# Patient Record
Sex: Female | Born: 1955 | Race: Black or African American | Hispanic: No | Marital: Married | Smoking: Never smoker
Health system: Southern US, Community
[De-identification: ages and names within clinical notes are randomized; demographics above are authoritative.]

## PROBLEM LIST (undated history)

## (undated) ENCOUNTER — Emergency Department (HOSPITAL_COMMUNITY): Disposition: A | Discharge status: Home / Self Care | Payer: Medicare Other

## (undated) ENCOUNTER — Emergency Department (HOSPITAL_COMMUNITY): Admission: EM | Discharge status: Absent without leave | Payer: Medicare Other

## (undated) DIAGNOSIS — F1010 Alcohol abuse, uncomplicated: Secondary | ICD-10-CM

## (undated) DIAGNOSIS — E785 Hyperlipidemia, unspecified: Secondary | ICD-10-CM

## (undated) DIAGNOSIS — E039 Hypothyroidism, unspecified: Secondary | ICD-10-CM

## (undated) DIAGNOSIS — I10 Essential (primary) hypertension: Secondary | ICD-10-CM

## (undated) HISTORY — PX: ABDOMINAL HYSTERECTOMY: SHX81

---
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
## 2001-02-03 ENCOUNTER — Encounter: Payer: Self-pay | Admitting: *Deleted

## 2001-02-03 ENCOUNTER — Emergency Department (HOSPITAL_COMMUNITY): Admission: EM | Admit: 2001-02-03 | Discharge: 2001-02-03 | Payer: Self-pay | Admitting: *Deleted

## 2001-05-09 ENCOUNTER — Emergency Department (HOSPITAL_COMMUNITY): Admission: EM | Admit: 2001-05-09 | Discharge: 2001-05-09 | Payer: Self-pay | Admitting: Emergency Medicine

## 2001-06-07 ENCOUNTER — Encounter: Payer: Self-pay | Admitting: Emergency Medicine

## 2001-06-07 ENCOUNTER — Emergency Department (HOSPITAL_COMMUNITY): Admission: EM | Admit: 2001-06-07 | Discharge: 2001-06-07 | Payer: Self-pay | Admitting: Emergency Medicine

## 2003-10-15 ENCOUNTER — Emergency Department (HOSPITAL_COMMUNITY): Admission: EM | Admit: 2003-10-15 | Discharge: 2003-10-15 | Payer: Self-pay | Admitting: Emergency Medicine

## 2005-07-04 ENCOUNTER — Emergency Department (HOSPITAL_COMMUNITY): Admission: EM | Admit: 2005-07-04 | Discharge: 2005-07-05 | Payer: Self-pay | Admitting: Emergency Medicine

## 2006-05-10 ENCOUNTER — Emergency Department (HOSPITAL_COMMUNITY): Admission: EM | Admit: 2006-05-10 | Discharge: 2006-05-10 | Payer: Self-pay | Admitting: Emergency Medicine

## 2006-05-18 ENCOUNTER — Emergency Department (HOSPITAL_COMMUNITY): Admission: EM | Admit: 2006-05-18 | Discharge: 2006-05-18 | Payer: Self-pay | Admitting: Emergency Medicine

## 2006-09-24 ENCOUNTER — Emergency Department (HOSPITAL_COMMUNITY): Admission: EM | Admit: 2006-09-24 | Discharge: 2006-09-24 | Payer: Self-pay | Admitting: Emergency Medicine

## 2007-05-09 ENCOUNTER — Emergency Department (HOSPITAL_COMMUNITY): Admission: EM | Admit: 2007-05-09 | Discharge: 2007-05-09 | Payer: Self-pay | Admitting: Emergency Medicine

## 2007-12-25 ENCOUNTER — Emergency Department (HOSPITAL_COMMUNITY): Admission: EM | Admit: 2007-12-25 | Discharge: 2007-12-25 | Payer: Self-pay | Admitting: Emergency Medicine

## 2007-12-25 ENCOUNTER — Ambulatory Visit: Payer: Self-pay | Admitting: Gastroenterology

## 2010-12-12 ENCOUNTER — Emergency Department (HOSPITAL_COMMUNITY): Payer: Self-pay

## 2010-12-12 ENCOUNTER — Emergency Department (HOSPITAL_COMMUNITY)
Admission: EM | Admit: 2010-12-12 | Discharge: 2010-12-12 | Disposition: A | Discharge status: Home / Self Care | Payer: Self-pay | Attending: Emergency Medicine | Admitting: Emergency Medicine

## 2010-12-12 DIAGNOSIS — K2900 Acute gastritis without bleeding: Secondary | ICD-10-CM | POA: Insufficient documentation

## 2010-12-12 DIAGNOSIS — R109 Unspecified abdominal pain: Secondary | ICD-10-CM | POA: Insufficient documentation

## 2010-12-12 LAB — DIFFERENTIAL
Basophils Absolute: 0 10*3/uL (ref 0.0–0.1)
Eosinophils Absolute: 0.1 10*3/uL (ref 0.0–0.7)
Eosinophils Relative: 2 % (ref 0–5)
Monocytes Absolute: 0.3 10*3/uL (ref 0.1–1.0)

## 2010-12-12 LAB — COMPREHENSIVE METABOLIC PANEL
ALT: 14 U/L (ref 0–35)
CO2: 25 mEq/L (ref 19–32)
Calcium: 9.2 mg/dL (ref 8.4–10.5)
Creatinine, Ser: 0.73 mg/dL (ref 0.4–1.2)
GFR calc Af Amer: >60 mL/min (ref >60)
GFR calc non Af Amer: >60 mL/min (ref >60)
Glucose, Bld: 98 mg/dL (ref 70–99)
Sodium: 141 mEq/L (ref 135–145)
Total Protein: 7.7 g/dL (ref 6.0–8.3)

## 2010-12-12 LAB — CBC
MCHC: 32.6 g/dL (ref 30.0–36.0)
MCV: 91.4 fL (ref 78.0–100.0)
Platelets: 387 10*3/uL (ref 150–400)
RDW: 13.9 % (ref 11.5–15.5)
WBC: 4.9 10*3/uL (ref 4.0–10.5)

## 2010-12-12 LAB — LIPASE, BLOOD: Lipase: 18 U/L (ref 11–59)

## 2011-01-28 NOTE — Consult Note (Signed)
Judy Davis, Judy Davis             ACCOUNT NO.:  [id number]   MEDICAL RECORD NO.:  [id number]          PATIENT TYPE:  EMS   LOCATION:  ED                            FACILITY:  [facility]   PHYSICIAN:  Kassie Mends, M.D.      DATE OF BIRTH:  [birth year]   DATE OF CONSULTATION:  12/25/2007  DATE OF DISCHARGE:  12/25/2007                                 CONSULTATION   REFERRING PHYSICIAN:  Nicoletta Dress. Colon Branch, M.D.   REASON FOR CONSULTATION:  Blood coming out of the mouth.   HISTORY OF PRESENT ILLNESS:  Judy Davis is a 55 year old female who  has a significant past medical history of alcohol abuse.  She called EMS  because of blood was coming out of her nose and her mouth.  She told Dr.  Colon Branch that the bleeding began this afternoon.  She told me it began  this morning.  She told me that her husband called 9-1-1, but her  husband told me that he she called 9-1-1.  He said he left her round 2  p.m. and she was fine.  She states her last drink of alcohol was 2 days  ago and she only had 1 beer.  She denies any problems swallowing,  nausea, or vomiting.  She has had no abdominal pain.  She states she  does not use aspirin, BCs or Goody Powders.  She does not use ibuprofen,  Motrin or Aleve.  Her husband states she uses Alka-Seltzer two a day.  She has not had any black stools that looks tar or rectal bleeding or  diarrhea or constipation.   PAST MEDICAL HISTORY:  1. Hypertension.  2. Seizure disorder.   PAST SURGICAL HISTORY:  None.   ALLERGIES:  No known drug allergies.   MEDICATIONS:  None.   FAMILY HISTORY:  She denies any family history of colon cancer or colon  polyps.   SOCIAL HISTORY:  She is married to her husband for 5 years.  She  regularly consumes alcohol.   REVIEW OF SYSTEMS:  Per the HPI; otherwise all systems negative.   PHYSICAL EXAMINATION:  VITAL SIGNS:  Afebrile and stable.  GENERAL:  She is in no apparent distress, appears intoxicated.  HEENT:  She has had prior  trauma to her head.  She has blood in her nares and  around her mouth.  Her pupils are equal, react to light.  Her sclerae  are injected bilaterally.  Has no oral lesions.  She has poor dental  caries.  NECK:  Full range of motion.  No lymphadenopathy.  LUNGS:  Clear to auscultation bilaterally.  HEART:  Regular rhythm, no murmur,  normal S1-S2.  ABDOMEN:  Bowel sounds are present.  Soft, nontender,  nondistended.  No rebound or guarding.  EXTREMITIES:  Have no cyanosis  or edema.  NEUROLOGIC:  She has neurologic deficits.   LABORATORY DATA:  Hemoglobin 12.6, platelets 188, INR 1.2, BUN 5,  creatinine 0.54.  AST and ALT elevated.   ASSESSMENT:  Judy Davis is a 55 year old female who appears to have  epistaxis.  Her history is somewhat  unreliable because she appears  intoxicated.  Thank you for allowing me to see Ms. [facility]  consultation.  My recommendations follow.   RECOMMENDATIONS:  1. No acute need for upper endoscopy.  Her NG lavage showed no      evidence of bright red blood in her stomach.  2. Could consider an EGD next week just to clear her upper GI tract.  3. Her liver enzymes are likely secondary to alcohol use.      Kassie Mends, M.D.  Electronically Signed     SM/MEDQ  D:  12/25/2007  T:  12/26/2007  Job:  [id number]

## 2011-06-10 LAB — APTT: aPTT: 30

## 2011-06-10 LAB — CBC
Platelets: 188
RDW: 15.3
WBC: 5.6

## 2011-06-10 LAB — RAPID URINE DRUG SCREEN, HOSP PERFORMED
Barbiturates: NOT DETECTED
Benzodiazepines: NOT DETECTED
Opiates: NOT DETECTED

## 2011-06-10 LAB — DIFFERENTIAL
Basophils Absolute: 0
Eosinophils Relative: 2
Lymphocytes Relative: 58 — ABNORMAL HIGH
Lymphs Abs: 3.3
Neutro Abs: 1.9
Neutrophils Relative %: 34 — ABNORMAL LOW

## 2011-06-10 LAB — URINALYSIS, ROUTINE W REFLEX MICROSCOPIC
Bilirubin Urine: NEGATIVE
Glucose, UA: NEGATIVE
Hgb urine dipstick: NEGATIVE
Protein, ur: NEGATIVE
Specific Gravity, Urine: <1.005 — ABNORMAL LOW

## 2011-06-10 LAB — ETHANOL: Alcohol, Ethyl (B): 439

## 2011-06-10 LAB — BASIC METABOLIC PANEL
BUN: 5 — ABNORMAL LOW
Calcium: 9.5
Creatinine, Ser: 0.54
GFR calc non Af Amer: >60
Glucose, Bld: 105 — ABNORMAL HIGH
Potassium: 3.5

## 2011-06-10 LAB — HEPATIC FUNCTION PANEL
ALT: 134 — ABNORMAL HIGH
AST: 419 — ABNORMAL HIGH
Albumin: 3.9
Alkaline Phosphatase: 69
Indirect Bilirubin: 0.4
Total Protein: 7.4

## 2011-06-10 LAB — TYPE AND SCREEN: Antibody Screen: NEGATIVE

## 2011-06-17 ENCOUNTER — Other Ambulatory Visit (HOSPITAL_COMMUNITY): Payer: Self-pay | Admitting: Family Medicine

## 2011-06-17 DIAGNOSIS — Z139 Encounter for screening, unspecified: Secondary | ICD-10-CM

## 2011-06-24 ENCOUNTER — Ambulatory Visit (HOSPITAL_COMMUNITY)
Admission: RE | Admit: 2011-06-24 | Discharge: 2011-06-24 | Disposition: A | Discharge status: Home / Self Care | Payer: PRIVATE HEALTH INSURANCE | Source: Ambulatory Visit | Attending: Family Medicine | Admitting: Family Medicine

## 2011-06-24 DIAGNOSIS — Z1231 Encounter for screening mammogram for malignant neoplasm of breast: Secondary | ICD-10-CM | POA: Insufficient documentation

## 2011-06-24 DIAGNOSIS — Z139 Encounter for screening, unspecified: Secondary | ICD-10-CM

## 2011-06-27 LAB — DIFFERENTIAL
Basophils Absolute: 0
Eosinophils Relative: 0
Lymphocytes Relative: 12
Neutro Abs: 4.2
Neutrophils Relative %: 81 — ABNORMAL HIGH

## 2011-06-27 LAB — BASIC METABOLIC PANEL
BUN: 4 — ABNORMAL LOW
Calcium: 9.5
Creatinine, Ser: 0.8
GFR calc non Af Amer: >60
Glucose, Bld: 182 — ABNORMAL HIGH

## 2011-06-27 LAB — COMPREHENSIVE METABOLIC PANEL
Albumin: 4.3
Alkaline Phosphatase: 83
BUN: 4 — ABNORMAL LOW
Potassium: 3.3 — ABNORMAL LOW
Sodium: 128 — ABNORMAL LOW
Total Protein: 8.1

## 2011-06-27 LAB — URINE MICROSCOPIC-ADD ON

## 2011-06-27 LAB — URINALYSIS, ROUTINE W REFLEX MICROSCOPIC
Glucose, UA: NEGATIVE
Leukocytes, UA: NEGATIVE
pH: 6

## 2011-06-27 LAB — ETHANOL: Alcohol, Ethyl (B): <5

## 2011-06-27 LAB — CBC
Platelets: 293
RDW: 14.8 — ABNORMAL HIGH

## 2011-07-05 ENCOUNTER — Other Ambulatory Visit: Payer: Self-pay

## 2011-07-05 ENCOUNTER — Encounter: Payer: Self-pay | Admitting: *Deleted

## 2011-07-05 ENCOUNTER — Emergency Department (HOSPITAL_COMMUNITY): Payer: Self-pay

## 2011-07-05 ENCOUNTER — Inpatient Hospital Stay (HOSPITAL_COMMUNITY)
Admission: EM | Admit: 2011-07-05 | Discharge: 2011-07-08 | DRG: 392 | Disposition: A | Discharge status: Home / Self Care | Payer: Self-pay | Attending: Internal Medicine | Admitting: Internal Medicine

## 2011-07-05 DIAGNOSIS — K2900 Acute gastritis without bleeding: Secondary | ICD-10-CM | POA: Diagnosis present

## 2011-07-05 DIAGNOSIS — D649 Anemia, unspecified: Secondary | ICD-10-CM | POA: Diagnosis present

## 2011-07-05 DIAGNOSIS — R112 Nausea with vomiting, unspecified: Secondary | ICD-10-CM | POA: Diagnosis present

## 2011-07-05 DIAGNOSIS — E871 Hypo-osmolality and hyponatremia: Secondary | ICD-10-CM | POA: Diagnosis present

## 2011-07-05 DIAGNOSIS — J069 Acute upper respiratory infection, unspecified: Secondary | ICD-10-CM | POA: Diagnosis present

## 2011-07-05 DIAGNOSIS — E236 Other disorders of pituitary gland: Secondary | ICD-10-CM | POA: Diagnosis present

## 2011-07-05 DIAGNOSIS — A088 Other specified intestinal infections: Principal | ICD-10-CM | POA: Diagnosis present

## 2011-07-05 DIAGNOSIS — T373X5A Adverse effect of other antiprotozoal drugs, initial encounter: Secondary | ICD-10-CM | POA: Diagnosis present

## 2011-07-05 DIAGNOSIS — I10 Essential (primary) hypertension: Secondary | ICD-10-CM | POA: Diagnosis present

## 2011-07-05 DIAGNOSIS — N390 Urinary tract infection, site not specified: Secondary | ICD-10-CM | POA: Diagnosis present

## 2011-07-05 HISTORY — DX: Essential (primary) hypertension: I10

## 2011-07-05 LAB — COMPREHENSIVE METABOLIC PANEL
BUN: 11 mg/dL (ref 6–23)
CO2: 25 mEq/L (ref 19–32)
Calcium: 9.3 mg/dL (ref 8.4–10.5)
Creatinine, Ser: 0.81 mg/dL (ref 0.50–1.10)
GFR calc Af Amer: >90 mL/min (ref >90)
GFR calc non Af Amer: 80 mL/min — ABNORMAL LOW (ref >90)
Glucose, Bld: 115 mg/dL — ABNORMAL HIGH (ref 70–99)
Total Protein: 8 g/dL (ref 6.0–8.3)

## 2011-07-05 LAB — URINE MICROSCOPIC-ADD ON

## 2011-07-05 LAB — CBC
HCT: 35.1 % — ABNORMAL LOW (ref 36.0–46.0)
MCH: 30.7 pg (ref 26.0–34.0)
MCV: 92.1 fL (ref 78.0–100.0)
Platelets: 294 10*3/uL (ref 150–400)
RBC: 3.81 MIL/uL — ABNORMAL LOW (ref 3.87–5.11)
WBC: 5.6 10*3/uL (ref 4.0–10.5)

## 2011-07-05 LAB — URINALYSIS, ROUTINE W REFLEX MICROSCOPIC
Hgb urine dipstick: NEGATIVE
Leukocytes, UA: NEGATIVE
Nitrite: NEGATIVE
Protein, ur: 30 mg/dL — AB
Specific Gravity, Urine: >1.03 — ABNORMAL HIGH (ref 1.005–1.030)
Urobilinogen, UA: 0.2 mg/dL (ref 0.0–1.0)

## 2011-07-05 LAB — DIFFERENTIAL
Eosinophils Absolute: 0 10*3/uL (ref 0.0–0.7)
Eosinophils Relative: 0 % (ref 0–5)
Lymphocytes Relative: 24 % (ref 12–46)
Lymphs Abs: 1.3 10*3/uL (ref 0.7–4.0)
Monocytes Absolute: 0.8 10*3/uL (ref 0.1–1.0)

## 2011-07-05 LAB — LIPASE, BLOOD: Lipase: 24 U/L (ref 11–59)

## 2011-07-05 MED ORDER — ACETAMINOPHEN 650 MG RE SUPP: 650.0000 mg | Freq: Four times a day (QID) | RECTAL | Status: DC | PRN

## 2011-07-05 MED ORDER — PANTOPRAZOLE SODIUM 40 MG IV SOLR
40.0000 mg | INTRAVENOUS | Status: DC
  Administered 2011-07-06: 40 mg via INTRAVENOUS
  Filled 2011-07-05: qty 40

## 2011-07-05 MED ORDER — ENOXAPARIN SODIUM 40 MG/0.4ML ~~LOC~~ SOLN
40.0000 mg | SUBCUTANEOUS | Status: DC
  Administered 2011-07-06 – 2011-07-07 (×2): 40 mg via SUBCUTANEOUS
  Filled 2011-07-05 (×2): qty 0.4

## 2011-07-05 MED ORDER — KCL IN DEXTROSE-NACL 20-5-0.9 MEQ/L-%-% IV SOLN
INTRAVENOUS | Status: DC
  Administered 2011-07-05: 20:00:00 via INTRAVENOUS
  Administered 2011-07-06: 1000 mL via INTRAVENOUS
  Administered 2011-07-06: 12:00:00 via INTRAVENOUS
  Filled 2011-07-05 (×6): qty 1000

## 2011-07-05 MED ORDER — ONDANSETRON HCL 4 MG/2ML IJ SOLN
4.0000 mg | Freq: Once | INTRAMUSCULAR | Status: AC
  Administered 2011-07-05: 4 mg via INTRAVENOUS
  Filled 2011-07-05: qty 2

## 2011-07-05 MED ORDER — ALUM & MAG HYDROXIDE-SIMETH 200-200-20 MG/5ML PO SUSP: 30.0000 mL | Freq: Four times a day (QID) | ORAL | Status: DC | PRN

## 2011-07-05 MED ORDER — MORPHINE SULFATE 2 MG/ML IJ SOLN: 2.0000 mg | INTRAMUSCULAR | Status: DC | PRN

## 2011-07-05 MED ORDER — PROMETHAZINE HCL 25 MG/ML IJ SOLN: 12.5000 mg | Freq: Four times a day (QID) | INTRAMUSCULAR | Status: DC | PRN

## 2011-07-05 MED ORDER — ONDANSETRON HCL 4 MG PO TABS: 4.0000 mg | ORAL_TABLET | Freq: Four times a day (QID) | ORAL | Status: DC | PRN

## 2011-07-05 MED ORDER — GUAIFENESIN-DM 100-10 MG/5ML PO SYRP: 5.0000 mL | ORAL_SOLUTION | ORAL | Status: DC | PRN

## 2011-07-05 MED ORDER — POTASSIUM CHLORIDE 2 MEQ/ML IV SOLN: INTRAVENOUS | Status: DC

## 2011-07-05 MED ORDER — PROMETHAZINE HCL 12.5 MG PO TABS: 12.5000 mg | ORAL_TABLET | Freq: Four times a day (QID) | ORAL | Status: DC | PRN

## 2011-07-05 MED ORDER — SODIUM CHLORIDE 0.9 % IV BOLUS (SEPSIS)
500.0000 mL | Freq: Once | INTRAVENOUS | Status: AC
  Administered 2011-07-05: 1000 mL via INTRAVENOUS

## 2011-07-05 MED ORDER — ACETAMINOPHEN 325 MG PO TABS
650.0000 mg | ORAL_TABLET | Freq: Four times a day (QID) | ORAL | Status: DC | PRN
  Administered 2011-07-05 – 2011-07-06 (×2): 650 mg via ORAL
  Filled 2011-07-05 (×2): qty 2

## 2011-07-05 MED ORDER — ALBUTEROL SULFATE (5 MG/ML) 0.5% IN NEBU: 2.5000 mg | INHALATION_SOLUTION | RESPIRATORY_TRACT | Status: DC | PRN

## 2011-07-05 MED ORDER — ONDANSETRON HCL 4 MG/2ML IJ SOLN: 4.0000 mg | Freq: Four times a day (QID) | INTRAMUSCULAR | Status: DC | PRN

## 2011-07-05 MED ORDER — THIAMINE HCL 100 MG/ML IJ SOLN
100.0000 mg | Freq: Every day | INTRAMUSCULAR | Status: DC
  Administered 2011-07-06 – 2011-07-08 (×3): 100 mg via INTRAVENOUS
  Filled 2011-07-05 (×3): qty 2

## 2011-07-05 NOTE — ED Notes (Signed)
Dr. Sherrie Mustache here to evaluate pt for admission

## 2011-07-05 NOTE — ED Notes (Signed)
Pt c/o n/v and weakness x 3 days

## 2011-07-05 NOTE — ED Notes (Signed)
Floor unable to take report, will return call when able  

## 2011-07-05 NOTE — H&P (Signed)
Judy Davis MRN: [id number] DOB/AGE: November 23, 1955 55 y.o. Primary Care Physician:[facility]. Admit date: 07/05/2011 Chief Complaint: Nausea, vomiting, and productive cough. HPI: The patient is a 55 year old woman with a past medical history significant for hypertension, who presents to the emergency department with a chief complaint of nausea, vomiting, and a productive cough. Her symptoms started approximately 3-4 days ago. She began having nausea and vomiting. Over the past 24 hours, she estimates that she has vomited approximately 15 times. The nausea is sometimes preceded by a dry and intermittent productive cough with white sputum. She denies pleurisy and shortness of breath. She denies coffee grounds emesis. She denies seeing blood in her sputum. She had loose stools on 2 occasions, but no ongoing diarrhea. She denies black tarry stools and bright red blood per rectum. She did have mild left lower quadrant and pelvic  abdominal pain for which she was treated with metronidazole. Last week, she completed a course of metronidazole for what sounds to be a diagnosis of bacterial vaginosis.. She has had no further unusual vaginal discharge. She had a hysterectomy in the 1970s. She denies any vaginal bleeding. She denies pain with urination. She has had a headache globally and subjective fever and chills. She drinks beer 2-3 times a week, but none lately.  In the emergency department, she was initially febrile with a temperature of 100.5. Her temperature is now 98.6. She was mildly tachycardic, however, her heart rate has normalized. Her blood pressure is within normal limits. Her lab data are significant for a serum sodium of 124 and chloride of 90. Her white blood cell count is within normal limits at 5.6. Her hemoglobin is slightly low at 11.7. A urinalysis was not done. Her liver transaminases and lipase are within normal limits. Her chest x-ray reveals no acute cardiopulmonary  findings.  Past Medical History  Diagnosis Date  . HTN (hypertension)   . Bacterial vaginosis   . Alcohol use   . DUB (dysfunctional uterine bleeding) 1970's    S/p hysterectomy     Past Surgical History  Procedure Date  . Abdominal hysterectomy 1970's    Prior to Admission medications   Medication Sig Start Date End Date Taking? Authorizing Provider  ibuprofen (ADVIL,MOTRIN) 200 MG tablet Take 200-400 mg by mouth 2 (two) times daily as needed. For pain    Yes Historical Provider, MD  lisinopril-hydrochlorothiazide (PRINZIDE,ZESTORETIC) 20-25 MG per tablet Take 1 tablet by mouth daily.     Yes Historical Provider, MD    Allergies: No Known Allergies  Family history: Her mother was stabbed to death. Her father died of colon cancer.  Social History: She is married. She lives in [place]. She has 2 adult children. She does not work. She completed high school. She denies tobacco use. She denies illicit drug use. She drinks 40 ounces of beer 2-3 days weekly.   ROS: As above in the history of present illness, otherwise negative.  PHYSICAL EXAM: Blood pressure 132/92, pulse 81, temperature 100.5 F (38.1 C), temperature source Oral, resp. rate 18, height 5' 6.5" (1.689 m), weight 58.968 kg (130 lb), SpO2 98.00%. General: The patient is a 55 year old African American woman who is currently lying in bed in no acute distress. She does appear ill, however. HEENT: Head is normocephalic nontraumatic. Pupils are equal, round, and reactive to light. Extraocular movements are intact. Conjunctivae are mildly injected. Sclerae are muddy. Tympanic membranes not examined. Oropharynx reveals mildly dry mucous membranes. Multiple missing teeth. No posterior exudates  or erythema. Nasal mucosa is moist and she has active rhinorrhea, but no sinus tenderness. Neck: Supple, no adenopathy, no thyromegaly, no JVD. Lungs: Decreased breath sounds in the bases, otherwise clear. Breathing is  nonlabored. Heart: S1, S2, no murmurs rubs or gallops. Abdomen: Mildly obese, positive bowel sounds, very mild tenderness in the epigastrium, no rebound, no guarding, no hepatosplenomegaly. GU and rectal are deferred. Extremities: Pedal pulses palpable. No pedal edema. Neurologic/psychological: She is alert and oriented x3. She appears ill, but is cooperative. Her speech is clear. Cranial nerves II through XII are grossly intact. Strength is 5 over 5 globally. Strength is intact globally.  Basic Metabolic Panel:  Basename 07/05/11 1520  NA 124*  K 4.5  CL 90*  CO2 25  GLUCOSE 115*  BUN 11  CREATININE 0.81  CALCIUM 9.3  MG --  PHOS --   Liver Function Tests:  Basename 07/05/11 1520  AST 30  ALT 17  ALKPHOS 53  BILITOT 0.4  PROT 8.0  ALBUMIN 3.9    Basename 07/05/11 1520  LIPASE 24  AMYLASE --   No results found for this basename: AMMONIA:2 in the last 72 hours CBC:  Basename 07/05/11 1520  WBC 5.6  NEUTROABS 3.4  HGB 11.7*  HCT 35.1*  MCV 92.1  PLT 294   Cardiac Enzymes: No results found for this basename: CKTOTAL:3,CKMB:3,CKMBINDEX:3,TROPONINI:3 in the last 72 hours BNP: No results found for this basename: POCBNP:3 in the last 72 hours D-Dimer: No results found for this basename: DDIMER:2 in the last 72 hours CBG: No results found for this basename: GLUCAP:6 in the last 72 hours Hemoglobin A1C: No results found for this basename: HGBA1C in the last 72 hours Fasting Lipid Panel: No results found for this basename: CHOL,HDL,LDLCALC,TRIG,CHOLHDL,LDLDIRECT in the last 72 hours Thyroid Function Tests: No results found for this basename: TSH,T4TOTAL,FREET4,T3FREE,THYROIDAB in the last 72 hours Anemia Panel: No results found for this basename: VITAMINB12,FOLATE,FERRITIN,TIBC,IRON,RETICCTPCT in the last 72 hours Urine Drug Screen:  Alcohol Level: No results found for this basename: ETH:2 in the last 72 hours    EKG: Normal sinus rhythm with a heart rate  80 beats per minute.  No results found for this or any previous visit (from the past 240 hour(s)).   Results for orders placed during the hospital encounter of 07/05/11 (from the past 48 hour(s))  CBC     Status: Abnormal   Collection Time   07/05/11  3:20 PM      Component Value Range Comment   WBC 5.6  4.0 - 10.5 (K/uL)    RBC 3.81 (*) 3.87 - 5.11 (MIL/uL)    Hemoglobin 11.7 (*) 12.0 - 15.0 (g/dL)    HCT 21.3 (*) 08.6 - 46.0 (%)    MCV 92.1  78.0 - 100.0 (fL)    MCH 30.7  26.0 - 34.0 (pg)    MCHC 33.3  30.0 - 36.0 (g/dL)    RDW 57.8  46.9 - 62.9 (%)    Platelets 294  150 - 400 (K/uL)   DIFFERENTIAL     Status: Abnormal   Collection Time   07/05/11  3:20 PM      Component Value Range Comment   Neutrophils Relative 61  43 - 77 (%)    Neutro Abs 3.4  1.7 - 7.7 (K/uL)    Lymphocytes Relative 24  12 - 46 (%)    Lymphs Abs 1.3  0.7 - 4.0 (K/uL)    Monocytes Relative 15 (*) 3 - 12 (%)  Monocytes Absolute 0.8  0.1 - 1.0 (K/uL)    Eosinophils Relative 0  0 - 5 (%)    Eosinophils Absolute 0.0  0.0 - 0.7 (K/uL)    Basophils Relative 0  0 - 1 (%)    Basophils Absolute 0.0  0.0 - 0.1 (K/uL)   COMPREHENSIVE METABOLIC PANEL     Status: Abnormal   Collection Time   07/05/11  3:20 PM      Component Value Range Comment   Sodium 124 (*) 135 - 145 (mEq/L)    Potassium 4.5  3.5 - 5.1 (mEq/L)    Chloride 90 (*) 96 - 112 (mEq/L)    CO2 25  19 - 32 (mEq/L)    Glucose, Bld 115 (*) 70 - 99 (mg/dL)    BUN 11  6 - 23 (mg/dL)    Creatinine, Ser 1.61  0.50 - 1.10 (mg/dL)    Calcium 9.3  8.4 - 10.5 (mg/dL)    Total Protein 8.0  6.0 - 8.3 (g/dL)    Albumin 3.9  3.5 - 5.2 (g/dL)    AST 30  0 - 37 (U/L)    ALT 17  0 - 35 (U/L)    Alkaline Phosphatase 53  39 - 117 (U/L)    Total Bilirubin 0.4  0.3 - 1.2 (mg/dL)    GFR calc non Af Amer 80 (*) >90 (mL/min)    GFR calc Af Amer >90  >90 (mL/min)   LIPASE, BLOOD     Status: Normal   Collection Time   07/05/11  3:20 PM      Component Value Range  Comment   Lipase 24  11 - 59 (U/L)     Dg Chest 2 View  07/05/2011  *RADIOLOGY REPORT*  Clinical Data: Cough and congestion for 3 days.  CHEST - 2 VIEW  Comparison: None  Findings: The cardiac silhouette, mediastinal and hilar contours are within normal limits.  The lungs are clear.  No pleural effusion.  The bony thorax is intact.  Remote healed left-sided rib fractures are noted.  IMPRESSION: No acute cardiopulmonary findings.  Original Report Authenticated By: P. Loralie Champagne, M.D.    Impression:  Active Problems:  Nausea and vomiting  Hyponatremia  HTN (hypertension)  Anemia   1. Nausea and vomiting. Possibly secondary to an acute viral gastroenteritis. We'll also consider side effect from metronidazole. Also consider acute gastritis given that she was recently started on ibuprofen. Her lipase and liver transaminases are within normal limits. She has minimal epigastric discomfort on exam.  Hyponatremia. Likely secondary to hypovolemia in the setting of chronic hydrochlorothiazide/lisinopril therapy and vomiting.  Transient fever. This will be followed.  History of hypertension. Currently stable.  Anemia.  He said treatment of what sounds to be bacterial vaginosis.  Alcohol use. I do believe the patient and she says that she drinks only 2-3 days per week. Her sister, who was present, concurs.   Plan:  1. Will start IV fluid hydration with D5 normal saline. Will give thiamine empirically, IV, over the next couple of days. We'll start when necessary antiemetics and analgesics. We'll start IV Protonix. We'll discontinue lisinopril/HCTZ and ibuprofen.  Will treat the patient's cough with as needed Robitussin and albuterol nebs.  We'll keep the patient virtually n.p.o., put we'll allow occasional ice chips and sips of ginger ale.  We'll check a urinalysis and urine culture; TSH; free T4; Pro BNP; etc.          Camaria Gerald 07/05/2011, 6:27 PM

## 2011-07-05 NOTE — ED Notes (Signed)
Pt c/o nausea has returned, Dr. Rubin Payor notified, additional orders given,pt and family updated on plan of care

## 2011-07-05 NOTE — ED Provider Notes (Signed)
History     CSN: [id number] Arrival date & time: 07/05/2011  1:44 PM   First MD Initiated Contact with Patient 07/05/11 1347      Chief Complaint  Patient presents with  . Sore Throat  . Emesis    (Consider location/radiation/quality/duration/timing/severity/associated sxs/prior treatment) Patient is a 55 y.o. female presenting with vomiting. The history is provided by the patient.  Emesis  The current episode started more than 2 days ago. The problem has not changed since onset.The emesis has an appearance of stomach contents. There has been no fever. Associated symptoms include abdominal pain, cough, diarrhea, headaches and myalgias. Pertinent negatives include no fever.   patient has had nausea and vomiting for 3 days. She states that she hurts all over also. She's had chills. She states she's been coughing up sputum also. She's also had a sore throat. No dysuria. She had a small amount of diarrhea she states compared to the vomiting. Mild upper abdominal pain. No sick contacts.  Past Medical History  Diagnosis Date  . HTN (hypertension)   . Bacterial vaginosis   . Alcohol use   . DUB (dysfunctional uterine bleeding) 1970's    S/p hysterectomy     Past Surgical History  Procedure Date  . Abdominal hysterectomy 1970's    History reviewed. No pertinent family history.  History  Substance Use Topics  . Smoking status: Never Smoker   . Smokeless tobacco: Not on file  . Alcohol Use: No    OB History    Grav Para Term Preterm Abortions TAB SAB Ect Mult Living                  Review of Systems  Constitutional: Negative for fever.  HENT: Positive for sore throat. Negative for voice change.   Respiratory: Positive for cough. Negative for wheezing.   Gastrointestinal: Positive for nausea, vomiting, abdominal pain and diarrhea.  Genitourinary: Negative for dysuria.  Musculoskeletal: Positive for myalgias. Negative for back pain.  Skin: Negative for rash.    Neurological: Positive for headaches. Negative for seizures.  Psychiatric/Behavioral: Negative for confusion.    Allergies  Review of patient's allergies indicates no known allergies.  Home Medications   Current Outpatient Rx  Name Route Sig Dispense Refill  . IBUPROFEN 200 MG PO TABS Oral Take 200-400 mg by mouth 2 (two) times daily as needed. For pain     . LISINOPRIL-HYDROCHLOROTHIAZIDE 20-25 MG PO TABS Oral Take 1 tablet by mouth daily.        BP 132/92  Pulse 81  Temp(Src) 100.5 F (38.1 C) (Oral)  Resp 18  Ht 5' 6.5" (1.689 m)  Wt 130 lb (58.968 kg)  BMI 20.67 kg/m2  SpO2 98%  Physical Exam  Nursing note and vitals reviewed. Constitutional: She is oriented to person, place, and time. She appears well-developed and well-nourished.  HENT:  Head: Normocephalic and atraumatic.       Mild posterior pharyngeal erythema without exudate  Eyes: EOM are normal. Pupils are equal, round, and reactive to light.  Neck: Normal range of motion. Neck supple.  Cardiovascular: Normal rate, regular rhythm and normal heart sounds.   No murmur heard. Pulmonary/Chest: Effort normal and breath sounds normal. No respiratory distress. She has no wheezes. She has no rales.  Abdominal: Soft. Bowel sounds are normal. She exhibits no distension. There is tenderness. There is no rebound and no guarding.       Mild upper abdominal tenderness without rebound or guarding.  Musculoskeletal: Normal  range of motion.  Lymphadenopathy:    She has cervical adenopathy.  Neurological: She is alert and oriented to person, place, and time. No cranial nerve deficit.  Skin: Skin is warm and dry.  Psychiatric: She has a normal mood and affect. Her speech is normal.    ED Course  Procedures (including critical care time)  Labs Reviewed  CBC - Abnormal; Notable for the following:    RBC 3.81 (*)    Hemoglobin 11.7 (*)    HCT 35.1 (*)    All other components within normal limits  DIFFERENTIAL -  Abnormal; Notable for the following:    Monocytes Relative 15 (*)    All other components within normal limits  COMPREHENSIVE METABOLIC PANEL - Abnormal; Notable for the following:    Sodium 124 (*)    Chloride 90 (*)    Glucose, Bld 115 (*)    GFR calc non Af Amer 80 (*)    All other components within normal limits  LIPASE, BLOOD  URINALYSIS, ROUTINE W REFLEX MICROSCOPIC  URINE CULTURE  BASIC METABOLIC PANEL  CBC  FERRITIN  VITAMIN B12  IRON AND TIBC  MAGNESIUM  TSH  T4, FREE  PRO B NATRIURETIC PEPTIDE   Dg Chest 2 View  07/05/2011  *RADIOLOGY REPORT*  Clinical Data: Cough and congestion for 3 days.  CHEST - 2 VIEW  Comparison: None  Findings: The cardiac silhouette, mediastinal and hilar contours are within normal limits.  The lungs are clear.  No pleural effusion.  The bony thorax is intact.  Remote healed left-sided rib fractures are noted.  IMPRESSION: No acute cardiopulmonary findings.  Original Report Authenticated By: P. Loralie Champagne, M.D.     1. Hyponatremia      Date: 07/05/2011  Rate: 80  Rhythm: normal sinus rhythm  QRS Axis: normal  Intervals: normal  ST/T Wave abnormalities: normal  Conduction Disutrbances:none  Narrative Interpretation:   Old EKG Reviewed: none available    MDM  Patient has had nausea and vomiting for the last 3 days. Some abdominal pain. She's also had a cough that she states is productive of sputum. She was found to have a hyponatremia of 124. She's continued vomiting will be admitted. No EKG changes.        Juliet Rude. Rubin Payor, MD 07/05/11 1900

## 2011-07-05 NOTE — ED Notes (Signed)
Pt states that she has been sick for a few days, cough, white sputum production, pt states that she has been throwing up phlegm,.

## 2011-07-06 DIAGNOSIS — J069 Acute upper respiratory infection, unspecified: Secondary | ICD-10-CM | POA: Diagnosis present

## 2011-07-06 DIAGNOSIS — N390 Urinary tract infection, site not specified: Secondary | ICD-10-CM | POA: Diagnosis present

## 2011-07-06 LAB — BASIC METABOLIC PANEL
CO2: 25 mEq/L (ref 19–32)
Calcium: 9 mg/dL (ref 8.4–10.5)
GFR calc non Af Amer: >90 mL/min (ref >90)
Potassium: 3.6 mEq/L (ref 3.5–5.1)
Sodium: 127 mEq/L — ABNORMAL LOW (ref 135–145)

## 2011-07-06 LAB — CBC
Hemoglobin: 12.4 g/dL (ref 12.0–15.0)
Platelets: 284 10*3/uL (ref 150–400)
RBC: 4.09 MIL/uL (ref 3.87–5.11)
WBC: 5 10*3/uL (ref 4.0–10.5)

## 2011-07-06 LAB — IRON AND TIBC
Iron: 43 ug/dL (ref 42–135)
Saturation Ratios: 17 % — ABNORMAL LOW (ref 20–55)
TIBC: 251 ug/dL (ref 250–470)

## 2011-07-06 LAB — FERRITIN: Ferritin: 440 ng/mL — ABNORMAL HIGH (ref 10–291)

## 2011-07-06 LAB — URIC ACID: Uric Acid, Serum: 3 mg/dL (ref 2.4–7.0)

## 2011-07-06 MED ORDER — ALBUTEROL SULFATE HFA 108 (90 BASE) MCG/ACT IN AERS
2.0000 | INHALATION_SPRAY | Freq: Two times a day (BID) | RESPIRATORY_TRACT | Status: DC
  Administered 2011-07-06 – 2011-07-08 (×4): 2 via RESPIRATORY_TRACT
  Filled 2011-07-06: qty 6.7

## 2011-07-06 MED ORDER — SODIUM CHLORIDE 0.9 % IJ SOLN
INTRAMUSCULAR | Status: AC
  Administered 2011-07-06: 10 mL
  Filled 2011-07-06: qty 10

## 2011-07-06 MED ORDER — BENZONATATE 100 MG PO CAPS
100.0000 mg | ORAL_CAPSULE | Freq: Three times a day (TID) | ORAL | Status: DC
  Administered 2011-07-06 – 2011-07-08 (×7): 100 mg via ORAL
  Filled 2011-07-06 (×7): qty 1

## 2011-07-06 MED ORDER — OXYMETAZOLINE HCL 0.05 % NA SOLN
1.0000 | Freq: Two times a day (BID) | NASAL | Status: DC
  Administered 2011-07-06 – 2011-07-08 (×5): 1 via NASAL
  Filled 2011-07-06 (×2): qty 15

## 2011-07-06 MED ORDER — LORATADINE 10 MG PO TABS
10.0000 mg | ORAL_TABLET | Freq: Every day | ORAL | Status: DC
  Administered 2011-07-06 – 2011-07-08 (×3): 10 mg via ORAL
  Filled 2011-07-06 (×3): qty 1

## 2011-07-06 MED ORDER — DEXTROSE 5 % IV SOLN
1.0000 g | INTRAVENOUS | Status: DC
  Administered 2011-07-06 – 2011-07-08 (×3): 1 g via INTRAVENOUS
  Filled 2011-07-06 (×4): qty 1

## 2011-07-06 MED ORDER — POTASSIUM CHLORIDE IN NACL 20-0.9 MEQ/L-% IV SOLN
INTRAVENOUS | Status: DC
  Administered 2011-07-06: 15:00:00 via INTRAVENOUS
  Administered 2011-07-07: 20 mL via INTRAVENOUS
  Administered 2011-07-07 (×2): 1000 mL via INTRAVENOUS

## 2011-07-06 NOTE — Progress Notes (Signed)
Subjective: She feels a little bit better, but continues to have a productive cough and now complains of nasal congestion. Her nausea and vomiting have subsided.  Objective: Vital signs in last 24 hours: Filed Vitals:   07/05/11 1948 07/05/11 2052 07/06/11 0600 07/06/11 0945  BP:   149/85   Pulse:   84 68  Temp:   97.9 F (36.6 C)   TempSrc:   Oral   Resp:   20 16  Height: 5\' 6"  (1.676 m)     Weight: 59.603 kg (131 lb 6.4 oz)     SpO2: 98% 98% 98% 98%    Intake/Output Summary (Last 24 hours) at 07/06/11 1322 Last data filed at 07/06/11 0600  Gross per 24 hour  Intake   1381 ml  Output    850 ml  Net    531 ml    Weight change:   Exam: Face: Nasal rhinorrhea. Lungs: Rare wheeze, otherwise mostly clear. Heart: S1, S2, with a soft systolic murmur. Abdomen: Positive bowel sounds, nontender, non-distended. Extremities: No pedal edema.  Lab Results: Basic Metabolic Panel:  Basename 07/06/11 0735 07/05/11 1520  NA 127* 124*  K 3.6 4.5  CL 93* 90*  CO2 25 25  GLUCOSE 129* 115*  BUN 5* 11  CREATININE 0.62 0.81  CALCIUM 9.0 9.3  MG 1.9 --  PHOS -- --   Liver Function Tests:  Basename 07/05/11 1520  AST 30  ALT 17  ALKPHOS 53  BILITOT 0.4  PROT 8.0  ALBUMIN 3.9    Basename 07/05/11 1520  LIPASE 24  AMYLASE --   No results found for this basename: AMMONIA:2 in the last 72 hours CBC:  Basename 07/06/11 0735 07/05/11 1520  WBC 5.0 5.6  NEUTROABS -- 3.4  HGB 12.4 11.7*  HCT 38.4 35.1*  MCV 93.9 92.1  PLT 284 294   Cardiac Enzymes: No results found for this basename: CKTOTAL:3,CKMB:3,CKMBINDEX:3,TROPONINI:3 in the last 72 hours BNP:  Basename 07/05/11 1900  POCBNP 29.7   D-Dimer: No results found for this basename: DDIMER:2 in the last 72 hours CBG: No results found for this basename: GLUCAP:6 in the last 72 hours Hemoglobin A1C: No results found for this basename: HGBA1C in the last 72 hours Fasting Lipid Panel: No results found for this  basename: CHOL,HDL,LDLCALC,TRIG,CHOLHDL,LDLDIRECT in the last 72 hours Thyroid Function Tests: No results found for this basename: TSH,T4TOTAL,FREET4,T3FREE,THYROIDAB in the last 72 hours Anemia Panel: No results found for this basename: VITAMINB12,FOLATE,FERRITIN,TIBC,IRON,RETICCTPCT in the last 72 hours Urine Drug Screen:  Alcohol Level: No results found for this basename: ETH:2 in the last 72 hours Urinalysis: Urine appearance clear, urine specific gravity greater than 1.030, urine glucose negative, urine ketones negative, urine protein 30, nitrite negative, 7-10 WBCs, few squamous cells, few bacteria, and WBC casts.    Micro: No results found for this or any previous visit (from the past 240 hour(s)).  Studies/Results: Dg Chest 2 View  07/05/2011  *RADIOLOGY REPORT*  Clinical Data: Cough and congestion for 3 days.  CHEST - 2 VIEW  Comparison: None  Findings: The cardiac silhouette, mediastinal and hilar contours are within normal limits.  The lungs are clear.  No pleural effusion.  The bony thorax is intact.  Remote healed left-sided rib fractures are noted.  IMPRESSION: No acute cardiopulmonary findings.  Original Report Authenticated By: P. Loralie Champagne, M.D.    Medications: I have reviewed the patient's current medications.  Assessment: Active Problems:  Nausea and vomiting  Hyponatremia  HTN (hypertension)  Anemia  Upper respiratory infection  1. Nausea and vomiting, possibly secondary to an acute gastroenteritis versus side effect from metronidazole versus gastritis from ibuprofen versus urinary tract infection. Her nausea and vomiting have subsided on Protonix and supportive treatment.  Hyponatremia. Likely secondary to hypovolemia in the setting of chronic hydrochlorothiazide/lisinopril therapy and vomiting. Her urine specific gravity was noted to be elevated.  Urinary tract infection.  Upper respiratory infection.  Hypertension. We will hold off on starting  medication treatment for now.  Anemia. Studies ordered.      Plan:  1. Start Rocephin. Continue IV fluid hydration.  Start symptomatic treatment with twice a day albuterol inhaler, Tessalon Perles, Claritin, and Afrin nasal spray.  Advance diet to clear liquids.  Check anemia studies, TSH, and free T4 pending.    LOS: 1 day   Dallyn Bergland 07/06/2011, 1:22 PM

## 2011-07-06 NOTE — Progress Notes (Signed)
Patient on tele, Dr. Sherrie Mustache called and said she could be discontinued from tele.

## 2011-07-07 LAB — URINE CULTURE

## 2011-07-07 LAB — BASIC METABOLIC PANEL
BUN: 3 mg/dL — ABNORMAL LOW (ref 6–23)
CO2: 25 mEq/L (ref 19–32)
Chloride: 94 mEq/L — ABNORMAL LOW (ref 96–112)
Creatinine, Ser: 0.63 mg/dL (ref 0.50–1.10)
Potassium: 4.1 mEq/L (ref 3.5–5.1)

## 2011-07-07 MED ORDER — FUROSEMIDE 10 MG/ML IJ SOLN
10.0000 mg | Freq: Two times a day (BID) | INTRAMUSCULAR | Status: AC
  Administered 2011-07-07 – 2011-07-08 (×2): 10 mg via INTRAVENOUS
  Filled 2011-07-07 (×2): qty 2

## 2011-07-07 MED ORDER — PANTOPRAZOLE SODIUM 40 MG PO TBEC
40.0000 mg | DELAYED_RELEASE_TABLET | Freq: Every day | ORAL | Status: DC
  Administered 2011-07-07 – 2011-07-08 (×2): 40 mg via ORAL
  Filled 2011-07-07 (×2): qty 1

## 2011-07-07 NOTE — Progress Notes (Signed)
Subjective:  The patient's upper respiratory symptoms have subsided. She has no further nausea or vomiting. She wants to try to eat solid foods.  Objective: Vital signs in last 24 hours: Filed Vitals:   07/06/11 2011 07/06/11 2044 07/07/11 0600 07/07/11 0744  BP: 125/73  124/81   Pulse: 80  74   Temp: 98.9 F (37.2 C)  98.1 F (36.7 C)   TempSrc: Oral  Oral   Resp: 20  20   Height:      Weight:      SpO2: 98% 98% 99% 97%    Intake/Output Summary (Last 24 hours) at 07/07/11 1312 Last data filed at 07/07/11 0700  Gross per 24 hour  Intake      0 ml  Output   1500 ml  Net  -1500 ml    Weight change:   Exam: Face: Less rhinorrhea. Lungs: Clear to auscultation bilaterally. Heart: S1, S2, with a soft systolic murmur. Abdomen: Positive bowel sounds, nontender, non-distended. Extremities: No pedal edema.  Lab Results: Basic Metabolic Panel:  Basename 07/07/11 0607 07/06/11 0735  NA 127* 127*  K 4.1 3.6  CL 94* 93*  CO2 25 25  GLUCOSE 110* 129*  BUN 3* 5*  CREATININE 0.63 0.62  CALCIUM 9.1 9.0  MG -- 1.9  PHOS -- --   Liver Function Tests:  Basename 07/05/11 1520  AST 30  ALT 17  ALKPHOS 53  BILITOT 0.4  PROT 8.0  ALBUMIN 3.9    Basename 07/05/11 1520  LIPASE 24  AMYLASE --   No results found for this basename: AMMONIA:2 in the last 72 hours CBC:  Basename 07/06/11 0735 07/05/11 1520  WBC 5.0 5.6  NEUTROABS -- 3.4  HGB 12.4 11.7*  HCT 38.4 35.1*  MCV 93.9 92.1  PLT 284 294   Cardiac Enzymes: No results found for this basename: CKTOTAL:3,CKMB:3,CKMBINDEX:3,TROPONINI:3 in the last 72 hours BNP:  Basename 07/05/11 1900  POCBNP 29.7   D-Dimer: No results found for this basename: DDIMER:2 in the last 72 hours CBG: No results found for this basename: GLUCAP:6 in the last 72 hours Hemoglobin A1C: No results found for this basename: HGBA1C in the last 72 hours Fasting Lipid Panel: No results found for this basename:  CHOL,HDL,LDLCALC,TRIG,CHOLHDL,LDLDIRECT in the last 72 hours Thyroid Function Tests:  [facility] 07/06/11 0735  TSH 4.692*  T4TOTAL --  FREET4 1.15  T3FREE --  THYROIDAB --   Anemia Panel:  Basename 07/06/11 0735  VITAMINB12 1196*  FOLATE --  FERRITIN 440*  TIBC 251  IRON 43  RETICCTPCT --   Urine Drug Screen:  Alcohol Level: No results found for this basename: ETH:2 in the last 72 hours Urinalysis: Urine appearance clear, urine specific gravity greater than 1.030, urine glucose negative, urine ketones negative, urine protein 30, nitrite negative, 7-10 WBCs, few squamous cells, few bacteria, and WBC casts.    Micro: No results found for this or any previous visit (from the past 240 hour(s)).  Studies/Results: Dg Chest 2 View  07/05/2011  *RADIOLOGY REPORT*  Clinical Data: Cough and congestion for 3 days.  CHEST - 2 VIEW  Comparison: None  Findings: The cardiac silhouette, mediastinal and hilar contours are within normal limits.  The lungs are clear.  No pleural effusion.  The bony thorax is intact.  Remote healed left-sided rib fractures are noted.  IMPRESSION: No acute cardiopulmonary findings.  Original Report Authenticated By: P. Loralie Champagne, M.D.    Medications: I have reviewed the patient's current medications.  Assessment: Active Problems:  Nausea and vomiting  Hyponatremia  HTN (hypertension)  Anemia  Upper respiratory infection  UTI (lower urinary tract infection)  1. Nausea and vomiting, possibly secondary to an acute gastroenteritis versus side effect from metronidazole versus gastritis from ibuprofen versus urinary tract infection. Her nausea and vomiting have subsided on Protonix and supportive treatment.  Hyponatremia. Initially, it appeared that the patient had hyponatremia secondary to hypovolemia in the setting of chronic hydrochlorothiazide/lisinopril therapy and vomiting. After 48 hours of IV fluids with normal saline, her serum sodium has only  increased marginally. Her uric acid level is less than 4 and her random urine sodium is greater than 100. 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  Urinary tract infection. On Rocephin.  Upper respiratory infection. Symptoms much improved on supportive treatment as ordered yesterday.  Hypertension. Stable off of antihypertensives.  Anemia. Studies unremarkable. No obvious deficiencies. TSH is slightly elevated however her free T4 is within normal limits the      Plan:  Decrease IV fluids. Trial of gentle IV Lasix. We'll follow her serum sodium tomorrow. We'll advance her diet. The patient was advised to increase her activity. Possible discharge home tomorrow.    LOS: 2 days   Momina Hunton 07/07/2011, 1:12 PM

## 2011-07-08 LAB — BASIC METABOLIC PANEL
CO2: 27 mEq/L (ref 19–32)
Calcium: 10 mg/dL (ref 8.4–10.5)
Chloride: 89 mEq/L — ABNORMAL LOW (ref 96–112)
Glucose, Bld: 143 mg/dL — ABNORMAL HIGH (ref 70–99)
Sodium: 130 mEq/L — ABNORMAL LOW (ref 135–145)

## 2011-07-08 MED ORDER — BENZONATATE 100 MG PO CAPS: 100.0000 mg | ORAL_CAPSULE | Freq: Three times a day (TID) | ORAL | Status: AC | PRN

## 2011-07-08 MED ORDER — POTASSIUM CHLORIDE CRYS ER 20 MEQ PO TBCR
20.0000 meq | EXTENDED_RELEASE_TABLET | Freq: Every day | ORAL | Status: AC
  Administered 2011-07-08: 20 meq via ORAL
  Filled 2011-07-08: qty 1

## 2011-07-08 MED ORDER — OXYMETAZOLINE HCL 0.05 % NA SOLN: 1.0000 | Freq: Two times a day (BID) | NASAL | Status: AC

## 2011-07-08 MED ORDER — LORATADINE 10 MG PO TABS: 10.0000 mg | ORAL_TABLET | Freq: Every day | ORAL | Status: DC | PRN

## 2011-07-08 MED ORDER — CEFUROXIME AXETIL 500 MG PO TABS: 500.0000 mg | ORAL_TABLET | Freq: Two times a day (BID) | ORAL | Status: AC

## 2011-07-08 MED ORDER — LISINOPRIL 10 MG PO TABS: 10.0000 mg | ORAL_TABLET | Freq: Every day | ORAL | Status: DC

## 2011-07-08 MED ORDER — OMEPRAZOLE 20 MG PO CPDR: 20.0000 mg | DELAYED_RELEASE_CAPSULE | Freq: Every day | ORAL | Status: DC

## 2011-07-09 NOTE — Discharge Summary (Signed)
NAME:  Judy Davis, Judy Davis NO.:  [id number]  MEDICAL RECORD NO.:  [id number]  LOCATION:  A330                          FACILITY:  [facility]  PHYSICIAN:  Elliot Cousin, M.D.    DATE OF BIRTH:  22-Dec-1955  DATE OF ADMISSION:  07/05/2011 DATE OF DISCHARGE:  10/23/2012LH                         DISCHARGE SUMMARY-REFERRING   DISCHARGE DIAGNOSES: 1. Nausea and vomiting secondary to an acute viral gastroenteritis     with possible contribution from gastritis, urinary tract infection,     and side effects from metronidazole which was taken several days     prior to hospital admission. 2. Upper respiratory infection. 3. 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 4. Hypertension, stable off antihypertensive medications. 5. Normocytic anemia.  Her anemia panel was unremarkable with a total     iron of 43, TIBC of 251, ferritin of 440, and vitamin B12 of 1196. 6. Alcohol use.  DISCHARGE MEDICATIONS: 1. Tessalon Perles 100 mg 3 times daily as needed for cough. 2. Ceftin 500 mg b.i.d. until complete. 3. Lisinopril 10 mg daily. 4. Loratadine 10 mg daily as needed. 5. Omeprazole 20 mg daily. 6. Afrin nasal spray, 1 spray in each nostril twice daily as needed     for nasal congestion.  The patient was advised avoid using Afrin     nasal spray 5 days in a row to avoid rebound stiffness. 7. Stop ibuprofen. 8. Stop hydrochlorothiazide.  DISCHARGE DISPOSITION:  The patient was discharged home in improved and stable condition.  She will follow up with her Health Care provider at the [facility] on July 15, 2011.  The patient was instructed to ask her doctor to recheck her sodium level at her followup appointment.  She voiced understanding.  CONSULTATIONS:  None.  PROCEDURES PERFORMED:  None.  HISTORY OF PRESENTING ILLNESS:  The patient is a 55 year old woman with a past medical history significant for  hypertension, who presented to the emergency department on July 05, 2011, with a chief complaint of nausea, vomiting, and a productive cough.  In the emergency department, she was initially febrile with a temperature of 100.5, however, at the time of my assessment, she was afebrile with a temperature of 98.6.  She was mildly tachycardic.  Her blood pressure was within normal limits. Her lab data were significant for a serum sodium of 124 and a chloride of 90.  Her white blood cell count was within normal limits at 5.6.  Her hemoglobin was slightly low at 11.7.  Her liver transaminases and lipase were both within normal limits.  Her chest x-ray revealed no acute cardiopulmonary findings.  Urinalysis was not done.  She was admitted for further evaluation and management.  HOSPITAL COURSE:  The patient was started on IV fluids with normal saline given that her serum sodium was 124.  Because of her vomiting and chronic hydrochlorothiazide/lisinopril therapy for hypertension, it was presumed that her hyponatremia was secondary to hypovolemia with dehydration.  Supportive treatment was given with as-needed analgesics and as-needed antiemetics.  Ibuprofen was discontinued because acute gastritis was also considered as an etiology of her nausea and vomiting. She was started on IV  Protonix.  It was thought that she may have had an acute viral gastroenteritis given that her abdomen was virtually benign on exam and that her lipase and liver transaminases were within normal limits.  Also, she had recently completed treatment of what sounds to be bacterial vaginosis with metronidazole prescribed by her [facility] provider [facility].  Metronidazole can cause nausea. Nevertheless, a urinalysis was ordered to rule out infection.  The findings were consistent with infection.  She was started on intravenous Rocephin.  HCTZ and hydrochlorothiazide were discontinued during  the hospitalization.  Her blood pressure remained stable and relatively controlled.  During the first 24-36 hours of the hospitalization, she was made to n.p.o.  When her symptoms subsided, her diet was advanced to a clear liquid diet and then subsequently to a full liquid diet which she tolerated.  When her symptoms completely resolved, her diet was advanced to a regular diet which she tolerated with no ill effects.  After 2 1/2 days of IV fluids with normal saline, her sodium increased from 124 to only 127.  There was a suspicion of either polydipsia or SIADH.  When it did not appear that she had polydipsia, a number of studies were ordered.  Her uric acid level was low and a random urine sodium level was elevated.  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  Her TSH was slightly elevated at 4.6, however, her free T4 was within normal limits at 1.15.  Her pro-BNP was within normal limits at 30.  A trial of gentle IV Lasix was given for 2 doses.  Her serum sodium did improve to 130 following Lasix.  Her diet was therefore liberalized without sodium restriction.  Her serum potassium fell slightly because of Lasix.  She was given 30 mEq of potassium chloride prior to hospital discharge.  Lisinopril was restarted without hydrochlorothiazide.  Lisinopril will also help with the potassium deficit.  The patient developed upper respiratory infection symptoms several days prior to admission.  When her nausea and vomiting subsided, her symptoms became more evident.  She was started on symptomatic treatment with Afrin nasal spray, Tessalon Perles, loratadine, and an albuterol inhaler.  Following these measures, her upper respiratory symptoms subsided.  The patient was also noted to be anemic.  Her hemoglobin was 11.7 on admission.  Anemia studies were ordered.  The results are dictated above.  Surprisingly, her hemoglobin normalized to 12.4 prior to discharge.  Prior to discharge, she  was advised to stop taking hydrochlorothiazide. She was instructed to avoid alcohol use, particularly beer.  There appeared to be no evidence of alcohol addiction.  She was also instructed to be a little more liberal with her salt intake.  She was discharged home in improved and stable condition, afebrile, and hemodynamically stable.  PHYSICAL EXAMINATION:  VITAL SIGNS:  Temperature 98.2, pulse 91, blood pressure 116/81, respiratory rate 20, oxygen saturation 96% on room air. LUNGS:  Clear to auscultation bilaterally. HEART:  S1, S2.  No murmurs, rubs, or gallops. ABDOMEN:  Positive bowel sounds, soft, nondistended, nontender. EXTREMITIES:  No pedal edema.     Elliot Cousin, M.D.     DF/MEDQ  D:  07/09/2011  T:  07/09/2011  Job:  [id number]  cc:   [facility]

## 2012-01-10 ENCOUNTER — Encounter (HOSPITAL_COMMUNITY): Payer: Self-pay | Admitting: *Deleted

## 2012-01-10 ENCOUNTER — Emergency Department (HOSPITAL_COMMUNITY): Payer: Medicaid Other

## 2012-01-10 ENCOUNTER — Emergency Department (HOSPITAL_COMMUNITY)
Admission: EM | Admit: 2012-01-10 | Discharge: 2012-01-10 | Disposition: A | Discharge status: Home / Self Care | Payer: Medicaid Other | Attending: Emergency Medicine | Admitting: Emergency Medicine

## 2012-01-10 DIAGNOSIS — J3489 Other specified disorders of nose and nasal sinuses: Secondary | ICD-10-CM | POA: Insufficient documentation

## 2012-01-10 DIAGNOSIS — R05 Cough: Secondary | ICD-10-CM | POA: Insufficient documentation

## 2012-01-10 DIAGNOSIS — J329 Chronic sinusitis, unspecified: Secondary | ICD-10-CM

## 2012-01-10 DIAGNOSIS — I10 Essential (primary) hypertension: Secondary | ICD-10-CM | POA: Insufficient documentation

## 2012-01-10 DIAGNOSIS — R059 Cough, unspecified: Secondary | ICD-10-CM | POA: Insufficient documentation

## 2012-01-10 DIAGNOSIS — J069 Acute upper respiratory infection, unspecified: Secondary | ICD-10-CM | POA: Insufficient documentation

## 2012-01-10 DIAGNOSIS — R51 Headache: Secondary | ICD-10-CM | POA: Insufficient documentation

## 2012-01-10 DIAGNOSIS — R109 Unspecified abdominal pain: Secondary | ICD-10-CM | POA: Insufficient documentation

## 2012-01-10 DIAGNOSIS — IMO0001 Reserved for inherently not codable concepts without codable children: Secondary | ICD-10-CM | POA: Insufficient documentation

## 2012-01-10 MED ORDER — PREDNISONE 20 MG PO TABS
60.0000 mg | ORAL_TABLET | Freq: Once | ORAL | Status: AC
  Administered 2012-01-10: 60 mg via ORAL
  Filled 2012-01-10: qty 3

## 2012-01-10 MED ORDER — PROMETHAZINE-CODEINE 6.25-10 MG/5ML PO SYRP: 5.0000 mL | ORAL_SOLUTION | Freq: Four times a day (QID) | ORAL | Status: AC | PRN

## 2012-01-10 MED ORDER — PREDNISONE 10 MG PO TABS: ORAL_TABLET | ORAL | Status: DC

## 2012-01-10 MED ORDER — HYDROCOD POLST-CHLORPHEN POLST 10-8 MG/5ML PO LQCR
5.0000 mL | Freq: Once | ORAL | Status: AC
  Administered 2012-01-10: 5 mL via ORAL
  Filled 2012-01-10: qty 5

## 2012-01-10 NOTE — ED Provider Notes (Signed)
Medical screening examination/treatment/procedure(s) were performed by non-physician practitioner and as supervising physician I was immediately available for consultation/collaboration.  Marymargaret Kirker W. Mckenna Boruff, MD 01/10/12 1843 

## 2012-01-10 NOTE — Discharge Instructions (Signed)
Please stop the claritin. Use Claritin D every 12 hours for congestion. Use prednisone daily, take with food. Promethazine cough medication every 6 hours as needed for cough. This medication may cause drowsiness, use with caution. Your CHEST XRAY IS NEGATIVE FOR ACUTE PROBLEMS. Please increase fluids.Sinusitis Sinusitis an infection of the air pockets (sinuses) in your face. This can cause puffiness (swelling). It can also cause drainage from your sinuses.  HOME CARE   Only take medicine as told by your doctor.   Drink enough fluids to keep your pee (urine) clear or pale yellow.   Apply moist heat or ice packs for pain relief.   Use salt (saline) nose sprays. The spray will wet the thick fluid in the nose. This can help the sinuses drain.  GET HELP RIGHT AWAY IF:   You have a fever.   Your baby is older than 3 months with a rectal temperature of 102 F (38.9 C) or higher.   Your baby is 64 months old or younger with a rectal temperature of 100.4 F (38 C) or higher.   The pain gets worse.   You get a very bad headache.   You keep throwing up (vomiting).   Your face gets puffy.  MAKE SURE YOU:   Understand these instructions.   Will watch your condition.   Will get help right away if you are not doing well or get worse.  Document Released: 02/18/2008 Document Revised: 08/21/2011 Document Reviewed: 02/18/2008 [facility] Patient Information 2012 [place], [place].Upper Respiratory Infection, Adult An upper respiratory infection (URI) is also sometimes known as the common cold. The upper respiratory tract includes the nose, sinuses, throat, trachea, and bronchi. Bronchi are the airways leading to the lungs. Most people improve within 1 week, but symptoms can last up to 2 weeks. A residual cough may last even longer.  CAUSES Many different viruses can infect the tissues lining the upper respiratory tract. The tissues become irritated and inflamed and often become very moist. Mucus  production is also common. A cold is contagious. You can easily spread the virus to others by oral contact. This includes kissing, sharing a glass, coughing, or sneezing. Touching your mouth or nose and then touching a surface, which is then touched by another person, can also spread the virus. SYMPTOMS  Symptoms typically develop 1 to 3 days after you come in contact with a cold virus. Symptoms vary from person to person. They may include:  Runny nose.   Sneezing.   Nasal congestion.   Sinus irritation.   Sore throat.   Loss of voice (laryngitis).   Cough.   Fatigue.   Muscle aches.   Loss of appetite.   Headache.   Low-grade fever.  DIAGNOSIS  You might diagnose your own cold based on familiar symptoms, since most people get a cold 2 to 3 times a year. Your caregiver can confirm this based on your exam. Most importantly, your caregiver can check that your symptoms are not due to another disease such as strep throat, sinusitis, pneumonia, asthma, or epiglottitis. Blood tests, throat tests, and X-rays are not necessary to diagnose a common cold, but they may sometimes be helpful in excluding other more serious diseases. Your caregiver will decide if any further tests are required. RISKS AND COMPLICATIONS  You may be at risk for a more severe case of the common cold if you smoke cigarettes, have chronic heart disease (such as heart failure) or lung disease (such as asthma), or if you have  a weakened immune system. The very young and very old are also at risk for more serious infections. Bacterial sinusitis, middle ear infections, and bacterial pneumonia can complicate the common cold. The common cold can worsen asthma and chronic obstructive pulmonary disease (COPD). Sometimes, these complications can require emergency medical care and may be life-threatening. PREVENTION  The best way to protect against getting a cold is to practice good hygiene. Avoid oral or hand contact with people  with cold symptoms. Wash your hands often if contact occurs. There is no clear evidence that vitamin C, vitamin E, echinacea, or exercise reduces the chance of developing a cold. However, it is always recommended to get plenty of rest and practice good nutrition. TREATMENT  Treatment is directed at relieving symptoms. There is no cure. Antibiotics are not effective, because the infection is caused by a virus, not by bacteria. Treatment may include:  Increased fluid intake. Sports drinks offer valuable electrolytes, sugars, and fluids.   Breathing heated mist or steam (vaporizer or shower).   Eating chicken soup or other clear broths, and maintaining good nutrition.   Getting plenty of rest.   Using gargles or lozenges for comfort.   Controlling fevers with ibuprofen or acetaminophen as directed by your caregiver.   Increasing usage of your inhaler if you have asthma.  Zinc gel and zinc lozenges, taken in the first 24 hours of the common cold, can shorten the duration and lessen the severity of symptoms. Pain medicines may help with fever, muscle aches, and throat pain. A variety of non-prescription medicines are available to treat congestion and runny nose. Your caregiver can make recommendations and may suggest nasal or lung inhalers for other symptoms.  HOME CARE INSTRUCTIONS   Only take over-the-counter or prescription medicines for pain, discomfort, or fever as directed by your caregiver.   Use a warm mist humidifier or inhale steam from a shower to increase air moisture. This may keep secretions moist and make it easier to breathe.   Drink enough water and fluids to keep your urine clear or pale yellow.   Rest as needed.   Return to work when your temperature has returned to normal or as your caregiver advises. You may need to stay home longer to avoid infecting others. You can also use a face mask and careful hand washing to prevent spread of the virus.  SEEK MEDICAL CARE IF:    After the first few days, you feel you are getting worse rather than better.   You need your caregiver's advice about medicines to control symptoms.   You develop chills, worsening shortness of breath, or brown or red sputum. These may be signs of pneumonia.   You develop yellow or brown nasal discharge or pain in the face, especially when you bend forward. These may be signs of sinusitis.   You develop a fever, swollen neck glands, pain with swallowing, or white areas in the back of your throat. These may be signs of strep throat.  SEEK IMMEDIATE MEDICAL CARE IF:   You have a fever.   You develop severe or persistent headache, ear pain, sinus pain, or chest pain.   You develop wheezing, a prolonged cough, cough up blood, or have a change in your usual mucus (if you have chronic lung disease).   You develop sore muscles or a stiff neck.  Document Released: 02/25/2001 Document Revised: 08/21/2011 Document Reviewed: 01/03/2011 [facility] Patient Information 2012 [place], [place].

## 2012-01-10 NOTE — ED Provider Notes (Signed)
History     CSN: [id number]  Arrival date & time 01/10/12  0802   First MD Initiated Contact with Patient 01/10/12 [phone]      Chief Complaint  Patient presents with  . Cough  . Abdominal Pain    (Consider location/radiation/quality/duration/timing/severity/associated sxs/prior treatment) Patient is a 56 y.o. female presenting with cough and abdominal pain. The history is provided by the patient.  Cough This is a new problem. The current episode started more than 2 days ago. The problem occurs hourly. The problem has been gradually worsening. The cough is productive of sputum. There has been no fever. Associated symptoms include headaches, rhinorrhea and myalgias. Pertinent negatives include no chest pain, no ear pain, no sore throat, no shortness of breath and no wheezing. Treatments tried: theraflu and advil. The treatment provided no relief. She is not a smoker. Her past medical history is significant for asthma. Her past medical history does not include bronchitis or pneumonia.  Abdominal Pain The primary symptoms of the illness include abdominal pain. The primary symptoms of the illness do not include shortness of breath or dysuria.  Symptoms associated with the illness do not include hematuria, frequency or back pain.    Past Medical History  Diagnosis Date  . HTN (hypertension)   . Bacterial vaginosis   . Alcohol use   . DUB (dysfunctional uterine bleeding) 1970's    S/p hysterectomy     Past Surgical History  Procedure Date  . Abdominal hysterectomy 1970's    No family history on file.  History  Substance Use Topics  . Smoking status: Never Smoker   . Smokeless tobacco: Not on file  . Alcohol Use: Yes     sometimes    OB History    Grav Para Term Preterm Abortions TAB SAB Ect Mult Living                  Review of Systems  Constitutional: Negative for activity change.       All ROS Neg except as noted in HPI  HENT: Positive for rhinorrhea and sneezing.  Negative for ear pain, nosebleeds, sore throat and neck pain.   Eyes: Negative for photophobia and discharge.  Respiratory: Positive for cough. Negative for shortness of breath and wheezing.   Cardiovascular: Negative for chest pain and palpitations.  Gastrointestinal: Positive for abdominal pain. Negative for blood in stool.  Genitourinary: Negative for dysuria, frequency and hematuria.  Musculoskeletal: Positive for myalgias. Negative for back pain and arthralgias.  Skin: Negative.   Neurological: Positive for headaches. Negative for dizziness, seizures and speech difficulty.  Psychiatric/Behavioral: Negative for hallucinations and confusion.    Allergies  Review of patient's allergies indicates no known allergies.  Home Medications   Current Outpatient Rx  Name Route Sig Dispense Refill  . LISINOPRIL 10 MG PO TABS Oral Take 1 tablet (10 mg total) by mouth daily. FOR HIGH BLOOD PRESSURE. 30 tablet 1  . LORATADINE 10 MG PO TABS Oral Take 1 tablet (10 mg total) by mouth daily as needed for allergies.    . OMEPRAZOLE 20 MG PO CPDR Oral Take 1 capsule (20 mg total) by mouth daily. FOR ACID REFLUX AND GASTRITIS 30 capsule 1    BP 152/93  Pulse 93  Temp(Src) 98.3 F (36.8 C) (Oral)  Resp 20  Ht 5\' 7"  (1.702 m)  Wt 126 lb (57.153 kg)  BMI 19.73 kg/m2  SpO2 97%  Physical Exam  Nursing note and vitals reviewed. Constitutional: She is  oriented to person, place, and time. She appears well-developed and well-nourished.  Non-toxic appearance.  HENT:  Head: Normocephalic.  Right Ear: Tympanic membrane and external ear normal.  Left Ear: Tympanic membrane and external ear normal.       Nasal congestion  Eyes: EOM and lids are normal. Pupils are equal, round, and reactive to light.  Neck: Normal range of motion. Neck supple. Carotid bruit is not present.  Cardiovascular: Normal rate, regular rhythm, normal heart sounds, intact distal pulses and normal pulses.   Pulmonary/Chest: Breath  sounds normal. No respiratory distress. She exhibits tenderness.       Course breath sounds. No wheezing. No focal consolidation.  Abdominal: Soft. Bowel sounds are normal. There is no tenderness. There is no guarding.  Musculoskeletal: Normal range of motion.  Lymphadenopathy:       Head (right side): No submandibular adenopathy present.       Head (left side): No submandibular adenopathy present.    She has no cervical adenopathy.  Neurological: She is alert and oriented to person, place, and time. She has normal strength. No cranial nerve deficit or sensory deficit.  Skin: Skin is warm and dry.  Psychiatric: She has a normal mood and affect. Her speech is normal.    ED Course  Procedures (including critical care time)  Labs Reviewed - No data to display No results found.Pulse Ox 97% on RA. WNl by my interpretation.  No diagnosis found.    MDM  I have reviewed nursing notes, vital signs, and all appropriate lab and imaging results for this patient. The chest xray is negative for acute changes. Will as pt to change to claritin D, Rx for prednisone and promethazine cough med given.       Kathie Dike, [place] 01/10/12 [phone]

## 2012-01-10 NOTE — ED Notes (Signed)
Increased productive cough/congestion x 1 wk.  Reports mid abd pain with n/v x 2 days.  Denies fever/sob.

## 2012-01-10 NOTE — ED Notes (Signed)
Pt c/o productive cough with yellow sputum. Pt states that when she coughs hard it makes her throw up. Pt states that she has been taking Advil and Theraflu but it is not helping. Pt alert and oriented x 3. Skin warm and dry. Color pink. Breath sounds clear and equal bilaterally.

## 2012-05-19 ENCOUNTER — Other Ambulatory Visit (HOSPITAL_COMMUNITY): Payer: Self-pay | Admitting: Nurse Practitioner

## 2012-05-19 DIAGNOSIS — Z139 Encounter for screening, unspecified: Secondary | ICD-10-CM

## 2012-05-29 ENCOUNTER — Encounter (HOSPITAL_COMMUNITY): Payer: Self-pay | Admitting: *Deleted

## 2012-05-29 ENCOUNTER — Emergency Department (HOSPITAL_COMMUNITY)
Admission: EM | Admit: 2012-05-29 | Discharge: 2012-05-29 | Disposition: A | Discharge status: Home / Self Care | Payer: Medicaid Other | Attending: Emergency Medicine | Admitting: Emergency Medicine

## 2012-05-29 DIAGNOSIS — Z77098 Contact with and (suspected) exposure to other hazardous, chiefly nonmedicinal, chemicals: Secondary | ICD-10-CM

## 2012-05-29 DIAGNOSIS — I10 Essential (primary) hypertension: Secondary | ICD-10-CM | POA: Insufficient documentation

## 2012-05-29 MED ORDER — TETANUS-DIPHTH-ACELL PERTUSSIS 5-2.5-18.5 LF-MCG/0.5 IM SUSP
0.5000 mL | Freq: Once | INTRAMUSCULAR | Status: AC
  Administered 2012-05-29: 0.5 mL via INTRAMUSCULAR
  Filled 2012-05-29: qty 0.5

## 2012-05-29 NOTE — ED Notes (Signed)
Pt had used a cleaner called [place] this morning around 0730, immediately started burning to fingers of right hand, redness to finger tips
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
## 2012-05-29 NOTE — ED Notes (Signed)
Able to find MSDS on product that pt had used and given to EDP, no new orders given

## 2012-06-24 ENCOUNTER — Ambulatory Visit (HOSPITAL_COMMUNITY): Payer: PRIVATE HEALTH INSURANCE

## 2012-07-09 ENCOUNTER — Ambulatory Visit (HOSPITAL_COMMUNITY)
Admission: RE | Admit: 2012-07-09 | Discharge: 2012-07-09 | Disposition: A | Discharge status: Home / Self Care | Payer: Disability Insurance | Source: Ambulatory Visit | Attending: Family Medicine | Admitting: Family Medicine

## 2012-07-09 ENCOUNTER — Other Ambulatory Visit (HOSPITAL_COMMUNITY): Payer: Self-pay | Admitting: *Deleted

## 2012-07-09 DIAGNOSIS — M545 Low back pain, unspecified: Secondary | ICD-10-CM | POA: Insufficient documentation

## 2012-07-09 DIAGNOSIS — M5137 Other intervertebral disc degeneration, lumbosacral region: Secondary | ICD-10-CM | POA: Insufficient documentation

## 2012-07-09 DIAGNOSIS — M51379 Other intervertebral disc degeneration, lumbosacral region without mention of lumbar back pain or lower extremity pain: Secondary | ICD-10-CM | POA: Insufficient documentation

## 2013-06-02 ENCOUNTER — Encounter (HOSPITAL_COMMUNITY): Payer: Self-pay | Admitting: *Deleted

## 2013-06-02 ENCOUNTER — Emergency Department (HOSPITAL_COMMUNITY): Payer: MEDICAID

## 2013-06-02 ENCOUNTER — Emergency Department (HOSPITAL_COMMUNITY)
Admission: EM | Admit: 2013-06-02 | Discharge: 2013-06-02 | Disposition: A | Discharge status: Home / Self Care | Payer: MEDICAID | Attending: Emergency Medicine | Admitting: Emergency Medicine

## 2013-06-02 DIAGNOSIS — F10229 Alcohol dependence with intoxication, unspecified: Secondary | ICD-10-CM | POA: Insufficient documentation

## 2013-06-02 DIAGNOSIS — Z8742 Personal history of other diseases of the female genital tract: Secondary | ICD-10-CM | POA: Insufficient documentation

## 2013-06-02 DIAGNOSIS — I10 Essential (primary) hypertension: Secondary | ICD-10-CM | POA: Insufficient documentation

## 2013-06-02 DIAGNOSIS — F10929 Alcohol use, unspecified with intoxication, unspecified: Secondary | ICD-10-CM

## 2013-06-02 DIAGNOSIS — M545 Low back pain, unspecified: Secondary | ICD-10-CM | POA: Insufficient documentation

## 2013-06-02 DIAGNOSIS — R0789 Other chest pain: Secondary | ICD-10-CM | POA: Insufficient documentation

## 2013-06-02 LAB — CBC WITH DIFFERENTIAL/PLATELET
Eosinophils Absolute: 0.3 10*3/uL (ref 0.0–0.7)
Eosinophils Relative: 3 % (ref 0–5)
HCT: 33.8 % — ABNORMAL LOW (ref 36.0–46.0)
Lymphocytes Relative: 43 % (ref 12–46)
Lymphs Abs: 3.7 10*3/uL (ref 0.7–4.0)
MCH: 30.7 pg (ref 26.0–34.0)
MCV: 91.8 fL (ref 78.0–100.0)
Monocytes Absolute: 0.6 10*3/uL (ref 0.1–1.0)
Monocytes Relative: 7 % (ref 3–12)
Platelets: 275 10*3/uL (ref 150–400)
RBC: 3.68 MIL/uL — ABNORMAL LOW (ref 3.87–5.11)

## 2013-06-02 LAB — RAPID URINE DRUG SCREEN, HOSP PERFORMED
Barbiturates: NOT DETECTED
Opiates: NOT DETECTED
Tetrahydrocannabinol: NOT DETECTED

## 2013-06-02 LAB — URINALYSIS, ROUTINE W REFLEX MICROSCOPIC
Hgb urine dipstick: NEGATIVE
Nitrite: NEGATIVE
Protein, ur: NEGATIVE mg/dL
Specific Gravity, Urine: <1.005 — ABNORMAL LOW (ref 1.005–1.030)
Urobilinogen, UA: 0.2 mg/dL (ref 0.0–1.0)

## 2013-06-02 LAB — TROPONIN I: Troponin I: <0.3 ng/mL (ref <0.30)

## 2013-06-02 LAB — LIPASE, BLOOD: Lipase: 28 U/L (ref 11–59)

## 2013-06-02 LAB — COMPREHENSIVE METABOLIC PANEL
BUN: 9 mg/dL (ref 6–23)
CO2: 23 mEq/L (ref 19–32)
Calcium: 10.4 mg/dL (ref 8.4–10.5)
Creatinine, Ser: 0.65 mg/dL (ref 0.50–1.10)
GFR calc Af Amer: >90 mL/min (ref >90)
GFR calc non Af Amer: >90 mL/min (ref >90)
Glucose, Bld: 95 mg/dL (ref 70–99)
Total Protein: 8.6 g/dL — ABNORMAL HIGH (ref 6.0–8.3)

## 2013-06-02 LAB — LACTIC ACID, PLASMA: Lactic Acid, Venous: 2.4 mmol/L — ABNORMAL HIGH (ref 0.5–2.2)

## 2013-06-02 MED ORDER — ONDANSETRON HCL 4 MG/2ML IJ SOLN
4.0000 mg | Freq: Once | INTRAMUSCULAR | Status: AC
  Administered 2013-06-02: 4 mg via INTRAVENOUS
  Filled 2013-06-02: qty 2

## 2013-06-02 MED ORDER — SODIUM CHLORIDE 0.9 % IV BOLUS (SEPSIS)
1000.0000 mL | Freq: Once | INTRAVENOUS | Status: AC
  Administered 2013-06-02: 1000 mL via INTRAVENOUS

## 2013-06-02 MED ORDER — IOHEXOL 300 MG/ML  SOLN
100.0000 mL | Freq: Once | INTRAMUSCULAR | Status: AC | PRN
  Administered 2013-06-02: 100 mL via INTRAVENOUS

## 2013-06-02 MED ORDER — IOHEXOL 300 MG/ML  SOLN
50.0000 mL | Freq: Once | INTRAMUSCULAR | Status: AC | PRN
  Administered 2013-06-02: 50 mL via ORAL

## 2013-06-02 NOTE — ED Notes (Signed)
Given Sprite 8oz for po fluid challenge.  Tolerating well.

## 2013-06-02 NOTE — ED Provider Notes (Signed)
CSN: [id number]     Arrival date & time 06/02/13  1732 History  This chart was scribed for Glynn Octave, MD by Bennett Scrape, ED Scribe. This patient was seen in room APA02/APA02 and the patient's care was started at 5:43 PM.     Chief Complaint  Patient presents with  . Abdominal Pain    The history is provided by the patient. No language interpreter was used.    HPI Comments: Judy Davis is a 57 y.o. female brought in by ambulance, who presents to the Emergency Department complaining of generalized abdominal pain for the past 2 days. She states that the pain has been intermittent and she reports that she has been moving her bowels normally since the onset. She lists 2 associated episodes of yellow emesis. Pt denies having prior episodes of similar symptoms, denies any recent sick contacts with the same and denies taking OTC medications at home to improve symptoms. She denies diarrhea, hematochezia, urinary symptoms, SOB and hematemesis. She has a h/o total hysterectomy.   She also c/o of secondary complaint of substernal CP for the past 3 days. The CP is aggravated with deep breathing. She denies radiation from the abdomen and denies any recent traumas. She denies having a h/o cardiac disease or surgeries. She denies SOB and diaphoresis.   She also c/o a tertiary complaint of lower back pain for the past 3 weeks. She denies any prior episodes or h/o back pain. She denies any recent traumas or injuries. She also denies radiation into or from the abdomen. She denies numbness and extremity weakness as associated symptoms.  Pt states that she has not been evaluated by a PCP in 20 years.  Past Medical History  Diagnosis Date  . HTN (hypertension)   . Bacterial vaginosis   . Alcohol use   . DUB (dysfunctional uterine bleeding) 1970's    S/p hysterectomy    Past Surgical History  Procedure Laterality Date  . Abdominal hysterectomy  1970's   No family history on file. History   Substance Use Topics  . Smoking status: Never Smoker   . Smokeless tobacco: Not on file  . Alcohol Use: Yes     Comment: every day   No OB history provided.  Review of Systems  A complete 10 system review of systems was obtained and all systems are negative except as noted in the HPI and PMH.   Allergies  Review of patient's allergies indicates no known allergies.  Home Medications   No current outpatient prescriptions on file. Triage Vitals: BP 162/91  Pulse 71  Temp(Src) 98.1 F (36.7 C) (Oral)  Resp 18  SpO2 98%  Physical Exam  Nursing note and vitals reviewed. Constitutional: She is oriented to person, place, and time. She appears well-developed and well-nourished. No distress.  Smells of alcohol  HENT:  Head: Normocephalic and atraumatic.  Poor dentition   Eyes: Conjunctivae and EOM are normal.  Neck: Normal range of motion. Neck supple. No tracheal deviation present.  Cardiovascular: Normal rate, regular rhythm and normal heart sounds.   No murmur heard. Pulmonary/Chest: Effort normal and breath sounds normal. No respiratory distress. She has no wheezes. She has no rales. She exhibits tenderness.  TTP L side chest  Abdominal: Soft. Bowel sounds are normal. There is tenderness (diffuse). There is no rebound and no guarding.  No CVA tenderness  Musculoskeletal: Normal range of motion. She exhibits no edema.  Paraspinal lumbar tenderness, Intact peripheral pulses, no peripheral edema 5/5 strength  in bilateral lower extremities. Ankle plantar and dorsiflexion intact. Great toe extension intact bilaterally. +2 DP and PT pulses. +2 patellar reflexes bilaterally. Normal gait.   Neurological: She is alert and oriented to person, place, and time. No cranial nerve deficit.  Skin: Skin is warm and dry.  Psychiatric: She has a normal mood and affect. Her behavior is normal.    ED Course  Procedures (including critical care time)  Medications  sodium chloride 0.9 %  bolus 1,000 mL (1,000 mLs Intravenous New Bag/Given 06/02/13 2057)  sodium chloride 0.9 % bolus 1,000 mL (0 mLs Intravenous Stopped 06/02/13 2002)  ondansetron (ZOFRAN) injection 4 mg (4 mg Intravenous Given 06/02/13 1839)  iohexol (OMNIPAQUE) 300 MG/ML solution 50 mL (50 mLs Oral Contrast Given 06/02/13 1945)  iohexol (OMNIPAQUE) 300 MG/ML solution 100 mL (100 mLs Intravenous Contrast Given 06/02/13 1945)  sodium chloride 0.9 % bolus 1,000 mL (0 mLs Intravenous Stopped 06/02/13 2144)    DIAGNOSTIC STUDIES: Oxygen Saturation is 98% on room air, normal by my interpretation.    COORDINATION OF CARE: 5:47 PM-Discussed treatment plan which includes medications, CBC panel, CMP and UA with pt at bedside and pt agreed to plan.   10:29 PM-Pt rechecked and feels improved with medications listed above. Upon re-exam, the pt has no tenderness. She denies having abdominal pain, CP or SOB currently. Informed pt of alcohol level and she denies daily drinking. She reports that she met up with an old friend today and was drinking in celebration. She denies SI and HI. She states that she has a safe place to go upon discharge.  Labs Review Labs Reviewed  CBC WITH DIFFERENTIAL - Abnormal; Notable for the following:    RBC 3.68 (*)    Hemoglobin 11.3 (*)    HCT 33.8 (*)    All other components within normal limits  COMPREHENSIVE METABOLIC PANEL - Abnormal; Notable for the following:    Sodium 134 (*)    Potassium 3.4 (*)    Total Protein 8.6 (*)    All other components within normal limits  URINALYSIS, ROUTINE W REFLEX MICROSCOPIC - Abnormal; Notable for the following:    Specific Gravity, Urine <1.005 (*)    All other components within normal limits  ETHANOL - Abnormal; Notable for the following:    Alcohol, Ethyl (B) 333 (*)    All other components within normal limits  LACTIC ACID, PLASMA - Abnormal; Notable for the following:    Lactic Acid, Venous 2.6 (*)    All other components within normal limits   LACTIC ACID, PLASMA - Abnormal; Notable for the following:    Lactic Acid, Venous 2.4 (*)    All other components within normal limits  CG4 I-STAT (LACTIC ACID) - Abnormal; Notable for the following:    Lactic Acid, Venous 2.36 (*)    All other components within normal limits  LIPASE, BLOOD  TROPONIN I  URINE RAPID DRUG SCREEN (HOSP PERFORMED)   Imaging Review Ct Abdomen Pelvis W Contrast  06/02/2013   CLINICAL DATA:  Abdominal pain, vomiting, substernal chest pain, low back pain  EXAM: CT ABDOMEN AND PELVIS WITH CONTRAST  TECHNIQUE: Multidetector CT imaging of the abdomen and pelvis was performed using the standard protocol following bolus administration of intravenous contrast.  CONTRAST:  50mL OMNIPAQUE IOHEXOL 300 MG/ML SOLN, OMNIPAQUE IOHEXOL 300 MG/ML SOLN  COMPARISON:  None.  FINDINGS: Lung bases are clear.  Liver is notable for focal fat/altered perfusion along the falciform ligament (series 2/image 16).  12 mm cyst in the inferior right hepatic lobe (series 2/image 29).  Spleen and adrenal glands are within normal limits.  Pancreatic atrophy with parenchymal calcifications in the head/uncinate process, both collecting prior/chronic pancreatitis.  Gallbladder is underdistended. No intrahepatic or extrahepatic ductal dilatation.  Kidneys are within normal limits. Bilateral extrarenal pelvis.  No evidence of bowel obstruction.  No evidence of abdominal aortic aneurysm.  No abdominopelvic ascites.  No suspicious abdominopelvic lymphadenopathy.  Status post hysterectomy. Bilateral ovaries are within normal limits.  Bladder is within normal limits.  Small fat containing periumbilical hernia.  Degenerative changes of the visualized thoracolumbar spine.  IMPRESSION: No evidence of bowel obstruction.  Sequela of prior/chronic pancreatitis.  No CT findings to account for the patient's abdominal pain.   Electronically Signed   By: Charline Bills M.D.   On: 06/02/2013 20:12    MDM   1.  Alcohol intoxication    Patient complains of 3 days of diffuse abdominal pain with nausea and vomiting and lower back pain. She has some left-sided chest pain is reproducible.  EKG is normal sinus rhythm. Labs showed mild elevation of lactate. Normal CBC, LFTs and lipase. Patient is significantly intoxicated.  CT scan is not show any acute abnormality to explain patient's abdominal pain. After hydration, repeat lactate 2.4.  Patient ambulatory in the ED and tolerating by mouth. She states her abdominal pain has improved. She denies any chest pain or shortness of breath. She states she does not drink alcohol all the time but only binges when she is a special occasion. She has a ride home today. She is a safe place to go. We'll give referral to PCP. She denies any suicidal or homicidal thoughts.   Date: 06/02/2013  Rate: 70  Rhythm: normal sinus rhythm  QRS Axis: normal  Intervals: normal  ST/T Wave abnormalities: normal  Conduction Disutrbances:none  Narrative Interpretation:   Old EKG Reviewed: unchanged  BP 160/79  Pulse 79  Temp(Src) 97.9 F (36.6 C) (Oral)  Resp 20  SpO2 98%    I personally performed the services described in this documentation, which was scribed in my presence. The recorded information has been reviewed and is accurate.     Glynn Octave, MD 06/02/13 [phone]

## 2013-06-02 NOTE — ED Notes (Signed)
Left in c/o sister for transport home.  Instructions and f/u information given/reviewed - verbalizes understanding.

## 2013-06-02 NOTE — ED Notes (Signed)
Abdominal pain, nausea and vomiting, shortness of breath

## 2013-06-02 NOTE — ED Notes (Signed)
Ambulatory down hall to bathroom and return to room; gait steady.

## 2014-04-21 ENCOUNTER — Emergency Department (HOSPITAL_COMMUNITY)
Admission: EM | Admit: 2014-04-21 | Discharge: 2014-04-21 | Disposition: A | Discharge status: Home / Self Care | Payer: Medicaid Other | Attending: Emergency Medicine | Admitting: Emergency Medicine

## 2014-04-21 ENCOUNTER — Encounter (HOSPITAL_COMMUNITY): Payer: Self-pay | Admitting: Emergency Medicine

## 2014-04-21 DIAGNOSIS — Z8742 Personal history of other diseases of the female genital tract: Secondary | ICD-10-CM | POA: Insufficient documentation

## 2014-04-21 DIAGNOSIS — Z90710 Acquired absence of both cervix and uterus: Secondary | ICD-10-CM | POA: Insufficient documentation

## 2014-04-21 DIAGNOSIS — I10 Essential (primary) hypertension: Secondary | ICD-10-CM | POA: Diagnosis not present

## 2014-04-21 DIAGNOSIS — Z8619 Personal history of other infectious and parasitic diseases: Secondary | ICD-10-CM | POA: Insufficient documentation

## 2014-04-21 DIAGNOSIS — R110 Nausea: Secondary | ICD-10-CM | POA: Insufficient documentation

## 2014-04-21 DIAGNOSIS — R1013 Epigastric pain: Secondary | ICD-10-CM | POA: Insufficient documentation

## 2014-04-21 DIAGNOSIS — K920 Hematemesis: Secondary | ICD-10-CM | POA: Diagnosis not present

## 2014-04-21 DIAGNOSIS — R1032 Left lower quadrant pain: Secondary | ICD-10-CM | POA: Diagnosis not present

## 2014-04-21 LAB — CBC WITH DIFFERENTIAL/PLATELET
BASOS ABS: 0 10*3/uL (ref 0.0–0.1)
BASOS PCT: 1 % (ref 0–1)
EOS ABS: 0.2 10*3/uL (ref 0.0–0.7)
Eosinophils Relative: 3 % (ref 0–5)
HCT: 37.4 % (ref 36.0–46.0)
Hemoglobin: 12.6 g/dL (ref 12.0–15.0)
Lymphocytes Relative: 60 % — ABNORMAL HIGH (ref 12–46)
Lymphs Abs: 4.5 10*3/uL — ABNORMAL HIGH (ref 0.7–4.0)
MCH: 30.5 pg (ref 26.0–34.0)
MCHC: 33.7 g/dL (ref 30.0–36.0)
MCV: 90.6 fL (ref 78.0–100.0)
MONOS PCT: 4 % (ref 3–12)
Monocytes Absolute: 0.3 10*3/uL (ref 0.1–1.0)
NEUTROS ABS: 2.4 10*3/uL (ref 1.7–7.7)
NEUTROS PCT: 32 % — AB (ref 43–77)
PLATELETS: 342 10*3/uL (ref 150–400)
RBC: 4.13 MIL/uL (ref 3.87–5.11)
RDW: 13.8 % (ref 11.5–15.5)
WBC: 7.3 10*3/uL (ref 4.0–10.5)

## 2014-04-21 LAB — COMPREHENSIVE METABOLIC PANEL
ALBUMIN: 4.6 g/dL (ref 3.5–5.2)
ALK PHOS: 83 U/L (ref 39–117)
ALT: 27 U/L (ref 0–35)
ANION GAP: 20 — AB (ref 5–15)
AST: 55 U/L — AB (ref 0–37)
BILIRUBIN TOTAL: 0.4 mg/dL (ref 0.3–1.2)
BUN: 8 mg/dL (ref 6–23)
CHLORIDE: 97 meq/L (ref 96–112)
CO2: 23 mEq/L (ref 19–32)
Calcium: 9.7 mg/dL (ref 8.4–10.5)
Creatinine, Ser: 0.78 mg/dL (ref 0.50–1.10)
GFR calc Af Amer: >90 mL/min (ref >90)
GFR calc non Af Amer: 90 mL/min — ABNORMAL LOW (ref >90)
Glucose, Bld: 109 mg/dL — ABNORMAL HIGH (ref 70–99)
POTASSIUM: 3.8 meq/L (ref 3.7–5.3)
SODIUM: 140 meq/L (ref 137–147)
TOTAL PROTEIN: 8.9 g/dL — AB (ref 6.0–8.3)

## 2014-04-21 LAB — RAPID URINE DRUG SCREEN, HOSP PERFORMED
AMPHETAMINES: NOT DETECTED
BARBITURATES: NOT DETECTED
BENZODIAZEPINES: NOT DETECTED
COCAINE: NOT DETECTED
OPIATES: NOT DETECTED
TETRAHYDROCANNABINOL: NOT DETECTED

## 2014-04-21 LAB — URINALYSIS, ROUTINE W REFLEX MICROSCOPIC
BILIRUBIN URINE: NEGATIVE
GLUCOSE, UA: NEGATIVE mg/dL
Ketones, ur: NEGATIVE mg/dL
Leukocytes, UA: NEGATIVE
Nitrite: NEGATIVE
Protein, ur: 100 mg/dL — AB
SPECIFIC GRAVITY, URINE: 1.01 (ref 1.005–1.030)
UROBILINOGEN UA: 0.2 mg/dL (ref 0.0–1.0)
pH: 5.5 (ref 5.0–8.0)

## 2014-04-21 LAB — URINE MICROSCOPIC-ADD ON

## 2014-04-21 MED ORDER — OMEPRAZOLE 20 MG PO CPDR: 20.0000 mg | DELAYED_RELEASE_CAPSULE | Freq: Two times a day (BID) | ORAL | Status: DC

## 2014-04-21 MED ORDER — HYDROCODONE-ACETAMINOPHEN 5-325 MG PO TABS: 1.0000 | ORAL_TABLET | ORAL | Status: DC | PRN

## 2014-04-21 MED ORDER — GI COCKTAIL ~~LOC~~
30.0000 mL | Freq: Once | ORAL | Status: AC
  Administered 2014-04-21: 30 mL via ORAL
  Filled 2014-04-21: qty 30

## 2014-04-21 NOTE — ED Notes (Signed)
Patient is sitting in chair stating "she is ready to go home".  Would not allow vital signs taken.

## 2014-04-21 NOTE — Discharge Instructions (Signed)
Prilosec as prescribed.  Hydrocodone as prescribed as needed for pain.  Return to the emergency department if you develop worsening symptoms, high fever, or increasing pain.   Hematemesis This condition is the vomiting of blood. CAUSES  This can happen if you have a peptic ulcer or an irritation of the throat, stomach, or small bowel. Vomiting over and over again or swallowing blood from a nosebleed, coughing or facial injury can also result in bloody vomit. Anti-inflammatory pain medicines are a common cause of this potentially dangerous condition. The most serious causes of vomiting blood include:  Ulcers (a bacteria called H. pylori is common cause of ulcers).  Clotting problems.  Alcoholism.  Cirrhosis. TREATMENT  Treatment depends on the cause and the severity of the bleeding. Small amounts of blood streaks in the vomit is not the same as vomiting large amounts of bloody or dark, coffee grounds-like material. Weakness, fainting, dehydration, anemia, and continued alcohol or drug use increase the risk. Examination may include blood, vomit, or stool tests. The presence of bloody or dark stool that tests positive for blood (Hemoccult) means the bleeding has been going on for some time. Endoscopy and imaging studies may be done. Emergency treatment may include:  IV medicines or fluids.  Blood transfusions.  Surgery. Hospital care is required for high risk patients or when IV fluids or blood is needed. Upper GI bleeding can cause shock and death if not controlled. HOME CARE INSTRUCTIONS   Your treatment does not require hospital care at this time.  Remain at rest until your condition improves.  Drink clear liquids as tolerated.  Avoid:  Alcohol.  Nicotine.  Aspirin.  Any other anti-inflammatory medicine (ibuprofen, naproxen, and many others).  Medications to suppress stomach acid or vomiting may be needed. Take all your medicine as prescribed.  Be sure to see your  caregiver for follow-up as recommended. SEEK IMMEDIATE MEDICAL CARE IF:   You have repeated vomiting, dehydration, fainting, or extreme weakness.  You are vomiting large amounts of bloody or dark material.  You pass large, dark or bloody stools. Document Released: 10/09/2004 Document Revised: 11/24/2011 Document Reviewed: 10/25/2008 [facility] Patient Information 2015 [place], [place]. This information is not intended to replace advice given to you by your health care provider. Make sure you discuss any questions you have with your health care provider.

## 2014-04-21 NOTE — ED Provider Notes (Signed)
CSN: [id number]     Arrival date & time 04/21/14  1624 History   First MD Initiated Contact with Patient 04/21/14 1753     Chief Complaint  Patient presents with  . Hematemesis     (Consider location/radiation/quality/duration/timing/severity/associated sxs/prior Treatment) HPI Comments: Patient is a 58 year old female with history of hypertension and alcohol abuse. She presents with complaints of epigastric discomfort and vomiting up what she believes is blood. Her vomit is dark in color, but her stool is normal. She denies any melena. She denies any fevers or chills. These complaints have been going on for approximately 2 days and are worsening. She has had a colonoscopy in the past but denies ever having had an endoscopy. There are no aggravating or alleviating factors.  The history is provided by the patient.    Past Medical History  Diagnosis Date  . HTN (hypertension)   . Bacterial vaginosis   . Alcohol use   . DUB (dysfunctional uterine bleeding) 1970's    S/p hysterectomy    Past Surgical History  Procedure Laterality Date  . Abdominal hysterectomy  1970's   No family history on file. History  Substance Use Topics  . Smoking status: Never Smoker   . Smokeless tobacco: Not on file  . Alcohol Use: Yes     Comment: every day   OB History   Grav Para Term Preterm Abortions TAB SAB Ect Mult Living                 Review of Systems  All other systems reviewed and are negative.     Allergies  Review of patient's allergies indicates no known allergies.  Home Medications   Prior to Admission medications   Not on File   BP 174/104  Pulse 97  Temp(Src) 98 F (36.7 C) (Oral)  Resp 18  Ht 5\' 6"  (1.676 m)  Wt 134 lb (60.782 kg)  BMI 21.64 kg/m2  SpO2 100% Physical Exam  Nursing note and vitals reviewed. Constitutional: She is oriented to person, place, and time. She appears well-developed and well-nourished. No distress.  HENT:  Head: Normocephalic and  atraumatic.  Neck: Normal range of motion. Neck supple.  Cardiovascular: Normal rate and regular rhythm.  Exam reveals no gallop and no friction rub.   No murmur heard. Pulmonary/Chest: Effort normal and breath sounds normal. No respiratory distress. She has no wheezes.  Abdominal: Soft. Bowel sounds are normal. She exhibits no distension. There is tenderness.  There is tenderness to palpation in the epigastrium and left lower quadrant. There is no rebound and no guarding.  Musculoskeletal: Normal range of motion.  Neurological: She is alert and oriented to person, place, and time.  Skin: Skin is warm and dry. She is not diaphoretic.    ED Course  Procedures (including critical care time) Labs Review Labs Reviewed  CBC WITH DIFFERENTIAL - Abnormal; Notable for the following:    Neutrophils Relative % 32 (*)    Lymphocytes Relative 60 (*)    Lymphs Abs 4.5 (*)    All other components within normal limits  COMPREHENSIVE METABOLIC PANEL - Abnormal; Notable for the following:    Glucose, Bld 109 (*)    Total Protein 8.9 (*)    AST 55 (*)    GFR calc non Af Amer 90 (*)    Anion gap 20 (*)    All other components within normal limits  URINALYSIS, ROUTINE W REFLEX MICROSCOPIC  URINE RAPID DRUG SCREEN (HOSP PERFORMED)  Imaging Review No results found.   EKG Interpretation None      MDM   Final diagnoses:  None    Patient presents with complaints of epigastric and left lower quadrant discomfort and vomiting up dark material she believes is blood. Workup reveals no elevation of white count and hemoglobin is actually above her baseline. There is no elevation of BUN that would be suggestive of an upper GI bleed. She was given a GI cocktail with significant relief. She has had no further vomiting while in the emergency department I feel as though she is appropriate for discharge. I highly doubt a Boerhaave or other emergent pathology. 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 She will be treated with Prilosec and pain medication.  We have discussed performing a CAT scan, however the patient does not want to do this. She prefers to go home and return if her symptoms worsen or change.    Veryl Speak, MD 04/21/14 [phone]

## 2014-04-21 NOTE — ED Notes (Signed)
C/o chest discomfort to left chest.

## 2014-04-21 NOTE — ED Notes (Signed)
MD at bedside. 

## 2014-04-21 NOTE — ED Notes (Signed)
Vomiting up blood this am, dark red.

## 2015-05-10 ENCOUNTER — Emergency Department (HOSPITAL_COMMUNITY): Payer: Medicaid Other

## 2015-05-10 ENCOUNTER — Emergency Department (HOSPITAL_COMMUNITY)
Admission: EM | Admit: 2015-05-10 | Discharge: 2015-05-10 | Disposition: A | Discharge status: Home / Self Care | Payer: Medicaid Other | Attending: Emergency Medicine | Admitting: Emergency Medicine

## 2015-05-10 ENCOUNTER — Encounter (HOSPITAL_COMMUNITY): Payer: Self-pay

## 2015-05-10 DIAGNOSIS — Z8742 Personal history of other diseases of the female genital tract: Secondary | ICD-10-CM | POA: Diagnosis not present

## 2015-05-10 DIAGNOSIS — E871 Hypo-osmolality and hyponatremia: Secondary | ICD-10-CM

## 2015-05-10 DIAGNOSIS — I10 Essential (primary) hypertension: Secondary | ICD-10-CM | POA: Insufficient documentation

## 2015-05-10 DIAGNOSIS — Z8659 Personal history of other mental and behavioral disorders: Secondary | ICD-10-CM | POA: Diagnosis not present

## 2015-05-10 DIAGNOSIS — R109 Unspecified abdominal pain: Secondary | ICD-10-CM | POA: Diagnosis present

## 2015-05-10 DIAGNOSIS — N390 Urinary tract infection, site not specified: Secondary | ICD-10-CM | POA: Diagnosis not present

## 2015-05-10 DIAGNOSIS — Z79899 Other long term (current) drug therapy: Secondary | ICD-10-CM | POA: Insufficient documentation

## 2015-05-10 LAB — URINE MICROSCOPIC-ADD ON

## 2015-05-10 LAB — BASIC METABOLIC PANEL
ANION GAP: 8 (ref 5–15)
BUN: 16 mg/dL (ref 6–20)
CALCIUM: 9.8 mg/dL (ref 8.9–10.3)
CHLORIDE: 98 mmol/L — AB (ref 101–111)
CO2: 24 mmol/L (ref 22–32)
Creatinine, Ser: 0.63 mg/dL (ref 0.44–1.00)
GFR calc non Af Amer: >60 mL/min (ref >60)
Glucose, Bld: 106 mg/dL — ABNORMAL HIGH (ref 65–99)
Potassium: 4.1 mmol/L (ref 3.5–5.1)
Sodium: 130 mmol/L — ABNORMAL LOW (ref 135–145)

## 2015-05-10 LAB — URINALYSIS, ROUTINE W REFLEX MICROSCOPIC
Bilirubin Urine: NEGATIVE
Glucose, UA: NEGATIVE mg/dL
Ketones, ur: NEGATIVE mg/dL
NITRITE: POSITIVE — AB
PH: 5 (ref 5.0–8.0)
Protein, ur: 30 mg/dL — AB
Urobilinogen, UA: 0.2 mg/dL (ref 0.0–1.0)

## 2015-05-10 MED ORDER — CEPHALEXIN 500 MG PO CAPS: 500.0000 mg | ORAL_CAPSULE | Freq: Two times a day (BID) | ORAL | Status: DC

## 2015-05-10 NOTE — ED Provider Notes (Signed)
CSN: [id number]     Arrival date & time 05/10/15  5498 History   First MD Initiated Contact with Patient 05/10/15 [phone]     Chief Complaint  Patient presents with  . Flank Pain     (Consider location/radiation/quality/duration/timing/severity/associated sxs/prior Treatment) HPI Comments: States that she had one episode of vomiting this morning  Patient is a 59 y.o. female presenting with flank pain. The history is provided by the patient. No language interpreter was used.  Flank Pain This is a new problem. The current episode started in the past 7 days. The problem occurs constantly. The problem has been gradually worsening. Pertinent negatives include no chest pain, fever or numbness. Nothing aggravates the symptoms. She has tried nothing for the symptoms.    Past Medical History  Diagnosis Date  . HTN (hypertension)   . Bacterial vaginosis   . Alcohol use   . DUB (dysfunctional uterine bleeding) 1970's    S/p hysterectomy    Past Surgical History  Procedure Laterality Date  . Abdominal hysterectomy  1970's   No family history on file. Social History  Substance Use Topics  . Smoking status: Never Smoker   . Smokeless tobacco: None  . Alcohol Use: Yes     Comment: weekends   OB History    No data available     Review of Systems  Constitutional: Negative for fever.  Cardiovascular: Negative for chest pain.  Genitourinary: Positive for flank pain.  Neurological: Negative for numbness.  All other systems reviewed and are negative.     Allergies  Review of patient's allergies indicates no known allergies.  Home Medications   Prior to Admission medications   Medication Sig Start Date End Date Taking? Authorizing Provider  HYDROcodone-acetaminophen (NORCO) 5-325 MG per tablet Take 1-2 tablets by mouth every 4 (four) hours as needed. 04/21/14   Veryl Speak, MD  omeprazole (PRILOSEC) 20 MG capsule Take 1 capsule (20 mg total) by mouth 2 (two) times daily. 04/21/14    Veryl Speak, MD   BP 187/104 mmHg  Pulse 77  Temp(Src) 98.2 F (36.8 C) (Oral)  Resp 20  Ht 5\' 7"  (1.702 m)  Wt 134 lb (60.782 kg)  BMI 20.98 kg/m2  SpO2 100% Physical Exam  Constitutional: She is oriented to person, place, and time. She appears well-developed and well-nourished.  Cardiovascular: Normal rate and regular rhythm.   Pulmonary/Chest: Effort normal and breath sounds normal.  Abdominal: Soft. Bowel sounds are normal. There is no tenderness.  Musculoskeletal: Normal range of motion.  Neurological: She is alert and oriented to person, place, and time. Coordination normal.  Skin: Skin is warm and dry. No rash noted.  Nursing note and vitals reviewed.   ED Course  Procedures (including critical care time) Labs Review Labs Reviewed  URINALYSIS, ROUTINE W REFLEX MICROSCOPIC (NOT AT [facility]) - Abnormal; Notable for the following:    Specific Gravity, Urine >1.030 (*)    Hgb urine dipstick TRACE (*)    Protein, ur 30 (*)    Nitrite POSITIVE (*)    Leukocytes, UA TRACE (*)    All other components within normal limits  BASIC METABOLIC PANEL - Abnormal; Notable for the following:    Sodium 130 (*)    Chloride 98 (*)    Glucose, Bld 106 (*)    All other components within normal limits  URINE MICROSCOPIC-ADD ON - Abnormal; Notable for the following:    Squamous Epithelial / LPF FEW (*)    Bacteria, UA MANY (*)  All other components within normal limits  URINE CULTURE    Imaging Review Ct Renal Stone Study  05/10/2015   CLINICAL DATA:  Left-sided flank pain for 2 days.  Vomiting.  EXAM: CT ABDOMEN AND PELVIS WITHOUT CONTRAST  TECHNIQUE: Multidetector CT imaging of the abdomen and pelvis was performed following the standard protocol without IV contrast.  COMPARISON:  06/02/2013  FINDINGS: Lower chest: Slight calcification in the proximal ascending aorta. Otherwise normal.  Hepatobiliary: 19 mm cyst in the inferior tip of the right lobe of the liver. Otherwise, normal.   Pancreas: Extensive chronic calcific pancreatitis. No evidence of acute pancreatitis.  Spleen: Normal.  Adrenals/Urinary Tract: Adrenal glands are normal. 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 Numerous phleboliths in the pelvis.  Stomach/Bowel: Normal.  Vascular/Lymphatic: Slight aortic atherosclerosis.  Reproductive: Uterus has been removed.  Ovaries are normal.  Other: No free air or free fluid.  Musculoskeletal: No significant abnormality.  IMPRESSION: Benign appearing abdomen and pelvis clear   Electronically Signed   By: Lorriane Shire M.D.   On: 05/10/2015 10:57   I have personally reviewed and evaluated these images and lab results as part of my medical decision-making.   EKG Interpretation None      MDM   Final diagnoses:  Hyponatremia  UTI (lower urinary tract infection)    No kidney stone noted. Pt has a uti. Culture sent. Will treat with keflex. Discussed htn and hyponatremia with pt    Glendell Docker, NP 05/10/15 [place], MD 05/10/15 [phone]

## 2015-05-10 NOTE — Discharge Instructions (Signed)
Hyponatremia  Hyponatremia is when the salt (sodium) in your blood is low. When salt becomes low, your cells take in extra water and puff up (swell). The puffiness can happen in the whole body. It mostly affects the brain and is very serious.  HOME CARE  Only take medicine as told by your doctor.  Follow any diet instructions you were given. This includes limiting how much fluid you drink.  Keep all doctor visits for tests as told.  Avoid alcohol and drugs. GET HELP RIGHT AWAY IF:  You start to twitch and shake (seize).  You pass out (faint).  You continue to have watery poop (diarrhea) or you throw up (vomit).  You feel sick to your stomach (nauseous).  You are tired (fatigued), have a headache, are confused, or feel weak.  Your problems that first brought you to the doctor come back.  You have trouble following your diet instructions. MAKE SURE YOU:   Understand these instructions.  Will watch your condition.  Will get help right away if you are not doing well or get worse. Document Released: 05/14/2011 Document Revised: 11/24/2011 Document Reviewed: 05/14/2011 [facility] Patient Information 2015 [place], [place]. This information is not intended to replace advice given to you by your health care provider. Make sure you discuss any questions you have with your health care provider.  Urinary Tract Infection A urinary tract infection (UTI) can occur any place along the urinary tract. The tract includes the kidneys, ureters, bladder, and urethra. A type of germ called bacteria often causes a UTI. UTIs are often helped with antibiotic medicine.  HOME CARE   If given, take antibiotics as told by your doctor. Finish them even if you start to feel better.  Drink enough fluids to keep your pee (urine) clear or pale yellow.  Avoid tea, drinks with caffeine, and bubbly (carbonated) drinks.  Pee often. Avoid holding your pee in for a long time.  Pee before and after having sex  (intercourse).  Wipe from front to back after you poop (bowel movement) if you are a woman. Use each tissue only once. GET HELP RIGHT AWAY IF:   You have back pain.  You have lower belly (abdominal) pain.  You have chills.  You feel sick to your stomach (nauseous).  You throw up (vomit).  Your burning or discomfort with peeing does not go away.  You have a fever.  Your symptoms are not better in 3 days. MAKE SURE YOU:   Understand these instructions.  Will watch your condition.  Will get help right away if you are not doing well or get worse. Document Released: 02/18/2008 Document Revised: 05/26/2012 Document Reviewed: 04/01/2012 [facility] Patient Information 2015 [place], [place]. This information is not intended to replace advice given to you by your health care provider. Make sure you discuss any questions you have with your health care provider.

## 2015-05-10 NOTE — ED Notes (Signed)
Pt reports pain in left side/flank area x 2 days.   Denies fever or painful urination.  Reports vomited small amount this morning.

## 2015-05-12 LAB — URINE CULTURE

## 2016-09-29 ENCOUNTER — Encounter (HOSPITAL_COMMUNITY): Payer: Self-pay | Admitting: Emergency Medicine

## 2016-09-29 ENCOUNTER — Observation Stay (HOSPITAL_COMMUNITY)
Admission: EM | Admit: 2016-09-29 | Discharge: 2016-09-30 | Disposition: A | Discharge status: Home / Self Care | Payer: Medicaid Other | Attending: Internal Medicine | Admitting: Internal Medicine

## 2016-09-29 ENCOUNTER — Emergency Department (HOSPITAL_COMMUNITY): Payer: Medicaid Other

## 2016-09-29 DIAGNOSIS — R739 Hyperglycemia, unspecified: Secondary | ICD-10-CM | POA: Diagnosis present

## 2016-09-29 DIAGNOSIS — R740 Nonspecific elevation of levels of transaminase and lactic acid dehydrogenase [LDH]: Secondary | ICD-10-CM

## 2016-09-29 DIAGNOSIS — N9089 Other specified noninflammatory disorders of vulva and perineum: Secondary | ICD-10-CM

## 2016-09-29 DIAGNOSIS — R7401 Elevation of levels of liver transaminase levels: Secondary | ICD-10-CM | POA: Diagnosis present

## 2016-09-29 DIAGNOSIS — S3993XA Unspecified injury of pelvis, initial encounter: Secondary | ICD-10-CM | POA: Diagnosis present

## 2016-09-29 DIAGNOSIS — Z79899 Other long term (current) drug therapy: Secondary | ICD-10-CM | POA: Insufficient documentation

## 2016-09-29 DIAGNOSIS — T148XXA Other injury of unspecified body region, initial encounter: Secondary | ICD-10-CM | POA: Diagnosis present

## 2016-09-29 DIAGNOSIS — Y9389 Activity, other specified: Secondary | ICD-10-CM | POA: Diagnosis not present

## 2016-09-29 DIAGNOSIS — E871 Hypo-osmolality and hyponatremia: Secondary | ICD-10-CM | POA: Diagnosis present

## 2016-09-29 DIAGNOSIS — W1809XA Striking against other object with subsequent fall, initial encounter: Secondary | ICD-10-CM | POA: Insufficient documentation

## 2016-09-29 DIAGNOSIS — Y999 Unspecified external cause status: Secondary | ICD-10-CM | POA: Diagnosis not present

## 2016-09-29 DIAGNOSIS — S3023XA Contusion of vagina and vulva, initial encounter: Secondary | ICD-10-CM | POA: Diagnosis not present

## 2016-09-29 DIAGNOSIS — D649 Anemia, unspecified: Secondary | ICD-10-CM | POA: Diagnosis present

## 2016-09-29 DIAGNOSIS — I10 Essential (primary) hypertension: Secondary | ICD-10-CM | POA: Insufficient documentation

## 2016-09-29 DIAGNOSIS — Y929 Unspecified place or not applicable: Secondary | ICD-10-CM | POA: Insufficient documentation

## 2016-09-29 DIAGNOSIS — E876 Hypokalemia: Secondary | ICD-10-CM | POA: Diagnosis present

## 2016-09-29 LAB — CBC
HCT: 25.9 % — ABNORMAL LOW (ref 36.0–46.0)
Hemoglobin: 8.9 g/dL — ABNORMAL LOW (ref 12.0–15.0)
MCH: 32.5 pg (ref 26.0–34.0)
MCHC: 34.4 g/dL (ref 30.0–36.0)
MCV: 94.5 fL (ref 78.0–100.0)
PLATELETS: 150 10*3/uL (ref 150–400)
RBC: 2.74 MIL/uL — AB (ref 3.87–5.11)
RDW: 15.3 % (ref 11.5–15.5)
WBC: 6.4 10*3/uL (ref 4.0–10.5)

## 2016-09-29 LAB — COMPREHENSIVE METABOLIC PANEL
ALT: 20 U/L (ref 14–54)
AST: 53 U/L — ABNORMAL HIGH (ref 15–41)
Albumin: 4.1 g/dL (ref 3.5–5.0)
Alkaline Phosphatase: 47 U/L (ref 38–126)
Anion gap: 11 (ref 5–15)
BUN: 8 mg/dL (ref 6–20)
CO2: 25 mmol/L (ref 22–32)
Calcium: 9.3 mg/dL (ref 8.9–10.3)
Chloride: 93 mmol/L — ABNORMAL LOW (ref 101–111)
Creatinine, Ser: 0.89 mg/dL (ref 0.44–1.00)
GFR calc Af Amer: >60 mL/min (ref >60)
GFR calc non Af Amer: >60 mL/min (ref >60)
Glucose, Bld: 128 mg/dL — ABNORMAL HIGH (ref 65–99)
Potassium: 3.2 mmol/L — ABNORMAL LOW (ref 3.5–5.1)
Sodium: 129 mmol/L — ABNORMAL LOW (ref 135–145)
Total Bilirubin: 0.5 mg/dL (ref 0.3–1.2)
Total Protein: 7.5 g/dL (ref 6.5–8.1)

## 2016-09-29 LAB — CBC WITH DIFFERENTIAL/PLATELET
Basophils Absolute: 0 10*3/uL (ref 0.0–0.1)
Basophils Relative: 0 %
Eosinophils Absolute: 0.1 10*3/uL (ref 0.0–0.7)
Eosinophils Relative: 1 %
HCT: 27.6 % — ABNORMAL LOW (ref 36.0–46.0)
Hemoglobin: 9.6 g/dL — ABNORMAL LOW (ref 12.0–15.0)
Lymphocytes Relative: 29 %
Lymphs Abs: 1.6 10*3/uL (ref 0.7–4.0)
MCH: 33 pg (ref 26.0–34.0)
MCHC: 34.8 g/dL (ref 30.0–36.0)
MCV: 94.8 fL (ref 78.0–100.0)
Monocytes Absolute: 0.5 10*3/uL (ref 0.1–1.0)
Monocytes Relative: 9 %
Neutro Abs: 3.4 10*3/uL (ref 1.7–7.7)
Neutrophils Relative %: 61 %
Platelets: 150 10*3/uL (ref 150–400)
RBC: 2.91 MIL/uL — ABNORMAL LOW (ref 3.87–5.11)
RDW: 14.9 % (ref 11.5–15.5)
WBC: 5.5 10*3/uL (ref 4.0–10.5)

## 2016-09-29 LAB — PROTIME-INR
INR: 1.27
Prothrombin Time: 16 seconds — ABNORMAL HIGH (ref 11.4–15.2)

## 2016-09-29 MED ORDER — LACTATED RINGERS IV SOLN
INTRAVENOUS | Status: DC
  Administered 2016-09-29 – 2016-09-30 (×2): via INTRAVENOUS

## 2016-09-29 MED ORDER — ACETAMINOPHEN 325 MG PO TABS: 650.0000 mg | ORAL_TABLET | Freq: Four times a day (QID) | ORAL | Status: DC | PRN

## 2016-09-29 MED ORDER — MORPHINE SULFATE (PF) 2 MG/ML IV SOLN
2.0000 mg | INTRAVENOUS | Status: DC | PRN
Start: 2016-09-29 — End: 2016-09-30
  Administered 2016-09-29: 2 mg via INTRAVENOUS
  Filled 2016-09-29: qty 1

## 2016-09-29 MED ORDER — POTASSIUM CHLORIDE CRYS ER 20 MEQ PO TBCR
40.0000 meq | EXTENDED_RELEASE_TABLET | Freq: Once | ORAL | Status: AC
  Administered 2016-09-29: 40 meq via ORAL
  Filled 2016-09-29: qty 2

## 2016-09-29 MED ORDER — THIAMINE HCL 100 MG/ML IJ SOLN: 100.0000 mg | Freq: Every day | INTRAMUSCULAR | Status: DC

## 2016-09-29 MED ORDER — SODIUM CHLORIDE 0.9 % IV BOLUS (SEPSIS)
500.0000 mL | Freq: Once | INTRAVENOUS | Status: AC
  Administered 2016-09-29: 500 mL via INTRAVENOUS

## 2016-09-29 MED ORDER — ONDANSETRON HCL 4 MG PO TABS: 4.0000 mg | ORAL_TABLET | Freq: Four times a day (QID) | ORAL | Status: DC | PRN

## 2016-09-29 MED ORDER — IOPAMIDOL (ISOVUE-370) INJECTION 76%
100.0000 mL | Freq: Once | INTRAVENOUS | Status: AC | PRN
  Administered 2016-09-29: 100 mL via INTRAVENOUS

## 2016-09-29 MED ORDER — LORAZEPAM 2 MG/ML IJ SOLN: 1.0000 mg | Freq: Four times a day (QID) | INTRAMUSCULAR | Status: DC | PRN

## 2016-09-29 MED ORDER — ADULT MULTIVITAMIN W/MINERALS CH
1.0000 | ORAL_TABLET | Freq: Every day | ORAL | Status: DC
  Administered 2016-09-30: 1 via ORAL
  Filled 2016-09-29: qty 1

## 2016-09-29 MED ORDER — METOPROLOL TARTRATE 50 MG PO TABS
50.0000 mg | ORAL_TABLET | Freq: Two times a day (BID) | ORAL | Status: DC
  Administered 2016-09-29 – 2016-09-30 (×2): 50 mg via ORAL
  Filled 2016-09-29 (×2): qty 1

## 2016-09-29 MED ORDER — MORPHINE SULFATE (PF) 4 MG/ML IV SOLN
4.0000 mg | Freq: Once | INTRAVENOUS | Status: AC
  Administered 2016-09-29: 4 mg via INTRAVENOUS
  Filled 2016-09-29: qty 1

## 2016-09-29 MED ORDER — ONDANSETRON HCL 4 MG/2ML IJ SOLN
4.0000 mg | Freq: Once | INTRAMUSCULAR | Status: AC
  Administered 2016-09-29: 4 mg via INTRAVENOUS
  Filled 2016-09-29: qty 2

## 2016-09-29 MED ORDER — ONDANSETRON HCL 4 MG/2ML IJ SOLN: 4.0000 mg | Freq: Four times a day (QID) | INTRAMUSCULAR | Status: DC | PRN

## 2016-09-29 MED ORDER — ACETAMINOPHEN 650 MG RE SUPP: 650.0000 mg | Freq: Four times a day (QID) | RECTAL | Status: DC | PRN

## 2016-09-29 MED ORDER — LORAZEPAM 1 MG PO TABS: 1.0000 mg | ORAL_TABLET | Freq: Four times a day (QID) | ORAL | Status: DC | PRN

## 2016-09-29 MED ORDER — VITAMIN B-1 100 MG PO TABS
100.0000 mg | ORAL_TABLET | Freq: Every day | ORAL | Status: DC
  Administered 2016-09-30: 100 mg via ORAL
  Filled 2016-09-29: qty 1

## 2016-09-29 MED ORDER — FOLIC ACID 1 MG PO TABS
1.0000 mg | ORAL_TABLET | Freq: Every day | ORAL | Status: DC
  Administered 2016-09-30: 1 mg via ORAL
  Filled 2016-09-29: qty 1

## 2016-09-29 NOTE — ED Provider Notes (Signed)
[facility] DEPT Provider Note   CSN: [id number] Arrival date & time: 09/29/16  1459     History   Chief Complaint Chief Complaint  Patient presents with  . Fall    HPI Judy Davis is a 61 y.o. female.  HPI   61 year old female with pain and swelling to her left groin. She is moving firewood earlier today. She stepped backwards, lost her balance and a piece of wood struck in the left groin region when she fell. She's had significant pain and swelling since then. She feels that the swelling has been slowly increasing. This accident happened approximately 1 hour prior to arrival. She is not having any blood thinners. She denies any other injuries. She has not tried urinating since the fall. No bleeding that she has noticed. She has been icing the area.   Past Medical History:  Diagnosis Date  . Alcohol use   . Bacterial vaginosis   . DUB (dysfunctional uterine bleeding) 1970's   S/p hysterectomy   . HTN (hypertension)     Patient Active Problem List   Diagnosis Date Noted  . Upper respiratory infection 07/06/2011  . UTI (lower urinary tract infection) 07/06/2011  . Nausea and vomiting 07/05/2011  . Hyponatremia 07/05/2011  . HTN (hypertension) 07/05/2011  . Anemia 07/05/2011    Past Surgical History:  Procedure Laterality Date  . ABDOMINAL HYSTERECTOMY  1970's    OB History    No data available       Home Medications    Prior to Admission medications   Medication Sig Start Date End Date Taking? Authorizing Provider  cephALEXin (KEFLEX) 500 MG capsule Take 1 capsule (500 mg total) by mouth 2 (two) times daily. 05/10/15   Glendell Docker, NP  HYDROcodone-acetaminophen (NORCO) 5-325 MG per tablet Take 1-2 tablets by mouth every 4 (four) hours as needed. 04/21/14   Veryl Speak, MD  omeprazole (PRILOSEC) 20 MG capsule Take 1 capsule (20 mg total) by mouth 2 (two) times daily. 04/21/14   Veryl Speak, MD    Family History History reviewed. No pertinent  family history.  Social History Social History  Substance Use Topics  . Smoking status: Never Smoker  . Smokeless tobacco: Never Used  . Alcohol use Yes     Comment: "beer every now and then"     Allergies   Patient has no known allergies.   Review of Systems Review of Systems   All systems reviewed and negative, other than as noted in HPI.    Physical Exam Updated Vital Signs BP 136/87 (BP Location: Left Arm)   Pulse 111   Temp 97.6 F (36.4 C) (Oral)   Resp 18   Ht 5\' 6"  (1.676 m)   Wt 132 lb (59.9 kg)   SpO2 97%   BMI 21.31 kg/m   Physical Exam  Constitutional: She appears well-developed and well-nourished. No distress.  HENT:  Head: Normocephalic and atraumatic.  Eyes: Conjunctivae are normal. Right eye exhibits no discharge. Left eye exhibits no discharge.  Neck: Neck supple.  Cardiovascular: Normal rate, regular rhythm and normal heart sounds.  Exam reveals no gallop and no friction rub.   No murmur heard. Pulmonary/Chest: Effort normal and breath sounds normal. No respiratory distress.  Abdominal: Soft. She exhibits no distension. There is no tenderness.  Genitourinary:  Genitourinary Comments: Very large hematoma of L labia majora. This extends medially to the introitus, inferiorly to almost the anus and superiorly to the mons to about the level of the  pubic symphysis. It does not extend laterally to the femoral artery/vein. L femoral artery has a strong pulse which is symmetric to the R. There is a tiny punctate wound along the lateral aspect of the hematoma. No active bleeding from it and no visible FB.  I do not see any apparent injury to the anus or wounds along introitus.   Musculoskeletal: She exhibits no edema or tenderness.  Can actively range L hip. Has increased pain but says that the pain is at the site of the hematoma. NVI. No midline spinal tenderness.   Neurological: She is alert.  Skin: Skin is warm and dry.  Psychiatric: She has a normal  mood and affect. Her behavior is normal. Thought content normal.  Nursing note and vitals reviewed.    ED Treatments / Results  Labs (all labs ordered are listed, but only abnormal results are displayed) Labs Reviewed  CBC WITH DIFFERENTIAL/PLATELET - Abnormal; Notable for the following:       Result Value   RBC 2.91 (*)    Hemoglobin 9.6 (*)    HCT 27.6 (*)    All other components within normal limits  COMPREHENSIVE METABOLIC PANEL - Abnormal; Notable for the following:    Sodium 129 (*)    Potassium 3.2 (*)    Chloride 93 (*)    Glucose, Bld 128 (*)    AST 53 (*)    All other components within normal limits  PROTIME-INR - Abnormal; Notable for the following:    Prothrombin Time 16.0 (*)    All other components within normal limits    EKG  EKG Interpretation None       Radiology Ct Angio Pelvis W And/or Wo Contrast  Addendum Date: 09/29/2016   ADDENDUM REPORT: 09/29/2016 18:02 ADDENDUM: This case was further discussed by telephone with Dr. Wilson Singer. He reports no open wound, making foreign bodies along the inferior aspect of the hematoma less likely. Therefore, these could reflect small foci of active bleeding from a small vessel injury. An avulsion injury from the inferior aspect of the symphysis pubis seems unlikely as these are displaced inferiorly from that by more than 3.5 cm. Electronically Signed   By: Richardean Sale M.D.   On: 09/29/2016 18:02   Result Date: 09/29/2016 CLINICAL DATA:  Fall today with penetrating injury in the left inguinal region. Enlarging painful area. Evaluate for vascular injury. EXAM: CT ANGIOGRAPHY OF PELVIS TECHNIQUE: Multidetector CT imaging of the pelvis was performed before and during bolus injection of intravenous contrast. Multiplanar CT angiographic image reconstructions including MIPs were generated to evaluate the vascular anatomy. CONTRAST:  100 ml Isovue 370. COMPARISON:  Noncontrast abdominopelvic CT 05/10/2015. FINDINGS: Urinary Tract:  The visualized distal ureters appear unremarkable. The bladder is distended without wall thickening or evidence of injury. Bowel: No bowel wall thickening, distention or surrounding inflammation identified within the pelvis. Vascular/Lymphatic: No evidence of acute large vessel injury. The iliac arteries appear intact bilaterally. The venous structures are not opacified, although appear intact. There are no enlarged pelvic or inguinal lymph nodes. Reproductive: Hysterectomy. No evidence of adnexal mass. Probable residual ovarian tissue bilaterally, stable. Multiple pelvic phleboliths are noted. Other: There is soft tissue stranding in the subcutaneous fat of the anterior perineum with a large hematoma along the left aspect of the mons pubis and vaginal introitus. This measures up to 5.8 x 4.5 cm transverse (image 79) and extends up to 7.8 cm on sagittal image 254. Along the inferior aspect of this hematoma, there  are 2 small radiodensities (axial images 83 and 85), probably small foreign bodies. Given the absence of sizeable vessels in this area, active bleeding considered unlikely. There is no intrapelvic hematoma or other fluid collection. There is a small amount of fluid and air within the vagina. Musculoskeletal: No acute or worrisome osseous findings. Review of the MIP images confirms the above findings. IMPRESSION: 1. Large hematoma along the left aspect of the mons pubis and the vaginal introitus with possible foreign bodies along its inferior aspect. 2. No evidence of large vessel or intrapelvic injury. Electronically Signed: By: Richardean Sale M.D. On: 09/29/2016 17:31    Procedures Procedures (including critical care time)  Medications Ordered in ED Medications - No data to display   Initial Impression / Assessment and Plan / ED Course  I have reviewed the triage vital signs and the nursing notes.  Pertinent labs & imaging results that were available during my care of the patient were  reviewed by me and considered in my medical decision making (see chart for details).  Clinical Course     61 year old female with a very large and tense hematoma to the left labia majora, mons and extending up to almost the inguinal crease. She has a strong femoral pulse. The hematoma is nonpulsatile. One tiny punctate area long the lateral aspect without active bleeding. She is not anticoagulated. Given the size of this hematoma and she feels like it has progressively expanded, will obtain a CT angiogram of the area. Will make sure she can void. Treat pain as needed. Continue to ice.   5:20 PM Bladder is pretty distended. She says she doesn't feel like she has to go though. Will have her try to urinate. If she cannot, will place foley.  Will call surgery pending radiology read.  5:50 PM I discussed films with radiology. He really doesn't think areas noted in his read are active extravasation. Clinically I cannot clearly correlate exam to these being foreign bodies. I reviewed the images again myself. To me, it looks like there may be a small vessel along the posterior and lateral aspect of the hematoma (saggital images 63/64, coronal 18-42) She now has some skin break down over the hematoma that she did not have on my first and second evaluations. I cannot say that the hematoma is necessarily larger in size though. Will discuss with surgery. Pt was able to ambulate to the bathroom and urinate.   6:15 PM I discussed with Dr Rosana Hoes, surgery, who also reviewed the imaging. At this time, this is not something he feels would benefit from surgical intervention. There is no significant named vessel in this area and doesn't feel IR intervention would be of much benefit at this time either. Recommending admission for observation and following exam and serial H/H.  She is anemic. No signifciant anemia on prior labs, but comparisons from several years ago.      Final Clinical Impressions(s) / ED Diagnoses    Final diagnoses:  Hematoma of labia majora    New Prescriptions New Prescriptions   No medications on file     Virgel Manifold, MD 09/29/16 [phone]

## 2016-09-29 NOTE — ED Triage Notes (Signed)
Patient states she fell today and "I fell back on a stick of wood and it stuck me in my groin area to the left of my vagina area." Patient ambulatory at triage but states it's painful to sit down.

## 2016-09-29 NOTE — H&P (Signed)
History and Physical    NAKISHIA Davis [id number] DOB: Oct 22, 1955 DOA: 09/29/2016  PCP: [place] Department Consultants:  None Patient coming from: Home - lives with husband; NOK: Sister, [phone]  Chief Complaint: fall  HPI: Judy Davis is a 61 y.o. female with medical history significant of HTN presenting with groin injury after a fall.  Patient was trying to help to get some wood and she fell and landed on the wood.  It bruised her up in the vaginal area.  Happened really early this AM.  Went into the house and got ready to go to the bathroom and it started swelling.  Called her sister and asked her to bring her to the hospital.  Slight headache where her head hit the ground.   ED Course: Per Dr. Wilson Singer: 61 year old female with a very large and tense hematoma to the left labia majora, mons and extending up to almost the inguinal crease. She has a strong femoral pulse. The hematoma is nonpulsatile. One tiny punctate area long the lateral aspect without active bleeding. She is not anticoagulated. Given the size of this hematoma and she feels like it has progressively expanded, will obtain a CT angiogram of the area. Will make sure she can void. Treat pain as needed. Continue to ice.  5:20 PM  Bladder is pretty distended. She says she doesn't feel like she has to go though. Will have her try to urinate. If she cannot, will place foley. Will call surgery pending radiology read.  5:50 PM  I discussed films with radiology. He really doesn't think areas noted in his read are active extravasation. Clinically I cannot clearly correlate exam to these being foreign bodies. I reviewed the images again myself. To me, it looks like there may be a small vessel along the posterior and lateral aspect of the hematoma (saggital images 63/64, coronal 18-42) She now has some skin break down over the hematoma that she did not have on my first and second evaluations. I cannot say that the  hematoma is necessarily larger in size though. Will discuss with surgery. Pt was able to ambulate to the bathroom and urinate.  6:15 PM  I discussed with Dr Rosana Hoes, surgery, who also reviewed the imaging. At this time, this is not something he feels would benefit from surgical intervention. There is no significant named vessel in this area and doesn't feel IR intervention would be of much benefit at this time either. Recommending admission for observation and following exam and serial H/H. She is anemic. No signifciant anemia on prior labs, but comparisons from several years ago.    Review of Systems: As per HPI; otherwise 10 point review of systems reviewed and negative.   Ambulatory Status:  Ambulates independently  Past Medical History:  Diagnosis Date  . HTN (hypertension)     Past Surgical History:  Procedure Laterality Date  . ABDOMINAL HYSTERECTOMY  1970's    Social History   Social History  . Marital status: Married    Spouse name: N/A  . Number of children: N/A  . Years of education: N/A   Occupational History  . unemployed    Social History Main Topics  . Smoking status: Never Smoker  . Smokeless tobacco: Never Used  . Alcohol use Yes     Comment: 2-3 beers a day when she can afford it  . Drug use: No  . Sexual activity: Not on file   Other Topics Concern  . Not on file  Social History Narrative  . No narrative on file    No Known Allergies  Family History  Problem Relation Age of Onset  . Other Mother     Homicide    Prior to Admission medications   Medication Sig Start Date End Date Taking? Authorizing Provider  metoprolol (LOPRESSOR) 50 MG tablet Take 50 mg by mouth 2 (two) times daily.   Yes Historical Provider, MD    Physical Exam: Vitals:   09/29/16 1800 09/29/16 1830 09/29/16 1900 09/29/16 2027  BP: 136/84 119/76 138/82 (!) 154/76  Pulse: 88 87 88 91  Resp:   18 18  Temp:    98.2 F (36.8 C)  TempSrc:    Oral  SpO2: 98% 97% 97% 98%    Weight:    53.9 kg (118 lb 13.3 oz)  Height:    5\' 7"  (1.702 m)     General: Appears calm and comfortable and is NAD Eyes:  PERRL, EOMI, normal lids, iris ENT:  grossly normal hearing, lips & tongue, mmm Neck:  no LAD, masses or thyromegaly Cardiovascular:  RRR, no m/r/g. No LE edema.  Respiratory: CTA bilaterally, no w/r/r. Normal respiratory effort. Abdomen:  soft, ntnd, NABS Skin:  Marked vulvar and labial (left > right) edema, firmness, now with some frank bleeding from the superior introitus Musculoskeletal:  grossly normal tone BUE/BLE, good ROM, no bony abnormality Psychiatric:  grossly normal mood and affect, speech fluent and appropriate, AOx3 Neurologic:  CN 2-12 grossly intact, moves all extremities in coordinated fashion, sensation intact  Labs on Admission: I have personally reviewed following labs and imaging studies  CBC:  Recent Labs Lab 09/29/16 1550 09/29/16 2117  WBC 5.5 6.4  NEUTROABS 3.4  --   HGB 9.6* 8.9*  HCT 27.6* 25.9*  MCV 94.8 94.5  PLT 150 [id number]   Basic Metabolic Panel:  Recent Labs Lab 09/29/16 1550  NA 129*  K 3.2*  CL 93*  CO2 25  GLUCOSE 128*  BUN 8  CREATININE 0.89  CALCIUM 9.3   GFR: Estimated Creatinine Clearance: 56.5 mL/min (by C-G formula based on SCr of 0.89 mg/dL). Liver Function Tests:  Recent Labs Lab 09/29/16 1550  AST 53*  ALT 20  ALKPHOS 47  BILITOT 0.5  PROT 7.5  ALBUMIN 4.1   No results for input(s): LIPASE, AMYLASE in the last 168 hours. No results for input(s): AMMONIA in the last 168 hours. Coagulation Profile:  Recent Labs Lab 09/29/16 1550  INR 1.27   Cardiac Enzymes: No results for input(s): CKTOTAL, CKMB, CKMBINDEX, TROPONINI in the last 168 hours. BNP (last 3 results) No results for input(s): PROBNP in the last 8760 hours. HbA1C: No results for input(s): HGBA1C in the last 72 hours. CBG: No results for input(s): GLUCAP in the last 168 hours. Lipid Profile: No results for input(s):  CHOL, HDL, LDLCALC, TRIG, CHOLHDL, LDLDIRECT in the last 72 hours. Thyroid Function Tests: No results for input(s): TSH, T4TOTAL, FREET4, T3FREE, THYROIDAB in the last 72 hours. Anemia Panel: No results for input(s): VITAMINB12, FOLATE, FERRITIN, TIBC, IRON, RETICCTPCT in the last 72 hours. Urine analysis:    Component Value Date/Time   COLORURINE YELLOW 05/10/2015 1000   APPEARANCEUR CLEAR 05/10/2015 1000   LABSPEC >1.030 (H) 05/10/2015 1000   PHURINE 5.0 05/10/2015 1000   GLUCOSEU NEGATIVE 05/10/2015 1000   HGBUR TRACE (A) 05/10/2015 1000   BILIRUBINUR NEGATIVE 05/10/2015 1000   KETONESUR NEGATIVE 05/10/2015 1000   PROTEINUR 30 (A) 05/10/2015 1000   UROBILINOGEN 0.2  05/10/2015 1000   NITRITE POSITIVE (A) 05/10/2015 1000   LEUKOCYTESUR TRACE (A) 05/10/2015 1000    Creatinine Clearance: Estimated Creatinine Clearance: 56.5 mL/min (by C-G formula based on SCr of 0.89 mg/dL).  Sepsis Labs: @LABRCNTIP (procalcitonin:4,lacticidven:4) )No results found for this or any previous visit (from the past 240 hour(s)).   Radiological Exams on Admission: Ct Angio Pelvis W And/or Wo Contrast  Addendum Date: 09/29/2016   ADDENDUM REPORT: 09/29/2016 18:02 ADDENDUM: This case was further discussed by telephone with Dr. Wilson Singer. He reports no open wound, making foreign bodies along the inferior aspect of the hematoma less likely. Therefore, these could reflect small foci of active bleeding from a small vessel injury. An avulsion injury from the inferior aspect of the symphysis pubis seems unlikely as these are displaced inferiorly from that by more than 3.5 cm. Electronically Signed   By: Richardean Sale M.D.   On: 09/29/2016 18:02   Result Date: 09/29/2016 CLINICAL DATA:  Fall today with penetrating injury in the left inguinal region. Enlarging painful area. Evaluate for vascular injury. EXAM: CT ANGIOGRAPHY OF PELVIS TECHNIQUE: Multidetector CT imaging of the pelvis was performed before and during  bolus injection of intravenous contrast. Multiplanar CT angiographic image reconstructions including MIPs were generated to evaluate the vascular anatomy. CONTRAST:  100 ml Isovue 370. COMPARISON:  Noncontrast abdominopelvic CT 05/10/2015. FINDINGS: Urinary Tract: The visualized distal ureters appear unremarkable. The bladder is distended without wall thickening or evidence of injury. Bowel: No bowel wall thickening, distention or surrounding inflammation identified within the pelvis. Vascular/Lymphatic: No evidence of acute large vessel injury. The iliac arteries appear intact bilaterally. The venous structures are not opacified, although appear intact. There are no enlarged pelvic or inguinal lymph nodes. Reproductive: Hysterectomy. No evidence of adnexal mass. Probable residual ovarian tissue bilaterally, stable. Multiple pelvic phleboliths are noted. Other: There is soft tissue stranding in the subcutaneous fat of the anterior perineum with a large hematoma along the left aspect of the mons pubis and vaginal introitus. This measures up to 5.8 x 4.5 cm transverse (image 79) and extends up to 7.8 cm on sagittal image 254. Along the inferior aspect of this hematoma, there are 2 small radiodensities (axial images 83 and 85), probably small foreign bodies. Given the absence of sizeable vessels in this area, active bleeding considered unlikely. There is no intrapelvic hematoma or other fluid collection. There is a small amount of fluid and air within the vagina. Musculoskeletal: No acute or worrisome osseous findings. Review of the MIP images confirms the above findings. IMPRESSION: 1. Large hematoma along the left aspect of the mons pubis and the vaginal introitus with possible foreign bodies along its inferior aspect. 2. No evidence of large vessel or intrapelvic injury. Electronically Signed: By: Richardean Sale M.D. On: 09/29/2016 17:31    EKG: not done  Assessment/Plan Principal Problem:   Hematoma Active  Problems:   Hyponatremia   HTN (hypertension)   Anemia   Elevated AST (SGOT)   Hyperglycemia   Hypokalemia   Hematoma -Patient with a large mons pubis/labial hematoma -She was discussed with surgery and they do not see indication for intervention at this time -Hgb 9.6, repeat 8.9 -Hemoglobin is trending down - likely as a result of bleeding slowly into the hematoma - and she may require transfusion at some point -Will observe overnight with plan for surgery consultation in the AM -Recheck Hgb at midnight and call result to APP, and then recheck CBC in AM -Pain control with morphine  Hyponatremia -Na 129, prior 130 -Chronic, stable -No intervention at this time  Hypokalemia -K 3.2 -Will give 40 mEq PO x 1 now and recheck in AM  Hypergylcemia -Glucose 128 -May be acute phase reactant -Will follow  Elevated AST -AST 53/ALT 20 -INR 1.27 -Suspect patient is downplaying her ETOH use -Will place on CIWA protocol  HTN -Continue Lopressor  DVT prophylaxis: SCDs Code Status: Full - confirmed with patient Family Communication: None present  Disposition Plan:  Home once clinically improved Consults called: General surgery  Admission status: It is my clinical opinion that referral for OBSERVATION is reasonable and necessary in this patient based on the above information provided. The aforementioned taken together are felt to place the patient at high risk for further clinical deterioration. However it is anticipated that the patient may be medically stable for discharge from the hospital within 24 to 48 hours.    Karmen Bongo MD [facility]  If 7PM-7AM, please contact night-coverage [email or web address] Password [email or web address]  09/29/2016, 10:30 PM

## 2016-09-30 DIAGNOSIS — T148XXA Other injury of unspecified body region, initial encounter: Secondary | ICD-10-CM | POA: Diagnosis not present

## 2016-09-30 LAB — BASIC METABOLIC PANEL
Anion gap: 7 (ref 5–15)
BUN: 7 mg/dL (ref 6–20)
CO2: 27 mmol/L (ref 22–32)
Calcium: 8.7 mg/dL — ABNORMAL LOW (ref 8.9–10.3)
Chloride: 96 mmol/L — ABNORMAL LOW (ref 101–111)
Creatinine, Ser: 0.59 mg/dL (ref 0.44–1.00)
GFR calc Af Amer: >60 mL/min (ref >60)
Glucose, Bld: 115 mg/dL — ABNORMAL HIGH (ref 65–99)
POTASSIUM: 3.9 mmol/L (ref 3.5–5.1)
SODIUM: 130 mmol/L — AB (ref 135–145)

## 2016-09-30 LAB — CBC
HEMATOCRIT: 24.7 % — AB (ref 36.0–46.0)
Hemoglobin: 8.4 g/dL — ABNORMAL LOW (ref 12.0–15.0)
MCH: 32.7 pg (ref 26.0–34.0)
MCHC: 34 g/dL (ref 30.0–36.0)
MCV: 96.1 fL (ref 78.0–100.0)
Platelets: 151 10*3/uL (ref 150–400)
RBC: 2.57 MIL/uL — ABNORMAL LOW (ref 3.87–5.11)
RDW: 15.3 % (ref 11.5–15.5)
WBC: 8.3 10*3/uL (ref 4.0–10.5)

## 2016-09-30 NOTE — Discharge Summary (Signed)
Physician Discharge Summary  Judy Davis [id number] DOB: 02/15/1956 DOA: 09/29/2016  PCP: No PCP Per Patient  Admit date: 09/29/2016 Discharge date: 09/30/2016  Time spent: 45 minutes  Recommendations for Outpatient Follow-up:  -We'll be discharge home today -Advised to follow-up with GYN in about 2 weeks   Discharge Diagnoses:  Principal Problem:   Hematoma Active Problems:   Hyponatremia   HTN (hypertension)   Anemia   Elevated AST (SGOT)   Hyperglycemia   Hypokalemia   Discharge Condition: Stable and improved  Filed Weights   09/29/16 1505 09/29/16 2027  Weight: 59.9 kg (132 lb) 53.9 kg (118 lb 13.3 oz)    History of present illness:  As per Dr. Lorin Mercy on 1/15: Judy Davis is a 61 y.o. female with medical history significant of HTN presenting with groin injury after a fall.  Patient was trying to help to get some wood and she fell and landed on the wood.  It bruised her up in the vaginal area.  Happened really early this AM.  Went into the house and got ready to go to the bathroom and it started swelling.  Called her sister and asked her to bring her to the hospital.  Slight headache where her head hit the ground.   ED Course: Per Dr. Wilson Singer: 61 year old female with a very large and tense hematoma to the left labia majora, mons and extending up to almost the inguinal crease. She has a strong femoral pulse. The hematoma is nonpulsatile. One tiny punctate area long the lateral aspect without active bleeding. She is not anticoagulated. Given the size of this hematoma and she feels like it has progressively expanded, will obtain a CT angiogram of the area. Will make sure she can void. Treat pain as needed. Continue to ice.  5:20 PM  Bladder is pretty distended. She says she doesn't feel like she has to go though. Will have her try to urinate. If she cannot, will place foley. Will call surgery pending radiology read.  5:50 PM  I discussed films with radiology.  He really doesn't think areas noted in his read are active extravasation. Clinically I cannot clearly correlate exam to these being foreign bodies. I reviewed the images again myself. To me, it looks like there may be a small vessel along the posterior and lateral aspect of the hematoma (saggital images 63/64, coronal 18-42) She now has some skin break down over the hematoma that she did not have on my first and second evaluations. I cannot say that the hematoma is necessarily larger in size though. Will discuss with surgery. Pt was able to ambulate to the bathroom and urinate.  6:15 PM  I discussed with Dr Rosana Hoes, surgery, who also reviewed the imaging. At this time, this is not something he feels would benefit from surgical intervention. There is no significant named vessel in this area and doesn't feel IR intervention would be of much benefit at this time either. Recommending admission for observation and following exam and serial H/H. She is anemic. No signifciant anemia on prior labs, but comparisons from several years ago.   Hospital Course:   Mons/Labial Hematoma -Due to traumatic injury. -Hb has been stable. -Advised to follow up OP with GYN for Hb recheck and local are check. -CT scan reviewed by surgery, no intervention required.  Procedures:  None   Consultations:  None  Discharge Instructions  Discharge Instructions    Diet - low sodium heart healthy  Complete by:  As directed    Increase activity slowly    Complete by:  As directed      Allergies as of 09/30/2016   No Known Allergies     Medication List    TAKE these medications   metoprolol 50 MG tablet Commonly known as:  LOPRESSOR Take 50 mg by mouth 2 (two) times daily.      No Known Allergies Follow-up Information    your primary doctor. Schedule an appointment as soon as possible for a visit in 1 week(s).   Why:  to follow up your blood counts           The results of significant diagnostics  from this hospitalization (including imaging, microbiology, ancillary and laboratory) are listed below for reference.    Significant Diagnostic Studies: Ct Angio Pelvis W And/or Wo Contrast  Addendum Date: 09/29/2016   ADDENDUM REPORT: 09/29/2016 18:02 ADDENDUM: This case was further discussed by telephone with Dr. Wilson Singer. He reports no open wound, making foreign bodies along the inferior aspect of the hematoma less likely. Therefore, these could reflect small foci of active bleeding from a small vessel injury. An avulsion injury from the inferior aspect of the symphysis pubis seems unlikely as these are displaced inferiorly from that by more than 3.5 cm. Electronically Signed   By: Richardean Sale M.D.   On: 09/29/2016 18:02   Result Date: 09/29/2016 CLINICAL DATA:  Fall today with penetrating injury in the left inguinal region. Enlarging painful area. Evaluate for vascular injury. EXAM: CT ANGIOGRAPHY OF PELVIS TECHNIQUE: Multidetector CT imaging of the pelvis was performed before and during bolus injection of intravenous contrast. Multiplanar CT angiographic image reconstructions including MIPs were generated to evaluate the vascular anatomy. CONTRAST:  100 ml Isovue 370. COMPARISON:  Noncontrast abdominopelvic CT 05/10/2015. FINDINGS: Urinary Tract: The visualized distal ureters appear unremarkable. The bladder is distended without wall thickening or evidence of injury. Bowel: No bowel wall thickening, distention or surrounding inflammation identified within the pelvis. Vascular/Lymphatic: No evidence of acute large vessel injury. The iliac arteries appear intact bilaterally. The venous structures are not opacified, although appear intact. There are no enlarged pelvic or inguinal lymph nodes. Reproductive: Hysterectomy. No evidence of adnexal mass. Probable residual ovarian tissue bilaterally, stable. Multiple pelvic phleboliths are noted. Other: There is soft tissue stranding in the subcutaneous fat of  the anterior perineum with a large hematoma along the left aspect of the mons pubis and vaginal introitus. This measures up to 5.8 x 4.5 cm transverse (image 79) and extends up to 7.8 cm on sagittal image 254. Along the inferior aspect of this hematoma, there are 2 small radiodensities (axial images 83 and 85), probably small foreign bodies. Given the absence of sizeable vessels in this area, active bleeding considered unlikely. There is no intrapelvic hematoma or other fluid collection. There is a small amount of fluid and air within the vagina. Musculoskeletal: No acute or worrisome osseous findings. Review of the MIP images confirms the above findings. IMPRESSION: 1. Large hematoma along the left aspect of the mons pubis and the vaginal introitus with possible foreign bodies along its inferior aspect. 2. No evidence of large vessel or intrapelvic injury. Electronically Signed: By: Richardean Sale M.D. On: 09/29/2016 17:31    Microbiology: No results found for this or any previous visit (from the past 240 hour(s)).   Labs: Basic Metabolic Panel:  Recent Labs Lab 09/29/16 1550 09/30/16 0435  NA 129* 130*  K 3.2* 3.9  CL 93* 96*  CO2 25 27  GLUCOSE 128* 115*  BUN 8 7  CREATININE 0.89 0.59  CALCIUM 9.3 8.7*   Liver Function Tests:  Recent Labs Lab 09/29/16 1550  AST 53*  ALT 20  ALKPHOS 47  BILITOT 0.5  PROT 7.5  ALBUMIN 4.1   No results for input(s): LIPASE, AMYLASE in the last 168 hours. No results for input(s): AMMONIA in the last 168 hours. CBC:  Recent Labs Lab 09/29/16 1550 09/29/16 2117 09/30/16 0435  WBC 5.5 6.4 8.3  NEUTROABS 3.4  --   --   HGB 9.6* 8.9* 8.4*  HCT 27.6* 25.9* 24.7*  MCV 94.8 94.5 96.1  PLT 150 150 151   Cardiac Enzymes: No results for input(s): CKTOTAL, CKMB, CKMBINDEX, TROPONINI in the last 168 hours. BNP: BNP (last 3 results) No results for input(s): BNP in the last 8760 hours.  ProBNP (last 3 results) No results for input(s): PROBNP  in the last 8760 hours.  CBG: No results for input(s): GLUCAP in the last 168 hours.     SignedLelon Frohlich  [facility] Pager: [phone] 09/30/2016, 4:16 PM

## 2016-09-30 NOTE — Progress Notes (Signed)
Patient is to be discharged home and in stable condition. Discharge instructions discussed with patient and family member. No changes were made to medication regimen and patient states she knows to follow up with PCP in a timely manner. No further questions or concerns at this time. Patient will be escorted out by staff.  Celestia Khat, RN

## 2017-03-30 ENCOUNTER — Ambulatory Visit (HOSPITAL_COMMUNITY)
Admission: RE | Admit: 2017-03-30 | Discharge: 2017-03-30 | Disposition: A | Discharge status: Home / Self Care | Payer: Medicaid Other | Source: Ambulatory Visit | Attending: *Deleted | Admitting: *Deleted

## 2017-03-30 ENCOUNTER — Other Ambulatory Visit (HOSPITAL_COMMUNITY): Payer: Self-pay | Admitting: *Deleted

## 2017-03-30 DIAGNOSIS — R0781 Pleurodynia: Secondary | ICD-10-CM | POA: Diagnosis present

## 2017-03-30 DIAGNOSIS — W19XXXA Unspecified fall, initial encounter: Secondary | ICD-10-CM

## 2017-03-30 DIAGNOSIS — Z8781 Personal history of (healed) traumatic fracture: Secondary | ICD-10-CM | POA: Diagnosis not present

## 2017-03-30 DIAGNOSIS — I700 Atherosclerosis of aorta: Secondary | ICD-10-CM | POA: Insufficient documentation

## 2018-03-18 ENCOUNTER — Emergency Department (HOSPITAL_COMMUNITY): Payer: Medicaid Other

## 2018-03-18 ENCOUNTER — Emergency Department (HOSPITAL_COMMUNITY)
Admission: EM | Admit: 2018-03-18 | Discharge: 2018-03-18 | Disposition: A | Discharge status: Home / Self Care | Payer: Medicaid Other | Attending: Emergency Medicine | Admitting: Emergency Medicine

## 2018-03-18 ENCOUNTER — Other Ambulatory Visit: Payer: Self-pay

## 2018-03-18 ENCOUNTER — Encounter (HOSPITAL_COMMUNITY): Payer: Self-pay

## 2018-03-18 DIAGNOSIS — W01198A Fall on same level from slipping, tripping and stumbling with subsequent striking against other object, initial encounter: Secondary | ICD-10-CM | POA: Diagnosis not present

## 2018-03-18 DIAGNOSIS — R55 Syncope and collapse: Secondary | ICD-10-CM | POA: Diagnosis not present

## 2018-03-18 DIAGNOSIS — I10 Essential (primary) hypertension: Secondary | ICD-10-CM | POA: Insufficient documentation

## 2018-03-18 DIAGNOSIS — S0993XA Unspecified injury of face, initial encounter: Secondary | ICD-10-CM | POA: Diagnosis present

## 2018-03-18 DIAGNOSIS — F1010 Alcohol abuse, uncomplicated: Secondary | ICD-10-CM | POA: Insufficient documentation

## 2018-03-18 DIAGNOSIS — S022XXA Fracture of nasal bones, initial encounter for closed fracture: Secondary | ICD-10-CM | POA: Insufficient documentation

## 2018-03-18 DIAGNOSIS — Y998 Other external cause status: Secondary | ICD-10-CM | POA: Diagnosis not present

## 2018-03-18 DIAGNOSIS — Z79899 Other long term (current) drug therapy: Secondary | ICD-10-CM | POA: Insufficient documentation

## 2018-03-18 DIAGNOSIS — Y92017 Garden or yard in single-family (private) house as the place of occurrence of the external cause: Secondary | ICD-10-CM | POA: Insufficient documentation

## 2018-03-18 DIAGNOSIS — Y9389 Activity, other specified: Secondary | ICD-10-CM | POA: Diagnosis not present

## 2018-03-18 LAB — URINALYSIS, ROUTINE W REFLEX MICROSCOPIC
Bilirubin Urine: NEGATIVE
Glucose, UA: NEGATIVE mg/dL
Hgb urine dipstick: NEGATIVE
Ketones, ur: NEGATIVE mg/dL
LEUKOCYTES UA: NEGATIVE
NITRITE: POSITIVE — AB
Protein, ur: 30 mg/dL — AB
SPECIFIC GRAVITY, URINE: 1.004 — AB (ref 1.005–1.030)
pH: 5 (ref 5.0–8.0)

## 2018-03-18 LAB — BASIC METABOLIC PANEL
ANION GAP: 10 (ref 5–15)
BUN: 8 mg/dL (ref 8–23)
CALCIUM: 9.2 mg/dL (ref 8.9–10.3)
CO2: 22 mmol/L (ref 22–32)
CREATININE: 0.68 mg/dL (ref 0.44–1.00)
Chloride: 105 mmol/L (ref 98–111)
GFR calc non Af Amer: >60 mL/min (ref >60)
Glucose, Bld: 100 mg/dL — ABNORMAL HIGH (ref 70–99)
Potassium: 3.4 mmol/L — ABNORMAL LOW (ref 3.5–5.1)
SODIUM: 137 mmol/L (ref 135–145)

## 2018-03-18 LAB — CBC WITH DIFFERENTIAL/PLATELET
Basophils Absolute: 0.1 10*3/uL (ref 0.0–0.1)
Basophils Relative: 1 %
Eosinophils Absolute: 0.1 10*3/uL (ref 0.0–0.7)
Eosinophils Relative: 2 %
HCT: 31.8 % — ABNORMAL LOW (ref 36.0–46.0)
HEMOGLOBIN: 10.5 g/dL — AB (ref 12.0–15.0)
LYMPHS PCT: 61 %
Lymphs Abs: 2.3 10*3/uL (ref 0.7–4.0)
MCH: 32.1 pg (ref 26.0–34.0)
MCHC: 33 g/dL (ref 30.0–36.0)
MCV: 97.2 fL (ref 78.0–100.0)
Monocytes Absolute: 0.3 10*3/uL (ref 0.1–1.0)
Monocytes Relative: 8 %
NEUTROS PCT: 28 %
Neutro Abs: 1.1 10*3/uL — ABNORMAL LOW (ref 1.7–7.7)
Platelets: 261 10*3/uL (ref 150–400)
RBC: 3.27 MIL/uL — AB (ref 3.87–5.11)
RDW: 14.8 % (ref 11.5–15.5)
WBC: 3.9 10*3/uL — AB (ref 4.0–10.5)

## 2018-03-18 LAB — ETHANOL: ALCOHOL ETHYL (B): 360 mg/dL — AB (ref <10)

## 2018-03-18 LAB — TROPONIN I: Troponin I: <0.03 ng/mL (ref <0.03)

## 2018-03-18 MED ORDER — MELOXICAM 7.5 MG PO TABS: 15.0000 mg | ORAL_TABLET | Freq: Every day | ORAL | 0 refills | Status: DC

## 2018-03-18 NOTE — ED Provider Notes (Signed)
[facility] EMERGENCY DEPARTMENT Provider Note   CSN: [id number] Arrival date & time: 03/18/18  1256     History   Chief Complaint Chief Complaint  Patient presents with  . Facial Injury  . Loss of Consciousness    HPI Judy Davis is a 62 y.o. female.  HPI  62 year old female with a known history of high blood pressure as well as a history of heavy alcohol use, drinks approximately 2 quarts of beer a day according to the patient.  She has Artie been drinking this morning, she did have some breakfast, she was in her usual state of health when she walked out of the house and had an apparent syncopal event where she fell forward striking her face on the ground.  She does not recall any of these events, does not recall any prodromal symptoms, has no symptoms at this time except for an intermittent chest pain which she has had for several days as well as a pain in her nose and a mild headache.  She is not on any anticoagulants, states that she takes her blood pressure medication as prescribed, metoprolol.  This syncopal event occurred just prior to arrival, symptoms resolved fairly quickly, she was seen by a neighbor who came over to help her get up.  She was noted to have some nosebleed, this stopped rather abruptly.  Past Medical History:  Diagnosis Date  . HTN (hypertension)     Patient Active Problem List   Diagnosis Date Noted  . Hematoma 09/29/2016  . Elevated AST (SGOT) 09/29/2016  . Hyperglycemia 09/29/2016  . Hypokalemia 09/29/2016  . Upper respiratory infection 07/06/2011  . UTI (lower urinary tract infection) 07/06/2011  . Nausea and vomiting 07/05/2011  . Hyponatremia 07/05/2011  . HTN (hypertension) 07/05/2011  . Anemia 07/05/2011    Past Surgical History:  Procedure Laterality Date  . ABDOMINAL HYSTERECTOMY  1970's     OB History   None      Home Medications    Prior to Admission medications   Medication Sig Start Date End Date Taking?  Authorizing Provider  metoprolol (LOPRESSOR) 50 MG tablet Take 50 mg by mouth 2 (two) times daily.   Yes [provider]  meloxicam (MOBIC) 7.5 MG tablet Take 2 tablets (15 mg total) by mouth daily. 03/18/18   Noemi Chapel, MD    Family History Family History  Problem Relation Age of Onset  . Other Mother        Homicide    Social History Social History   Tobacco Use  . Smoking status: Never Smoker  . Smokeless tobacco: Never Used  Substance Use Topics  . Alcohol use: Yes    Comment: 2-3 beers a day when she can afford it  . Drug use: No     Allergies   Patient has no known allergies.   Review of Systems Review of Systems  All other systems reviewed and are negative.    Physical Exam Updated Vital Signs BP (!) 158/93   Pulse 80   Temp 98.4 F (36.9 C) (Oral)   Resp 18   Ht 5\' 7"  (1.702 m)   Wt 58.1 kg (128 lb)   SpO2 100%   BMI 20.05 kg/m   Physical Exam  Constitutional:  The patient is in no acute distress, she is alert and oriented to location and date and name  HENT:  Mild redness to the right forehead, deformity and swelling over the nasal bridge, epistaxis has stopped but  there is some dried crusted blood around the edges of the nose, oropharynx is clear and moist without trismus  Eyes:  Normal extraocular movements, pupils are equal round reactive to light, no signs of disconjugate gaze or diplopia  Neck:  Supple neck with no tenderness posteriorly  Cardiovascular:  Regular rate and rhythm without murmurs, normal pulses at the radial arteries, no JVD  Pulmonary/Chest:  Lungs are clear to auscultation bilaterally without wheezing rhonchi rales or increased work of breathing  Abdominal:  Soft nontender abdomen  Musculoskeletal:  Moves all 4 extremities without any difficulty or deformity, soft compartments diffusely  Neurological:  The patient is alert, oriented and able to follow commands.  She does have a headache but has slurred speech,  she is able to grip with bilateral hands, lift bilateral legs, normal coordination, is not slurring her speech  Skin:  No lacerations, there is a the right forehead and swelling and bruising over the nasal bridge     ED Treatments / Results  Labs (all labs ordered are listed, but only abnormal results are displayed) Labs Reviewed  BASIC METABOLIC PANEL - Abnormal; Notable for the following components:      Result Value   Potassium 3.4 (*)    Glucose, Bld 100 (*)    All other components within normal limits  URINALYSIS, ROUTINE W REFLEX MICROSCOPIC - Abnormal; Notable for the following components:   APPearance HAZY (*)    Specific Gravity, Urine 1.004 (*)    Protein, ur 30 (*)    Nitrite POSITIVE (*)    Bacteria, UA RARE (*)    All other components within normal limits  CBC WITH DIFFERENTIAL/PLATELET - Abnormal; Notable for the following components:   WBC 3.9 (*)    RBC 3.27 (*)    Hemoglobin 10.5 (*)    HCT 31.8 (*)    Neutro Abs 1.1 (*)    All other components within normal limits  ETHANOL - Abnormal; Notable for the following components:   Alcohol, Ethyl (B) 360 (*)    All other components within normal limits  TROPONIN I    EKG EKG Interpretation  Date/Time:  Thursday March 18 2018 13:06:00 EDT Ventricular Rate:  95 PR Interval:    QRS Duration: 98 QT Interval:  406 QTC Calculation: 511 R Axis:   79 Text Interpretation:  Sinus rhythm Probable left ventricular hypertrophy Prolonged QT interval Since last tracing qt slightly longer today no other changes Confirmed by Noemi Chapel [id number]) on 03/18/2018 1:14:05 PM   Radiology No results found.  Procedures Procedures (including critical care time)  Medications Ordered in ED Medications - No data to display   Initial Impression / Assessment and Plan / ED Course  I have reviewed the triage vital signs and the nursing notes.  Pertinent labs & imaging results that were available during my care of the patient were  reviewed by me and considered in my medical decision making (see chart for details).  Clinical Course as of Mar 18 1537  Thu Mar 18, 2018  1434 Labs confirm that the patient has no obvious urinary tract infection, rare bacteria, 0-5 white blood cells.  She has a metabolic panel which is unremarkable, CBC with some anemia but no other significant findings, normal troponin but of note has an alcohol level of 360 which likely contributed to the patient's fall in some way   [BM]    Clinical Course User Index [BM] Noemi Chapel, MD   The exact cause of the  patient's symptoms is not clear however she has had a syncopal episode that resulted in a head injury after the fall.  She states it was not a mechanical fall, she did not remember tripping, she does not remember anything about the fall.  She reports that she is Artie started drinking alcohol today and smells slightly intoxicated.  Will check labs, cardiac monitoring, troponin, I am somewhat concerned that she has had chest pain for several days and then a syncopal episode today but I see no signs of ischemia on her EKG.  Change of shift, care signed out to Dr. Roderic Palau to follow-up results and disposition accordingly  Final Clinical Impressions(s) / ED Diagnoses   Final diagnoses:  Closed fracture of nasal bone, initial encounter  Syncope, unspecified syncope type  Alcohol abuse    ED Discharge Orders        Ordered    meloxicam (MOBIC) 7.5 MG tablet  Daily     03/18/18 1538       Noemi Chapel, MD 03/18/18 1538

## 2018-03-18 NOTE — ED Notes (Signed)
Pt does not want any visitors

## 2018-03-18 NOTE — Discharge Instructions (Signed)
Your testing shows that you have a likely nose fracture, please apply ice to this area, Mobic twice daily for pain See your doctor in the clinic for referral to a nose specialist if you are having ongoing nosebleeds, increasing pain or persistent pain or deformity or difficulty breathing through your nose.  Please reduce the amount of alcohol that you drink - see the below list of local substance abuse resources to help with your drinking.   Substance Abuse Treatment Programs  Intensive Outpatient Programs [facility]     [address], [place]       [facility] [place] [address] [place], [place]  [facility]     (Inpatient and outpatient)     [address]           [place] [phone] (Suboxone and Methadone)  [place], [place] [address]      [place] [address] [place], [place]  [facility] (Outpatient/Inpatient, Chemical)    (insurance only) [phone]             [facility] ([place]) [place], [place]     [facility]     [address]     [place], [place]       [facility] (for caregivers and family) [phone] [address] [place]een [place]. [place], [place]      Residential Treatment Programs [facility]      [address], [place], [place] [address]  [phone]       [address] [place], [place] [address] [phone]  [facility] [place]        [phone]       [facility] [phone]  [facility] ([place]             [place], [place] or [place] of  [place] [address] [place], [address] [phone]  [facility]    [address]      [place], [place]       [facility] [address] [place], [place]  [place]   [address] [place], [place] [address]     [phone]      Admissions: 8am-3pm M-F  [facility] (RTS) [address] [place], [place]  [facility] Program: Residential Program [phone]  Days)   [place], [place] or [phone]     [facility]: [place], [place] (Walk in Hours over the weekend or by referral)  [facility] [place], [place], [place] [address] [phone]  Crisis Mobile: [facility]:  [phone] (for crisis response 24 hours a day) [facility] Hotline:      3361983434 Outpatient Psychiatry and Counseling  [facility]: Mobile Crisis Management 24 hours:  [phone]  [facility] of the [employer] sliding scale fee and walk in schedule: M-F 8am-12pm/1pm-3pm [place], [place] [address] [address] [place], [place] [address] [phone]  [facility] ([facility] [employer])- new patient walk-in appointments available Monday - Friday 8am -3pm.          [address] [place], [place] [address] [phone] or crisis line- [facility]/ Intensive Outpatient Therapy Program [place], [place] [address] [place]      [phone] [address] [place], [place] [address]                 [place]   [facility] [phone]. [address] [place], [place] [address]   [employer] of  Care          [address] Johnette Abraham  [place], [place] [address]       [phone]  [facility] [address] [place], [place] [address] [phone]  [facility]    [address] [place], [place] [address]     [place], [place] Joycelyn Man     [place] [address]     [phone]       [facility] [place] [address]  [facility]     [address], [place], [place], [place] [address] [place]     [address]     [place], [place] [address]     [phone]       [facility] [address] [place], [place] [address] [phone] Resources for Temporary Residential Assistance/Crisis [facility]) M-F 8am-3pm   [address], [place] [address]   [phone] Services include: laundry, barbering, support groups, case management, phone  & computer access, showers, AA/NA mtgs, mental health/substance abuse nurse, job skills class, disability information, VA assistance, spiritual classes, etc.   [facility]   [address] [place]     [place]              [employer] (women and children)       [place]. [place], [place] [address] [phone] [email or web address] for application and process Application Required  [facility]   [address]  [address]    [place] [address]     [phone]                    [place], [place] [address] [phone](JEHUDJSH application appt.) Application Required  [facility] (women only)    [address]     [place], [place] [address]     [phone]      Intake starts 6pm daily Need valid ID, SSC, & Police report [employer] [address] [place], [place] [phone] Application Required  [employer] (men only)     [place].      [place], [place]       [place] (Pregnant women only) [address] [place], [place]  [facility]      [place] [address].      [place], [place] [address]     [phone]             [facility] [address] [place], [place] 90 day commitment/SA/Application process  [facility](men only)     [address]     [place], [place]       Check-in at [facility] [address] [place], [place] [address] [phone] Men/Women/Women and Children must be there by 7 pm  [place], [place]

## 2018-03-18 NOTE — ED Notes (Signed)
Patient denies any dizziness upon sitting and standing.  

## 2018-03-18 NOTE — ED Notes (Signed)
CRITICAL VALUE ALERT  Critical Value:  Ethanol 360  Date & Time Notied:  03/18/18, 1443  Provider Notified: Dr. Sabra Heck  Orders Received/Actions taken: no new orders

## 2018-03-18 NOTE — ED Triage Notes (Signed)
Pt stated she blacked out 30 mins ago and fell and hit her nose.

## 2018-03-18 NOTE — ED Triage Notes (Signed)
Pt states she has never passed out before. Is alert and oriented now. Blood noted to fa e from injury. NAD. Pt's neighbor brought her in.

## 2019-08-03 ENCOUNTER — Emergency Department (HOSPITAL_COMMUNITY)
Admission: EM | Admit: 2019-08-03 | Discharge: 2019-08-03 | Disposition: A | Discharge status: Home / Self Care | Payer: Medicaid Other | Attending: Emergency Medicine | Admitting: Emergency Medicine

## 2019-08-03 ENCOUNTER — Encounter (HOSPITAL_COMMUNITY): Payer: Self-pay | Admitting: Emergency Medicine

## 2019-08-03 ENCOUNTER — Other Ambulatory Visit: Payer: Self-pay

## 2019-08-03 DIAGNOSIS — E876 Hypokalemia: Secondary | ICD-10-CM | POA: Insufficient documentation

## 2019-08-03 DIAGNOSIS — R112 Nausea with vomiting, unspecified: Secondary | ICD-10-CM

## 2019-08-03 DIAGNOSIS — R531 Weakness: Secondary | ICD-10-CM

## 2019-08-03 DIAGNOSIS — I10 Essential (primary) hypertension: Secondary | ICD-10-CM | POA: Insufficient documentation

## 2019-08-03 LAB — CBC WITH DIFFERENTIAL/PLATELET
Abs Immature Granulocytes: 0.02 10*3/uL (ref 0.00–0.07)
Basophils Absolute: 0 10*3/uL (ref 0.0–0.1)
Basophils Relative: 1 %
Eosinophils Absolute: 0.1 10*3/uL (ref 0.0–0.5)
Eosinophils Relative: 2 %
HCT: 30 % — ABNORMAL LOW (ref 36.0–46.0)
Hemoglobin: 9.9 g/dL — ABNORMAL LOW (ref 12.0–15.0)
Immature Granulocytes: 0 %
Lymphocytes Relative: 26 %
Lymphs Abs: 1.5 10*3/uL (ref 0.7–4.0)
MCH: 31.5 pg (ref 26.0–34.0)
MCHC: 33 g/dL (ref 30.0–36.0)
MCV: 95.5 fL (ref 80.0–100.0)
Monocytes Absolute: 0.4 10*3/uL (ref 0.1–1.0)
Monocytes Relative: 8 %
Neutro Abs: 3.8 10*3/uL (ref 1.7–7.7)
Neutrophils Relative %: 63 %
Platelets: 322 10*3/uL (ref 150–400)
RBC: 3.14 MIL/uL — ABNORMAL LOW (ref 3.87–5.11)
RDW: 14.4 % (ref 11.5–15.5)
WBC: 5.8 10*3/uL (ref 4.0–10.5)
nRBC: 0 % (ref 0.0–0.2)

## 2019-08-03 LAB — URINALYSIS, ROUTINE W REFLEX MICROSCOPIC
Bilirubin Urine: NEGATIVE
Glucose, UA: NEGATIVE mg/dL
Ketones, ur: 20 mg/dL — AB
Leukocytes,Ua: NEGATIVE
Nitrite: NEGATIVE
Protein, ur: 30 mg/dL — AB
Specific Gravity, Urine: 1.01 (ref 1.005–1.030)
pH: 8 (ref 5.0–8.0)

## 2019-08-03 LAB — LIPASE, BLOOD: Lipase: 20 U/L (ref 11–51)

## 2019-08-03 LAB — COMPREHENSIVE METABOLIC PANEL
ALT: 21 U/L (ref 0–44)
AST: 38 U/L (ref 15–41)
Albumin: 4.3 g/dL (ref 3.5–5.0)
Alkaline Phosphatase: 43 U/L (ref 38–126)
BUN: 13 mg/dL (ref 8–23)
CO2: 23 mmol/L (ref 22–32)
Calcium: 10.1 mg/dL (ref 8.9–10.3)
Chloride: 87 mmol/L — ABNORMAL LOW (ref 98–111)
Creatinine, Ser: 0.63 mg/dL (ref 0.44–1.00)
GFR calc Af Amer: >60 mL/min (ref >60)
GFR calc non Af Amer: >60 mL/min (ref >60)
Glucose, Bld: 126 mg/dL — ABNORMAL HIGH (ref 70–99)
Potassium: 2.6 mmol/L — CL (ref 3.5–5.1)
Sodium: 131 mmol/L — ABNORMAL LOW (ref 135–145)
Total Bilirubin: 2.1 mg/dL — ABNORMAL HIGH (ref 0.3–1.2)
Total Protein: 8.9 g/dL — ABNORMAL HIGH (ref 6.5–8.1)

## 2019-08-03 LAB — TROPONIN I (HIGH SENSITIVITY): Troponin I (High Sensitivity): 12 ng/L (ref <18)

## 2019-08-03 LAB — POTASSIUM: Potassium: 3.4 mmol/L — ABNORMAL LOW (ref 3.5–5.1)

## 2019-08-03 MED ORDER — POTASSIUM CHLORIDE CRYS ER 20 MEQ PO TBCR
20.0000 meq | EXTENDED_RELEASE_TABLET | Freq: Once | ORAL | Status: AC
  Administered 2019-08-03: 20 meq via ORAL
  Filled 2019-08-03: qty 1

## 2019-08-03 MED ORDER — SODIUM CHLORIDE 0.9 % IV BOLUS
500.0000 mL | Freq: Once | INTRAVENOUS | Status: AC
  Administered 2019-08-03: 500 mL via INTRAVENOUS

## 2019-08-03 MED ORDER — POTASSIUM CHLORIDE 10 MEQ/100ML IV SOLN
10.0000 meq | Freq: Once | INTRAVENOUS | Status: AC
  Administered 2019-08-03: 10 meq via INTRAVENOUS
  Filled 2019-08-03: qty 100

## 2019-08-03 MED ORDER — ONDANSETRON 4 MG PO TBDP: 4.0000 mg | ORAL_TABLET | Freq: Three times a day (TID) | ORAL | 0 refills | Status: DC | PRN

## 2019-08-03 MED ORDER — FENTANYL CITRATE (PF) 100 MCG/2ML IJ SOLN
50.0000 ug | Freq: Once | INTRAMUSCULAR | Status: AC
  Administered 2019-08-03: 50 ug via INTRAVENOUS
  Filled 2019-08-03: qty 2

## 2019-08-03 MED ORDER — ONDANSETRON HCL 4 MG/2ML IJ SOLN
4.0000 mg | Freq: Once | INTRAMUSCULAR | Status: AC
  Administered 2019-08-03: 4 mg via INTRAVENOUS
  Filled 2019-08-03: qty 2

## 2019-08-03 NOTE — ED Notes (Signed)
Pt ambulated about 2 yards and needed to be put in a wheelchair.

## 2019-08-03 NOTE — ED Notes (Signed)
EKG leads and trunk cable replaced. EKG not working after several attempts.

## 2019-08-03 NOTE — ED Provider Notes (Signed)
[facility] EMERGENCY DEPARTMENT Provider Note   CSN: [id number] Arrival date & time: 08/03/19  0747     History   Chief Complaint Chief Complaint  Patient presents with  . Weakness  . Nausea  . Abdominal Pain    HPI Judy Davis is a 63 y.o. female with a history of HTN, anemia and etoh abuse presenting with generalized weakness, reporting has been unable to walk today along with nausea, vomiting x 1 yesterday and generalized abdominal pain.  She denies fevers chills, diarrhea, also no dysuria or hematuria. She last drank etoh "several days ago" stating has been afraid to drink and make her abd pain worse.  She has had no tx prior to arrival.  Reports some CP yesterday, none today.  Occasional cough, no sob. Last ate chicken and dumplings last night without emesis.  No intake today.  Ran out of bp medications 3 months ago as has been unable to get to [facility] for refills. Denies h/o dt's.     HPI  Past Medical History:  Diagnosis Date  . HTN (hypertension)     Patient Active Problem List   Diagnosis Date Noted  . Hematoma 09/29/2016  . Elevated AST (SGOT) 09/29/2016  . Hyperglycemia 09/29/2016  . Hypokalemia 09/29/2016  . Upper respiratory infection 07/06/2011  . UTI (lower urinary tract infection) 07/06/2011  . Nausea and vomiting 07/05/2011  . Hyponatremia 07/05/2011  . HTN (hypertension) 07/05/2011  . Anemia 07/05/2011    Past Surgical History:  Procedure Laterality Date  . ABDOMINAL HYSTERECTOMY  1970's     OB History   No obstetric history on file.      Home Medications    Prior to Admission medications   Medication Sig Start Date End Date Taking? Authorizing Provider  meloxicam (MOBIC) 7.5 MG tablet Take 2 tablets (15 mg total) by mouth daily. Patient not taking: Reported on 08/03/2019 03/18/18   Noemi Chapel, MD  ondansetron (ZOFRAN ODT) 4 MG disintegrating tablet Take 1 tablet (4 mg total) by mouth every 8 (eight) hours as needed for nausea or  vomiting. 08/03/19   Evalee Jefferson, PA-C    Family History Family History  Problem Relation Age of Onset  . Other Mother        Homicide    Social History Social History   Tobacco Use  . Smoking status: Never Smoker  . Smokeless tobacco: Never Used  Substance Use Topics  . Alcohol use: Yes    Comment: 2-3 beers a day when she can afford it  . Drug use: No     Allergies   Patient has no known allergies.   Review of Systems Review of Systems  Constitutional: Negative for chills and fever.  HENT: Negative for congestion and sore throat.   Eyes: Negative.   Respiratory: Positive for cough. Negative for chest tightness and shortness of breath.   Cardiovascular: Positive for chest pain.  Gastrointestinal: Positive for abdominal pain, nausea and vomiting.  Genitourinary: Negative.   Musculoskeletal: Negative for arthralgias, joint swelling and neck pain.  Skin: Negative.  Negative for rash and wound.  Neurological: Positive for weakness. Negative for dizziness, light-headedness, numbness and headaches.  Psychiatric/Behavioral: Negative.      Physical Exam Updated Vital Signs BP (!) 154/103   Pulse 81   Temp (!) 97.4 F (36.3 C) (Oral)   Resp 18   Ht 5\' 6"  (1.676 m)   Wt 54.4 kg   SpO2 100%   BMI 19.37 kg/m  Physical Exam Vitals signs and nursing note reviewed.  Constitutional:      General: She is not in acute distress.    Appearance: She is well-developed.     Comments: Cachectic   HENT:     Head: Normocephalic and atraumatic.     Mouth/Throat:     Mouth: Mucous membranes are pale and dry.  Eyes:     Conjunctiva/sclera: Conjunctivae normal.  Neck:     Musculoskeletal: Normal range of motion.  Cardiovascular:     Rate and Rhythm: Normal rate and regular rhythm.     Heart sounds: Normal heart sounds.  Pulmonary:     Effort: Pulmonary effort is normal.     Breath sounds: Normal breath sounds. No wheezing.  Abdominal:     General: Bowel sounds are  normal. There is no distension.     Palpations: Abdomen is soft. There is no fluid wave, mass or pulsatile mass.     Tenderness: There is generalized abdominal tenderness. There is no guarding or rebound.  Musculoskeletal: Normal range of motion.  Skin:    General: Skin is warm and dry.  Neurological:     General: No focal deficit present.     Mental Status: She is alert and oriented to person, place, and time.     Motor: Motor function is intact.     Deep Tendon Reflexes:     Reflex Scores:      Bicep reflexes are 2+ on the right side and 2+ on the left side.      Patellar reflexes are 2+ on the right side and 2+ on the left side.    Comments: 5/5 strength thigh and leg flex/ext.  Equal grip strength.        ED Treatments / Results  Labs (all labs ordered are listed, but only abnormal results are displayed) Labs Reviewed  COMPREHENSIVE METABOLIC PANEL - Abnormal; Notable for the following components:      Result Value   Sodium 131 (*)    Potassium 2.6 (*)    Chloride 87 (*)    Glucose, Bld 126 (*)    Total Protein 8.9 (*)    Total Bilirubin 2.1 (*)    All other components within normal limits  CBC WITH DIFFERENTIAL/PLATELET - Abnormal; Notable for the following components:   RBC 3.14 (*)    Hemoglobin 9.9 (*)    HCT 30.0 (*)    All other components within normal limits  URINALYSIS, ROUTINE W REFLEX MICROSCOPIC - Abnormal; Notable for the following components:   APPearance CLOUDY (*)    Hgb urine dipstick MODERATE (*)    Ketones, ur 20 (*)    Protein, ur 30 (*)    Bacteria, UA RARE (*)    All other components within normal limits  POTASSIUM - Abnormal; Notable for the following components:   Potassium 3.4 (*)    All other components within normal limits  LIPASE, BLOOD  TROPONIN I (HIGH SENSITIVITY)    EKG EKG Interpretation  Date/Time:  Wednesday August 03 2019 09:02:21 EST Ventricular Rate:  85 PR Interval:  140 QRS Duration: 84 QT Interval:  418 QTC  Calculation: 497 R Axis:   76 Text Interpretation: Normal sinus rhythm ST & T wave abnormality, consider inferior ischemia Abnormal ECG Confirmed by Nat Christen [phone]) on 08/03/2019 9:27:04 AM   Radiology No results found.  Procedures Procedures (including critical care time)  Medications Ordered in ED Medications  ondansetron (ZOFRAN) injection 4 mg (4 mg Intravenous  Given 08/03/19 0916)  fentaNYL (SUBLIMAZE) injection 50 mcg (50 mcg Intravenous Given 08/03/19 0919)  sodium chloride 0.9 % bolus 500 mL (0 mLs Intravenous Stopped 08/03/19 1048)  potassium chloride 10 mEq in 100 mL IVPB (0 mEq Intravenous Stopped 08/03/19 1057)  potassium chloride SA (KLOR-CON) CR tablet 20 mEq (20 mEq Oral Given 08/03/19 0958)  potassium chloride 10 mEq in 100 mL IVPB (0 mEq Intravenous Stopped 08/03/19 1209)     Initial Impression / Assessment and Plan / ED Course  I have reviewed the triage vital signs and the nursing notes.  Pertinent labs & imaging results that were available during my care of the patient were reviewed by me and considered in my medical decision making (see chart for details).        Labs reviewed, pt with significant hypokalemia.  IV and po replacement.  She was ambulated in the ed, walked about 2 yards then needed a wheelchair with c/o weakness. No sob.  Husband now at bedside. His biggest concern is she has had poor appetite x 2 days.  She was given zofran here, no n/v. She was given a meal tray and ate salad, applesauce, bread. She believes she is just weak because of the weather, states can't go anywhere, can't work in a garden. She denies cp, sob, denies headache, lightheadedness, simple feels weak in the legs,  No c/o back pain/radicular pain.   Will repeat potassium level now that this was replaced, will re-ambulate once she has had a meal. No clear indication for need of admission today.   Pt ate a meal, her potassium level was rechecked and is now normal range.  She  ambulated in the dept better, felt improved. No distress at time of dc. ABd soft, non tender at re-exam.  Pt dispo to home with close f/u pcp. Return precautions discussed.   Final Clinical Impressions(s) / ED Diagnoses   Final diagnoses:  Weakness  Hypokalemia    ED Discharge Orders         Ordered    ondansetron (ZOFRAN ODT) 4 MG disintegrating tablet  Every 8 hours PRN     08/03/19 1435           Evalee Jefferson, PA-C 08/03/19 1441    Nat Christen, MD 08/04/19 [phone]

## 2019-08-03 NOTE — ED Notes (Signed)
Date and time results received: 08/03/19 0923 (use smartphrase ".now" to insert current time)  Test: Potassium Critical Value: 2.6  Name of Provider Notified: Idol  Orders Received? Or Actions Taken?: Orders Received - See Orders for details

## 2019-08-03 NOTE — Discharge Instructions (Addendum)
Your potassium level was low today which usually occurs form nausea and vomiting and can make you weak.  This has been replaced using IV replacement.  You may take the zofran prescribed if your nausea or vomiting returns.  Make sure you are drinking plenty of fluids to avoid dehydration.  Get rechecked if your symptoms persist or worsen.   Your labs and exam are otherwise reassuring today.

## 2019-08-03 NOTE — ED Notes (Signed)
Pt was informed that we need a urine sample. 

## 2019-08-03 NOTE — ED Triage Notes (Signed)
Patient complains of weakness x 2 days with nausea and vomiting. Denies fever, diarrhea.

## 2019-08-07 ENCOUNTER — Emergency Department (HOSPITAL_COMMUNITY): Payer: Medicaid Other

## 2019-08-07 ENCOUNTER — Inpatient Hospital Stay (HOSPITAL_COMMUNITY)
Admission: EM | Admit: 2019-08-07 | Discharge: 2019-08-10 | DRG: 392 | Disposition: A | Discharge status: Home Health | Payer: Medicaid Other | Attending: Internal Medicine | Admitting: Internal Medicine

## 2019-08-07 ENCOUNTER — Other Ambulatory Visit: Payer: Self-pay

## 2019-08-07 ENCOUNTER — Encounter (HOSPITAL_COMMUNITY): Payer: Self-pay | Admitting: Emergency Medicine

## 2019-08-07 DIAGNOSIS — E869 Volume depletion, unspecified: Secondary | ICD-10-CM | POA: Diagnosis present

## 2019-08-07 DIAGNOSIS — R64 Cachexia: Secondary | ICD-10-CM | POA: Diagnosis present

## 2019-08-07 DIAGNOSIS — E876 Hypokalemia: Secondary | ICD-10-CM | POA: Diagnosis present

## 2019-08-07 DIAGNOSIS — R824 Acetonuria: Secondary | ICD-10-CM | POA: Diagnosis present

## 2019-08-07 DIAGNOSIS — K2920 Alcoholic gastritis without bleeding: Principal | ICD-10-CM | POA: Diagnosis present

## 2019-08-07 DIAGNOSIS — E538 Deficiency of other specified B group vitamins: Secondary | ICD-10-CM | POA: Diagnosis present

## 2019-08-07 DIAGNOSIS — F1010 Alcohol abuse, uncomplicated: Secondary | ICD-10-CM | POA: Diagnosis present

## 2019-08-07 DIAGNOSIS — D649 Anemia, unspecified: Secondary | ICD-10-CM | POA: Diagnosis present

## 2019-08-07 DIAGNOSIS — K709 Alcoholic liver disease, unspecified: Secondary | ICD-10-CM | POA: Diagnosis present

## 2019-08-07 DIAGNOSIS — Z20828 Contact with and (suspected) exposure to other viral communicable diseases: Secondary | ICD-10-CM | POA: Diagnosis present

## 2019-08-07 DIAGNOSIS — R1310 Dysphagia, unspecified: Secondary | ICD-10-CM | POA: Diagnosis present

## 2019-08-07 DIAGNOSIS — R2681 Unsteadiness on feet: Secondary | ICD-10-CM

## 2019-08-07 DIAGNOSIS — I10 Essential (primary) hypertension: Secondary | ICD-10-CM | POA: Diagnosis present

## 2019-08-07 DIAGNOSIS — R1110 Vomiting, unspecified: Secondary | ICD-10-CM | POA: Diagnosis present

## 2019-08-07 DIAGNOSIS — R112 Nausea with vomiting, unspecified: Secondary | ICD-10-CM | POA: Diagnosis present

## 2019-08-07 DIAGNOSIS — N3001 Acute cystitis with hematuria: Secondary | ICD-10-CM | POA: Diagnosis present

## 2019-08-07 DIAGNOSIS — E872 Acidosis: Secondary | ICD-10-CM | POA: Diagnosis present

## 2019-08-07 DIAGNOSIS — Z681 Body mass index (BMI) 19 or less, adult: Secondary | ICD-10-CM

## 2019-08-07 DIAGNOSIS — Z90710 Acquired absence of both cervix and uterus: Secondary | ICD-10-CM

## 2019-08-07 DIAGNOSIS — R531 Weakness: Secondary | ICD-10-CM

## 2019-08-07 DIAGNOSIS — E871 Hypo-osmolality and hyponatremia: Secondary | ICD-10-CM | POA: Diagnosis present

## 2019-08-07 DIAGNOSIS — K2100 Gastro-esophageal reflux disease with esophagitis, without bleeding: Secondary | ICD-10-CM | POA: Diagnosis present

## 2019-08-07 HISTORY — DX: Alcohol abuse, uncomplicated: F10.10

## 2019-08-07 LAB — URINALYSIS, ROUTINE W REFLEX MICROSCOPIC
Bilirubin Urine: NEGATIVE
Glucose, UA: NEGATIVE mg/dL
Ketones, ur: 20 mg/dL — AB
Leukocytes,Ua: NEGATIVE
Nitrite: NEGATIVE
Protein, ur: 100 mg/dL — AB
Specific Gravity, Urine: 1.013 (ref 1.005–1.030)
pH: 9 — ABNORMAL HIGH (ref 5.0–8.0)

## 2019-08-07 LAB — CBC WITH DIFFERENTIAL/PLATELET
Abs Immature Granulocytes: 0.02 10*3/uL (ref 0.00–0.07)
Basophils Absolute: 0 10*3/uL (ref 0.0–0.1)
Basophils Relative: 0 %
Eosinophils Absolute: 0 10*3/uL (ref 0.0–0.5)
Eosinophils Relative: 1 %
HCT: 29.9 % — ABNORMAL LOW (ref 36.0–46.0)
Hemoglobin: 9.8 g/dL — ABNORMAL LOW (ref 12.0–15.0)
Immature Granulocytes: 0 %
Lymphocytes Relative: 28 %
Lymphs Abs: 1.5 10*3/uL (ref 0.7–4.0)
MCH: 31 pg (ref 26.0–34.0)
MCHC: 32.8 g/dL (ref 30.0–36.0)
MCV: 94.6 fL (ref 80.0–100.0)
Monocytes Absolute: 0.5 10*3/uL (ref 0.1–1.0)
Monocytes Relative: 10 %
Neutro Abs: 3.4 10*3/uL (ref 1.7–7.7)
Neutrophils Relative %: 61 %
Platelets: 347 10*3/uL (ref 150–400)
RBC: 3.16 MIL/uL — ABNORMAL LOW (ref 3.87–5.11)
RDW: 14.6 % (ref 11.5–15.5)
WBC: 5.6 10*3/uL (ref 4.0–10.5)
nRBC: 0 % (ref 0.0–0.2)

## 2019-08-07 LAB — COMPREHENSIVE METABOLIC PANEL
ALT: 22 U/L (ref 0–44)
AST: 39 U/L (ref 15–41)
Albumin: 4.3 g/dL (ref 3.5–5.0)
Alkaline Phosphatase: 45 U/L (ref 38–126)
Anion gap: 19 — ABNORMAL HIGH (ref 5–15)
BUN: 13 mg/dL (ref 8–23)
CO2: 21 mmol/L — ABNORMAL LOW (ref 22–32)
Calcium: 10.1 mg/dL (ref 8.9–10.3)
Chloride: 92 mmol/L — ABNORMAL LOW (ref 98–111)
Creatinine, Ser: 0.71 mg/dL (ref 0.44–1.00)
GFR calc Af Amer: >60 mL/min (ref >60)
GFR calc non Af Amer: >60 mL/min (ref >60)
Glucose, Bld: 113 mg/dL — ABNORMAL HIGH (ref 70–99)
Potassium: 2.8 mmol/L — ABNORMAL LOW (ref 3.5–5.1)
Sodium: 132 mmol/L — ABNORMAL LOW (ref 135–145)
Total Bilirubin: 2.4 mg/dL — ABNORMAL HIGH (ref 0.3–1.2)
Total Protein: 8.9 g/dL — ABNORMAL HIGH (ref 6.5–8.1)

## 2019-08-07 LAB — TROPONIN I (HIGH SENSITIVITY): Troponin I (High Sensitivity): 16 ng/L (ref <18)

## 2019-08-07 LAB — LIPASE, BLOOD: Lipase: 19 U/L (ref 11–51)

## 2019-08-07 LAB — MAGNESIUM: Magnesium: 1.6 mg/dL — ABNORMAL LOW (ref 1.7–2.4)

## 2019-08-07 LAB — CBG MONITORING, ED: Glucose-Capillary: 116 mg/dL — ABNORMAL HIGH (ref 70–99)

## 2019-08-07 MED ORDER — SODIUM CHLORIDE 0.9 % IV SOLN: 1.0000 g | INTRAVENOUS | Status: DC

## 2019-08-07 MED ORDER — ACETAMINOPHEN 650 MG RE SUPP: 650.0000 mg | Freq: Four times a day (QID) | RECTAL | Status: DC | PRN

## 2019-08-07 MED ORDER — POTASSIUM CHLORIDE CRYS ER 20 MEQ PO TBCR
40.0000 meq | EXTENDED_RELEASE_TABLET | Freq: Once | ORAL | Status: AC
  Administered 2019-08-07: 40 meq via ORAL
  Filled 2019-08-07: qty 2

## 2019-08-07 MED ORDER — SODIUM CHLORIDE 0.9% FLUSH
3.0000 mL | Freq: Once | INTRAVENOUS | Status: AC
  Administered 2019-08-07: 3 mL via INTRAVENOUS

## 2019-08-07 MED ORDER — ONDANSETRON HCL 4 MG/2ML IJ SOLN: 4.0000 mg | Freq: Once | INTRAMUSCULAR | Status: DC

## 2019-08-07 MED ORDER — VITAMIN B-1 100 MG PO TABS
100.0000 mg | ORAL_TABLET | Freq: Every day | ORAL | Status: DC
  Administered 2019-08-08: 100 mg via ORAL
  Filled 2019-08-07 (×2): qty 1

## 2019-08-07 MED ORDER — LORAZEPAM 1 MG PO TABS: 1.0000 mg | ORAL_TABLET | ORAL | Status: DC | PRN

## 2019-08-07 MED ORDER — ADULT MULTIVITAMIN W/MINERALS CH
1.0000 | ORAL_TABLET | Freq: Every day | ORAL | Status: DC
  Administered 2019-08-08: 1 via ORAL
  Filled 2019-08-07 (×2): qty 1

## 2019-08-07 MED ORDER — KCL IN DEXTROSE-NACL 20-5-0.9 MEQ/L-%-% IV SOLN
INTRAVENOUS | Status: AC
  Administered 2019-08-07 – 2019-08-08 (×3): via INTRAVENOUS
  Filled 2019-08-07 (×2): qty 1000

## 2019-08-07 MED ORDER — THIAMINE HCL 100 MG/ML IJ SOLN
100.0000 mg | Freq: Every day | INTRAMUSCULAR | Status: DC
  Administered 2019-08-07 – 2019-08-10 (×3): 100 mg via INTRAVENOUS
  Filled 2019-08-07 (×3): qty 2

## 2019-08-07 MED ORDER — METOCLOPRAMIDE HCL 5 MG/ML IJ SOLN
10.0000 mg | Freq: Once | INTRAMUSCULAR | Status: AC
  Administered 2019-08-07: 10 mg via INTRAVENOUS
  Filled 2019-08-07: qty 2

## 2019-08-07 MED ORDER — LORAZEPAM 2 MG/ML IJ SOLN: 1.0000 mg | INTRAMUSCULAR | Status: DC | PRN

## 2019-08-07 MED ORDER — POLYETHYLENE GLYCOL 3350 17 G PO PACK: 17.0000 g | PACK | Freq: Every day | ORAL | Status: DC | PRN

## 2019-08-07 MED ORDER — POTASSIUM CHLORIDE 10 MEQ/100ML IV SOLN
10.0000 meq | Freq: Once | INTRAVENOUS | Status: DC
  Filled 2019-08-07: qty 100

## 2019-08-07 MED ORDER — ONDANSETRON HCL 4 MG/2ML IJ SOLN: 4.0000 mg | Freq: Four times a day (QID) | INTRAMUSCULAR | Status: DC | PRN

## 2019-08-07 MED ORDER — MAGNESIUM SULFATE 2 GM/50ML IV SOLN
2.0000 g | Freq: Once | INTRAVENOUS | Status: AC
  Administered 2019-08-07: 2 g via INTRAVENOUS
  Filled 2019-08-07: qty 50

## 2019-08-07 MED ORDER — LABETALOL HCL 5 MG/ML IV SOLN: 10.0000 mg | INTRAVENOUS | Status: DC | PRN

## 2019-08-07 MED ORDER — ACETAMINOPHEN 325 MG PO TABS: 650.0000 mg | ORAL_TABLET | Freq: Four times a day (QID) | ORAL | Status: DC | PRN

## 2019-08-07 MED ORDER — SODIUM CHLORIDE 0.9 % IV BOLUS
500.0000 mL | Freq: Once | INTRAVENOUS | Status: AC
  Administered 2019-08-07: 500 mL via INTRAVENOUS

## 2019-08-07 MED ORDER — SODIUM CHLORIDE 0.9 % IV SOLN
1.0000 g | Freq: Once | INTRAVENOUS | Status: AC
  Administered 2019-08-07: 1 g via INTRAVENOUS
  Filled 2019-08-07: qty 10

## 2019-08-07 MED ORDER — ONDANSETRON HCL 4 MG/2ML IJ SOLN
4.0000 mg | Freq: Once | INTRAMUSCULAR | Status: AC
  Administered 2019-08-07: 4 mg via INTRAVENOUS
  Filled 2019-08-07: qty 2

## 2019-08-07 MED ORDER — FOLIC ACID 1 MG PO TABS
1.0000 mg | ORAL_TABLET | Freq: Every day | ORAL | Status: DC
  Administered 2019-08-08: 1 mg via ORAL
  Filled 2019-08-07 (×2): qty 1

## 2019-08-07 MED ORDER — ONDANSETRON HCL 4 MG PO TABS: 4.0000 mg | ORAL_TABLET | Freq: Four times a day (QID) | ORAL | Status: DC | PRN

## 2019-08-07 NOTE — ED Notes (Signed)
Report to Holly, RN

## 2019-08-07 NOTE — ED Provider Notes (Signed)
Received patient as a hand-off from [employer], PA-C. Patient is a 63 year old female with PMH significant for EtOH abuse, HTN, and anemia who presents for weakness as well as vomiting and loose stools with associated diminished appetite.  Patient was recently evaluated in the ED for similar symptoms and was found to have potassium of 2.6.  Her potassium was replenished in the ED and she was ultimately discharged after her symptoms had improved.  On my personal examination, patient was a bit withdrawn.  When asked if patient had been nauseated or vomiting, she denied those symptoms, but then stated that she has not been able to keep any of her food down. Difficult to assess.    Physical Exam  BP (!) 167/117   Pulse 87   Temp 98.7 F (37.1 C) (Oral)   Resp 13   Ht 5' (1.524 m)   Wt 50 kg   SpO2 100%   BMI 21.53 kg/m   Physical Exam Vitals signs and nursing note reviewed. Exam conducted with a chaperone present.  Constitutional:      Appearance: Normal appearance.     Comments: Thin.  HENT:     Head: Normocephalic and atraumatic.  Eyes:     Conjunctiva/sclera: Conjunctivae normal.     Pupils: Pupils are equal, round, and reactive to light.  Neck:     Musculoskeletal: Normal range of motion and neck supple. No neck rigidity.  Cardiovascular:     Rate and Rhythm: Normal rate and regular rhythm.     Pulses: Normal pulses.     Heart sounds: Normal heart sounds.  Pulmonary:     Effort: Pulmonary effort is normal.  Abdominal:     Comments: Mild TTP diffusely.  No significant focal tenderness.  No guarding.  No overlying skin changes.  Nondistended.  No significant fluid retention.  Skin:    General: Skin is dry.  Neurological:     Mental Status: She is alert.     GCS: GCS eye subscore is 4. GCS verbal subscore is 5. GCS motor subscore is 6.  Psychiatric:        Mood and Affect: Mood normal.        Thought Content: Thought content normal.     Comments: Mildly withdrawn.      ED  Course/Procedures   Clinical Course as of Aug 06 1840  Nancy Fetter Aug 07, 2019  1658 Replenishing with 40 mEq K-Dur  Potassium(!): 2.8 [GG]    Clinical Course User Index [GG] Corena Herter, PA-C    Procedures CLINICAL DATA: Acute abdominal pain. Vomiting and diarrhea.    EXAM:  CT ABDOMEN AND PELVIS WITHOUT CONTRAST    TECHNIQUE:  Multidetector CT imaging of the abdomen and pelvis was performed  following the standard protocol without IV contrast.    COMPARISON: CT dated September 29, 2016.    FINDINGS:  Lower chest: The lung bases are clear. The heart size is normal.    Hepatobiliary: There is a 3.4 cm cyst in the right hepatic lobe.  Normal gallbladder.There is no biliary ductal dilation.    Pancreas: There are multiple calcifications throughout the pancreas.  These can be seen with chronic pancreatitis.    Spleen: No splenic laceration or hematoma.    Adrenals/Urinary Tract:    --Adrenal glands: No adrenal hemorrhage.    --Right kidney/ureter: The right kidney is malrotated with no  evidence for right-sided hydronephrosis or radiopaque obstructing  kidney stone    --Left kidney/ureter: The left kidney  is somewhat malrotated without  evidence for hydronephrosis or radiopaque stone.    --Urinary bladder: Unremarkable.    Stomach/Bowel:    --Stomach/Duodenum: The stomach is distended. An air-fluid level is  noted.    --Small bowel: No dilatation or inflammation.    --Colon: No focal abnormality.    --Appendix: Not visualized. No right lower quadrant inflammation or  free fluid.    Vascular/Lymphatic: Atherosclerotic calcification is present within  the non-aneurysmal abdominal aorta, without hemodynamically  significant stenosis.    --No retroperitoneal lymphadenopathy.    --No mesenteric lymphadenopathy.    --No pelvic or inguinal lymphadenopathy.    Reproductive: Status post hysterectomy. No adnexal mass.    Other: No ascites  or free air. The abdominal wall is normal.    Musculoskeletal. No acute displaced fractures.    IMPRESSION:  1. The stomach is distended with air-fluid level. Findings may be  secondary to gastroparesis or outlet obstruction.  2. Multiple calcifications throughout the pancreas. These can be  seen with chronic pancreatitis.    Aortic Atherosclerosis (ICD10-I70.0).      Electronically Signed  By: Constance Holster M.D.  On: 08/07/2019 18:05     MDM   Rocephin given for UTI and 40 mEq K-Dur to replete potassium level. CT abdomen and pelvis has been ordered.  Patient appears cachectic and has 20 lb weight loss.  She has been given Zofran IV which has helped her nausea and vomiting symptoms considerably.  CMP demonstrates a metabolic acidosis, likely attributable to her reported vomiting. Idol PA-C would like patient admitted for UTI and associated weakness and mild metabolic acidosis with elevated anion gap.   On my examination, patient was anxious to go home.  Per Dr. Sedonia Small, amended CT imaging to obtain without contrast in order to expedite process of admission.  Will reassess after CT abdomen and pelvis is obtained before consulting hospitalist for admission.  CT demonstrates stomach is distended distended with air-fluid level, secondary to outlet obstruction versus gastroparesis.  Will provide metoclopramide and consult with hospitalist due to her weakness secondary to UTI, hypokalemia, and vomiting.  6:57 PM Spoke with [facility] who will admit patient.   Corena Herter, PA-C 08/07/19 1919    Maudie Flakes, MD 08/08/19 [phone]

## 2019-08-07 NOTE — ED Notes (Signed)
JI in to assess 

## 2019-08-07 NOTE — ED Notes (Signed)
No vomiting noted while in ED thus far

## 2019-08-07 NOTE — ED Notes (Signed)
Pt continues to sip contrast  Encouraged to drink

## 2019-08-07 NOTE — ED Triage Notes (Signed)
Pt family states not eating for several weeks and cannot keep anything down   Here 11/8 for same

## 2019-08-07 NOTE — H&P (Signed)
History and Physical    CATHYRN PINERA [id number] DOB: 08-Jun-1956 DOA: 08/07/2019  [facility]P: [facility]   Patient coming from: Home  I have personally briefly reviewed patient's old medical records in [place]  Chief Complaint: Vomiting, weakness  HPI: Judy Davis is a 63 y.o. female with medical history significant for hypertension and alcohol abuse.  Patient was brought to the ED with complaints of vomiting and generalized weakness over the past several days.  History is obtained from chart review.  On my evaluation, although patient is awake and alert, except from telling me she would like to go home, she keeps nodding her head yes to all my questions.  Patient was in the ED 4 days 11/18 with same complaints.  Blood work revealed hypokalemia, which was repleted in the ED, patient was able to eat and ambulate in the ED and was sent home.  With return precautions.  Since discharge symptoms of vomiting and weakness have persisted, with abdominal pain. Attempted contacting patient spouse Legrand Como but got voicemail.  ED Course: Systolic blood pressure 0000000 to 180s, potassium 2.8, lipase within normal limits 19.  WBC 5.6.  Abdominal CT without contrast-distended stomach with air-fluid levels, findings may be secondary to gastroparesis or outlet obstruction.  UA showed many bacteria.  IV ceftriaxone, p.o. KCL, 500 mill bolus normal saline given.  Hospitalist to admit.  Review of Systems: Unable to fully assess, patient not answering questions.  Past Medical History:  Diagnosis Date  . ETOH abuse   . HTN (hypertension)     Past Surgical History:  Procedure Laterality Date  . ABDOMINAL HYSTERECTOMY  1970's     reports that she has never smoked. She has never used smokeless tobacco. She reports current alcohol use. She reports that she does not use drugs.  No Known Allergies  Family History  Problem Relation Age of Onset  . Other Mother    Homicide    Prior to Admission medications   Medication Sig Start Date End Date Taking? Authorizing Provider  ondansetron (ZOFRAN ODT) 4 MG disintegrating tablet Take 1 tablet (4 mg total) by mouth every 8 (eight) hours as needed for nausea or vomiting. 08/03/19  Yes Idol, Almyra Free, PA-C  meloxicam (MOBIC) 7.5 MG tablet Take 2 tablets (15 mg total) by mouth daily. Patient not taking: Reported on 08/03/2019 03/18/18   Noemi Chapel, MD    Physical Exam: Vitals:   08/07/19 1845 08/07/19 1906 08/07/19 1930 08/07/19 2000  BP:  (!) 176/100 (!) 148/90 (!) 156/89  Pulse: 83 96 96 83  Resp:      Temp:      TempSrc:      SpO2: 100% 100% 100% 100%  Weight:      Height:        Constitutional: NAD, calm, comfortable Vitals:   08/07/19 1845 08/07/19 1906 08/07/19 1930 08/07/19 2000  BP:  (!) 176/100 (!) 148/90 (!) 156/89  Pulse: 83 96 96 83  Resp:      Temp:      TempSrc:      SpO2: 100% 100% 100% 100%  Weight:      Height:       Eyes: PERRL, lids and conjunctivae normal ENMT: Mucous membranes are moist. Posterior pharynx clear of any exudate or lesions.  Neck: normal, supple, no masses, no thyromegaly Respiratory: clear to auscultation bilaterally, no wheezing, no crackles. Normal respiratory effort. No accessory muscle use.  Cardiovascular: Regular rate and rhythm, no murmurs /  rubs / gallops. No extremity edema. 2+ pedal pulses. No carotid bruits.  Abdomen: Moderate epigastric tenderness, abdomen flat-not distended, no hepatosplenomegaly. Bowel sounds positive.  Musculoskeletal: no clubbing / cyanosis. No joint deformity upper and lower extremities. Good ROM, no contractures. Normal muscle tone.  Skin: no rashes, lesions, ulcers. No induration Neurologic: CN 2-12 grossly intact. Strength 4+ /5 in all 4.  Psychiatric: Normal judgment and insight. Alert and oriented x 3. Normal mood.   Labs on Admission: I have personally reviewed following labs and imaging studies  CBC: Recent Labs   Lab 08/03/19 0853 08/07/19 1421  WBC 5.8 5.6  NEUTROABS 3.8 3.4  HGB 9.9* 9.8*  HCT 30.0* 29.9*  MCV 95.5 94.6  PLT 322 [id number]   Basic Metabolic Panel: Recent Labs  Lab 08/03/19 0853 08/03/19 1309 08/07/19 1421  NA 131*  --  132*  K 2.6* 3.4* 2.8*  CL 87*  --  92*  CO2 23  --  21*  GLUCOSE 126*  --  113*  BUN 13  --  13  CREATININE 0.63  --  0.71  CALCIUM 10.1  --  10.1  MG  --   --  1.6*   Liver Function Tests: Recent Labs  Lab 08/03/19 0853 08/07/19 1421  AST 38 39  ALT 21 22  ALKPHOS 43 45  BILITOT 2.1* 2.4*  PROT 8.9* 8.9*  ALBUMIN 4.3 4.3   Recent Labs  Lab 08/03/19 0853 08/07/19 1421  LIPASE 20 19   CBG: Recent Labs  Lab 08/07/19 1422  GLUCAP 116*   Urine analysis:    Component Value Date/Time   COLORURINE AMBER (A) 08/07/2019 1347   APPEARANCEUR CLOUDY (A) 08/07/2019 1347   LABSPEC 1.013 08/07/2019 1347   PHURINE 9.0 (H) 08/07/2019 1347   GLUCOSEU NEGATIVE 08/07/2019 1347   HGBUR MODERATE (A) 08/07/2019 1347   BILIRUBINUR NEGATIVE 08/07/2019 1347   KETONESUR 20 (A) 08/07/2019 1347   PROTEINUR 100 (A) 08/07/2019 1347   UROBILINOGEN 0.2 05/10/2015 1000   NITRITE NEGATIVE 08/07/2019 1347   LEUKOCYTESUR NEGATIVE 08/07/2019 1347    Radiological Exams on Admission: Ct Abdomen Pelvis Wo Contrast  Result Date: 08/07/2019 CLINICAL DATA:  Acute abdominal pain. Vomiting and diarrhea. EXAM: CT ABDOMEN AND PELVIS WITHOUT CONTRAST TECHNIQUE: Multidetector CT imaging of the abdomen and pelvis was performed following the standard protocol without IV contrast. COMPARISON:  CT dated September 29, 2016. FINDINGS: Lower chest: The lung bases are clear. The heart size is normal. Hepatobiliary: There is a 3.4 cm cyst in the right hepatic lobe. Normal gallbladder.There is no biliary ductal dilation. Pancreas: There are multiple calcifications throughout the pancreas. These can be seen with chronic pancreatitis. Spleen: No splenic laceration or hematoma.  Adrenals/Urinary Tract: --Adrenal glands: No adrenal hemorrhage. --Right kidney/ureter: The right kidney is malrotated with no evidence for right-sided hydronephrosis or radiopaque obstructing kidney stone --Left kidney/ureter: The left kidney is somewhat malrotated without evidence for hydronephrosis or radiopaque stone. --Urinary bladder: Unremarkable. Stomach/Bowel: --Stomach/Duodenum: The stomach is distended. An air-fluid level is noted. --Small bowel: No dilatation or inflammation. --Colon: No focal abnormality. --Appendix: Not visualized. No right lower quadrant inflammation or free fluid. Vascular/Lymphatic: Atherosclerotic calcification is present within the non-aneurysmal abdominal aorta, without hemodynamically significant stenosis. --No retroperitoneal lymphadenopathy. --No mesenteric lymphadenopathy. --No pelvic or inguinal lymphadenopathy. Reproductive: Status post hysterectomy. No adnexal mass. Other: No ascites or free air. The abdominal wall is normal. Musculoskeletal. No acute displaced fractures. IMPRESSION: 1. The stomach is distended with air-fluid level. Findings may  be secondary to gastroparesis or outlet obstruction. 2. Multiple calcifications throughout the pancreas. These can be seen with chronic pancreatitis. Aortic Atherosclerosis (ICD10-I70.0). Electronically Signed   By: Constance Holster M.D.   On: 08/07/2019 18:05    EKG: Independently reviewed.  Sinus tachycardia rate 108, QTc 472.  No significant change from prior.  Assessment/Plan Active Problems:   Intractable vomiting    Intractable vomiting- with abdominal pain.  No diarrhea.  Abdominal CT suggesting gastroparesis versus gastric outlet obstruction. - GI consult placed - N.p.o. - PRN Zofran - 500 bolus given, continue D5 N/s + 20 KCL @ 100cc/hr x 1 day  Hypokalemia, hypomagnesemia-potassium 2.8, Mag 1.6. -Replete electrolytes - BMP a.m  Possible UTI- generalized less likely from poor p.o. intake, vomiting.   Unable to confirm or refute UTI symptoms.  WBC 5.6.  UA many bacteria. -Follow-up urine cultures -Continue IV ceftriaxone 1 g daily  Hypertension- elevated. Per notes she ran out of her anti-HTNs a few months ago.  No antihypertensives on medication list, unable to confirm with patient and spouse. -IV labetalol as needed for now while n.p.o.  History of alcohol abuse-Per notes drink 1 can of beer yesterday. -CIWA -Multivitamins, folate, thiamine when able   DVT prophylaxis: SCDS for now pending GI eval Code Status: Full code Family Communication: Got Voicemail on attempting to call patient spouse Legrand Como at [phone]. Disposition Plan: Per rounding team Consults called: GI Admission status: Obs, Tele   Bethena Roys MD [facility]  08/07/2019, 8:30 PM

## 2019-08-07 NOTE — ED Notes (Signed)
Here several days ago  Presents with same complaint  Weak Skin tenting Unresponsive to nurse, but speaks with PA  Lab in to attempt draw

## 2019-08-07 NOTE — ED Notes (Signed)
Resting with eyes closed with even unlabored respirations

## 2019-08-07 NOTE — ED Notes (Signed)
To CT

## 2019-08-07 NOTE — ED Notes (Signed)
To room to encourage pt to drink contrast  She was found crosswise on bed trying to pull her pants up and had urinated in the floor  She reports "my name is Judy Davis and they told me to get dressed and go home I've been here an hour"  Pt hitherto before has not spoken she is assisted bak into the bed, linenes changed and pinching both this Probation officer and Magda Paganini, RN, CN  Pt has removed her IV- Attempt to replace x 4 (2 by leslie, 3 by Tiffany O) with placement of 24 on 4th attempt to her L forearm by TO, RN  Pt moved to rm 18, sitter provided due to pt trying to leave

## 2019-08-07 NOTE — ED Notes (Signed)
Call for report  Deatra Canter, RN will call back

## 2019-08-07 NOTE — ED Notes (Signed)
Judy Davis  [phone]

## 2019-08-07 NOTE — ED Notes (Signed)
Pt spouse Hoover Browns has called several times  He is reassured by several staff members

## 2019-08-07 NOTE — ED Notes (Signed)
Pt. CBG was 116.

## 2019-08-07 NOTE — ED Provider Notes (Addendum)
[facility] EMERGENCY DEPARTMENT Provider Note   CSN: [id number] Arrival date & time: 08/07/19  1335     History   Chief Complaint Chief Complaint  Patient presents with  . Weakness    HPI Judy Davis is a 63 y.o. female with a history of HTN, anemia, and etoh overuse (last drank one can of beer yesterday) presenting with generalized weakness, nausea with emesis and intermittent generalized abdominal pain.  She was seen her for similar symptoms 4 days ago and her potassium was found to be low at 2.6. This was replaced per IV and oral supplementation and her repeat potassium was improved.  She was prescribed zofran for her nausea which sometimes was helpful, but seemed to make her nausea worse yesterday.  She also reports episodes of diarrhea but has had none today.     The history is provided by the patient.  Weakness Associated symptoms: abdominal pain, diarrhea, nausea and vomiting   Associated symptoms: no arthralgias, no chest pain, no dizziness, no dysuria, no fever, no headaches and no shortness of breath     Past Medical History:  Diagnosis Date  . ETOH abuse   . HTN (hypertension)     Patient Active Problem List   Diagnosis Date Noted  . Gait instability 08/10/2019  . Alcohol abuse 08/08/2019  . Intractable nausea and vomiting 08/08/2019  . Intractable vomiting 08/07/2019  . Hematoma 09/29/2016  . Elevated AST (SGOT) 09/29/2016  . Hyperglycemia 09/29/2016  . Hypokalemia 09/29/2016  . Upper respiratory infection 07/06/2011  . UTI (lower urinary tract infection) 07/06/2011  . Nausea and vomiting 07/05/2011  . Hyponatremia 07/05/2011  . HTN (hypertension) 07/05/2011  . Anemia 07/05/2011    Past Surgical History:  Procedure Laterality Date  . ABDOMINAL HYSTERECTOMY  1970's     OB History   No obstetric history on file.      Home Medications    Prior to Admission medications   Medication Sig Start Date End Date Taking? Authorizing Provider   ondansetron (ZOFRAN ODT) 4 MG disintegrating tablet Take 1 tablet (4 mg total) by mouth every 8 (eight) hours as needed for nausea or vomiting. 08/03/19  Yes Reeve Turnley, Almyra Free, PA-C  pantoprazole (PROTONIX) 40 MG tablet Take 1 tablet (40 mg total) by mouth 2 (two) times daily before a meal. 08/10/19   Orson Eva, MD    Family History Family History  Problem Relation Age of Onset  . Other Mother        Homicide  . Colon cancer Neg Hx   . Colon polyps Neg Hx     Social History Social History   Tobacco Use  . Smoking status: Never Smoker  . Smokeless tobacco: Never Used  Substance Use Topics  . Alcohol use: Yes    Comment: 2-3 beers a day when she can afford it  . Drug use: No     Allergies   Patient has no known allergies.   Review of Systems Review of Systems  Constitutional: Positive for appetite change. Negative for diaphoresis and fever.  HENT: Negative for congestion and sore throat.   Eyes: Negative.   Respiratory: Negative for chest tightness and shortness of breath.   Cardiovascular: Negative for chest pain.  Gastrointestinal: Positive for abdominal pain, diarrhea, nausea and vomiting.  Genitourinary: Negative.  Negative for dysuria.  Musculoskeletal: Negative for arthralgias, joint swelling and neck pain.  Skin: Negative.  Negative for rash and wound.  Neurological: Positive for weakness. Negative for dizziness, light-headedness, numbness  and headaches.  Psychiatric/Behavioral: Negative.      Physical Exam Updated Vital Signs BP 113/74 (BP Location: Left Arm)   Pulse 92   Temp 98.6 F (37 C) (Oral)   Resp 16   Ht 5' (1.524 m)   Wt 40.9 kg   SpO2 100%   BMI 17.61 kg/m   Physical Exam Vitals signs and nursing note reviewed.  Constitutional:      General: She is not in acute distress.    Appearance: She is well-developed. She is not toxic-appearing.     Comments: Appears chronically ill, cachectic   HENT:     Head: Normocephalic and atraumatic.      Mouth/Throat:     Mouth: Mucous membranes are moist.     Pharynx: Oropharynx is clear.  Eyes:     Conjunctiva/sclera: Conjunctivae normal.  Neck:     Musculoskeletal: Normal range of motion.  Cardiovascular:     Rate and Rhythm: Regular rhythm. Tachycardia present.     Heart sounds: Normal heart sounds.  Pulmonary:     Effort: Pulmonary effort is normal.     Breath sounds: Normal breath sounds. No wheezing.  Abdominal:     General: Bowel sounds are normal. There is no distension.     Palpations: Abdomen is soft. There is no mass.     Tenderness: There is abdominal tenderness in the right lower quadrant. There is no guarding or rebound.     Comments: Mild soreness without guarding RLQ.  abd scaphoid.  No masses.   Musculoskeletal: Normal range of motion.  Skin:    General: Skin is warm and dry.  Neurological:     Mental Status: She is alert.      ED Treatments / Results  Labs (all labs ordered are listed, but only abnormal results are displayed) Labs Reviewed  URINE CULTURE - Abnormal; Notable for the following components:      Result Value   Culture   (*)    Value: 80,000 COLONIES/mL GROUP B STREP(S.AGALACTIAE)ISOLATED TESTING AGAINST S. AGALACTIAE NOT ROUTINELY PERFORMED DUE TO PREDICTABILITY OF AMP/PEN/VAN SUSCEPTIBILITY. Performed at [facility], [place] [address], [place], [place] [address]    All other components within normal limits  URINALYSIS, ROUTINE W REFLEX MICROSCOPIC - Abnormal; Notable for the following components:   Color, Urine AMBER (*)    APPearance CLOUDY (*)    pH 9.0 (*)    Hgb urine dipstick MODERATE (*)    Ketones, ur 20 (*)    Protein, ur 100 (*)    Bacteria, UA MANY (*)    All other components within normal limits  CBC WITH DIFFERENTIAL/PLATELET - Abnormal; Notable for the following components:   RBC 3.16 (*)    Hemoglobin 9.8 (*)    HCT 29.9 (*)    All other components within normal limits  COMPREHENSIVE METABOLIC PANEL - Abnormal;  Notable for the following components:   Sodium 132 (*)    Potassium 2.8 (*)    Chloride 92 (*)    CO2 21 (*)    Glucose, Bld 113 (*)    Total Protein 8.9 (*)    Total Bilirubin 2.4 (*)    Anion gap 19 (*)    All other components within normal limits  MAGNESIUM - Abnormal; Notable for the following components:   Magnesium 1.6 (*)    All other components within normal limits  BASIC METABOLIC PANEL - Abnormal; Notable for the following components:   Sodium 133 (*)  Glucose, Bld 107 (*)    All other components within normal limits  FOLATE - Abnormal; Notable for the following components:   Folate 2.9 (*)    All other components within normal limits  IRON AND TIBC - Abnormal; Notable for the following components:   TIBC 203 (*)    Saturation Ratios 58 (*)    All other components within normal limits  FERRITIN - Abnormal; Notable for the following components:   Ferritin 696 (*)    All other components within normal limits  BASIC METABOLIC PANEL - Abnormal; Notable for the following components:   Glucose, Bld 124 (*)    BUN 5 (*)    All other components within normal limits  HEPATIC FUNCTION PANEL - Abnormal; Notable for the following components:   Albumin 3.3 (*)    Alkaline Phosphatase 35 (*)    Bilirubin, Direct 0.3 (*)    All other components within normal limits  BASIC METABOLIC PANEL - Abnormal; Notable for the following components:   Sodium 131 (*)    BUN <5 (*)    All other components within normal limits  CBG MONITORING, ED - Abnormal; Notable for the following components:   Glucose-Capillary 116 (*)    All other components within normal limits  SARS CORONAVIRUS 2 (TAT 6-24 HRS)  LIPASE, BLOOD  MAGNESIUM  HIV ANTIBODY (ROUTINE TESTING W REFLEX)  RAPID URINE DRUG SCREEN, HOSP PERFORMED  TSH  T4, FREE  VITAMIN B12  MAGNESIUM  MAGNESIUM  SURGICAL PATHOLOGY  TROPONIN I (HIGH SENSITIVITY)    EKG EKG Interpretation  Date/Time:  Sunday August 07 2019  14:02:41 EST Ventricular Rate:  108 PR Interval:    QRS Duration: 100 QT Interval:  352 QTC Calculation: 472 R Axis:   64 Text Interpretation: Sinus tachycardia Right atrial enlargement Repol abnrm suggests ischemia, anterolateral Since last tracing rate faster Confirmed by Dorie Rank [phone]) on 08/07/2019 2:29:53 PM   Radiology No results found.  Procedures Procedures (including critical care time)  Medications Ordered in ED Medications  dextrose 5 % and 0.9 % NaCl with KCl 20 mEq/L infusion ( Intravenous Stopped 08/09/19 1300)  sodium chloride flush (NS) 0.9 % injection 3 mL (3 mLs Intravenous Given 08/07/19 1412)  sodium chloride 0.9 % bolus 500 mL (0 mLs Intravenous Stopped 08/07/19 1739)  ondansetron (ZOFRAN) injection 4 mg (4 mg Intravenous Given 08/07/19 1513)  cefTRIAXone (ROCEPHIN) 1 g in sodium chloride 0.9 % 100 mL IVPB (0 g Intravenous Stopped 08/07/19 1739)  potassium chloride SA (KLOR-CON) CR tablet 40 mEq (40 mEq Oral Given 08/07/19 1649)  metoCLOPramide (REGLAN) injection 10 mg (10 mg Intravenous Given 08/07/19 1919)  potassium chloride SA (KLOR-CON) CR tablet 40 mEq (40 mEq Oral Given 08/07/19 2100)  magnesium sulfate IVPB 2 g 50 mL (2 g Intravenous New Bag/Given 08/07/19 2159)  magnesium sulfate IVPB 2 g 50 mL (2 g Intravenous New Bag/Given 08/09/19 1520)  ketamine HCl 50 MG/5ML SOSY (has no administration in time range)  artificial tears (LACRILUBE) ophthalmic ointment (has no administration in time range)  mineral oil liquid (has no administration in time range)  phenylephrine 0.4-0.9 MG/10ML-% injection (has no administration in time range)  ePHEDrine 5 MG/ML injection (has no administration in time range)     Initial Impression / Assessment and Plan / ED Course  I have reviewed the triage vital signs and the nursing notes.  Pertinent labs & imaging results that were available during my care of the patient were reviewed by me  and considered in my medical  decision making (see chart for details).       Pt with nausea, vomiting, anorexia, hypokalemia and progressive weakness.  She has a hx of etoh overuse.  Denies etoh today.  She does have a uti and is hypokalemic again (same when here 4 days ago.  Has complaint of abd pain but abd fairly benign, mild ttp right lower quadrant without guarding.  Ct abd/pelvis pending.  PO potassium 40 meq given, difficult very small IV obtained, giving rocephin for uti.  Cx ordered.  Pt with weakness, difficulty walking.  Would benefit from admission, further tx of uti, hydration.     Signed out to Krista Blue PA-C who assumes care.   Final Clinical Impressions(s) / ED Diagnoses   Final diagnoses:  Weakness  Acute cystitis with hematuria  Hypokalemia       Landis Martins 08/07/19 1655    Evalee Jefferson, PA-C 08/11/19 Rossie Muskrat, MD 08/14/19 [phone]

## 2019-08-08 ENCOUNTER — Encounter (HOSPITAL_COMMUNITY): Payer: Self-pay | Admitting: Gastroenterology

## 2019-08-08 DIAGNOSIS — K2100 Gastro-esophageal reflux disease with esophagitis, without bleeding: Secondary | ICD-10-CM | POA: Diagnosis present

## 2019-08-08 DIAGNOSIS — R112 Nausea with vomiting, unspecified: Secondary | ICD-10-CM | POA: Diagnosis present

## 2019-08-08 DIAGNOSIS — Z681 Body mass index (BMI) 19 or less, adult: Secondary | ICD-10-CM | POA: Diagnosis not present

## 2019-08-08 DIAGNOSIS — R824 Acetonuria: Secondary | ICD-10-CM | POA: Diagnosis present

## 2019-08-08 DIAGNOSIS — E872 Acidosis: Secondary | ICD-10-CM | POA: Diagnosis present

## 2019-08-08 DIAGNOSIS — K709 Alcoholic liver disease, unspecified: Secondary | ICD-10-CM | POA: Diagnosis present

## 2019-08-08 DIAGNOSIS — F1010 Alcohol abuse, uncomplicated: Secondary | ICD-10-CM

## 2019-08-08 DIAGNOSIS — R64 Cachexia: Secondary | ICD-10-CM | POA: Diagnosis present

## 2019-08-08 DIAGNOSIS — N3001 Acute cystitis with hematuria: Secondary | ICD-10-CM | POA: Diagnosis present

## 2019-08-08 DIAGNOSIS — K2920 Alcoholic gastritis without bleeding: Secondary | ICD-10-CM | POA: Diagnosis present

## 2019-08-08 DIAGNOSIS — R1310 Dysphagia, unspecified: Secondary | ICD-10-CM | POA: Diagnosis present

## 2019-08-08 DIAGNOSIS — E871 Hypo-osmolality and hyponatremia: Secondary | ICD-10-CM

## 2019-08-08 DIAGNOSIS — E876 Hypokalemia: Secondary | ICD-10-CM | POA: Diagnosis present

## 2019-08-08 DIAGNOSIS — I10 Essential (primary) hypertension: Secondary | ICD-10-CM | POA: Diagnosis present

## 2019-08-08 DIAGNOSIS — K2970 Gastritis, unspecified, without bleeding: Secondary | ICD-10-CM | POA: Diagnosis not present

## 2019-08-08 DIAGNOSIS — Z90710 Acquired absence of both cervix and uterus: Secondary | ICD-10-CM | POA: Diagnosis not present

## 2019-08-08 DIAGNOSIS — E869 Volume depletion, unspecified: Secondary | ICD-10-CM | POA: Diagnosis present

## 2019-08-08 DIAGNOSIS — Z20828 Contact with and (suspected) exposure to other viral communicable diseases: Secondary | ICD-10-CM | POA: Diagnosis present

## 2019-08-08 DIAGNOSIS — D649 Anemia, unspecified: Secondary | ICD-10-CM | POA: Diagnosis present

## 2019-08-08 DIAGNOSIS — E538 Deficiency of other specified B group vitamins: Secondary | ICD-10-CM | POA: Diagnosis present

## 2019-08-08 LAB — RAPID URINE DRUG SCREEN, HOSP PERFORMED
Amphetamines: NOT DETECTED
Barbiturates: NOT DETECTED
Benzodiazepines: NOT DETECTED
Cocaine: NOT DETECTED
Opiates: NOT DETECTED
Tetrahydrocannabinol: NOT DETECTED

## 2019-08-08 LAB — BASIC METABOLIC PANEL
Anion gap: 10 (ref 5–15)
BUN: 10 mg/dL (ref 8–23)
CO2: 22 mmol/L (ref 22–32)
Calcium: 9.1 mg/dL (ref 8.9–10.3)
Chloride: 101 mmol/L (ref 98–111)
Creatinine, Ser: 0.57 mg/dL (ref 0.44–1.00)
GFR calc Af Amer: >60 mL/min (ref >60)
GFR calc non Af Amer: >60 mL/min (ref >60)
Glucose, Bld: 107 mg/dL — ABNORMAL HIGH (ref 70–99)
Potassium: 3.9 mmol/L (ref 3.5–5.1)
Sodium: 133 mmol/L — ABNORMAL LOW (ref 135–145)

## 2019-08-08 LAB — HIV ANTIBODY (ROUTINE TESTING W REFLEX): HIV Screen 4th Generation wRfx: NONREACTIVE

## 2019-08-08 LAB — MAGNESIUM: Magnesium: 2.2 mg/dL (ref 1.7–2.4)

## 2019-08-08 LAB — SARS CORONAVIRUS 2 (TAT 6-24 HRS): SARS Coronavirus 2: NEGATIVE

## 2019-08-08 LAB — IRON AND TIBC
Iron: 117 ug/dL (ref 28–170)
Saturation Ratios: 58 % — ABNORMAL HIGH (ref 10.4–31.8)
TIBC: 203 ug/dL — ABNORMAL LOW (ref 250–450)
UIBC: 86 ug/dL

## 2019-08-08 LAB — TSH: TSH: 2.505 u[IU]/mL (ref 0.350–4.500)

## 2019-08-08 LAB — VITAMIN B12: Vitamin B-12: 391 pg/mL (ref 180–914)

## 2019-08-08 LAB — FERRITIN: Ferritin: 696 ng/mL — ABNORMAL HIGH (ref 11–307)

## 2019-08-08 LAB — FOLATE: Folate: 2.9 ng/mL — ABNORMAL LOW (ref >5.9)

## 2019-08-08 LAB — T4, FREE: Free T4: 1.11 ng/dL (ref 0.61–1.12)

## 2019-08-08 MED ORDER — PANTOPRAZOLE SODIUM 40 MG IV SOLR
40.0000 mg | Freq: Two times a day (BID) | INTRAVENOUS | Status: DC
  Administered 2019-08-08 – 2019-08-09 (×3): 40 mg via INTRAVENOUS
  Filled 2019-08-08 (×3): qty 40

## 2019-08-08 MED ORDER — HYDRALAZINE HCL 20 MG/ML IJ SOLN: 10.0000 mg | Freq: Four times a day (QID) | INTRAMUSCULAR | Status: DC | PRN

## 2019-08-08 NOTE — Consult Note (Signed)
Referring Provider: Dr. Jenetta Downer Primary Care Physician:  [facility] Primary Gastroenterologist:  Dr. Oneida Alar   Date of Admission: 08/07/2019 Date of Consultation: 08/08/2019  Reason for Consultation:  Vomiting. Abdominal CT suggesting gastroparesis or gastric outlet obstruction  HPI:  Judy Davis is a 63 y.o. year old female with a long-standing history of alcohol abuse presenting to the ED with vomiting and weakness. CT abdomen/plevis without contrast shows multiple calcifications through pancreas as can be seen with chronic pancreatitis, distended stomach with air-fluid level, possibly related to gastroparesis and unable to exclude outlet obstruction. Hypokalemic on admission, now replaced.  Possible UTI on admission, started on ceftriaxone 1 g daily. Chronic normocytic anemia at baseline. She and her husband are both difficult historians.   Throwing up everything she was trying to eat yesterday. N/V for 2 days. She reports a good appetite at beginning of visit, then states her appetite is poor. No abdominal pain. Sometimes solid food dysphagia. Unable to elaborate. Just feels tired. Mobic prn. Unable to quantify how much. Takes Advil at home when she has a headache. +reflux at times. Feels full. Denies weight loss. No rectal bleeding. No prior EGD.   +ETOH. Drinks every other day. "loves beer". States she drinks a quart but "not every day".   No prior EGD or colonoscopy. She was 128 lbs in July 2019 during ED visit. Now with 2 different weights from yesterday: 90-110.   Past Medical History:  Diagnosis Date  . ETOH abuse   . HTN (hypertension)     Past Surgical History:  Procedure Laterality Date  . ABDOMINAL HYSTERECTOMY  1970's    Prior to Admission medications   Medication Sig Start Date End Date Taking? Authorizing Provider  ondansetron (ZOFRAN ODT) 4 MG disintegrating tablet Take 1 tablet (4 mg total) by mouth every 8 (eight) hours as  needed for nausea or vomiting. 08/03/19  Yes Idol, Almyra Free, PA-C  meloxicam (MOBIC) 7.5 MG tablet Take 2 tablets (15 mg total) by mouth daily. Patient not taking: Reported on 08/03/2019 03/18/18   Noemi Chapel, MD    Current Facility-Administered Medications  Medication Dose Route Frequency Provider Last Rate Last Dose  . acetaminophen (TYLENOL) tablet 650 mg  650 mg Oral Q6H PRN Emokpae, Ejiroghene E, MD       Or  . acetaminophen (TYLENOL) suppository 650 mg  650 mg Rectal Q6H PRN Emokpae, Ejiroghene E, MD      . dextrose 5 % and 0.9 % NaCl with KCl 20 mEq/L infusion   Intravenous Continuous Emokpae, Ejiroghene E, MD 100 mL/hr at 08/08/19 0535    . folic acid (FOLVITE) tablet 1 mg  1 mg Oral Daily Emokpae, Ejiroghene E, MD   1 mg at 08/08/19 0950  . hydrALAZINE (APRESOLINE) injection 10 mg  10 mg Intravenous Q6H PRN Tat, David, MD      . LORazepam (ATIVAN) tablet 1-4 mg  1-4 mg Oral Q1H PRN Emokpae, Ejiroghene E, MD       Or  . LORazepam (ATIVAN) injection 1-4 mg  1-4 mg Intravenous Q1H PRN Emokpae, Ejiroghene E, MD      . multivitamin with minerals tablet 1 tablet  1 tablet Oral Daily Emokpae, Ejiroghene E, MD   1 tablet at 08/08/19 0950  . ondansetron (ZOFRAN) tablet 4 mg  4 mg Oral Q6H PRN Emokpae, Ejiroghene E, MD       Or  . ondansetron (ZOFRAN) injection 4 mg  4 mg Intravenous Q6H PRN  Emokpae, Ejiroghene E, MD      . ondansetron (ZOFRAN) injection 4 mg  4 mg Intravenous Once Corena Herter, PA-C   Stopped at 08/07/19 1949  . pantoprazole (PROTONIX) injection 40 mg  40 mg Intravenous Therisa Doyne, MD   40 mg at 08/08/19 0950  . polyethylene glycol (MIRALAX / GLYCOLAX) packet 17 g  17 g Oral Daily PRN Emokpae, Ejiroghene E, MD      . thiamine (VITAMIN B-1) tablet 100 mg  100 mg Oral Daily Emokpae, Ejiroghene E, MD   100 mg at 08/08/19 0950   Or  . thiamine (B-1) injection 100 mg  100 mg Intravenous Daily Emokpae, Ejiroghene E, MD   100 mg at 08/07/19 2215    Allergies as of  08/07/2019  . (No Known Allergies)    Family History  Problem Relation Age of Onset  . Other Mother        Homicide  . Colon cancer Neg Hx   . Colon polyps Neg Hx     Social History   Socioeconomic History  . Marital status: Married    Spouse name: Not on file  . Number of children: Not on file  . Years of education: Not on file  . Highest education level: Not on file  Occupational History  . Occupation: unemployed  Social Needs  . Financial resource strain: Not on file  . Food insecurity    Worry: Not on file    Inability: Not on file  . Transportation needs    Medical: Not on file    Non-medical: Not on file  Tobacco Use  . Smoking status: Never Smoker  . Smokeless tobacco: Never Used  Substance and Sexual Activity  . Alcohol use: Yes    Comment: 2-3 beers a day when she can afford it  . Drug use: No  . Sexual activity: Not on file  Lifestyle  . Physical activity    Days per week: Not on file    Minutes per session: Not on file  . Stress: Not on file  Relationships  . Social Herbalist on phone: Not on file    Gets together: Not on file    Attends religious service: Not on file    Active member of club or organization: Not on file    Attends meetings of clubs or organizations: Not on file    Relationship status: Not on file  . Intimate partner violence    Fear of current or ex partner: Not on file    Emotionally abused: Not on file    Physically abused: Not on file    Forced sexual activity: Not on file  Other Topics Concern  . Not on file  Social History Narrative  . Not on file    Review of Systems: Gen: see HPI CV: Denies chest pain, heart palpitations, syncope, edema  Resp: Denies shortness of breath with rest, cough, wheezing GI: see HPI GU : Denies urinary burning, urinary frequency, urinary incontinence.  MS: Denies joint pain,swelling, cramping Derm: Denies rash, itching, dry skin Psych: Denies depression, anxiety,confusion, or  memory loss Heme: see HPI   Physical Exam: Vital signs in last 24 hours: Temp:  [97.5 F (36.4 C)-98.7 F (37.1 C)] 97.5 F (36.4 C) (11/23 1109) Pulse Rate:  [72-106] 72 (11/23 1109) Resp:  [12-19] 19 (11/23 1109) BP: (123-184)/(73-117) 123/73 (11/23 1109) SpO2:  [100 %] 100 % (11/23 1109) Weight:  [40.9 kg-50 kg] 40.9 kg (  11/22 2053) Last BM Date: (UTA) General:   Alert, thin, no distress.  Head:  Normocephalic and atraumatic. Eyes:  Sclera clear, no icterus.    Ears:  Hard of hearing Lungs:  Clear throughout to auscultation.    Heart:  S1 S2 present without murmurs Abdomen:  Soft, nontender and nondistended. No masses, hepatosplenomegaly or hernias noted. Normal bowel sounds, without guarding, and without rebound.   Rectal:  Deferred  Msk:  Symmetrical without gross deformities. Normal posture. Extremities:  Without edema. Neurologic:  Alert and  oriented x4 Psych:  Alert and cooperative. Normal mood and affect.  Intake/Output from previous day: 11/22 0701 - 11/23 0700 In: 1373.7 [I.V.:1323.7; IV Piggyback:50] Out: 200 [Urine:200] Intake/Output this shift: No intake/output data recorded.  Lab Results: Recent Labs    08/07/19 1421  WBC 5.6  HGB 9.8*  HCT 29.9*  PLT 347   BMET Recent Labs    08/07/19 1421 08/08/19 0654  NA 132* 133*  K 2.8* 3.9  CL 92* 101  CO2 21* 22  GLUCOSE 113* 107*  BUN 13 10  CREATININE 0.71 0.57  CALCIUM 10.1 9.1   LFT Recent Labs    08/07/19 1421  PROT 8.9*  ALBUMIN 4.3  AST 39  ALT 22  ALKPHOS 45  BILITOT 2.4*    Studies/Results: Ct Abdomen Pelvis Wo Contrast  Result Date: 08/07/2019 CLINICAL DATA:  Acute abdominal pain. Vomiting and diarrhea. EXAM: CT ABDOMEN AND PELVIS WITHOUT CONTRAST TECHNIQUE: Multidetector CT imaging of the abdomen and pelvis was performed following the standard protocol without IV contrast. COMPARISON:  CT dated September 29, 2016. FINDINGS: Lower chest: The lung bases are clear. The heart size  is normal. Hepatobiliary: There is a 3.4 cm cyst in the right hepatic lobe. Normal gallbladder.There is no biliary ductal dilation. Pancreas: There are multiple calcifications throughout the pancreas. These can be seen with chronic pancreatitis. Spleen: No splenic laceration or hematoma. Adrenals/Urinary Tract: --Adrenal glands: No adrenal hemorrhage. --Right kidney/ureter: The right kidney is malrotated with no evidence for right-sided hydronephrosis or radiopaque obstructing kidney stone --Left kidney/ureter: The left kidney is somewhat malrotated without evidence for hydronephrosis or radiopaque stone. --Urinary bladder: Unremarkable. Stomach/Bowel: --Stomach/Duodenum: The stomach is distended. An air-fluid level is noted. --Small bowel: No dilatation or inflammation. --Colon: No focal abnormality. --Appendix: Not visualized. No right lower quadrant inflammation or free fluid. Vascular/Lymphatic: Atherosclerotic calcification is present within the non-aneurysmal abdominal aorta, without hemodynamically significant stenosis. --No retroperitoneal lymphadenopathy. --No mesenteric lymphadenopathy. --No pelvic or inguinal lymphadenopathy. Reproductive: Status post hysterectomy. No adnexal mass. Other: No ascites or free air. The abdominal wall is normal. Musculoskeletal. No acute displaced fractures. IMPRESSION: 1. The stomach is distended with air-fluid level. Findings may be secondary to gastroparesis or outlet obstruction. 2. Multiple calcifications throughout the pancreas. These can be seen with chronic pancreatitis. Aortic Atherosclerosis (ICD10-I70.0). Electronically Signed   By: Constance Holster M.D.   On: 08/07/2019 18:05    Impression: 63 year old female with history of alcohol abuse, presenting with weakness and refractory N/V and findings of gastric distension on CT, concerning for gastroparesis or gastric outlet obstruction. Possible UTI likely contributing to symptoms, with urine cultures pending.  N/V resolved at time of consultation. Reporting vague solid food dysphagia, decreased appetite, and weight loss; both she and her husband are both difficult historians. I do note she has lost weight from a year ago. Endorses Advil and also has Mobic listed on her outpatient med list without PPI.   Needs EGD +/- dilation with  Dr. Oneida Alar using Propofol. The risks and benefits were discussed with patient and stated understanding. Husband at bedside as well. Will have her stay NPO except ice chips.  Pancreatic calcifications: likely due to chronic pancreatitis. History of alcohol use. Asymptomatic today. May benefit from pancreatic enzymes once resuming diet.  Elevated bilirubin: isolated elevation with transaminases and alk phos normal. Non-specific. Recheck HFP tomorrow morning.   Normocytic anemia: chronic. Folate deficiency. No recent iron studies.   Plan: Remain NPO except ice chips and sips with meds EGD+/- dilation by Dr. Oneida Alar on 11/24 with Propofol Recheck HFP tomorrow Add iron studies Consider pancreatic enzymes once diet resumed after acute illness addressed Avoid NSAIDs Continue PPI BID   Annitta Needs, PhD, ANP-BC [facility]    LOS: 0 days    08/08/2019, 11:21 AM

## 2019-08-08 NOTE — Progress Notes (Signed)
Pt needs a urine sample obtained, purewick placed to help obtain the sample since patient isn't completely alert and oriented and has an unsteady gait. Will obtain once a sample is in canister.

## 2019-08-08 NOTE — Progress Notes (Signed)
New order to obtain bladder scan and in/out cath if greater than 272mL.

## 2019-08-08 NOTE — H&P (View-Only) (Signed)
Referring Provider: Dr. Jenetta Downer Primary Care Physician:  [facility] Primary Gastroenterologist:  Dr. Oneida Alar   Date of Admission: 08/07/2019 Date of Consultation: 08/08/2019  Reason for Consultation:  Vomiting. Abdominal CT suggesting gastroparesis or gastric outlet obstruction  HPI:  Judy Davis is a 63 y.o. year old female with a long-standing history of alcohol abuse presenting to the ED with vomiting and weakness. CT abdomen/plevis without contrast shows multiple calcifications through pancreas as can be seen with chronic pancreatitis, distended stomach with air-fluid level, possibly related to gastroparesis and unable to exclude outlet obstruction. Hypokalemic on admission, now replaced.  Possible UTI on admission, started on ceftriaxone 1 g daily. Chronic normocytic anemia at baseline. She and her husband are both difficult historians.   Throwing up everything she was trying to eat yesterday. N/V for 2 days. She reports a good appetite at beginning of visit, then states her appetite is poor. No abdominal pain. Sometimes solid food dysphagia. Unable to elaborate. Just feels tired. Mobic prn. Unable to quantify how much. Takes Advil at home when she has a headache. +reflux at times. Feels full. Denies weight loss. No rectal bleeding. No prior EGD.   +ETOH. Drinks every other day. "loves beer". States she drinks a quart but "not every day".   No prior EGD or colonoscopy. She was 128 lbs in July 2019 during ED visit. Now with 2 different weights from yesterday: 90-110.   Past Medical History:  Diagnosis Date  . ETOH abuse   . HTN (hypertension)     Past Surgical History:  Procedure Laterality Date  . ABDOMINAL HYSTERECTOMY  1970's    Prior to Admission medications   Medication Sig Start Date End Date Taking? Authorizing Provider  ondansetron (ZOFRAN ODT) 4 MG disintegrating tablet Take 1 tablet (4 mg total) by mouth every 8 (eight) hours as  needed for nausea or vomiting. 08/03/19  Yes Idol, Almyra Free, PA-C  meloxicam (MOBIC) 7.5 MG tablet Take 2 tablets (15 mg total) by mouth daily. Patient not taking: Reported on 08/03/2019 03/18/18   Noemi Chapel, MD    Current Facility-Administered Medications  Medication Dose Route Frequency Provider Last Rate Last Dose  . acetaminophen (TYLENOL) tablet 650 mg  650 mg Oral Q6H PRN Emokpae, Ejiroghene E, MD       Or  . acetaminophen (TYLENOL) suppository 650 mg  650 mg Rectal Q6H PRN Emokpae, Ejiroghene E, MD      . dextrose 5 % and 0.9 % NaCl with KCl 20 mEq/L infusion   Intravenous Continuous Emokpae, Ejiroghene E, MD 100 mL/hr at 08/08/19 0535    . folic acid (FOLVITE) tablet 1 mg  1 mg Oral Daily Emokpae, Ejiroghene E, MD   1 mg at 08/08/19 0950  . hydrALAZINE (APRESOLINE) injection 10 mg  10 mg Intravenous Q6H PRN Tat, David, MD      . LORazepam (ATIVAN) tablet 1-4 mg  1-4 mg Oral Q1H PRN Emokpae, Ejiroghene E, MD       Or  . LORazepam (ATIVAN) injection 1-4 mg  1-4 mg Intravenous Q1H PRN Emokpae, Ejiroghene E, MD      . multivitamin with minerals tablet 1 tablet  1 tablet Oral Daily Emokpae, Ejiroghene E, MD   1 tablet at 08/08/19 0950  . ondansetron (ZOFRAN) tablet 4 mg  4 mg Oral Q6H PRN Emokpae, Ejiroghene E, MD       Or  . ondansetron (ZOFRAN) injection 4 mg  4 mg Intravenous Q6H PRN  Emokpae, Ejiroghene E, MD      . ondansetron (ZOFRAN) injection 4 mg  4 mg Intravenous Once Corena Herter, PA-C   Stopped at 08/07/19 1949  . pantoprazole (PROTONIX) injection 40 mg  40 mg Intravenous Therisa Doyne, MD   40 mg at 08/08/19 0950  . polyethylene glycol (MIRALAX / GLYCOLAX) packet 17 g  17 g Oral Daily PRN Emokpae, Ejiroghene E, MD      . thiamine (VITAMIN B-1) tablet 100 mg  100 mg Oral Daily Emokpae, Ejiroghene E, MD   100 mg at 08/08/19 0950   Or  . thiamine (B-1) injection 100 mg  100 mg Intravenous Daily Emokpae, Ejiroghene E, MD   100 mg at 08/07/19 2215    Allergies as of  08/07/2019  . (No Known Allergies)    Family History  Problem Relation Age of Onset  . Other Mother        Homicide  . Colon cancer Neg Hx   . Colon polyps Neg Hx     Social History   Socioeconomic History  . Marital status: Married    Spouse name: Not on file  . Number of children: Not on file  . Years of education: Not on file  . Highest education level: Not on file  Occupational History  . Occupation: unemployed  Social Davis  . Financial resource strain: Not on file  . Food insecurity    Worry: Not on file    Inability: Not on file  . Transportation Davis    Medical: Not on file    Non-medical: Not on file  Tobacco Use  . Smoking status: Never Smoker  . Smokeless tobacco: Never Used  Substance and Sexual Activity  . Alcohol use: Yes    Comment: 2-3 beers a day when she can afford it  . Drug use: No  . Sexual activity: Not on file  Lifestyle  . Physical activity    Days per week: Not on file    Minutes per session: Not on file  . Stress: Not on file  Relationships  . Social Herbalist on phone: Not on file    Gets together: Not on file    Attends religious service: Not on file    Active member of club or organization: Not on file    Attends meetings of clubs or organizations: Not on file    Relationship status: Not on file  . Intimate partner violence    Fear of current or ex partner: Not on file    Emotionally abused: Not on file    Physically abused: Not on file    Forced sexual activity: Not on file  Other Topics Concern  . Not on file  Social History Narrative  . Not on file    Review of Systems: Gen: see HPI CV: Denies chest pain, heart palpitations, syncope, edema  Resp: Denies shortness of breath with rest, cough, wheezing GI: see HPI GU : Denies urinary burning, urinary frequency, urinary incontinence.  MS: Denies joint pain,swelling, cramping Derm: Denies rash, itching, dry skin Psych: Denies depression, anxiety,confusion, or  memory loss Heme: see HPI   Physical Exam: Vital signs in last 24 hours: Temp:  [97.5 F (36.4 C)-98.7 F (37.1 C)] 97.5 F (36.4 C) (11/23 1109) Pulse Rate:  [72-106] 72 (11/23 1109) Resp:  [12-19] 19 (11/23 1109) BP: (123-184)/(73-117) 123/73 (11/23 1109) SpO2:  [100 %] 100 % (11/23 1109) Weight:  [40.9 kg-50 kg] 40.9 kg (  11/22 2053) Last BM Date: (UTA) General:   Alert, thin, no distress.  Head:  Normocephalic and atraumatic. Eyes:  Sclera clear, no icterus.    Ears:  Hard of hearing Lungs:  Clear throughout to auscultation.    Heart:  S1 S2 present without murmurs Abdomen:  Soft, nontender and nondistended. No masses, hepatosplenomegaly or hernias noted. Normal bowel sounds, without guarding, and without rebound.   Rectal:  Deferred  Msk:  Symmetrical without gross deformities. Normal posture. Extremities:  Without edema. Neurologic:  Alert and  oriented x4 Psych:  Alert and cooperative. Normal mood and affect.  Intake/Output from previous day: 11/22 0701 - 11/23 0700 In: 1373.7 [I.V.:1323.7; IV Piggyback:50] Out: 200 [Urine:200] Intake/Output this shift: No intake/output data recorded.  Lab Results: Recent Labs    08/07/19 1421  WBC 5.6  HGB 9.8*  HCT 29.9*  PLT 347   BMET Recent Labs    08/07/19 1421 08/08/19 0654  NA 132* 133*  K 2.8* 3.9  CL 92* 101  CO2 21* 22  GLUCOSE 113* 107*  BUN 13 10  CREATININE 0.71 0.57  CALCIUM 10.1 9.1   LFT Recent Labs    08/07/19 1421  PROT 8.9*  ALBUMIN 4.3  AST 39  ALT 22  ALKPHOS 45  BILITOT 2.4*    Studies/Results: Ct Abdomen Pelvis Wo Contrast  Result Date: 08/07/2019 CLINICAL DATA:  Acute abdominal pain. Vomiting and diarrhea. EXAM: CT ABDOMEN AND PELVIS WITHOUT CONTRAST TECHNIQUE: Multidetector CT imaging of the abdomen and pelvis was performed following the standard protocol without IV contrast. COMPARISON:  CT dated September 29, 2016. FINDINGS: Lower chest: The lung bases are clear. The heart size  is normal. Hepatobiliary: There is a 3.4 cm cyst in the right hepatic lobe. Normal gallbladder.There is no biliary ductal dilation. Pancreas: There are multiple calcifications throughout the pancreas. These can be seen with chronic pancreatitis. Spleen: No splenic laceration or hematoma. Adrenals/Urinary Tract: --Adrenal glands: No adrenal hemorrhage. --Right kidney/ureter: The right kidney is malrotated with no evidence for right-sided hydronephrosis or radiopaque obstructing kidney stone --Left kidney/ureter: The left kidney is somewhat malrotated without evidence for hydronephrosis or radiopaque stone. --Urinary bladder: Unremarkable. Stomach/Bowel: --Stomach/Duodenum: The stomach is distended. An air-fluid level is noted. --Small bowel: No dilatation or inflammation. --Colon: No focal abnormality. --Appendix: Not visualized. No right lower quadrant inflammation or free fluid. Vascular/Lymphatic: Atherosclerotic calcification is present within the non-aneurysmal abdominal aorta, without hemodynamically significant stenosis. --No retroperitoneal lymphadenopathy. --No mesenteric lymphadenopathy. --No pelvic or inguinal lymphadenopathy. Reproductive: Status post hysterectomy. No adnexal mass. Other: No ascites or free air. The abdominal wall is normal. Musculoskeletal. No acute displaced fractures. IMPRESSION: 1. The stomach is distended with air-fluid level. Findings may be secondary to gastroparesis or outlet obstruction. 2. Multiple calcifications throughout the pancreas. These can be seen with chronic pancreatitis. Aortic Atherosclerosis (ICD10-I70.0). Electronically Signed   By: Constance Holster M.D.   On: 08/07/2019 18:05    Impression: 63 year old female with history of alcohol abuse, presenting with weakness and refractory N/V and findings of gastric distension on CT, concerning for gastroparesis or gastric outlet obstruction. Possible UTI likely contributing to symptoms, with urine cultures pending.  N/V resolved at time of consultation. Reporting vague solid food dysphagia, decreased appetite, and weight loss; both she and her husband are both difficult historians. I do note she has lost weight from a year ago. Endorses Advil and also has Mobic listed on her outpatient med list without PPI.   Davis EGD +/- dilation with  Dr. Oneida Alar using Propofol. The risks and benefits were discussed with patient and stated understanding. Husband at bedside as well. Will have her stay NPO except ice chips.  Pancreatic calcifications: likely due to chronic pancreatitis. History of alcohol use. Asymptomatic today. May benefit from pancreatic enzymes once resuming diet.  Elevated bilirubin: isolated elevation with transaminases and alk phos normal. Non-specific. Recheck HFP tomorrow morning.   Normocytic anemia: chronic. Folate deficiency. No recent iron studies.   Plan: Remain NPO except ice chips and sips with meds EGD+/- dilation by Dr. Oneida Alar on 11/24 with Propofol Recheck HFP tomorrow Add iron studies Consider pancreatic enzymes once diet resumed after acute illness addressed Avoid NSAIDs Continue PPI BID   Judy Needs, PhD, ANP-BC [facility]    LOS: 0 days    08/08/2019, 11:21 AM

## 2019-08-08 NOTE — Progress Notes (Signed)
Patient hasn't urinated since being transferred from ED @ 2051. D5NS KCL 58mEq infusing at 134ml/hr. Midlevel notified.

## 2019-08-08 NOTE — Progress Notes (Signed)
Bladder scan showed 177 mL. Patient was able to urinate in the bedpan.

## 2019-08-08 NOTE — Progress Notes (Addendum)
Pt has not voided all shift, bladder scan completed, 624mL shown on scan. MD made aware, order for in and out cath. Upon getting ready to complete the I&O cath, noticed patient had soaked the bed. Bladder scanned again and patient had 200 mL left in bladder. Attempted to I&O cath and couldn't complete due to meeting resistance and discharge in the area. MD made aware, stated it was fine not to complete the I&O cath and just monitor output.

## 2019-08-08 NOTE — Progress Notes (Signed)
PROGRESS NOTE  Judy Davis [id number] DOB: [birth year] DOA: 08/07/2019 PCP: [facility]  Brief History:  63 year old female with a history of hypertension and alcohol abuse presenting with nausea, vomiting, and generalized weakness for 3 to 4 days.  The patient was also complaining of some intermittent abdominal pain.  Unfortunately, the patient is a very difficult and challenging historian.  Attempts were made to also discuss with the patient's spouse, and he is also a very challenging historian.  Nevertheless, the patient presented to the emergency department on 08/03/2019 with similar symptoms.  She was treated symptomatically and discharged home in stable condition.  Unfortunately, she returns with similar symptoms.  She denied any fevers, chills, chest pain, shortness of breath, diarrhea.  It is unclear exactly how much she drinks each day, but she continues to drink beer on a daily basis. In the emergency department, the patient was afebrile hemodynamically stable with oxygen saturation 100% room air.  BMP shows sodium 132, potassium 2.8.  LFTs were essentially unremarkable.  WBC 5.6, hemoglobin 9.8, platelets 247,000.  CT of the abdomen and pelvis showed a distended stomach with air-fluid levels.  Otherwise, the CT of the abdomen/pelvis was unremarkable.  The patient had difficulty tolerating liquids.  As result, patient was admitted for further evaluation and treatment.  Assessment/Plan: Intractable nausea and vomiting -Due to alcoholic gastritis/esophagitis -Start PPI -Clear liquid diet -Continue antiemetics -Continue IV fluids -personally reviewed EKG--sinus, nonspecific T wave changes  Alcohol abuse/alcoholic ketosis -Ketonuria was noted in the UA -Alcohol withdrawal protocol  -Urine drug screen  Essential hypertension -Patient is not on any agents in the outpatient setting -Monitor blood pressures off antihypertensive agents  Normocytic  anemia -Serum [id number] -Folic acid  Hypokalemia/hypomagnesemia -Replete  Hyponatremia -Secondary to volume depletion in the setting of alcoholic liver disease      Disposition Plan:   Home in 1-2 days  Family Communication:   Spouse updated on phone 11/23  Consultants:  none  Code Status:  FULL   DVT Prophylaxis:  SCDs   Procedures: As Listed in Progress Note Above  Antibiotics: Ceftriaxone 08/07/19>>>    Subjective: Pt denies f/c cp, sob.  She denies vomiting currently.  Remainder ROS unobtainable due to cognitive impairment.  Objective: Vitals:   08/07/19 2000 08/07/19 2030 08/07/19 2053 08/08/19 0442  BP: (!) 156/89 (!) 142/88 (!) 143/85 131/81  Pulse: 83 89 93 76  Resp:   16 13  Temp:   98.5 F (36.9 C) 98.1 F (36.7 C)  TempSrc:   Oral   SpO2: 100% 100% 100% 100%  Weight:   40.9 kg   Height:        Intake/Output Summary (Last 24 hours) at 08/08/2019 0831 Last data filed at 08/08/2019 [id number] Gross per 24 hour  Intake 1373.74 ml  Output 200 ml  Net 1173.74 ml   Weight change:  Exam:   General:  Pt is alert, follows commands appropriately, not in acute distress  HEENT: No icterus, No thrush, No neck mass, [place]/AT  Cardiovascular: RRR, S1/S2, no rubs, no gallops  Respiratory: CTA bilaterally, no wheezing, no crackles, no rhonchi  Abdomen: Soft/+BS, non tender, non distended, no guarding  Extremities: No edema, No lymphangitis, No petechiae, No rashes, no synovitis   Data Reviewed: I have personally reviewed following labs and imaging studies Basic Metabolic Panel: Recent Labs  Lab 08/03/19 0853 08/03/19 1309 08/07/19 1421 08/08/19 0654  NA 131*  --  132* 133*  K 2.6* 3.4* 2.8* 3.9  CL 87*  --  92* 101  CO2 23  --  21* 22  GLUCOSE 126*  --  113* 107*  BUN 13  --  13 10  CREATININE 0.63  --  0.71 0.57  CALCIUM 10.1  --  10.1 9.1  MG  --   --  1.6* 2.2   Liver Function Tests: Recent Labs  Lab 08/03/19 0853 08/07/19 1421  AST 38 39   ALT 21 22  ALKPHOS 43 45  BILITOT 2.1* 2.4*  PROT 8.9* 8.9*  ALBUMIN 4.3 4.3   Recent Labs  Lab 08/03/19 0853 08/07/19 1421  LIPASE 20 19   No results for input(s): AMMONIA in the last 168 hours. Coagulation Profile: No results for input(s): INR, PROTIME in the last 168 hours. CBC: Recent Labs  Lab 08/03/19 0853 08/07/19 1421  WBC 5.8 5.6  NEUTROABS 3.8 3.4  HGB 9.9* 9.8*  HCT 30.0* 29.9*  MCV 95.5 94.6  PLT 322 347   Cardiac Enzymes: No results for input(s): CKTOTAL, CKMB, CKMBINDEX, TROPONINI in the last 168 hours. BNP: Invalid input(s): POCBNP CBG: Recent Labs  Lab 08/07/19 1422  GLUCAP 116*   HbA1C: No results for input(s): HGBA1C in the last 72 hours. Urine analysis:    Component Value Date/Time   COLORURINE AMBER (A) 08/07/2019 1347   APPEARANCEUR CLOUDY (A) 08/07/2019 1347   LABSPEC 1.013 08/07/2019 1347   PHURINE 9.0 (H) 08/07/2019 1347   GLUCOSEU NEGATIVE 08/07/2019 1347   HGBUR MODERATE (A) 08/07/2019 1347   BILIRUBINUR NEGATIVE 08/07/2019 1347   KETONESUR 20 (A) 08/07/2019 1347   PROTEINUR 100 (A) 08/07/2019 1347   UROBILINOGEN 0.2 05/10/2015 1000   NITRITE NEGATIVE 08/07/2019 1347   LEUKOCYTESUR NEGATIVE 08/07/2019 1347   Sepsis Labs: @LABRCNTIP (procalcitonin:4,lacticidven:4) )No results found for this or any previous visit (from the past 240 hour(s)).   Scheduled Meds: . folic acid  1 mg Oral Daily  . multivitamin with minerals  1 tablet Oral Daily  . ondansetron  4 mg Intravenous Once  . thiamine  100 mg Oral Daily   Or  . thiamine  100 mg Intravenous Daily   Continuous Infusions: . cefTRIAXone (ROCEPHIN)  IV    . dextrose 5 % and 0.9 % NaCl with KCl 20 mEq/L 100 mL/hr at 08/08/19 0535    Procedures/Studies: Ct Abdomen Pelvis Wo Contrast  Result Date: 08/07/2019 CLINICAL DATA:  Acute abdominal pain. Vomiting and diarrhea. EXAM: CT ABDOMEN AND PELVIS WITHOUT CONTRAST TECHNIQUE: Multidetector CT imaging of the abdomen and  pelvis was performed following the standard protocol without IV contrast. COMPARISON:  CT dated September 29, 2016. FINDINGS: Lower chest: The lung bases are clear. The heart size is normal. Hepatobiliary: There is a 3.4 cm cyst in the right hepatic lobe. Normal gallbladder.There is no biliary ductal dilation. Pancreas: There are multiple calcifications throughout the pancreas. These can be seen with chronic pancreatitis. Spleen: No splenic laceration or hematoma. Adrenals/Urinary Tract: --Adrenal glands: No adrenal hemorrhage. --Right kidney/ureter: The right kidney is malrotated with no evidence for right-sided hydronephrosis or radiopaque obstructing kidney stone --Left kidney/ureter: The left kidney is somewhat malrotated without evidence for hydronephrosis or radiopaque stone. --Urinary bladder: Unremarkable. Stomach/Bowel: --Stomach/Duodenum: The stomach is distended. An air-fluid level is noted. --Small bowel: No dilatation or inflammation. --Colon: No focal abnormality. --Appendix: Not visualized. No right lower quadrant inflammation or free fluid. Vascular/Lymphatic: Atherosclerotic calcification is present within the non-aneurysmal abdominal aorta, without hemodynamically significant stenosis. --  No retroperitoneal lymphadenopathy. --No mesenteric lymphadenopathy. --No pelvic or inguinal lymphadenopathy. Reproductive: Status post hysterectomy. No adnexal mass. Other: No ascites or free air. The abdominal wall is normal. Musculoskeletal. No acute displaced fractures. IMPRESSION: 1. The stomach is distended with air-fluid level. Findings may be secondary to gastroparesis or outlet obstruction. 2. Multiple calcifications throughout the pancreas. These can be seen with chronic pancreatitis. Aortic Atherosclerosis (ICD10-I70.0). Electronically Signed   By: Constance Holster M.D.   On: 08/07/2019 18:05    Orson Eva, DO  [facility] Pager [phone]  If 7PM-7AM, please contact night-coverage  [email or web address] Password [email or web address] 08/08/2019, 8:31 AM   LOS: 0 days

## 2019-08-09 ENCOUNTER — Inpatient Hospital Stay (HOSPITAL_COMMUNITY): Payer: Medicaid Other | Admitting: Anesthesiology

## 2019-08-09 ENCOUNTER — Encounter (HOSPITAL_COMMUNITY)
Admission: EM | Disposition: A | Discharge status: Home Health | Payer: Self-pay | Source: Home / Self Care | Attending: Internal Medicine

## 2019-08-09 ENCOUNTER — Encounter (HOSPITAL_COMMUNITY): Payer: Self-pay | Admitting: *Deleted

## 2019-08-09 DIAGNOSIS — K2970 Gastritis, unspecified, without bleeding: Secondary | ICD-10-CM

## 2019-08-09 DIAGNOSIS — K2100 Gastro-esophageal reflux disease with esophagitis, without bleeding: Secondary | ICD-10-CM

## 2019-08-09 HISTORY — PX: ESOPHAGOGASTRODUODENOSCOPY (EGD) WITH PROPOFOL: SHX5813

## 2019-08-09 LAB — HEPATIC FUNCTION PANEL
ALT: 16 U/L (ref 0–44)
AST: 30 U/L (ref 15–41)
Albumin: 3.3 g/dL — ABNORMAL LOW (ref 3.5–5.0)
Alkaline Phosphatase: 35 U/L — ABNORMAL LOW (ref 38–126)
Bilirubin, Direct: 0.3 mg/dL — ABNORMAL HIGH (ref 0.0–0.2)
Indirect Bilirubin: 0.8 mg/dL (ref 0.3–0.9)
Total Bilirubin: 1.1 mg/dL (ref 0.3–1.2)
Total Protein: 6.8 g/dL (ref 6.5–8.1)

## 2019-08-09 LAB — BASIC METABOLIC PANEL
Anion gap: 7 (ref 5–15)
BUN: 5 mg/dL — ABNORMAL LOW (ref 8–23)
CO2: 22 mmol/L (ref 22–32)
Calcium: 9 mg/dL (ref 8.9–10.3)
Chloride: 107 mmol/L (ref 98–111)
Creatinine, Ser: 0.5 mg/dL (ref 0.44–1.00)
GFR calc Af Amer: >60 mL/min (ref >60)
GFR calc non Af Amer: >60 mL/min (ref >60)
Glucose, Bld: 124 mg/dL — ABNORMAL HIGH (ref 70–99)
Potassium: 4.3 mmol/L (ref 3.5–5.1)
Sodium: 136 mmol/L (ref 135–145)

## 2019-08-09 LAB — URINE CULTURE: Culture: 80000 — AB

## 2019-08-09 LAB — MAGNESIUM: Magnesium: 1.8 mg/dL (ref 1.7–2.4)

## 2019-08-09 SURGERY — ESOPHAGOGASTRODUODENOSCOPY (EGD) WITH PROPOFOL
Anesthesia: General

## 2019-08-09 MED ORDER — HYDROMORPHONE HCL 1 MG/ML IJ SOLN: 0.2500 mg | INTRAMUSCULAR | Status: DC | PRN

## 2019-08-09 MED ORDER — KETAMINE HCL 10 MG/ML IJ SOLN
INTRAMUSCULAR | Status: DC | PRN
  Administered 2019-08-09: 20 mg via INTRAVENOUS

## 2019-08-09 MED ORDER — LACTATED RINGERS IV SOLN
INTRAVENOUS | Status: DC
  Administered 2019-08-09: 10:00:00 via INTRAVENOUS

## 2019-08-09 MED ORDER — MINERAL OIL PO OIL
TOPICAL_OIL | ORAL | Status: AC
  Filled 2019-08-09: qty 30

## 2019-08-09 MED ORDER — EPHEDRINE 5 MG/ML INJ
INTRAVENOUS | Status: AC
  Filled 2019-08-09: qty 10

## 2019-08-09 MED ORDER — PROMETHAZINE HCL 25 MG/ML IJ SOLN: 6.2500 mg | INTRAMUSCULAR | Status: DC | PRN

## 2019-08-09 MED ORDER — ONDANSETRON HCL 4 MG/2ML IJ SOLN
4.0000 mg | Freq: Three times a day (TID) | INTRAMUSCULAR | Status: DC
  Administered 2019-08-09 – 2019-08-10 (×2): 4 mg via INTRAVENOUS
  Filled 2019-08-09 (×3): qty 2

## 2019-08-09 MED ORDER — ARTIFICIAL TEARS OPHTHALMIC OINT
TOPICAL_OINTMENT | OPHTHALMIC | Status: AC
  Filled 2019-08-09: qty 3.5

## 2019-08-09 MED ORDER — FOLIC ACID 1 MG PO TABS
1.0000 mg | ORAL_TABLET | Freq: Every day | ORAL | Status: DC
  Administered 2019-08-09 – 2019-08-10 (×2): 1 mg via ORAL
  Filled 2019-08-09 (×2): qty 1

## 2019-08-09 MED ORDER — MIDAZOLAM HCL 2 MG/2ML IJ SOLN: 0.5000 mg | Freq: Once | INTRAMUSCULAR | Status: DC | PRN

## 2019-08-09 MED ORDER — SODIUM CHLORIDE 0.9 % IV SOLN: INTRAVENOUS | Status: DC

## 2019-08-09 MED ORDER — PROPOFOL 10 MG/ML IV BOLUS
INTRAVENOUS | Status: DC | PRN
  Administered 2019-08-09: 40 mg via INTRAVENOUS

## 2019-08-09 MED ORDER — KETAMINE HCL 50 MG/5ML IJ SOSY
PREFILLED_SYRINGE | INTRAMUSCULAR | Status: AC
  Filled 2019-08-09: qty 5

## 2019-08-09 MED ORDER — HYDROCODONE-ACETAMINOPHEN 7.5-325 MG PO TABS: 1.0000 | ORAL_TABLET | Freq: Once | ORAL | Status: DC | PRN

## 2019-08-09 MED ORDER — PANTOPRAZOLE SODIUM 40 MG PO TBEC
40.0000 mg | DELAYED_RELEASE_TABLET | Freq: Two times a day (BID) | ORAL | Status: DC
  Administered 2019-08-09 – 2019-08-10 (×2): 40 mg via ORAL
  Filled 2019-08-09 (×2): qty 1

## 2019-08-09 MED ORDER — MAGNESIUM SULFATE 2 GM/50ML IV SOLN
2.0000 g | Freq: Once | INTRAVENOUS | Status: AC
  Administered 2019-08-09: 2 g via INTRAVENOUS
  Filled 2019-08-09: qty 50

## 2019-08-09 MED ORDER — PHENYLEPHRINE 40 MCG/ML (10ML) SYRINGE FOR IV PUSH (FOR BLOOD PRESSURE SUPPORT)
PREFILLED_SYRINGE | INTRAVENOUS | Status: AC
  Filled 2019-08-09: qty 10

## 2019-08-09 MED ORDER — SUCCINYLCHOLINE CHLORIDE 20 MG/ML IJ SOLN
INTRAMUSCULAR | Status: DC | PRN
  Administered 2019-08-09: 100 mg via INTRAVENOUS

## 2019-08-09 NOTE — Anesthesia Procedure Notes (Signed)
Procedure Name: Intubation Date/Time: 08/09/2019 11:25 AM Performed by: Andree Elk, Amy A, CRNA Pre-anesthesia Checklist: Patient identified, Emergency Drugs available, Suction available and Patient being monitored Patient Re-evaluated:Patient Re-evaluated prior to induction Oxygen Delivery Method: Circle system utilized Preoxygenation: Pre-oxygenation with 100% oxygen Induction Type: IV induction, Rapid sequence and Cricoid Pressure applied Grade View: Grade I Tube size: 6.5 mm Number of attempts: 1 Airway Equipment and Method: Video-laryngoscopy Placement Confirmation: CO2 detector and ETT inserted through vocal cords under direct vision Secured at: 21 cm Tube secured with: Tape

## 2019-08-09 NOTE — Anesthesia Preprocedure Evaluation (Signed)
Anesthesia Evaluation  Patient identified by MRN, date of birth, ID bandGeneral Assessment Comment:Pt very difficult to get any salient history from  Per Chart  Etoh, ? Gastric outlet obstruction  Was able to tell me her name and the current year after many attempts   Reviewed: Allergy & Precautions, NPO status , Patient's Chart, lab work & pertinent test results  Airway Mallampati: II  TM Distance: >3 FB Neck ROM: Full    Dental no notable dental hx. (+) Poor Dentition, Missing   Pulmonary neg pulmonary ROS,    Pulmonary exam normal breath sounds clear to auscultation       Cardiovascular Exercise Tolerance: Poor hypertension, negative cardio ROS Normal cardiovascular examII Rhythm:Regular Rate:Normal     Neuro/Psych PSYCHIATRIC DISORDERS negative neurological ROS     GI/Hepatic negative GI ROS, Neg liver ROS,   Endo/Other  negative endocrine ROS  Renal/GU negative Renal ROS  negative genitourinary   Musculoskeletal negative musculoskeletal ROS (+)   Abdominal   Peds negative pediatric ROS (+)  Hematology negative hematology ROS (+) anemia ,   Anesthesia Other Findings   Reproductive/Obstetrics negative OB ROS                             Anesthesia Physical Anesthesia Plan  ASA: IV  Anesthesia Plan: General   Post-op Pain Management:    Induction: Intravenous  PONV Risk Score and Plan: 3 and Ondansetron and Treatment may vary due to age or medical condition  Airway Management Planned: Oral ETT  Additional Equipment:   Intra-op Plan:   Post-operative Plan: Extubation in OR  Informed Consent: I have reviewed the patients History and Physical, chart, labs and discussed the procedure including the risks, benefits and alternatives for the proposed anesthesia with the patient or authorized representative who has indicated his/her understanding and acceptance.     Dental  advisory given  Plan Discussed with: CRNA  Anesthesia Plan Comments: (Plan Full PPE use Due to possible air fluid levels seen on recent CT scan will use GETA Plan GETA D/W PT -WTP with same after Q&A D/w pt possibility of postprocedural ventilation as needed -voiced understanding and WTP- will attempt extubation if it can be done safely  )        Anesthesia Quick Evaluation

## 2019-08-09 NOTE — Interval H&P Note (Signed)
History and Physical Interval Note:  08/09/2019 11:10 AM  Judy Davis  has presented today for surgery, with the diagnosis of nausea and vomiting, weight loss, dysphagia, abnormal CT.  The various methods of treatment have been discussed with the patient and family. After consideration of risks, benefits and other options for treatment, the patient has consented to  Procedure(s) with comments: ESOPHAGOGASTRODUODENOSCOPY (EGD) WITH PROPOFOL (N/A) - possible dilation - per Dr. Oneida Alar - may need to be intubated as a surgical intervention.  The patient's history has been reviewed, patient examined, no change in status, stable for surgery.  I have reviewed the patient's chart and labs.  Questions were answered to the patient's satisfaction.     [employer]

## 2019-08-09 NOTE — Transfer of Care (Signed)
Immediate Anesthesia Transfer of Care Note  Patient: Judy Davis  Procedure(s) Performed: ESOPHAGOGASTRODUODENOSCOPY (EGD) WITH PROPOFOL (N/A )  Patient Location: PACU  Anesthesia Type:General  Level of Consciousness: awake, alert  and patient cooperative  Airway & Oxygen Therapy: Patient Spontanous Breathing  Post-op Assessment: Report given to RN and Post -op Vital signs reviewed and stable  Post vital signs: Reviewed and stable  Last Vitals:  Vitals Value Taken Time  BP 132/80 08/09/19 1150  Temp    Pulse 93 08/09/19 1152  Resp 12 08/09/19 1152  SpO2 100 % 08/09/19 1152  Vitals shown include unvalidated device data.  Last Pain:  Vitals:   08/09/19 1150  TempSrc:   PainSc: (P) 0-No pain      Patients Stated Pain Goal: 8 ([id number])  Complications: No apparent anesthesia complications

## 2019-08-09 NOTE — Progress Notes (Signed)
PROGRESS NOTE  Judy Davis NWG:[id number] DOB: [birth year] DOA: 08/07/2019 PCP: [facility], [facility]  Brief History:  63 year old female with a history of hypertension and alcohol abuse presenting with nausea, vomiting, and generalized weakness for 3 to 4 days.  The patient was also complaining of some intermittent abdominal pain.  Unfortunately, the patient is a very difficult and challenging historian.  Attempts were made to also discuss with the patient's spouse, and he is also a very challenging historian.  Nevertheless, the patient presented to the emergency department on 08/03/2019 with similar symptoms.  She was treated symptomatically and discharged home in stable condition.  Unfortunately, she returns with similar symptoms.  She denied any fevers, chills, chest pain, shortness of breath, diarrhea.  It is unclear exactly how much she drinks each day, but she continues to drink beer on a daily basis. In the emergency department, the patient was afebrile hemodynamically stable with oxygen saturation 100% room air.  BMP shows sodium 132, potassium 2.8.  LFTs were essentially unremarkable.  WBC 5.6, hemoglobin 9.8, platelets 247,000.  CT of the abdomen and pelvis showed a distended stomach with air-fluid levels.  Otherwise, the CT of the abdomen/pelvis was unremarkable.  The patient had difficulty tolerating liquids.  As result, patient was admitted for further evaluation and treatment.  Assessment/Plan: Intractable nausea and vomiting -Due to alcoholic gastritis/esophagitis -increase PPI to BID -appreciate GI follow up -1124 EGD-->grade C esophagitis, edema/erythema gastric antrum -Clear liquid diet>>>dys 3 diet -Continue antiemetics -Continue IV fluids>>>saline lock -personally reviewed EKG--sinus, nonspecific T wave changes  Alcohol abuse/alcoholic ketosis -Ketonuria was noted in the UA -Alcohol withdrawal protocol--no signs of withdrawal -Urine drug  screen--neg  Essential hypertension -Patient is not on any agents in the outpatient setting -Monitor blood pressures off antihypertensive agents  Normocytic anemia -Serum B12--391 -iron saturation 58%, ferritin 696 -Folic acid2.9--->replete  Hypokalemia/hypomagnesemia -Replete  Hyponatremia -Secondary to volume depletion in the setting of alcoholic liver disease  Deconditioning -PT eval-->SNF      Disposition Plan:   Home in 1-2 days  Family Communication:   Spouse updated bedside11/24  Consultants:  none  Code Status:  FULL   DVT Prophylaxis:  SCDs   Procedures: As Listed in Progress Note Above  Antibiotics: Ceftriaxone 08/07/19>>>     Subjective: Pt states n/v are better.  Denies f/c, cp, sob, abd pain.  No cough or hemoptysis.  No diarrhea, hematochezia, melena  Objective: Vitals:   08/09/19 0956 08/09/19 1150 08/09/19 1200 08/09/19 1300  BP: (!) 150/97 132/80 (!) 146/83 (!) 156/101  Pulse: 93 93 87 85  Resp: 19 13 12 18   Temp: 98.1 F (36.7 C) 97.9 F (36.6 C)  (!) 97.5 F (36.4 C)  TempSrc: Oral   Oral  SpO2: 100% 100% 100% 99%  Weight:      Height:        Intake/Output Summary (Last 24 hours) at 08/09/2019 1808 Last data filed at 08/09/2019 1522 Gross per 24 hour  Intake 577.56 ml  Output 650 ml  Net -72.44 ml   Weight change:  Exam:   General:  Pt is alert, follows commands appropriately, not in acute distress  HEENT: No icterus, No thrush, No neck mass, Petros/AT  Cardiovascular: RRR, S1/S2, no rubs, no gallops  Respiratory: diminished BS, bibasilar rales. No wheeze  Abdomen: Soft/+BS, non tender, non distended, no guarding  Extremities: No edema, No lymphangitis, No petechiae, No rashes, no synovitis   Data  Reviewed: I have personally reviewed following labs and imaging studies Basic Metabolic Panel: Recent Labs  Lab 08/03/19 0853 08/03/19 1309 08/07/19 1421 08/08/19 0654 08/09/19 0636  NA 131*  --   132* 133* 136  K 2.6* 3.4* 2.8* 3.9 4.3  CL 87*  --  92* 101 107  CO2 23  --  21* 22 22  GLUCOSE 126*  --  113* 107* 124*  BUN 13  --  13 10 5*  CREATININE 0.63  --  0.71 0.57 0.50  CALCIUM 10.1  --  10.1 9.1 9.0  MG  --   --  1.6* 2.2 1.8   Liver Function Tests: Recent Labs  Lab 08/03/19 0853 08/07/19 1421 08/09/19 0636  AST 38 39 30  ALT 21 22 16   ALKPHOS 43 45 35*  BILITOT 2.1* 2.4* 1.1  PROT 8.9* 8.9* 6.8  ALBUMIN 4.3 4.3 3.3*   Recent Labs  Lab 08/03/19 0853 08/07/19 1421  LIPASE 20 19   No results for input(s): AMMONIA in the last 168 hours. Coagulation Profile: No results for input(s): INR, PROTIME in the last 168 hours. CBC: Recent Labs  Lab 08/03/19 0853 08/07/19 1421  WBC 5.8 5.6  NEUTROABS 3.8 3.4  HGB 9.9* 9.8*  HCT 30.0* 29.9*  MCV 95.5 94.6  PLT 322 347   Cardiac Enzymes: No results for input(s): CKTOTAL, CKMB, CKMBINDEX, TROPONINI in the last 168 hours. BNP: Invalid input(s): POCBNP CBG: Recent Labs  Lab 08/07/19 1422  GLUCAP 116*   HbA1C: No results for input(s): HGBA1C in the last 72 hours. Urine analysis:    Component Value Date/Time   COLORURINE AMBER (A) 08/07/2019 1347   APPEARANCEUR CLOUDY (A) 08/07/2019 1347   LABSPEC 1.013 08/07/2019 1347   PHURINE 9.0 (H) 08/07/2019 1347   GLUCOSEU NEGATIVE 08/07/2019 1347   HGBUR MODERATE (A) 08/07/2019 1347   BILIRUBINUR NEGATIVE 08/07/2019 1347   KETONESUR 20 (A) 08/07/2019 1347   PROTEINUR 100 (A) 08/07/2019 1347   UROBILINOGEN 0.2 05/10/2015 1000   NITRITE NEGATIVE 08/07/2019 1347   LEUKOCYTESUR NEGATIVE 08/07/2019 1347   Sepsis Labs: @LABRCNTIP (procalcitonin:4,lacticidven:4) ) Recent Results (from the past 240 hour(s))  Culture, Urine     Status: Abnormal   Collection Time: 08/07/19  1:47 PM   Specimen: Urine, Clean Catch  Result Value Ref Range Status   Specimen Description   Final    URINE, CLEAN CATCH Performed at [facility], [address], [place], [place]  [address]    Special Requests   Final    NONE Performed at [facility], [address] [place], [place] [address]    Culture (A)  Final    80,000 COLONIES/mL GROUP B STREP(S.AGALACTIAE)ISOLATED TESTING AGAINST S. AGALACTIAE NOT ROUTINELY PERFORMED DUE TO PREDICTABILITY OF AMP/PEN/VAN SUSCEPTIBILITY. Performed at [facility], [address], [place], [place] [address]    Report Status 08/09/2019 FINAL  Final  SARS CORONAVIRUS 2 (Menno Vanbergen 6-24 HRS) Nasopharyngeal Nasopharyngeal Swab     Status: None   Collection Time: 08/07/19  6:56 PM   Specimen: Nasopharyngeal Swab  Result Value Ref Range Status   SARS Coronavirus 2 NEGATIVE NEGATIVE Final    Comment: (NOTE) SARS-CoV-2 target nucleic acids are NOT DETECTED. The SARS-CoV-2 RNA is generally detectable in upper and lower respiratory specimens during the acute phase of infection. Negative results do not preclude SARS-CoV-2 infection, do not rule out co-infections with other pathogens, and should not be used as the sole basis for treatment or other patient management decisions.  Negative results must be combined with clinical observations, patient history, and epidemiological information. The expected result is Negative. Fact Sheet for Patients: HairSlick.no Fact Sheet for Healthcare Providers: quierodirigir.com This test is not yet approved or cleared by the [place] FDA and  has been authorized for detection and/or diagnosis of SARS-CoV-2 by FDA under an Emergency Use Authorization (EUA). This EUA will remain  in effect (meaning this test can be used) for the duration of the COVID-19 declaration under Section 56 4(b)(1) of the Act, 21 U.S.C. section 360bbb-3(b)(1), unless the authorization is terminated or revoked sooner. Performed at [facility], [address] [place], [place] [address]      Scheduled Meds:  folic acid  1 mg Oral Daily   ondansetron  4 mg  Intravenous Once   ondansetron (ZOFRAN) IV  4 mg Intravenous TID AC   pantoprazole  40 mg Oral BID AC   thiamine  100 mg Intravenous Daily   Continuous Infusions:  Procedures/Studies: Ct Abdomen Pelvis Wo Contrast  Result Date: 08/07/2019 CLINICAL DATA:  Acute abdominal pain. Vomiting and diarrhea. EXAM: CT ABDOMEN AND PELVIS WITHOUT CONTRAST TECHNIQUE: Multidetector CT imaging of the abdomen and pelvis was performed following the standard protocol without IV contrast. COMPARISON:  CT dated September 29, 2016. FINDINGS: Lower chest: The lung bases are clear. The heart size is normal. Hepatobiliary: There is a 3.4 cm cyst in the right hepatic lobe. Normal gallbladder.There is no biliary ductal dilation. Pancreas: There are multiple calcifications throughout the pancreas. These can be seen with chronic pancreatitis. Spleen: No splenic laceration or hematoma. Adrenals/Urinary Tract: --Adrenal glands: No adrenal hemorrhage. --Right kidney/ureter: The right kidney is malrotated with no evidence for right-sided hydronephrosis or radiopaque obstructing kidney stone --Left kidney/ureter: The left kidney is somewhat malrotated without evidence for hydronephrosis or radiopaque stone. --Urinary bladder: Unremarkable. Stomach/Bowel: --Stomach/Duodenum: The stomach is distended. An air-fluid level is noted. --Small bowel: No dilatation or inflammation. --Colon: No focal abnormality. --Appendix: Not visualized. No right lower quadrant inflammation or free fluid. Vascular/Lymphatic: Atherosclerotic calcification is present within the non-aneurysmal abdominal aorta, without hemodynamically significant stenosis. --No retroperitoneal lymphadenopathy. --No mesenteric lymphadenopathy. --No pelvic or inguinal lymphadenopathy. Reproductive: Status post hysterectomy. No adnexal mass. Other: No ascites or free air. The abdominal wall is normal. Musculoskeletal. No acute displaced fractures. IMPRESSION: 1. The stomach is  distended with air-fluid level. Findings may be secondary to gastroparesis or outlet obstruction. 2. Multiple calcifications throughout the pancreas. These can be seen with chronic pancreatitis. Aortic Atherosclerosis (ICD10-I70.0). Electronically Signed   By: Katherine Mantle M.D.   On: 08/07/2019 18:05    Catarina Hartshorn, DO  [facility] Pager [phone]  If 7PM-7AM, please contact night-coverage [email or web address] Password [email or web address] 08/09/2019, 6:08 PM   LOS: 1 day

## 2019-08-09 NOTE — Op Note (Signed)
[facility] Patient Name: Judy Davis Procedure Date: 08/09/2019 11:02 AM MRN: [id number] Date of Birth: 06/06/1956 Attending MD: Barney Drain MD, MD CSN: [id number] Age: 63 Admit Type: Inpatient Procedure:                Upper GI endoscopy WITH COLD FORCEPS BIOPSY Indications:              Abnormal CT of the GI tract, Nausea with vomiting Providers:                Barney Drain MD, MD, Hinton Rao, RN, Randa Spike, Technician Referring MD:             Norwood Levo. Moshe Cipro MD, MD Medicines:                General Anesthesia Complications:            No immediate complications. Estimated Blood Loss:     Estimated blood loss: none. Procedure:                Pre-Anesthesia Assessment:                           - Prior to the procedure, a History and Physical                            was performed, and patient medications and                            allergies were reviewed. The patient's tolerance of                            previous anesthesia was also reviewed. The risks                            and benefits of the procedure and the sedation                            options and risks were discussed with the patient.                            All questions were answered, and informed consent                            was obtained. Prior Anticoagulants: The patient has                            taken no previous anticoagulant or antiplatelet                            agents except for NSAID medication. ASA Grade                            Assessment: II - A patient with mild systemic  disease. After reviewing the risks and benefits,                            the patient was deemed in satisfactory condition to                            undergo the procedure. After obtaining informed                            consent, the endoscope was passed under direct                            vision. Throughout the  procedure, the patient's                            blood pressure, pulse, and oxygen saturations were                            monitored continuously. The [id number]                            scope was introduced through the mouth, and                            advanced to the second part of duodenum. The upper                            GI endoscopy was accomplished without difficulty.                            The patient tolerated the procedure well. Scope In: 11:32:27 AM Scope Out: 11:38:39 AM Total Procedure Duration: 0 hours 6 minutes 12 seconds  Findings:      LA Grade C (one or more mucosal breaks continuous between tops of 2 or       more mucosal folds, less than 75% circumference) esophagitis with no       bleeding was found 25 to 35 cm from the incisors.      Localized mild inflammation characterized by congestion (edema) and       erythema was found in the gastric antrum.       Biopsies(2;BODY,1:NCISURA,2:ANTRUM) were taken with a cold forceps for       Helicobacter pylori testing.      The examined duodenum was normal. Impression:               - NAUSEA AND VOMITING DUE TO Reflux                            esophagitis/GASTRITIS Moderate Sedation:      Per Anesthesia Care Recommendation:           - Return patient to hospital ward for ongoing care.                           - Continue present medications.                           -  Soft diet and low fat diet.                           - Await pathology results.                           - Return to GI office in 4 months. Procedure Code(s):        --- Professional ---                           [id number], Esophagogastroduodenoscopy, flexible,                            transoral; with biopsy, single or multiple Diagnosis Code(s):        --- Professional ---                           K21.00, Gastro-esophageal reflux disease with                            esophagitis, without bleeding                            K29.70, Gastritis, unspecified, without bleeding                           R11.2, Nausea with vomiting, unspecified                           R93.3, Abnormal findings on diagnostic imaging of                            other parts of digestive tract CPT copyright 2019 American Medical Association. All rights reserved. The codes documented in this report are preliminary and upon coder review may  be revised to meet current compliance requirements. Barney Drain, MD Barney Drain MD, MD 08/09/2019 12:56:07 PM This report has been signed electronically. Number of Addenda: 0

## 2019-08-09 NOTE — Evaluation (Signed)
Physical Therapy Evaluation Patient Details Name: Judy Davis MRN: [id number] DOB: December 25, 1955 Today's Date: 08/09/2019   History of Present Illness  Judy Davis is a 63 y.o. female with medical history significant for hypertension and alcohol abuse.  Patient was brought to the ED with complaints of vomiting and generalized weakness over the past several days.  History is obtained from chart review.  On my evaluation, although patient is awake and alert, except from telling me she would like to go home, she keeps nodding her head yes to all my questions.  Patient was in the ED 4 days 11/18 with same complaints.  Blood work revealed hypokalemia, which was repleted in the ED, patient was able to eat and ambulate in the ED and was sent home.  With return precautions.  Since discharge symptoms of vomiting and weakness have persisted, with abdominal pain.    Clinical Impression  Patient demonstrates slow labored movement for sitting up at bedside, unable to maintain standing balance without support and required use of RW for safety to transfer to chair.  Patient unable to ambulate away from bedside due to c/o fatigue and tolerated sitting up in chair after therapy with her spouse present in room - RN aware.  Patient will benefit from continued physical therapy in hospital and recommended venue below to increase strength, balance, endurance for safe ADLs and gait.    Follow Up Recommendations SNF;Supervision for mobility/OOB;Supervision - Intermittent    Equipment Recommendations  Rolling walker with 5" wheels;3in1 (PT)    Recommendations for Other Services       Precautions / Restrictions Precautions Precautions: Fall Restrictions Weight Bearing Restrictions: No      Mobility  Bed Mobility Overal bed mobility: Needs Assistance Bed Mobility: Supine to Sit     Supine to sit: Min assist     General bed mobility comments: slow labored movement  Transfers Overall transfer  level: Needs assistance Equipment used: Rolling walker (2 wheeled);None Transfers: Sit to/from [facility] Sit to Stand: Mod assist Stand pivot transfers: Mod assist       General transfer comment: very unsteady on feet, unable to stand without AD, required use of RW for safety  Ambulation/Gait Ambulation/Gait assistance: Mod assist;Max assist Gait Distance (Feet): 5 Feet Assistive device: Rolling walker (2 wheeled) Gait Pattern/deviations: Decreased step length - right;Decreased step length - left;Decreased stride length Gait velocity: slow   General Gait Details: limited to 5-6 very slow unsteady steps at bedside due to poor standing balance and fatigue  Stairs            Wheelchair Mobility    Modified Rankin (Stroke Patients Only)       Balance Overall balance assessment: Needs assistance Sitting-balance support: Feet supported;No upper extremity supported Sitting balance-Leahy Scale: Fair Sitting balance - Comments: seated at EOB   Standing balance support: During functional activity;No upper extremity supported Standing balance-Leahy Scale: Poor Standing balance comment: fair/poor using RW                             Pertinent Vitals/Pain Pain Assessment: No/denies pain    Home Living Family/patient expects to be discharged to:: Private residence Living Arrangements: Spouse/significant other Available Help at Discharge: Family;Available 24 hours/day Type of Home: House Home Access: Stairs to enter Entrance Stairs-Rails: Right Entrance Stairs-Number of Steps: 2 Home Layout: One level;Laundry or work area in basement(Patient states she does not have to go to basement) Home  Equipment: Kasandra Knudsen - single point      Prior Function Level of Independence: Independent         Comments: Household and short distanced community ambulator without AD     Hand Dominance        Extremity/Trunk Assessment   Upper Extremity  Assessment Upper Extremity Assessment: Generalized weakness    Lower Extremity Assessment Lower Extremity Assessment: Generalized weakness    Cervical / Trunk Assessment Cervical / Trunk Assessment: Kyphotic  Communication   Communication: No difficulties  Cognition Arousal/Alertness: Awake/alert Behavior During Therapy: WFL for tasks assessed/performed Overall Cognitive Status: Within Functional Limits for tasks assessed                                        General Comments      Exercises     Assessment/Plan    PT Assessment Patient needs continued PT services  PT Problem List Decreased strength;Decreased activity tolerance;Decreased balance;Decreased mobility       PT Treatment Interventions Gait training;Balance training;Stair training;Functional mobility training;Therapeutic activities;Manual techniques;Patient/family education    PT Goals (Current goals can be found in the Care Plan section)  Acute Rehab PT Goals Patient Stated Goal: return home with family to assist PT Goal Formulation: With patient/family Time For Goal Achievement: 08/23/19 Potential to Achieve Goals: Good    Frequency Min 3X/week   Barriers to discharge        Co-evaluation               AM-PAC PT "6 Clicks" Mobility  Outcome Measure Help needed turning from your back to your side while in a flat bed without using bedrails?: A Little Help needed moving from lying on your back to sitting on the side of a flat bed without using bedrails?: A Lot Help needed moving to and from a bed to a chair (including a wheelchair)?: A Lot Help needed standing up from a chair using your arms (e.g., wheelchair or bedside chair)?: A Lot Help needed to walk in hospital room?: A Lot Help needed climbing 3-5 steps with a railing? : A Lot 6 Click Score: 13    End of Session Equipment Utilized During Treatment: Gait belt Activity Tolerance: Patient tolerated treatment well;Patient  limited by fatigue Patient left: in chair;with call bell/phone within reach;with family/visitor present Nurse Communication: Mobility status PT Visit Diagnosis: Unsteadiness on feet (R26.81);Other abnormalities of gait and mobility (R26.89);Muscle weakness (generalized) (M62.81)    Time: [id number] PT Time Calculation (min) (ACUTE ONLY): 27 min   Charges:   PT Evaluation $PT Eval Moderate Complexity: 1 Mod PT Treatments $Therapeutic Activity: 23-37 mins        3:11 PM, 08/09/19 Lonell Grandchild, MPT Physical Therapist with [facility] [phone] office [phone] mobile phone

## 2019-08-09 NOTE — TOC Initial Note (Signed)
Transition of Care [facility]) - Initial/Assessment Note    Patient Details  Name: Judy Davis MRN: [id number] Date of Birth: 1956-01-13  Transition of [facility]) CM/SW Contact:    Ihor Gully, LCSW Phone Number: 08/09/2019, 4:57 PM  Clinical Narrative:                 Call made to spouse, no answer, no machine. Patient is oriented to self only. Sister, Laicey Scites, provided history. Patient from home with spouse. Patient admitted for intractable vomiting. Hx. Of Etoh abuse. At baseline she is independent with ambulation and ADLs. Over the past two weeks, spouse has been helping patient ambulate from one room to the next due to patient being sick. Sister, stated that patient can "drink a lil' heavy" every now and then.  PT evaluation and recommendations discussed with Patsy. Patsy states that patient may need SNF but she is doubtful that patient will want to go as she is a homebody. She stated that [facility] would have to speak with patient about discharge plan. Explained that patient was currently oriented to self. TOC will continue to try and reach spouse and discuss discharge plan.    Expected Discharge Plan: Skilled Nursing Facility Barriers to Discharge: Continued Medical Work up   Patient Goals and CMS Choice     Choice offered to / list presented to : Sibling  Expected Discharge Plan and Services Expected Discharge Plan: [place]       Living arrangements for the past 2 months: Single Family Home                                      Prior Living Arrangements/Services Living arrangements for the past 2 months: Single Family Home Lives with:: Spouse Patient language and need for interpreter reviewed:: Yes Do you feel safe going back to the place where you live?: Yes      Need for Family Participation in Patient Care: Yes (Comment) Care giver support system in place?: Yes (comment)   Criminal Activity/Legal Involvement Pertinent to Current  Situation/Hospitalization: No - Comment as needed  Activities of Daily Living Home Assistive Devices/Equipment: None ADL Screening (condition at time of admission) Patient's cognitive ability adequate to safely complete daily activities?: Yes Is the patient deaf or have difficulty hearing?: No Does the patient have difficulty seeing, even when wearing glasses/contacts?: No Does the patient have difficulty concentrating, remembering, or making decisions?: No Patient able to express need for assistance with ADLs?: Yes Does the patient have difficulty dressing or bathing?: Yes Independently performs ADLs?: No Communication: Independent Dressing (OT): Needs assistance Is this a change from baseline?: Pre-admission baseline Grooming: Needs assistance Is this a change from baseline?: Pre-admission baseline Feeding: Independent Bathing: Needs assistance Is this a change from baseline?: Pre-admission baseline Toileting: Needs assistance Is this a change from baseline?: Pre-admission baseline In/Out Bed: Needs assistance Is this a change from baseline?: Pre-admission baseline Walks in Home: Independent Does the patient have difficulty walking or climbing stairs?: No Weakness of Legs: None Weakness of Arms/Hands: None  Permission Sought/Granted Permission sought to share information with : Family Supports          Permission granted to share info w Relationship: Sister, Stefany Mendiola     Emotional Assessment Appearance:: Appears stated age   Affect (typically observed): Unable to Assess Orientation: : Oriented to Self Alcohol / Substance Use: Alcohol Use Psych  Involvement: No (comment)  Admission diagnosis:  failure to thrive Patient Active Problem List   Diagnosis Date Noted  . Alcohol abuse 08/08/2019  . Intractable nausea and vomiting 08/08/2019  . Intractable vomiting 08/07/2019  . Hematoma 09/29/2016  . Elevated AST (SGOT) 09/29/2016  . Hyperglycemia 09/29/2016  .  Hypokalemia 09/29/2016  . Upper respiratory infection 07/06/2011  . UTI (lower urinary tract infection) 07/06/2011  . Nausea and vomiting 07/05/2011  . Hyponatremia 07/05/2011  . HTN (hypertension) 07/05/2011  . Anemia 07/05/2011   PCP:  [facility], [place]:   [facility] [address] [place] [id number] [place] [address] [place] [address] Phone: [phone] Fax: [phone]  [facility] [phone] - [place], [place] AT [place] [id number] [place] [address] Phone: [phone] Fax: [phone]     Social Determinants of Health (SDOH) Interventions    Readmission Risk Interventions No flowsheet data found.

## 2019-08-09 NOTE — Plan of Care (Signed)
  Problem: Acute Rehab PT Goals(only PT should resolve) Goal: Pt Will Go Supine/Side To Sit Outcome: Progressing Flowsheets (Taken 08/09/2019 1515) Pt will go Supine/Side to Sit:  with supervision  with min guard assist Goal: Patient Will Transfer Sit To/From Stand Outcome: Progressing Flowsheets (Taken 08/09/2019 1515) Patient will transfer sit to/from stand:  with min guard assist  with minimal assist Goal: Pt Will Transfer Bed To Chair/Chair To Bed Outcome: Progressing Flowsheets (Taken 08/09/2019 1515) Pt will Transfer Bed to Chair/Chair to Bed: with min assist Goal: Pt Will Ambulate Outcome: Progressing Flowsheets (Taken 08/09/2019 1515) Pt will Ambulate:  50 feet  with minimal assist  with rolling walker   3:15 PM, 08/09/19 Lonell Grandchild, MPT Physical Therapist with [facility] [phone] office [phone] mobile phone

## 2019-08-10 ENCOUNTER — Other Ambulatory Visit: Payer: Self-pay

## 2019-08-10 DIAGNOSIS — R2681 Unsteadiness on feet: Secondary | ICD-10-CM

## 2019-08-10 LAB — MAGNESIUM: Magnesium: 1.8 mg/dL (ref 1.7–2.4)

## 2019-08-10 LAB — BASIC METABOLIC PANEL
Anion gap: 9 (ref 5–15)
BUN: <5 mg/dL — ABNORMAL LOW (ref 8–23)
CO2: 24 mmol/L (ref 22–32)
Calcium: 9.2 mg/dL (ref 8.9–10.3)
Chloride: 98 mmol/L (ref 98–111)
Creatinine, Ser: 0.55 mg/dL (ref 0.44–1.00)
GFR calc Af Amer: >60 mL/min (ref >60)
GFR calc non Af Amer: >60 mL/min (ref >60)
Glucose, Bld: 95 mg/dL (ref 70–99)
Potassium: 3.8 mmol/L (ref 3.5–5.1)
Sodium: 131 mmol/L — ABNORMAL LOW (ref 135–145)

## 2019-08-10 LAB — SURGICAL PATHOLOGY

## 2019-08-10 MED ORDER — PANTOPRAZOLE SODIUM 40 MG PO TBEC: 40.0000 mg | DELAYED_RELEASE_TABLET | Freq: Two times a day (BID) | ORAL | 1 refills | Status: DC

## 2019-08-10 NOTE — TOC Progression Note (Signed)
Transition of Care [facility]) - Progression Note    Patient Details  Name: Judy Davis MRN: [id number] Date of Birth: 12/04/1955  Transition of Care [facility]) CM/SW Contact  Ihor Gully, LCSW Phone Number: 08/10/2019, 11:43 AM  Clinical Narrative:    Met with patient and spouse who was at bedside. Discussed PT evaluation/recommendation. Patient and spouse are not interested in SNF. Accepting of HH. Medicare [place] choices were provided from website.  Referral made to [facility]. Patient's PCP is [facility]. They do not sign HH orders. Dr. Rockne Menghini has agreed to sign HH orders.  TOC signing off.    Expected Discharge Plan: [place] Barriers to Discharge: Continued Medical Work up  Expected Discharge Plan and Services Expected Discharge Plan: [place] arrangements for the past 2 months: [place]: PT [place]: [place] ([place]) Date [place]: 08/10/19 Time [place]: 1143 Representative spoke with at [place]: Romualdo Bolk   Social Determinants of Health ([place]) Interventions    Readmission Risk Interventions No flowsheet data found.

## 2019-08-10 NOTE — Discharge Summary (Signed)
Physician Discharge Summary  Judy Davis [id number] DOB: 1956/02/21 DOA: 08/07/2019  PCP: Randell Patient [facility] Public  Admit date: 08/07/2019 Discharge date: 08/10/2019  Admitted From: Home Disposition:  Home   Recommendations for Outpatient Follow-up:  1. Follow up with PCP in 1-2 weeks 2. Please obtain BMP/CBC in one week   Home Health: HHPT--refused SNF   Discharge Condition: Stable CODE STATUS: FULL Diet recommendation:soft   Brief/Interim Summary: 62 year old female with a history of hypertension and alcohol abuse presenting with nausea, vomiting, and generalized weakness for 3 to 4 days. The patient was also complaining of some intermittent abdominal pain. Unfortunately, the patient is a very difficult and challenging historian. Attempts were made to also discuss with the patient's spouse, and he is also a very challenging historian. Nevertheless, the patient presented to the emergency department on 08/03/2019 with similar symptoms. She was treated symptomatically and discharged home in stable condition. Unfortunately, she returns with similar symptoms. She denied any fevers, chills, chest pain, shortness of breath, diarrhea. It is unclear exactly how much she drinks each day, but she continues to drink beer on a daily basis. In the emergency department, the patient was afebrile hemodynamically stable with oxygen saturation 100% room air. BMP shows sodium 132, potassium 2.8. LFTs were essentially unremarkable. WBC 5.6, hemoglobin 9.8, platelets 247,000. CT of the abdomen and pelvis showed a distended stomach with air-fluid levels. Otherwise, the CT of the abdomen/pelvis was unremarkable. The patient had difficulty tolerating liquids. As result, patient was admitted for further evaluation and treatment.  GI was consulted to assist with management.  Discharge Diagnoses:  Intractable nausea and vomiting -Due to alcoholic gastritis/esophagitis -increase  PPI to BID x 1 month, then daily forever -appreciate GI follow up -08/09/19 EGD-->grade C esophagitis, edema/erythema gastric antrum -Clear liquid diet>>>dys 3 diet which pt tolerated -Continue antiemetics -Continue IV fluids>>>saline lock -personally reviewed EKG--sinus, nonspecific T wave changes  Alcohol abuse/alcoholic ketosis -Ketonuria was noted in the UA -Alcohol withdrawal protocol--no signs of withdrawal -Urine drug screen--neg -improved with IVF  Essential hypertension -Patient is not on any agents in the outpatient setting -Monitor blood pressures off antihypertensive agents  Normocytic anemia -Serum B12--391 -iron saturation 58%, ferritin 696 -Folic acid2.9--->replete  Hypokalemia/hypomagnesemia -Replete  Hyponatremia -Secondary to volume depletion in the setting of alcoholic liver disease  Deconditioning -PT eval-->SNF -pt refused SNF -set up HHPT   Discharge Instructions   Allergies as of 08/10/2019   No Known Allergies     Medication List    STOP taking these medications   meloxicam 7.5 MG tablet Commonly known as: Mobic     TAKE these medications   ondansetron 4 MG disintegrating tablet Commonly known as: Zofran ODT Take 1 tablet (4 mg total) by mouth every 8 (eight) hours as needed for nausea or vomiting.   pantoprazole 40 MG tablet Commonly known as: PROTONIX Take 1 tablet (40 mg total) by mouth 2 (two) times daily before a meal.       No Known Allergies  Consultations:  GI   Procedures/Studies: Ct Abdomen Pelvis Wo Contrast  Result Date: 08/07/2019 CLINICAL DATA:  Acute abdominal pain. Vomiting and diarrhea. EXAM: CT ABDOMEN AND PELVIS WITHOUT CONTRAST TECHNIQUE: Multidetector CT imaging of the abdomen and pelvis was performed following the standard protocol without IV contrast. COMPARISON:  CT dated September 29, 2016. FINDINGS: Lower chest: The lung bases are clear. The heart size is normal. Hepatobiliary: There is a 3.4  cm cyst in the right hepatic lobe. Normal gallbladder.There is  no biliary ductal dilation. Pancreas: There are multiple calcifications throughout the pancreas. These can be seen with chronic pancreatitis. Spleen: No splenic laceration or hematoma. Adrenals/Urinary Tract: --Adrenal glands: No adrenal hemorrhage. --Right kidney/ureter: The right kidney is malrotated with no evidence for right-sided hydronephrosis or radiopaque obstructing kidney stone --Left kidney/ureter: The left kidney is somewhat malrotated without evidence for hydronephrosis or radiopaque stone. --Urinary bladder: Unremarkable. Stomach/Bowel: --Stomach/Duodenum: The stomach is distended. An air-fluid level is noted. --Small bowel: No dilatation or inflammation. --Colon: No focal abnormality. --Appendix: Not visualized. No right lower quadrant inflammation or free fluid. Vascular/Lymphatic: Atherosclerotic calcification is present within the non-aneurysmal abdominal aorta, without hemodynamically significant stenosis. --No retroperitoneal lymphadenopathy. --No mesenteric lymphadenopathy. --No pelvic or inguinal lymphadenopathy. Reproductive: Status post hysterectomy. No adnexal mass. Other: No ascites or free air. The abdominal wall is normal. Musculoskeletal. No acute displaced fractures. IMPRESSION: 1. The stomach is distended with air-fluid level. Findings may be secondary to gastroparesis or outlet obstruction. 2. Multiple calcifications throughout the pancreas. These can be seen with chronic pancreatitis. Aortic Atherosclerosis (ICD10-I70.0). Electronically Signed   By: Katherine Mantle M.D.   On: 08/07/2019 18:05         Discharge Exam: Vitals:   08/09/19 2133 08/10/19 0506  BP: (!) 137/97 113/74  Pulse: 91 92  Resp: 16 16  Temp: 97.8 F (36.6 C) 98.6 F (37 C)  SpO2: 100% 100%   Vitals:   08/09/19 1300 08/09/19 2125 08/09/19 2133 08/10/19 0506  BP: (!) 156/101 (!) 137/91 (!) 137/97 113/74  Pulse: 85 89 91 92  Resp:  18 16 16 16   Temp: (!) 97.5 F (36.4 C) 97.8 F (36.6 C) 97.8 F (36.6 C) 98.6 F (37 C)  TempSrc: Oral Oral Oral Oral  SpO2: 99% 100% 100% 100%  Weight:      Height:        General: Pt is alert, awake, not in acute distress Cardiovascular: RRR, S1/S2 +, no rubs, no gallops Respiratory: bibasilar rales no wheeze Abdominal: Soft, NT, ND, bowel sounds + Extremities: no edema, no cyanosis   The results of significant diagnostics from this hospitalization (including imaging, microbiology, ancillary and laboratory) are listed below for reference.    Significant Diagnostic Studies: Ct Abdomen Pelvis Wo Contrast  Result Date: 08/07/2019 CLINICAL DATA:  Acute abdominal pain. Vomiting and diarrhea. EXAM: CT ABDOMEN AND PELVIS WITHOUT CONTRAST TECHNIQUE: Multidetector CT imaging of the abdomen and pelvis was performed following the standard protocol without IV contrast. COMPARISON:  CT dated September 29, 2016. FINDINGS: Lower chest: The lung bases are clear. The heart size is normal. Hepatobiliary: There is a 3.4 cm cyst in the right hepatic lobe. Normal gallbladder.There is no biliary ductal dilation. Pancreas: There are multiple calcifications throughout the pancreas. These can be seen with chronic pancreatitis. Spleen: No splenic laceration or hematoma. Adrenals/Urinary Tract: --Adrenal glands: No adrenal hemorrhage. --Right kidney/ureter: The right kidney is malrotated with no evidence for right-sided hydronephrosis or radiopaque obstructing kidney stone --Left kidney/ureter: The left kidney is somewhat malrotated without evidence for hydronephrosis or radiopaque stone. --Urinary bladder: Unremarkable. Stomach/Bowel: --Stomach/Duodenum: The stomach is distended. An air-fluid level is noted. --Small bowel: No dilatation or inflammation. --Colon: No focal abnormality. --Appendix: Not visualized. No right lower quadrant inflammation or free fluid. Vascular/Lymphatic: Atherosclerotic calcification is  present within the non-aneurysmal abdominal aorta, without hemodynamically significant stenosis. --No retroperitoneal lymphadenopathy. --No mesenteric lymphadenopathy. --No pelvic or inguinal lymphadenopathy. Reproductive: Status post hysterectomy. No adnexal mass. Other: No ascites or free air. The  abdominal wall is normal. Musculoskeletal. No acute displaced fractures. IMPRESSION: 1. The stomach is distended with air-fluid level. Findings may be secondary to gastroparesis or outlet obstruction. 2. Multiple calcifications throughout the pancreas. These can be seen with chronic pancreatitis. Aortic Atherosclerosis (ICD10-I70.0). Electronically Signed   By: Katherine Mantle M.D.   On: 08/07/2019 18:05     Microbiology: Recent Results (from the past 240 hour(s))  Culture, Urine     Status: Abnormal   Collection Time: 08/07/19  1:47 PM   Specimen: Urine, Clean Catch  Result Value Ref Range Status   Specimen Description   Final    URINE, CLEAN CATCH Performed at [facility], [address], [place], [place] [address]    Special Requests   Final    NONE Performed at [facility], [address] [place], [place] [address]    Culture (A)  Final    80,000 COLONIES/mL GROUP B STREP(S.AGALACTIAE)ISOLATED TESTING AGAINST S. AGALACTIAE NOT ROUTINELY PERFORMED DUE TO PREDICTABILITY OF AMP/PEN/VAN SUSCEPTIBILITY. Performed at [facility], [address], [place], [place] [address]    Report Status 08/09/2019 FINAL  Final  SARS CORONAVIRUS 2 (Sanad Fearnow 6-24 HRS) Nasopharyngeal Nasopharyngeal Swab     Status: None   Collection Time: 08/07/19  6:56 PM   Specimen: Nasopharyngeal Swab  Result Value Ref Range Status   SARS Coronavirus 2 NEGATIVE NEGATIVE Final    Comment: (NOTE) SARS-CoV-2 target nucleic acids are NOT DETECTED. The SARS-CoV-2 RNA is generally detectable in upper and lower respiratory specimens during the acute phase of infection. Negative results do not preclude SARS-CoV-2 infection,  do not rule out co-infections with other pathogens, and should not be used as the sole basis for treatment or other patient management decisions. Negative results must be combined with clinical observations, patient history, and epidemiological information. The expected result is Negative. Fact Sheet for Patients: HairSlick.no Fact Sheet for Healthcare Providers: quierodirigir.com This test is not yet approved or cleared by the [place] FDA and  has been authorized for detection and/or diagnosis of SARS-CoV-2 by FDA under an Emergency Use Authorization (EUA). This EUA will remain  in effect (meaning this test can be used) for the duration of the COVID-19 declaration under Section 56 4(b)(1) of the Act, 21 U.S.C. section 360bbb-3(b)(1), unless the authorization is terminated or revoked sooner. Performed at [facility], [address] [place], [place] [address]      Labs: Basic Metabolic Panel: Recent Labs  Lab 08/07/19 1421 08/08/19 0654 08/09/19 0636 08/10/19 0518  NA 132* 133* 136 131*  K 2.8* 3.9 4.3 3.8  CL 92* 101 107 98  CO2 21* 22 22 24   GLUCOSE 113* 107* 124* 95  BUN 13 10 5* <5*  CREATININE 0.71 0.57 0.50 0.55  CALCIUM 10.1 9.1 9.0 9.2  MG 1.6* 2.2 1.8 1.8   Liver Function Tests: Recent Labs  Lab 08/07/19 1421 08/09/19 0636  AST 39 30  ALT 22 16  ALKPHOS 45 35*  BILITOT 2.4* 1.1  PROT 8.9* 6.8  ALBUMIN 4.3 3.3*   Recent Labs  Lab 08/07/19 1421  LIPASE 19   No results for input(s): AMMONIA in the last 168 hours. CBC: Recent Labs  Lab 08/07/19 1421  WBC 5.6  NEUTROABS 3.4  HGB 9.8*  HCT 29.9*  MCV 94.6  PLT 347   Cardiac Enzymes: No results for input(s): CKTOTAL, CKMB, CKMBINDEX, TROPONINI in the last 168 hours. BNP: Invalid input(s): POCBNP CBG: Recent Labs  Lab 08/07/19  1422  GLUCAP 116*    Time coordinating discharge:  36 minutes  Signed:  Catarina Hartshorn, DO  [facility] Pager: [phone] 08/10/2019, 11:44 AM

## 2019-08-15 ENCOUNTER — Encounter (HOSPITAL_COMMUNITY): Payer: Self-pay | Admitting: Gastroenterology

## 2019-08-15 NOTE — Anesthesia Postprocedure Evaluation (Signed)
Anesthesia Post Note  Patient: Judy Davis  Procedure(s) Performed: ESOPHAGOGASTRODUODENOSCOPY (EGD) WITH PROPOFOL (N/A )  Patient location during evaluation: PACU Anesthesia Type: General Level of consciousness: awake and awake and alert Pain management: pain level controlled Vital Signs Assessment: post-procedure vital signs reviewed and stable Respiratory status: spontaneous breathing and nonlabored ventilation Cardiovascular status: blood pressure returned to baseline Postop Assessment: no headache Anesthetic complications: no     Last Vitals:  Vitals:   08/09/19 2133 08/10/19 0506  BP: (!) 137/97 113/74  Pulse: 91 92  Resp: 16 16  Temp: 36.6 C 37 C  SpO2: 100% 100%    Last Pain:  Vitals:   08/10/19 0734  TempSrc:   PainSc: 0-No pain                 [place]bert [place] Mickala Laton

## 2019-08-18 ENCOUNTER — Encounter: Payer: Self-pay | Admitting: Gastroenterology

## 2019-08-18 ENCOUNTER — Telehealth: Payer: Self-pay | Admitting: Gastroenterology

## 2019-08-18 NOTE — Telephone Encounter (Signed)
Please call pt. HER stomach Bx shows gastritis DUE TO ETOH.   AVOID ETOH. CONTINUE PROTONIX. TAKE 30 MINUTES PRIOR TO MEALS TWICE DAILY FOR ONE MONTH THEN ONCE DAILY FOREVER. FOLLOW UP IN 4 MOS. PT NEEDS A SCREENING COLONOSCOPY.

## 2019-08-18 NOTE — Telephone Encounter (Signed)
Patient scheduled.

## 2019-08-18 NOTE — Telephone Encounter (Signed)
LMOM for a return call.  

## 2019-08-19 NOTE — Telephone Encounter (Signed)
Letter mailed to pt to call for results.

## 2019-12-20 ENCOUNTER — Encounter: Payer: Self-pay | Admitting: Gastroenterology

## 2019-12-20 ENCOUNTER — Ambulatory Visit: Payer: Medicaid Other | Admitting: Gastroenterology

## 2019-12-20 ENCOUNTER — Telehealth: Payer: Self-pay | Admitting: Gastroenterology

## 2019-12-20 NOTE — Progress Notes (Deleted)
      Primary Care Physician: [facility], [place]  Primary Gastroenterologist:  Barney Drain, MD   No chief complaint on file.   HPI: Judy Davis is a 64 y.o. female here for follow-up.  She was seen in the hospital back in November 2020 for nausea and vomiting, CT A/P without contrastsuggesting gastroparesis versus gastric outlet obstruction.  History of alcohol abuse.  Also noted to have multiple calcifications of the pancreas.  20 pound weight loss noted since 2019.  No prior colonoscopy.  Current Outpatient Medications  Medication Sig Dispense Refill  . ondansetron (ZOFRAN ODT) 4 MG disintegrating tablet Take 1 tablet (4 mg total) by mouth every 8 (eight) hours as needed for nausea or vomiting. 20 tablet 0  . pantoprazole (PROTONIX) 40 MG tablet Take 1 tablet (40 mg total) by mouth 2 (two) times daily before a meal. 60 tablet 1   No current facility-administered medications for this visit.    Allergies as of 12/20/2019  . (No Known Allergies)    ROS:  General: Negative for anorexia, weight loss, fever, chills, fatigue, weakness. ENT: Negative for hoarseness, difficulty swallowing , nasal congestion. CV: Negative for chest pain, angina, palpitations, dyspnea on exertion, peripheral edema.  Respiratory: Negative for dyspnea at rest, dyspnea on exertion, cough, sputum, wheezing.  GI: See history of present illness. GU:  Negative for dysuria, hematuria, urinary incontinence, urinary frequency, nocturnal urination.  Endo: Negative for unusual weight change.    Physical Examination:   There were no vitals taken for this visit.  General: Well-nourished, well-developed in no acute distress.  Eyes: No icterus. Mouth: Oropharyngeal mucosa moist and pink , no lesions erythema or exudate. Lungs: Clear to auscultation bilaterally.  Heart: Regular rate and rhythm, no murmurs rubs or gallops.  Abdomen: Bowel sounds are normal, nontender, nondistended, no  hepatosplenomegaly or masses, no abdominal bruits or hernia , no rebound or guarding.   Extremities: No lower extremity edema. No clubbing or deformities. Neuro: Alert and oriented x 4   Skin: Warm and dry, no jaundice.   Psych: Alert and cooperative, normal mood and affect.  Labs:  Lab Results  Component Value Date   CREATININE 0.55 08/10/2019   BUN <5 (L) 08/10/2019   NA 131 (L) 08/10/2019   K 3.8 08/10/2019   CL 98 08/10/2019   CO2 24 08/10/2019   Lab Results  Component Value Date   ALT 16 08/09/2019   AST 30 08/09/2019   ALKPHOS 35 (L) 08/09/2019   BILITOT 1.1 08/09/2019   Lab Results  Component Value Date   WBC 5.6 08/07/2019   HGB 9.8 (L) 08/07/2019   HCT 29.9 (L) 08/07/2019   MCV 94.6 08/07/2019   PLT 347 08/07/2019   Lab Results  Component Value Date   IRON 117 08/08/2019   TIBC 203 (L) 08/08/2019   FERRITIN 696 (H) 08/08/2019   Lab Results  Component Value Date   FOLATE 2.9 (L) 08/08/2019   Lab Results  Component Value Date   VITAMINB12 391 08/08/2019     Imaging Studies: No results found.

## 2019-12-20 NOTE — Telephone Encounter (Signed)
Patient was a no show and letter sent  °

## 2020-01-05 ENCOUNTER — Ambulatory Visit: Payer: Medicare Other | Attending: Internal Medicine

## 2020-01-05 DIAGNOSIS — Z23 Encounter for immunization: Secondary | ICD-10-CM

## 2020-01-05 NOTE — Progress Notes (Signed)
   Covid-19 Vaccination Clinic  Name:  CARIANNA LEVELL    MRN: [id number] DOB: Oct 09, 1955  01/05/2020  Ms. Miura was observed post Covid-19 immunization for 15 minutes without incident. She was provided with Vaccine Information Sheet and instruction to access the V-Safe system.   Ms. Traum was instructed to call 911 with any severe reactions post vaccine: Marland Kitchen Difficulty breathing  . Swelling of face and throat  . A fast heartbeat  . A bad rash all over body  . Dizziness and weakness   Immunizations Administered    Name Date Dose VIS Date Route   Moderna COVID-19 Vaccine 01/05/2020  8:29 AM 0.5 mL 08/2019 Intramuscular   Manufacturer: Moderna   Lot: [id number]   [place][id number]

## 2020-02-07 ENCOUNTER — Ambulatory Visit: Payer: Medicare Other | Attending: Internal Medicine

## 2020-02-07 ENCOUNTER — Ambulatory Visit: Payer: Medicare Other

## 2020-02-07 DIAGNOSIS — Z23 Encounter for immunization: Secondary | ICD-10-CM

## 2020-02-07 NOTE — Progress Notes (Signed)
   Covid-19 Vaccination Clinic  Name:  RAWDA BENALLY    MRN: [id number] DOB: [birth year]  02/07/2020  Ms. Mohs was observed post Covid-19 immunization for 15 minutes without incident. She was provided with Vaccine Information Sheet and instruction to access the V-Safe system.   Ms. Wojno was instructed to call 911 with any severe reactions post vaccine: Marland Kitchen Difficulty breathing  . Swelling of face and throat  . A fast heartbeat  . A bad rash all over body  . Dizziness and weakness   Immunizations Administered    Name Date Dose VIS Date Route   Moderna COVID-19 Vaccine 02/07/2020  8:55 AM 0.5 mL 08/2019 Intramuscular   Manufacturer: Moderna   Lot: [id number]   [id number]

## 2020-02-27 ENCOUNTER — Other Ambulatory Visit: Payer: Self-pay

## 2020-02-27 ENCOUNTER — Emergency Department (HOSPITAL_COMMUNITY): Payer: Medicare Other

## 2020-02-27 ENCOUNTER — Encounter (HOSPITAL_COMMUNITY): Payer: Self-pay | Admitting: *Deleted

## 2020-02-27 ENCOUNTER — Inpatient Hospital Stay (HOSPITAL_COMMUNITY)
Admission: EM | Admit: 2020-02-27 | Discharge: 2020-03-04 | DRG: 871 | Disposition: A | Discharge status: Home Health | Payer: Medicare Other | Attending: Internal Medicine | Admitting: Internal Medicine

## 2020-02-27 ENCOUNTER — Inpatient Hospital Stay (HOSPITAL_COMMUNITY): Payer: Medicare Other

## 2020-02-27 DIAGNOSIS — Z90710 Acquired absence of both cervix and uterus: Secondary | ICD-10-CM | POA: Diagnosis not present

## 2020-02-27 DIAGNOSIS — E559 Vitamin D deficiency, unspecified: Secondary | ICD-10-CM | POA: Diagnosis present

## 2020-02-27 DIAGNOSIS — R4182 Altered mental status, unspecified: Secondary | ICD-10-CM | POA: Diagnosis not present

## 2020-02-27 DIAGNOSIS — R000 Tachycardia, unspecified: Secondary | ICD-10-CM | POA: Diagnosis not present

## 2020-02-27 DIAGNOSIS — E861 Hypovolemia: Secondary | ICD-10-CM | POA: Diagnosis present

## 2020-02-27 DIAGNOSIS — K2100 Gastro-esophageal reflux disease with esophagitis, without bleeding: Secondary | ICD-10-CM | POA: Diagnosis present

## 2020-02-27 DIAGNOSIS — F1010 Alcohol abuse, uncomplicated: Secondary | ICD-10-CM | POA: Diagnosis present

## 2020-02-27 DIAGNOSIS — R1084 Generalized abdominal pain: Secondary | ICD-10-CM

## 2020-02-27 DIAGNOSIS — E878 Other disorders of electrolyte and fluid balance, not elsewhere classified: Secondary | ICD-10-CM

## 2020-02-27 DIAGNOSIS — E871 Hypo-osmolality and hyponatremia: Secondary | ICD-10-CM

## 2020-02-27 DIAGNOSIS — K869 Disease of pancreas, unspecified: Secondary | ICD-10-CM

## 2020-02-27 DIAGNOSIS — A419 Sepsis, unspecified organism: Secondary | ICD-10-CM | POA: Diagnosis not present

## 2020-02-27 DIAGNOSIS — R7881 Bacteremia: Secondary | ICD-10-CM | POA: Diagnosis not present

## 2020-02-27 DIAGNOSIS — D649 Anemia, unspecified: Secondary | ICD-10-CM | POA: Diagnosis present

## 2020-02-27 DIAGNOSIS — K8689 Other specified diseases of pancreas: Secondary | ICD-10-CM | POA: Diagnosis not present

## 2020-02-27 DIAGNOSIS — K863 Pseudocyst of pancreas: Secondary | ICD-10-CM | POA: Diagnosis present

## 2020-02-27 DIAGNOSIS — N179 Acute kidney failure, unspecified: Secondary | ICD-10-CM | POA: Diagnosis present

## 2020-02-27 DIAGNOSIS — E872 Acidosis, unspecified: Secondary | ICD-10-CM | POA: Diagnosis present

## 2020-02-27 DIAGNOSIS — E876 Hypokalemia: Secondary | ICD-10-CM | POA: Diagnosis present

## 2020-02-27 DIAGNOSIS — K7689 Other specified diseases of liver: Secondary | ICD-10-CM | POA: Diagnosis present

## 2020-02-27 DIAGNOSIS — I10 Essential (primary) hypertension: Secondary | ICD-10-CM | POA: Diagnosis present

## 2020-02-27 DIAGNOSIS — E8729 Other acidosis: Secondary | ICD-10-CM

## 2020-02-27 DIAGNOSIS — F1020 Alcohol dependence, uncomplicated: Secondary | ICD-10-CM | POA: Diagnosis present

## 2020-02-27 DIAGNOSIS — E440 Moderate protein-calorie malnutrition: Secondary | ICD-10-CM | POA: Diagnosis present

## 2020-02-27 DIAGNOSIS — K219 Gastro-esophageal reflux disease without esophagitis: Secondary | ICD-10-CM

## 2020-02-27 DIAGNOSIS — A409 Streptococcal sepsis, unspecified: Principal | ICD-10-CM

## 2020-02-27 DIAGNOSIS — Z79899 Other long term (current) drug therapy: Secondary | ICD-10-CM

## 2020-02-27 DIAGNOSIS — R6520 Severe sepsis without septic shock: Secondary | ICD-10-CM

## 2020-02-27 DIAGNOSIS — Z560 Unemployment, unspecified: Secondary | ICD-10-CM

## 2020-02-27 DIAGNOSIS — E860 Dehydration: Secondary | ICD-10-CM

## 2020-02-27 DIAGNOSIS — N390 Urinary tract infection, site not specified: Secondary | ICD-10-CM | POA: Diagnosis present

## 2020-02-27 DIAGNOSIS — K859 Acute pancreatitis without necrosis or infection, unspecified: Secondary | ICD-10-CM | POA: Diagnosis present

## 2020-02-27 DIAGNOSIS — B9620 Unspecified Escherichia coli [E. coli] as the cause of diseases classified elsewhere: Secondary | ICD-10-CM | POA: Diagnosis present

## 2020-02-27 DIAGNOSIS — Z20822 Contact with and (suspected) exposure to covid-19: Secondary | ICD-10-CM | POA: Diagnosis present

## 2020-02-27 DIAGNOSIS — E222 Syndrome of inappropriate secretion of antidiuretic hormone: Secondary | ICD-10-CM | POA: Diagnosis present

## 2020-02-27 DIAGNOSIS — Z7141 Alcohol abuse counseling and surveillance of alcoholic: Secondary | ICD-10-CM

## 2020-02-27 DIAGNOSIS — G9341 Metabolic encephalopathy: Secondary | ICD-10-CM | POA: Diagnosis present

## 2020-02-27 DIAGNOSIS — K861 Other chronic pancreatitis: Secondary | ICD-10-CM | POA: Diagnosis present

## 2020-02-27 LAB — MAGNESIUM
Magnesium: 1.8 mg/dL (ref 1.7–2.4)
Magnesium: 1.7 mg/dL (ref 1.7–2.4)

## 2020-02-27 LAB — URINALYSIS, ROUTINE W REFLEX MICROSCOPIC
Bilirubin Urine: NEGATIVE
Glucose, UA: NEGATIVE mg/dL
Hgb urine dipstick: NEGATIVE
Ketones, ur: NEGATIVE mg/dL
Nitrite: NEGATIVE
Protein, ur: 30 mg/dL — AB
Specific Gravity, Urine: 1.012 (ref 1.005–1.030)
pH: 7 (ref 5.0–8.0)

## 2020-02-27 LAB — COMPREHENSIVE METABOLIC PANEL
ALT: 20 U/L (ref 0–44)
AST: 30 U/L (ref 15–41)
Albumin: 2.8 g/dL — ABNORMAL LOW (ref 3.5–5.0)
Alkaline Phosphatase: 113 U/L (ref 38–126)
Anion gap: 17 — ABNORMAL HIGH (ref 5–15)
BUN: 13 mg/dL (ref 8–23)
CO2: 18 mmol/L — ABNORMAL LOW (ref 22–32)
Calcium: 8.8 mg/dL — ABNORMAL LOW (ref 8.9–10.3)
Chloride: 87 mmol/L — ABNORMAL LOW (ref 98–111)
Creatinine, Ser: 1.47 mg/dL — ABNORMAL HIGH (ref 0.44–1.00)
GFR calc Af Amer: 43 mL/min — ABNORMAL LOW (ref >60)
GFR calc non Af Amer: 37 mL/min — ABNORMAL LOW (ref >60)
Glucose, Bld: 133 mg/dL — ABNORMAL HIGH (ref 70–99)
Potassium: 4 mmol/L (ref 3.5–5.1)
Sodium: 122 mmol/L — ABNORMAL LOW (ref 135–145)
Total Bilirubin: 1.1 mg/dL (ref 0.3–1.2)
Total Protein: 7.4 g/dL (ref 6.5–8.1)

## 2020-02-27 LAB — RAPID URINE DRUG SCREEN, HOSP PERFORMED
Amphetamines: NOT DETECTED
Barbiturates: NOT DETECTED
Benzodiazepines: NOT DETECTED
Cocaine: NOT DETECTED
Opiates: NOT DETECTED
Tetrahydrocannabinol: NOT DETECTED

## 2020-02-27 LAB — LIPASE, BLOOD: Lipase: 112 U/L — ABNORMAL HIGH (ref 11–51)

## 2020-02-27 LAB — TSH: TSH: 1.795 u[IU]/mL (ref 0.350–4.500)

## 2020-02-27 LAB — LACTIC ACID, PLASMA
Lactic Acid, Venous: 5.5 mmol/L (ref 0.5–1.9)
Lactic Acid, Venous: 5.2 mmol/L (ref 0.5–1.9)
Lactic Acid, Venous: 1.7 mmol/L (ref 0.5–1.9)
Lactic Acid, Venous: 1.5 mmol/L (ref 0.5–1.9)

## 2020-02-27 LAB — CBC WITH DIFFERENTIAL/PLATELET
Abs Immature Granulocytes: 0.04 10*3/uL (ref 0.00–0.07)
Basophils Absolute: 0 10*3/uL (ref 0.0–0.1)
Basophils Relative: 0 %
Eosinophils Absolute: 0 10*3/uL (ref 0.0–0.5)
Eosinophils Relative: 0 %
HCT: 31.6 % — ABNORMAL LOW (ref 36.0–46.0)
Hemoglobin: 10.2 g/dL — ABNORMAL LOW (ref 12.0–15.0)
Immature Granulocytes: 1 %
Lymphocytes Relative: 4 %
Lymphs Abs: 0.4 10*3/uL — ABNORMAL LOW (ref 0.7–4.0)
MCH: 32.3 pg (ref 26.0–34.0)
MCHC: 32.3 g/dL (ref 30.0–36.0)
MCV: 100 fL (ref 80.0–100.0)
Monocytes Absolute: 0 10*3/uL — ABNORMAL LOW (ref 0.1–1.0)
Monocytes Relative: 0 %
Neutro Abs: 7.9 10*3/uL — ABNORMAL HIGH (ref 1.7–7.7)
Neutrophils Relative %: 95 %
Platelets: 308 10*3/uL (ref 150–400)
RBC: 3.16 MIL/uL — ABNORMAL LOW (ref 3.87–5.11)
RDW: 14.4 % (ref 11.5–15.5)
WBC: 8.4 10*3/uL (ref 4.0–10.5)
nRBC: 0 % (ref 0.0–0.2)

## 2020-02-27 LAB — SARS CORONAVIRUS 2 BY RT PCR (HOSPITAL ORDER, PERFORMED IN ~~LOC~~ HOSPITAL LAB): SARS Coronavirus 2: NEGATIVE

## 2020-02-27 LAB — C DIFFICILE QUICK SCREEN W PCR REFLEX
C Diff antigen: NEGATIVE
C Diff interpretation: NOT DETECTED
C Diff toxin: NEGATIVE

## 2020-02-27 LAB — PROCALCITONIN: Procalcitonin: 70.14 ng/mL

## 2020-02-27 LAB — PHOSPHORUS: Phosphorus: 3.1 mg/dL (ref 2.5–4.6)

## 2020-02-27 LAB — MRSA PCR SCREENING: MRSA by PCR: NEGATIVE

## 2020-02-27 LAB — VITAMIN D 25 HYDROXY (VIT D DEFICIENCY, FRACTURES): Vit D, 25-Hydroxy: 6.67 ng/mL — ABNORMAL LOW (ref 30–100)

## 2020-02-27 LAB — HEMOGLOBIN A1C
Hgb A1c MFr Bld: 5.4 % (ref 4.8–5.6)
Mean Plasma Glucose: 108.28 mg/dL

## 2020-02-27 LAB — CK: Total CK: 26 U/L — ABNORMAL LOW (ref 38–234)

## 2020-02-27 LAB — ETHANOL: Alcohol, Ethyl (B): <10 mg/dL (ref <10)

## 2020-02-27 MED ORDER — PANTOPRAZOLE SODIUM 40 MG IV SOLR
40.0000 mg | INTRAVENOUS | Status: DC
  Administered 2020-02-27 – 2020-03-03 (×6): 40 mg via INTRAVENOUS
  Filled 2020-02-27 (×6): qty 40

## 2020-02-27 MED ORDER — SODIUM CHLORIDE 0.9 % IV BOLUS
500.0000 mL | Freq: Once | INTRAVENOUS | Status: AC
  Administered 2020-02-27: 500 mL via INTRAVENOUS

## 2020-02-27 MED ORDER — ACETAMINOPHEN 650 MG RE SUPP: 650.0000 mg | Freq: Four times a day (QID) | RECTAL | Status: DC | PRN

## 2020-02-27 MED ORDER — LORAZEPAM 1 MG PO TABS: 0.0000 mg | ORAL_TABLET | Freq: Four times a day (QID) | ORAL | Status: DC

## 2020-02-27 MED ORDER — SODIUM CHLORIDE 0.9 % IV SOLN: INTRAVENOUS | Status: DC

## 2020-02-27 MED ORDER — THIAMINE HCL 100 MG/ML IJ SOLN: 100.0000 mg | Freq: Every day | INTRAMUSCULAR | Status: DC

## 2020-02-27 MED ORDER — THIAMINE HCL 100 MG PO TABS
100.0000 mg | ORAL_TABLET | Freq: Every day | ORAL | Status: DC
  Administered 2020-02-28 – 2020-03-04 (×6): 100 mg via ORAL
  Filled 2020-02-27 (×6): qty 1

## 2020-02-27 MED ORDER — ENOXAPARIN SODIUM 30 MG/0.3ML ~~LOC~~ SOLN
30.0000 mg | SUBCUTANEOUS | Status: DC
  Administered 2020-02-27: 30 mg via SUBCUTANEOUS
  Filled 2020-02-27: qty 0.3

## 2020-02-27 MED ORDER — ONDANSETRON HCL 4 MG PO TABS: 4.0000 mg | ORAL_TABLET | Freq: Four times a day (QID) | ORAL | Status: DC | PRN

## 2020-02-27 MED ORDER — DEXTROMETHORPHAN POLISTIREX ER 30 MG/5ML PO SUER
30.0000 mg | Freq: Two times a day (BID) | ORAL | Status: DC | PRN
  Filled 2020-02-27: qty 5

## 2020-02-27 MED ORDER — LORAZEPAM 2 MG/ML IJ SOLN: 0.0000 mg | Freq: Two times a day (BID) | INTRAMUSCULAR | Status: AC

## 2020-02-27 MED ORDER — SODIUM CHLORIDE 0.9 % IV BOLUS
1000.0000 mL | Freq: Once | INTRAVENOUS | Status: AC
  Administered 2020-02-27: 1000 mL via INTRAVENOUS

## 2020-02-27 MED ORDER — FOLIC ACID 1 MG PO TABS
1.0000 mg | ORAL_TABLET | Freq: Every day | ORAL | Status: DC
  Administered 2020-02-27 – 2020-03-04 (×7): 1 mg via ORAL
  Filled 2020-02-27 (×7): qty 1

## 2020-02-27 MED ORDER — LORAZEPAM 2 MG/ML IJ SOLN: 1.0000 mg | INTRAMUSCULAR | Status: AC | PRN

## 2020-02-27 MED ORDER — SODIUM CHLORIDE 0.9 % IV SOLN
2.0000 g | INTRAVENOUS | Status: DC
  Administered 2020-02-28 – 2020-03-04 (×6): 2 g via INTRAVENOUS
  Filled 2020-02-27 (×6): qty 20

## 2020-02-27 MED ORDER — CHLORHEXIDINE GLUCONATE CLOTH 2 % EX PADS
6.0000 | MEDICATED_PAD | Freq: Every day | CUTANEOUS | Status: DC
  Administered 2020-02-27 – 2020-03-04 (×7): 6 via TOPICAL

## 2020-02-27 MED ORDER — SODIUM CHLORIDE 0.9 % IV SOLN
1.0000 g | Freq: Once | INTRAVENOUS | Status: AC
  Administered 2020-02-27: 1 g via INTRAVENOUS
  Filled 2020-02-27: qty 10

## 2020-02-27 MED ORDER — POLYETHYLENE GLYCOL 3350 17 G PO PACK: 17.0000 g | PACK | Freq: Every day | ORAL | Status: DC | PRN

## 2020-02-27 MED ORDER — ONDANSETRON HCL 4 MG/2ML IJ SOLN
4.0000 mg | Freq: Four times a day (QID) | INTRAMUSCULAR | Status: DC | PRN
  Administered 2020-02-28: 4 mg via INTRAVENOUS
  Filled 2020-02-27 (×2): qty 2

## 2020-02-27 MED ORDER — IOHEXOL 9 MG/ML PO SOLN
ORAL | Status: AC
  Filled 2020-02-27: qty 1000

## 2020-02-27 MED ORDER — ACETAMINOPHEN 325 MG PO TABS
650.0000 mg | ORAL_TABLET | Freq: Four times a day (QID) | ORAL | Status: DC | PRN
  Administered 2020-03-03: 650 mg via ORAL
  Filled 2020-02-27: qty 2

## 2020-02-27 MED ORDER — LORAZEPAM 1 MG PO TABS: 1.0000 mg | ORAL_TABLET | ORAL | Status: AC | PRN

## 2020-02-27 MED ORDER — THIAMINE HCL 100 MG PO TABS
100.0000 mg | ORAL_TABLET | Freq: Every day | ORAL | Status: DC
  Administered 2020-02-27: 100 mg via ORAL
  Filled 2020-02-27: qty 1

## 2020-02-27 MED ORDER — ADULT MULTIVITAMIN W/MINERALS CH
1.0000 | ORAL_TABLET | Freq: Every day | ORAL | Status: DC
  Administered 2020-02-27 – 2020-03-04 (×7): 1 via ORAL
  Filled 2020-02-27 (×7): qty 1

## 2020-02-27 MED ORDER — LORAZEPAM 2 MG/ML IJ SOLN: 0.0000 mg | Freq: Four times a day (QID) | INTRAMUSCULAR | Status: DC

## 2020-02-27 MED ORDER — LORAZEPAM 1 MG PO TABS: 0.0000 mg | ORAL_TABLET | Freq: Two times a day (BID) | ORAL | Status: DC

## 2020-02-27 MED ORDER — LORAZEPAM 2 MG/ML IJ SOLN
0.0000 mg | Freq: Four times a day (QID) | INTRAMUSCULAR | Status: AC
  Administered 2020-02-27 – 2020-02-28 (×3): 1 mg via INTRAVENOUS
  Filled 2020-02-27 (×3): qty 1

## 2020-02-27 MED ORDER — LORAZEPAM 2 MG/ML IJ SOLN: 0.0000 mg | Freq: Two times a day (BID) | INTRAMUSCULAR | Status: DC

## 2020-02-27 MED ORDER — METRONIDAZOLE IN NACL 5-0.79 MG/ML-% IV SOLN
500.0000 mg | Freq: Three times a day (TID) | INTRAVENOUS | Status: DC
  Administered 2020-02-27 – 2020-02-29 (×7): 500 mg via INTRAVENOUS
  Filled 2020-02-27 (×7): qty 100

## 2020-02-27 NOTE — ED Notes (Signed)
Contact Pt's husband Legrand Como at [phone] if needed.

## 2020-02-27 NOTE — ED Triage Notes (Signed)
Pt arrived to er by ems after pt's brother called ems out for generalized weakness, family reports that pt has not been eating or drinking well over the past few days, ems reports that pt was tachy with hr 140's upon their arrival, pt was given fluid bolus of 250 cc normal saline with improvement of hr. Pt complains of abd pain, denies any n/v/d.

## 2020-02-27 NOTE — ED Provider Notes (Signed)
[facility] EMERGENCY DEPARTMENT Provider Note   CSN: [id number] Arrival date & time: 02/27/20  0431   Time seen 5:17 AM  History Chief Complaint  Patient presents with  . Weakness    Judy Davis is a 64 y.o. female.  HPI   Patient states "my stomach has been hurting".  She states it started about a week ago.  She indicates her lower abdomen.  She states the pain comes and goes and when asked how long the pain last she states "1 week".  She states she is never had it before.  She denies nausea, vomiting, diarrhea, or fever.  She states she has a mild cough.  She states she has not been eating because eating makes the pain worse.  When I asked her to describe the pain she states "it is a little bit".  When I asked her to describe the pain such as sharp, dull, achy, burning she states "a little bit".  She then states it feels like a cold.  Patient states she drinks 1 small can of beer a day.  [facility], [facility]   Past Medical History:  Diagnosis Date  . ETOH abuse   . HTN (hypertension)     Patient Active Problem List   Diagnosis Date Noted  . Severe sepsis ([place]) 02/27/2020  . Gait instability 08/10/2019  . Alcohol abuse 08/08/2019  . Intractable nausea and vomiting 08/08/2019  . Intractable vomiting 08/07/2019  . Hematoma 09/29/2016  . Elevated AST (SGOT) 09/29/2016  . Hyperglycemia 09/29/2016  . Hypokalemia 09/29/2016  . Upper respiratory infection 07/06/2011  . UTI (lower urinary tract infection) 07/06/2011  . Nausea and vomiting 07/05/2011  . Hyponatremia 07/05/2011  . HTN (hypertension) 07/05/2011  . Anemia 07/05/2011    Past Surgical History:  Procedure Laterality Date  . ABDOMINAL HYSTERECTOMY  1970's  . ESOPHAGOGASTRODUODENOSCOPY (EGD) WITH PROPOFOL N/A 08/09/2019   Fields: LA Grade C esophagitis. gastritis. no h.pylori     OB History   No obstetric history on file.     Family History  Problem Relation Age of Onset  . Other  Mother        Homicide  . Colon cancer Neg Hx   . Colon polyps Neg Hx     Social History   Tobacco Use  . Smoking status: Never Smoker  . Smokeless tobacco: Never Used  Substance Use Topics  . Alcohol use: Yes    Comment: 2-3 beers a day when she can afford it  . Drug use: No  Lives alone  Home Medications Prior to Admission medications   Medication Sig Start Date End Date Taking? Authorizing Provider  ondansetron (ZOFRAN ODT) 4 MG disintegrating tablet Take 1 tablet (4 mg total) by mouth every 8 (eight) hours as needed for nausea or vomiting. 08/03/19   Evalee Jefferson, PA-C  pantoprazole (PROTONIX) 40 MG tablet Take 1 tablet (40 mg total) by mouth 2 (two) times daily before a meal. 08/10/19   Orson Eva, MD    Allergies    Patient has no known allergies.  Review of Systems   Review of Systems  All other systems reviewed and are negative.   Physical Exam Updated Vital Signs BP 110/66   Pulse (!) 135   Temp 98.7 F (37.1 C) (Oral)   Resp (!) 23   Ht _0  (1.626 m)   Wt 41 kg   SpO2 98%   BMI 15.52 kg/m   Physical Exam Vitals and nursing  note reviewed.  Constitutional:      Appearance: Normal appearance. She is normal weight.  HENT:     Head: Normocephalic and atraumatic.     Right Ear: External ear normal.     Left Ear: External ear normal.     Mouth/Throat:     Mouth: Mucous membranes are dry.  Eyes:     General: No scleral icterus.    Extraocular Movements: Extraocular movements intact.     Conjunctiva/sclera: Conjunctivae normal.     Pupils: Pupils are equal, round, and reactive to light.     Comments: Pale conjunctiva  Cardiovascular:     Rate and Rhythm: Regular rhythm. Tachycardia present.  Pulmonary:     Effort: Pulmonary effort is normal. No respiratory distress.     Breath sounds: Normal breath sounds. No wheezing or rales.  Abdominal:     General: Abdomen is flat. Bowel sounds are normal.     Palpations: Abdomen is soft.     Tenderness:  There is abdominal tenderness in the right lower quadrant, suprapubic area and left lower quadrant. There is no guarding or rebound.  Musculoskeletal:        General: Normal range of motion.     Cervical back: Normal range of motion.  Skin:    General: Skin is warm and dry.     Findings: No erythema.  Neurological:     General: No focal deficit present.     Mental Status: She is alert and oriented to person, place, and time.     Cranial Nerves: No cranial nerve deficit.  Psychiatric:        Mood and Affect: Affect is flat.        Behavior: Behavior normal.        Thought Content: Thought content normal.     ED Results / Procedures / Treatments   Labs (all labs ordered are listed, but only abnormal results are displayed) Results for orders placed or performed during the hospital encounter of 02/27/20  CBC with Differential  Result Value Ref Range   WBC 8.4 4.0 - 10.5 K/uL   RBC 3.16 (L) 3.87 - 5.11 MIL/uL   Hemoglobin 10.2 (L) 12.0 - 15.0 g/dL   HCT 31.6 (L) 36 - 46 %   MCV 100.0 80.0 - 100.0 fL   MCH 32.3 26.0 - 34.0 pg   MCHC 32.3 30.0 - 36.0 g/dL   RDW 14.4 11.5 - 15.5 %   Platelets 308 150 - 400 K/uL   nRBC 0.0 0.0 - 0.2 %   Neutrophils Relative % 95 %   Neutro Abs 7.9 (H) 1.7 - 7.7 K/uL   Lymphocytes Relative 4 %   Lymphs Abs 0.4 (L) 0.7 - 4.0 K/uL   Monocytes Relative 0 %   Monocytes Absolute 0.0 (L) 0 - 1 K/uL   Eosinophils Relative 0 %   Eosinophils Absolute 0.0 0 - 0 K/uL   Basophils Relative 0 %   Basophils Absolute 0.0 0 - 0 K/uL   Immature Granulocytes 1 %   Abs Immature Granulocytes 0.04 0.00 - 0.07 K/uL  Comprehensive metabolic panel  Result Value Ref Range   Sodium 122 (L) 135 - 145 mmol/L   Potassium 4.0 3.5 - 5.1 mmol/L   Chloride 87 (L) 98 - 111 mmol/L   CO2 18 (L) 22 - 32 mmol/L   Glucose, Bld 133 (H) 70 - 99 mg/dL   BUN 13 8 - 23 mg/dL   Creatinine, Ser 1.47 (H) 0.44 -  1.00 mg/dL   Calcium 8.8 (L) 8.9 - 10.3 mg/dL   Total Protein 7.4 6.5 -  8.1 g/dL   Albumin 2.8 (L) 3.5 - 5.0 g/dL   AST 30 15 - 41 U/L   ALT 20 0 - 44 U/L   Alkaline Phosphatase 113 38 - 126 U/L   Total Bilirubin 1.1 0.3 - 1.2 mg/dL   GFR calc non Af Amer 37 (L) >60 mL/min   GFR calc Af Amer 43 (L) >60 mL/min   Anion gap 17 (H) 5 - 15  Urinalysis, Routine w reflex microscopic  Result Value Ref Range   Color, Urine AMBER (A) YELLOW   APPearance CLOUDY (A) CLEAR   Specific Gravity, Urine 1.012 1.005 - 1.030   pH 7.0 5.0 - 8.0   Glucose, UA NEGATIVE NEGATIVE mg/dL   Hgb urine dipstick NEGATIVE NEGATIVE   Bilirubin Urine NEGATIVE NEGATIVE   Ketones, ur NEGATIVE NEGATIVE mg/dL   Protein, ur 30 (A) NEGATIVE mg/dL   Nitrite NEGATIVE NEGATIVE   Leukocytes,Ua TRACE (A) NEGATIVE   RBC / HPF 0-5 0 - 5 RBC/hpf   WBC, UA 21-50 0 - 5 WBC/hpf   Bacteria, UA MANY (A) NONE SEEN   Squamous Epithelial / LPF 0-5 0 - 5   WBC Clumps PRESENT    Mucus PRESENT    Hyaline Casts, UA PRESENT   Ethanol  Result Value Ref Range   Alcohol, Ethyl (B) <10 <10 mg/dL  Lactic acid, plasma  Result Value Ref Range   Lactic Acid, Venous 5.5 (HH) 0.5 - 1.9 mmol/L  CK  Result Value Ref Range   Total CK 26 (L) 38.0 - 234.0 U/L  Magnesium  Result Value Ref Range   Magnesium 1.8 1.7 - 2.4 mg/dL    Laboratory interpretation all normal except stable anemia, hyponatremia, low chloride, new acute renal insufficiency, nonfasting hyperglycemia, malnutrition with albumin 2.8, elevation anion gap   EKG EKG Interpretation  Date/Time:  Monday February 27 2020 05:47:57 EDT Ventricular Rate:  136 PR Interval:    QRS Duration: 89 QT Interval:  354 QTC Calculation: 533 R Axis:   98 Text Interpretation: Sinus tachycardia Consider right atrial enlargement Right axis deviation Borderline repolarization abnormality Prolonged QT interval Since last tracing rate faster 07 Aug 2019 Confirmed by Rolland Porter [phone]) on 02/27/2020 6:50:13 AM   Radiology No results found.  Procedures .Critical  Care Performed by: Rolland Porter, MD Authorized by: Rolland Porter, MD   Critical care provider statement:    Critical care time (minutes):  32   Critical care was necessary to treat or prevent imminent or life-threatening deterioration of the following conditions:  Dehydration   Critical care was time spent personally by me on the following activities:  Discussions with consultants, evaluation of patient's response to treatment, examination of patient, obtaining history from patient or surrogate, ordering and review of radiographic studies, ordering and review of laboratory studies, pulse oximetry and re-evaluation of patient's condition   (including critical care time)  Medications Ordered in ED Medications  iohexol (OMNIPAQUE) 9 MG/ML oral solution (has no administration in time range)  cefTRIAXone (ROCEPHIN) 1 g in sodium chloride 0.9 % 100 mL IVPB (has no administration in time range)  LORazepam (ATIVAN) injection 0-4 mg (has no administration in time range)    Or  LORazepam (ATIVAN) tablet 0-4 mg (has no administration in time range)  LORazepam (ATIVAN) injection 0-4 mg (has no administration in time range)    Or  LORazepam (ATIVAN) tablet  0-4 mg (has no administration in time range)  thiamine tablet 100 mg (has no administration in time range)    Or  thiamine (B-1) injection 100 mg (has no administration in time range)  sodium chloride 0.9 % bolus 500 mL (500 mLs Intravenous New Bag/Given 02/27/20 0605)  sodium chloride 0.9 % bolus 1,000 mL (1,000 mLs Intravenous New Bag/Given 02/27/20 4098)    ED Course  I have reviewed the triage vital signs and the nursing notes.  Pertinent labs & imaging results that were available during my care of the patient were reviewed by me and considered in my medical decision making (see chart for details).    MDM Rules/Calculators/A&P                          Patient was given IV fluids for dehydration.  I added some blood work to her prior testing,  magnesium was ordered due to her prolonged QTC and on her EKG.  CK was added.  Patient's initial lactic acid is elevated, she is already getting IV fluids.  She also has a metabolic acidosis on her c-Met with a bicarb of 18 and anion gap of 17.  Her sodium and chloride are low which are consistent with dehydration.  She also has a new acute renal injury with her creatinine now being elevated at 1.47.  CT scan without contrast was done to evaluate her complaints of lower abdominal pain.  [place] protocol was started.  7:25 AM patient's urine is suspicious for UTI, urine culture was added and she was given Rocephin 1 g IV.  Patient is IV fluids were initially normal saline but after reviewing her test results she was put on D5 normal saline bolus and drip.  7:44 AM Dr. Wynetta Emery, hospitalist will admit.  Her CT is still pending.  Final Clinical Impression(s) / ED Diagnoses Final diagnoses:  Acute kidney injury (HCC)  Hyponatremia  Chloride, decreased level  Dehydration  Alcoholic ketoacidosis  Lactic acidosis  Urinary tract infection without hematuria, site unspecified    Rx / DC Orders  Plan admission  Rolland Porter, MD, Barbette Or, MD 02/27/20 [phone]

## 2020-02-27 NOTE — H&P (Signed)
History and Physical  [facility]  Judy Davis EAV:[id number] DOB: 05-Aug-1956 DOA: 02/27/2020  PCP: [facility], [facility]  Patient coming from: Home by [facility]  I have personally briefly reviewed patient's old medical records in [facility]  Chief Complaint: weakness   HPI: Judy Davis is a 64 y.o. female with history of hypertension and chronic alcoholism was sent to the ED by family members when her brother noted that the past several days she has had poor oral intake and has been complaining of abdominal pain.  She is not eating or drinking well.  She has been weak and frail and not ambulating.  Lying in the bed most of the time.  She denies fever and chills.  She is an alcohol consumer but reports that she only drinks several days out of the week and has never had delirium tremens.  She denies having weight loss.  She denies diarrhea.  She denies chest pain and shortness of breath.  Her abdominal pain seems to be worsened with eating and that is why she has not been eating that much.  She describes her pain as sharp radiating into the back with burning and constantly aching.  She had an EGD by Dr. Darrick Penna in November 2020 with findings of grade C esophagitis and gastritis.  H. pylori was negative.  ED Course: She arrived by EMS she was afebrile with heart rate of 144 respirations 27 blood pressure 124/72 pulse ox 100% on room air, received 250 cc bolus by EMS, sodium 122, potassium 4.0, glucose 133, BUN 13, creatinine 1.47, calcium 8.8, anion gap 17, albumin 2.8, CK 26, lactic acid 5.5, hemoglobin 10.2, platelet count 308, urinalysis with many bacteria 21-50 WBC, ethyl alcohol level less than 10 EKG with sinus tachycardia, CT abdomen with findings of acute on chronic pancreatitis with pseudocyst, no bowel obstruction no abscess seen and absent uterus.  Chest x-ray with bibasilar atelectasis otherwise no acute findings.  Urine drug screen negative for recreational  substances.  MSRA screen negative.  Admission was requested for further evaluation and management.  Review of Systems: As per HPI otherwise 10 point review of systems negative.    Past Medical History:  Diagnosis Date  . ETOH abuse   . HTN (hypertension)     Past Surgical History:  Procedure Laterality Date  . ABDOMINAL HYSTERECTOMY  1970's  . ESOPHAGOGASTRODUODENOSCOPY (EGD) WITH PROPOFOL N/A 08/09/2019   Fields: LA Grade C esophagitis. gastritis. no h.pylori     reports that she has never smoked. She has never used smokeless tobacco. She reports current alcohol use. She reports that she does not use drugs.  No Known Allergies  Family History  Problem Relation Age of Onset  . Other Mother        Homicide  . Colon cancer Neg Hx   . Colon polyps Neg Hx      Prior to Admission medications   Medication Sig Start Date End Date Taking? Authorizing Provider  ondansetron (ZOFRAN ODT) 4 MG disintegrating tablet Take 1 tablet (4 mg total) by mouth every 8 (eight) hours as needed for nausea or vomiting. 08/03/19   Burgess Amor, PA-C  pantoprazole (PROTONIX) 40 MG tablet Take 1 tablet (40 mg total) by mouth 2 (two) times daily before a meal. 08/10/19   Catarina Hartshorn, MD    Physical Exam: Vitals:   02/27/20 0437 02/27/20 0441 02/27/20 0530 02/27/20 0631  BP:  124/72 130/69 110/66  Pulse:  (!) 144 Marland Kitchen)  136 (!) 135  Resp:  (!) 27 (!) 29 (!) 23  Temp:  98.7 F (37.1 C)    TempSrc:  Oral    SpO2:  100% 99% 98%  Weight: 41 kg     Height: 5\' 4"  (1.626 m)      Constitutional: Thin emaciated, chronically ill-appearing female lying in the bed she is awake and alert and talkative.  NAD, calm, comfortable Eyes: PERRL, lids and conjunctivae normal ENMT: Mucous membranes are dry.  Posterior pharynx clear of any exudate or lesions. poor dentition.  Neck: normal, supple, no masses, no thyromegaly Respiratory: clear to auscultation bilaterally, no wheezing, no crackles. Normal respiratory  effort. No accessory muscle use.  Cardiovascular: Tachycardic rate, normal S1-S2 sounds, no murmurs / rubs / gallops. No extremity edema. 2+ pedal pulses. No carotid bruits.  Abdomen: Mild epigastric tenderness with deep palpation, no masses palpated. No hepatosplenomegaly. Bowel sounds positive.  Musculoskeletal: no clubbing / cyanosis. No joint deformity upper and lower extremities. Good ROM, no contractures. Normal muscle tone.  Skin: no rashes, lesions, ulcers. No induration Neurologic: CN 2-12 grossly intact. Sensation intact, DTR normal. Strength 5/5 in all 4.  Psychiatric: Normal judgment and insight. Alert and oriented x 3. Normal mood.   Labs on Admission: I have personally reviewed following labs and imaging studies  CBC: Recent Labs  Lab 02/27/20 0447  WBC 8.4  NEUTROABS 7.9*  HGB 10.2*  HCT 31.6*  MCV 100.0  PLT 308   Basic Metabolic Panel: Recent Labs  Lab 02/27/20 0447 02/27/20 0608  NA 122*  --   K 4.0  --   CL 87*  --   CO2 18*  --   GLUCOSE 133*  --   BUN 13  --   CREATININE 1.47*  --   CALCIUM 8.8*  --   MG  --  1.8   GFR: Estimated Creatinine Clearance: 24.7 mL/min (A) (by C-G formula based on SCr of 1.47 mg/dL (H)). Liver Function Tests: Recent Labs  Lab 02/27/20 0447  AST 30  ALT 20  ALKPHOS 113  BILITOT 1.1  PROT 7.4  ALBUMIN 2.8*   No results for input(s): LIPASE, AMYLASE in the last 168 hours. No results for input(s): AMMONIA in the last 168 hours. Coagulation Profile: No results for input(s): INR, PROTIME in the last 168 hours. Cardiac Enzymes: Recent Labs  Lab 02/27/20 0608  CKTOTAL 26*   BNP (last 3 results) No results for input(s): PROBNP in the last 8760 hours. HbA1C: No results for input(s): HGBA1C in the last 72 hours. CBG: No results for input(s): GLUCAP in the last 168 hours. Lipid Profile: No results for input(s): CHOL, HDL, LDLCALC, TRIG, CHOLHDL, LDLDIRECT in the last 72 hours. Thyroid Function Tests: No results  for input(s): TSH, T4TOTAL, FREET4, T3FREE, THYROIDAB in the last 72 hours. Anemia Panel: No results for input(s): VITAMINB12, FOLATE, FERRITIN, TIBC, IRON, RETICCTPCT in the last 72 hours. Urine analysis:    Component Value Date/Time   COLORURINE AMBER (A) 02/27/2020 0502   APPEARANCEUR CLOUDY (A) 02/27/2020 0502   LABSPEC 1.012 02/27/2020 0502   PHURINE 7.0 02/27/2020 0502   GLUCOSEU NEGATIVE 02/27/2020 0502   HGBUR NEGATIVE 02/27/2020 0502   BILIRUBINUR NEGATIVE 02/27/2020 0502   KETONESUR NEGATIVE 02/27/2020 0502   PROTEINUR 30 (A) 02/27/2020 0502   UROBILINOGEN 0.2 05/10/2015 1000   NITRITE NEGATIVE 02/27/2020 0502   LEUKOCYTESUR TRACE (A) 02/27/2020 0502    Radiological Exams on Admission: CT Abdomen Pelvis Wo Contrast  Result  Date: 02/27/2020 CLINICAL DATA:  Pain with nausea and vomiting EXAM: CT ABDOMEN AND PELVIS WITHOUT CONTRAST TECHNIQUE: Multidetector CT imaging of the abdomen and pelvis was performed following the standard protocol without IV contrast. Oral contrast was administered. COMPARISON:  August 07, 2019 FINDINGS: Lower chest: There is atelectatic change in the lung bases. There is no lung base edema or airspace opacity. There is coronary artery calcification. Hepatobiliary: There is a cyst in the posterior segment of the right lobe of the liver measuring 3.5 x 3.3 cm, stable. There is mild fatty infiltration near the fissure for the ligamentum teres. No other liver lesions are evident on this noncontrast enhanced study. The gallbladder wall is not appreciably thickened. There is no biliary duct dilatation. Pancreas: The pancreas appears enlarged in the head and body regions with the suggestion of pancreatic edema. There is a somewhat ill-defined mass in the proximal body of the pancreas measuring 3.2 x 3.2 cm which has attenuation values marginally greater than is expected with a simple cyst. There are foci of pancreatic calcification which is stable consistent with  chronic pancreatitis. There is mild peripancreatic fluid which extends to the level of the second portion of the duodenum. Spleen: No splenic lesions are evident. Adrenals/Urinary Tract: There is adrenal hypertrophy bilaterally. Adrenals appear stable compared to prior study. There is a 7 x 7 mm focus of increased attenuation in the lower pole of the left kidney, unchanged from previous study and likely representing a small hyperdense cyst, stable. 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 There is no hydronephrosis on either side. No appreciable renal or ureteral calculus evident on either side. Urinary bladder is midline with wall thickness within normal limits. Stomach/Bowel: There is no appreciable bowel wall or mesenteric thickening. The terminal ileum appears normal. There is mild fatty infiltration in the ileocecal valve. There is no evident bowel obstruction. There is no free air or portal venous air. Vascular/Lymphatic: There is no abdominal aortic aneurysm. There is aortic atherosclerosis. There is also scattered calcification in each common iliac artery. There is no evident adenopathy in the abdomen or pelvis. Reproductive: Uterus is absent.  No pelvic mass evident. Other: No periappendiceal region inflammation. No abscess or ascites is evident in the abdomen or pelvis. There is fat in the umbilicus. Musculoskeletal: There is degenerative change in the lumbar spine. There is degenerative change in each hip joint. No blastic or lytic bone lesions. No intramuscular lesions are evident. IMPRESSION: 1. Edema in the midportion of the pancreas with a mass in the proximal body of the pancreas measuring 3.2 x 3.2 cm, a likely mildly complicated pseudocyst. There is slight peripancreatic fluid. Suspect acute pancreatitis. Calcification noted in the pancreas consistent with underlying chronic pancreatitis as well. With respect to mass in the body of the pancreas, further evaluation with dedicated MR or CT pre and post-contrast to  further evaluate the pancreas is felt to be warranted. The pancreatic duct is not appreciably dilated. 2. Probable hyperdense cyst in the left kidney, stable, measuring 7 mm. Left kidney malrotated. No hydronephrosis. No renal or ureteral calculus on either side. Urinary bladder wall thickness normal. 3.  No bowel obstruction.  No abscess in the abdomen or pelvis. 4. Aortic Atherosclerosis (ICD10-I70.0). Foci of coronary artery calcification and iliac artery calcification also noted. 5.  Uterus absent. Electronically Signed   By: Bretta Bang III M.D.   On: 02/27/2020 08:54   DG Chest Port 1 View  Result Date: 02/27/2020 CLINICAL DATA:  Chest and abdominal pain EXAM:  PORTABLE CHEST 1 VIEW COMPARISON:  Chest radiograph January 10, 2012 FINDINGS: There is slight atelectatic change in the bases. Lungs otherwise are clear. Heart size and pulmonary vascularity are normal. No adenopathy. There is aortic atherosclerosis. No bone lesions. IMPRESSION: Mild bibasilar atelectasis. Lungs otherwise clear. Cardiac silhouette normal. Aortic Atherosclerosis (ICD10-I70.0). Electronically Signed   By: Bretta Bang III M.D.   On: 02/27/2020 08:29   EKG: Independently reviewed. Sinus tachycardia   Assessment/Plan Principal Problem:   Severe sepsis (HCC) Active Problems:   Hyponatremia   HTN (hypertension)   Lower urinary tract infectious disease   Alcohol abuse   Lactic acidosis   Generalized abdominal pain   GERD (gastroesophageal reflux disease)   Altered mental state   Sinus tachycardia   Acute on chronic pancreatitis (HCC)   Pancreatic mass   Pancreatic pseudocyst   1. Acute on chronic pancreatitis with pseudocyst-patient will be admitted for supportive care, IV fluids, bowel rest maintaining n.p.o. status, IV pain and nausea medications ordered as needed.  IV Protonix ordered for GI protection. 2. Severe sepsis-secondary to presumed UTI, treating with IV antibiotics as ordered and supportive care.   Follow lactic acid levels to resolution.  IV hydration ordered. 3. Lactic acidosis-continue with IV fluid hydration and antibiotics.  Following to resolution. 4. Sinus tachycardia-treating supportively. 5. Pancreatic mass-more medically stabilized we will consider MRI for further evaluation GI consultation consideration. 6. Chronic alcoholism-patient reports only mild to moderate consumption however history has not been consistent, she has been placed on a CIWA alcohol withdrawal protocol and started on vitamin supplements. 7. Urinary tract infection-check urine culture continue IV ceftriaxone for now: Culture results. 8. Hyponatremia-likely secondary to severe dehydration which we are treating with normal saline solution. 9. Hypertension-follow blood pressures and treat as needed. 10. Metabolic encephalopathy / Altered mentation likely multifactorial- continue to follow.  Critical Care Procedure Note Authorized and Performed by: Maryln Manuel MD  Total Critical Care time:  45 mins  Due to a high probability of clinically significant, life threatening deterioration, the patient required my highest level of preparedness to intervene emergently and I personally spent this critical care time directly and personally managing the patient.  This critical care time included obtaining a history; examining the patient, pulse oximetry; ordering and review of studies; arranging urgent treatment with development of a management plan; evaluation of patient's response of treatment; frequent reassessment; and discussions with other providers.  This critical care time was performed to assess and manage the high probability of imminent and life threatening deterioration that could result in multi-organ failure.  It was exclusive of separately billable procedures and treating other patients and teaching time.    DVT prophylaxis: Lovenox Code Status: Full Family Communication: Brother Disposition Plan: To be  determined Consults called: N/A Admission status: INP  Tyronza Happe MD [facility] How to contact the [facility] Attending or Consulting provider 7A - 7P or covering provider during after hours 7P -7A, for this patient?  1. Check the care team in [facility] and look for a) attending/consulting [facility] provider listed and b) the [facility] team listed 2. Log into [email or web address] and use [facility] universal password to access. If you do not have the password, please contact the hospital operator. 3. Locate the [facility] provider you are looking for under [facility] and page to a number that you can be directly reached. 4. If you still have difficulty reaching the provider, please page the [facility] (Director on Call) for the Hospitalists listed on amion for  assistance.   If 7PM-7AM, please contact night-coverage [email or web address] Password [facility]  02/27/2020, 9:55 AM

## 2020-02-27 NOTE — ED Notes (Signed)
CRITICAL VALUE ALERT  Critical Value:  Lactic Acid 5.2  Date & Time Notied:  02/27/20 2409  Provider Notified: Murlean Iba, MD  Orders Received/Actions taken: see new orders

## 2020-02-27 NOTE — Plan of Care (Signed)
CRITICAL VALUE ALERT  Critical Value:  Lactic 5.5  Date & Time Notied:  02/27/20 8295  Provider Notified: Rolland Porter, MD  Orders Received/Actions taken: See orders

## 2020-02-28 ENCOUNTER — Inpatient Hospital Stay (HOSPITAL_COMMUNITY): Payer: Medicare Other

## 2020-02-28 ENCOUNTER — Encounter (HOSPITAL_COMMUNITY): Payer: Self-pay | Admitting: Family Medicine

## 2020-02-28 DIAGNOSIS — E440 Moderate protein-calorie malnutrition: Secondary | ICD-10-CM | POA: Insufficient documentation

## 2020-02-28 DIAGNOSIS — E559 Vitamin D deficiency, unspecified: Secondary | ICD-10-CM

## 2020-02-28 DIAGNOSIS — K859 Acute pancreatitis without necrosis or infection, unspecified: Secondary | ICD-10-CM

## 2020-02-28 DIAGNOSIS — K861 Other chronic pancreatitis: Secondary | ICD-10-CM

## 2020-02-28 LAB — CBC WITH DIFFERENTIAL/PLATELET
Abs Immature Granulocytes: 0.33 10*3/uL — ABNORMAL HIGH (ref 0.00–0.07)
Basophils Absolute: 0.1 10*3/uL (ref 0.0–0.1)
Basophils Relative: 0 %
Eosinophils Absolute: 0 10*3/uL (ref 0.0–0.5)
Eosinophils Relative: 0 %
HCT: 27.4 % — ABNORMAL LOW (ref 36.0–46.0)
Hemoglobin: 8.7 g/dL — ABNORMAL LOW (ref 12.0–15.0)
Immature Granulocytes: 2 %
Lymphocytes Relative: 7 %
Lymphs Abs: 1.4 10*3/uL (ref 0.7–4.0)
MCH: 32.2 pg (ref 26.0–34.0)
MCHC: 31.8 g/dL (ref 30.0–36.0)
MCV: 101.5 fL — ABNORMAL HIGH (ref 80.0–100.0)
Monocytes Absolute: 0.6 10*3/uL (ref 0.1–1.0)
Monocytes Relative: 3 %
Neutro Abs: 17.2 10*3/uL — ABNORMAL HIGH (ref 1.7–7.7)
Neutrophils Relative %: 88 %
Platelets: 298 10*3/uL (ref 150–400)
RBC: 2.7 MIL/uL — ABNORMAL LOW (ref 3.87–5.11)
RDW: 14.6 % (ref 11.5–15.5)
WBC: 19.6 10*3/uL — ABNORMAL HIGH (ref 4.0–10.5)
nRBC: 0 % (ref 0.0–0.2)

## 2020-02-28 LAB — COMPREHENSIVE METABOLIC PANEL
ALT: 16 U/L (ref 0–44)
AST: 32 U/L (ref 15–41)
Albumin: 2.3 g/dL — ABNORMAL LOW (ref 3.5–5.0)
Alkaline Phosphatase: 69 U/L (ref 38–126)
Anion gap: 6 (ref 5–15)
BUN: 8 mg/dL (ref 8–23)
CO2: 23 mmol/L (ref 22–32)
Calcium: 8.1 mg/dL — ABNORMAL LOW (ref 8.9–10.3)
Chloride: 100 mmol/L (ref 98–111)
Creatinine, Ser: 0.6 mg/dL (ref 0.44–1.00)
GFR calc Af Amer: >60 mL/min (ref >60)
GFR calc non Af Amer: >60 mL/min (ref >60)
Glucose, Bld: 80 mg/dL (ref 70–99)
Potassium: 3.1 mmol/L — ABNORMAL LOW (ref 3.5–5.1)
Sodium: 129 mmol/L — ABNORMAL LOW (ref 135–145)
Total Bilirubin: 0.6 mg/dL (ref 0.3–1.2)
Total Protein: 6 g/dL — ABNORMAL LOW (ref 6.5–8.1)

## 2020-02-28 LAB — C-REACTIVE PROTEIN: CRP: 20 mg/dL — ABNORMAL HIGH (ref <1.0)

## 2020-02-28 LAB — BLOOD CULTURE ID PANEL (REFLEXED)

## 2020-02-28 LAB — LIPASE, BLOOD: Lipase: 63 U/L — ABNORMAL HIGH (ref 11–51)

## 2020-02-28 LAB — CORTISOL-AM, BLOOD: Cortisol - AM: 15.5 ug/dL (ref 6.7–22.6)

## 2020-02-28 LAB — SEDIMENTATION RATE: Sed Rate: 105 mm/hr — ABNORMAL HIGH (ref 0–22)

## 2020-02-28 LAB — PROTIME-INR
INR: 1.8 — ABNORMAL HIGH (ref 0.8–1.2)
Prothrombin Time: 20.2 seconds — ABNORMAL HIGH (ref 11.4–15.2)

## 2020-02-28 LAB — CEA: CEA: 1.5 ng/mL (ref 0.0–4.7)

## 2020-02-28 LAB — MAGNESIUM: Magnesium: 1.8 mg/dL (ref 1.7–2.4)

## 2020-02-28 MED ORDER — BOOST / RESOURCE BREEZE PO LIQD CUSTOM
1.0000 | Freq: Three times a day (TID) | ORAL | Status: DC
  Administered 2020-02-28 – 2020-03-04 (×12): 1 via ORAL

## 2020-02-28 MED ORDER — ENOXAPARIN SODIUM 40 MG/0.4ML ~~LOC~~ SOLN
40.0000 mg | SUBCUTANEOUS | Status: DC
  Administered 2020-02-28 – 2020-03-03 (×5): 40 mg via SUBCUTANEOUS
  Filled 2020-02-28 (×5): qty 0.4

## 2020-02-28 MED ORDER — GADOBUTROL 1 MMOL/ML IV SOLN
5.0000 mL | Freq: Once | INTRAVENOUS | Status: AC | PRN
  Administered 2020-02-28: 5 mL via INTRAVENOUS

## 2020-02-28 MED ORDER — VITAMIN D (ERGOCALCIFEROL) 1.25 MG (50000 UNIT) PO CAPS
50000.0000 [IU] | ORAL_CAPSULE | ORAL | Status: DC
  Administered 2020-03-01: 50000 [IU] via ORAL
  Filled 2020-02-28 (×2): qty 1

## 2020-02-28 MED ORDER — POTASSIUM CHLORIDE 10 MEQ/100ML IV SOLN
10.0000 meq | INTRAVENOUS | Status: AC
  Administered 2020-02-28 (×4): 10 meq via INTRAVENOUS
  Filled 2020-02-28 (×2): qty 100

## 2020-02-28 MED ORDER — MAGNESIUM SULFATE 2 GM/50ML IV SOLN
2.0000 g | Freq: Once | INTRAVENOUS | Status: AC
  Administered 2020-02-28: 2 g via INTRAVENOUS
  Filled 2020-02-28: qty 50

## 2020-02-28 NOTE — Plan of Care (Signed)
°  Problem: Acute Rehab PT Goals(only PT should resolve) Goal: Patient Will Transfer Sit To/From Stand Outcome: Progressing Flowsheets (Taken 02/28/2020 1220) Patient will transfer sit to/from stand: with modified independence Goal: Pt Will Transfer Bed To Chair/Chair To Bed Outcome: Progressing Flowsheets (Taken 02/28/2020 1220) Pt will Transfer Bed to Chair/Chair to Bed: with modified independence Goal: Pt Will Ambulate Outcome: Progressing Flowsheets (Taken 02/28/2020 1220) Pt will Ambulate:  > 125 feet  with supervision  with least restrictive assistive device   Tori Adline Kirshenbaum PT, DPT 02/28/20, 12:20 PM [phone]

## 2020-02-28 NOTE — Evaluation (Signed)
Physical Therapy Evaluation Patient Details Name: Judy Davis MRN: [id number] DOB: 12-Jun-1956 Today's Date: 02/28/2020   History of Present Illness  64 y.o. female with history of hypertension and chronic alcoholism was sent to the ED by family members when her brother noted that the past several days she has had poor oral intake and has been complaining of abdominal pain.  She is not eating or drinking well.  She has been weak and frail and not ambulating.  Lying in the bed most of the time.  She denies fever and chills.  She is an alcohol consumer but reports that she only drinks several days out of the week and has never had delirium tremens.  She denies having weight loss.  She denies diarrhea.  She denies chest pain and shortness of breath.  Her abdominal pain seems to be worsened with eating and that is why she has not been eating that much.  She describes her pain as sharp radiating into the back with burning and constantly aching.  She had an EGD by Dr. Darrick Penna in November 2020 with findings of grade C esophagitis and gastritis.  H. pylori was negative.  Clinical Impression  Pt admitted with above diagnosis. Pt able to take unsteady steps in room with min A to steady. Pt transferred to [facility] for BM requiring cues for hand placement for safety. Pt able to maintain static standing with HHA while nurse provided pericare and transfer back to bed. Pt follows commands appropriately, flat affect, reports spouse is home 24/7 and has good family support to assist; no family present to verify. Pt currently with functional limitations due to the deficits listed below (see PT Problem List). Pt will benefit from skilled PT to increase their independence and safety with mobility to allow discharge to the venue listed below.       Follow Up Recommendations Home health PT;Supervision - Intermittent;Supervision for mobility/OOB    Equipment Recommendations  Rolling walker with 5" wheels    Recommendations  for Other Services       Precautions / Restrictions Precautions Precautions: Fall Restrictions Weight Bearing Restrictions: No      Mobility  Bed Mobility Overal bed mobility: Modified Independent  General bed mobility comments: increased time with use of bedrail for supine<>sit transfers  Transfers Overall transfer level: Needs assistance Equipment used: 1 person hand held assist Transfers: Sit to/from Stand;Stand Pivot Transfers Sit to Stand: Min guard Stand pivot transfers: Min guard  General transfer comment: posterior weight in heels, improve with cues to shift into balls of feet; min guard to steady with pivoting over to [facility] and cues for hand placement to improve safety  Ambulation/Gait Ambulation/Gait assistance: Min assist Gait Distance (Feet): 10 Feet Assistive device: 1 person hand held assist Gait Pattern/deviations: Step-to pattern;Decreased stride length;Staggering left;Staggering right Gait velocity: decreased  General Gait Details: slow, staggering steps in room with single HHA, min A to steady, 1 loss of balance requiring min A to recover and continue ambulation  Stairs            Wheelchair Mobility    Modified Rankin (Stroke Patients Only)       Balance Overall balance assessment: Needs assistance Sitting-balance support: Feet supported;No upper extremity supported Sitting balance-Leahy Scale: Good Sitting balance - Comments: seated EOB   Standing balance support: During functional activity;Single extremity supported Standing balance-Leahy Scale: Poor Standing balance comment: reliant on HHA/min A for steadying            Pertinent  Vitals/Pain Pain Assessment: No/denies pain    Home Living Family/patient expects to be discharged to:: Private residence Living Arrangements: Spouse/significant other Available Help at Discharge: Family;Available 24 hours/day Type of Home: House Home Access: Stairs to enter Entrance Stairs-Rails:  Right Entrance Stairs-Number of Steps: 2 Home Layout: One level        Prior Function Level of Independence: Independent         Comments: Community ambulator without AD, ind with ADLs, brother provides transportation, 1 fall recently due to weakness     Hand Dominance        Extremity/Trunk Assessment   Upper Extremity Assessment Upper Extremity Assessment: Overall WFL for tasks assessed    Lower Extremity Assessment Lower Extremity Assessment: Overall WFL for tasks assessed (AROM WNL, strength 4/5)    Cervical / Trunk Assessment Cervical / Trunk Assessment: Normal  Communication   Communication: No difficulties  Cognition Arousal/Alertness: Awake/alert Behavior During Therapy: Flat affect Overall Cognitive Status: No family/caregiver present to determine baseline cognitive functioning  General Comments: Pt repsonds with 1-2 word responses, very flat affect, slow to follow commands      General Comments      Exercises     Assessment/Plan    PT Assessment Patient needs continued PT services  PT Problem List Decreased activity tolerance;Decreased balance;Decreased cognition;Decreased knowledge of use of DME;Decreased safety awareness       PT Treatment Interventions DME instruction;Gait training;Stair training;Functional mobility training;Therapeutic activities;Therapeutic exercise;Balance training;Neuromuscular re-education;Patient/family education    PT Goals (Current goals can be found in the Care Plan section)  Acute Rehab PT Goals Patient Stated Goal: go home PT Goal Formulation: With patient Time For Goal Achievement: 03/06/20 Potential to Achieve Goals: Good    Frequency Min 3X/week   Barriers to discharge        Co-evaluation               AM-PAC PT "6 Clicks" Mobility  Outcome Measure Help needed turning from your back to your side while in a flat bed without using bedrails?: None Help needed moving from lying on your back to  sitting on the side of a flat bed without using bedrails?: None Help needed moving to and from a bed to a chair (including a wheelchair)?: A Little Help needed standing up from a chair using your arms (e.g., wheelchair or bedside chair)?: A Little Help needed to walk in hospital room?: A Little Help needed climbing 3-5 steps with a railing? : A Little 6 Click Score: 20    End of Session   Activity Tolerance: Patient tolerated treatment well Patient left: in bed;with call bell/phone within reach;with nursing/sitter in room Nurse Communication: Mobility status PT Visit Diagnosis: Unsteadiness on feet (R26.81);Other abnormalities of gait and mobility (R26.89)    Time: 1610-9604 PT Time Calculation (min) (ACUTE ONLY): 21 min   Charges:   PT Evaluation $PT Eval Low Complexity: 1 Low           Tori Kobey Sides PT, DPT 02/28/20, 12:19 PM [phone]

## 2020-02-28 NOTE — TOC Initial Note (Addendum)
Transition of Care [facility]) - Initial/Assessment Note    Patient Details  Name: Judy Davis MRN: [id number] Date of Birth: 11/19/1955  Transition of Care [facility]) CM/SW Contact:    Salome Arnt, LCSW Phone Number: 02/28/2020, 3:18 PM  Clinical Narrative:   LCSW met with pt and pt's husband at bedside. Pt states she lives with husband and is independent with ADLs at baseline. Her siblings provide transportation as needed. [facility] is [facility] and husband indicates pt has appointment tomorrow.  He asked that LCSW call and cancel. LCSW canceled appointment. PCP list provided to pt. PT evaluated pt today and recommend home health and rolling walker. Pt agreeable. Will refer for HHPT. Barbaraann Rondo with [employer] notified of need for RW. Discussed substance use. Pt admits to drinking Vodka every other day, but is vague about amount. She admits this is a problem for her and wants to stop. Pt has been sober for 6 months in the past year. She has never been to outpatient treatment. Pt does not feel this is necessary and feels she can stop on her own, but did accept outpatient referrals. Will continue to follow.             Expected Discharge Plan: [place] Barriers to Discharge: Continued Medical Work up   Patient Goals and CMS Choice Patient states their goals for this hospitalization and ongoing recovery are:: return home      Expected Discharge Plan and Services Expected Discharge Plan: [place] In-house Referral: Clinical Social Work   Post Acute Care Choice: [place] arrangements for the past 2 months: [place]                                      Prior Living Arrangements/Services Living arrangements for the past 2 months: Single Family Home Lives with:: Spouse Patient language and need for interpreter reviewed:: Yes Do you feel safe going back to the place where you live?: Yes      Need for Family  Participation in Patient Care: Yes (Comment) Care giver support system in place?: Yes (comment)      Activities of Daily Living Home Assistive Devices/Equipment: Eyeglasses ADL Screening (condition at time of admission) Patient's cognitive ability adequate to safely complete daily activities?: Yes Is the patient deaf or have difficulty hearing?: No Does the patient have difficulty seeing, even when wearing glasses/contacts?: No Does the patient have difficulty concentrating, remembering, or making decisions?: No Patient able to express need for assistance with ADLs?: Yes Does the patient have difficulty dressing or bathing?: No Independently performs ADLs?: Yes (appropriate for developmental age) Does the patient have difficulty walking or climbing stairs?: No Weakness of Legs: None Weakness of Arms/Hands: None  Permission Sought/Granted Permission sought to share information with : Other (comment)       Permission granted to share info w AGENCY: Home health        Emotional Assessment Appearance:: Appears stated age Attitude/Demeanor/Rapport: Other (comment) (Appropriate) Affect (typically observed): Accepting Orientation: : Oriented to Self, Oriented to Place, Oriented to Situation Alcohol / Substance Use: Alcohol Use    Admission diagnosis:  Dehydration [E86.0] Lactic acidosis [E99.3] Alcoholic ketoacidosis [Z16.9] Hyponatremia [E87.1] Chloride, decreased level [E87.8] Acute kidney injury ([place]) [N17.9] Severe sepsis ([place]) [A41.9, R65.20] Urinary tract infection without hematuria, site unspecified [N39.0] Patient Active Problem List   Diagnosis  Date Noted  . Severe Vitamin D deficiency 02/28/2020  . Severe sepsis ([place]) 02/27/2020  . Lactic acidosis 02/27/2020  . Generalized abdominal pain 02/27/2020  . GERD (gastroesophageal reflux disease) 02/27/2020  . Altered mental state 02/27/2020  . Sinus tachycardia 02/27/2020  . Acute on chronic pancreatitis ([place])  02/27/2020  . Pancreatic mass 02/27/2020  . Pancreatic pseudocyst 02/27/2020  . Gait instability 08/10/2019  . Alcohol abuse 08/08/2019  . Intractable nausea and vomiting 08/08/2019  . Intractable vomiting 08/07/2019  . Hematoma 09/29/2016  . Elevated AST (SGOT) 09/29/2016  . Hyperglycemia 09/29/2016  . Hypokalemia 09/29/2016  . Upper respiratory infection 07/06/2011  . Lower urinary tract infectious disease 07/06/2011  . Nausea and vomiting 07/05/2011  . Hyponatremia 07/05/2011  . HTN (hypertension) 07/05/2011  . Anemia 07/05/2011   PCP:  Health, [place]:   [facility] [address] [place] - [place] [address] [place] [address] [place] [address] Phone: [phone] Fax: [phone]  [facility] [phone] - [place], [place] AT [place] [address] [place] [address] Phone: [phone] Fax: [phone]     Social Determinants of Health (SDOH) Interventions    Readmission Risk Interventions No flowsheet data found.

## 2020-02-28 NOTE — Progress Notes (Signed)
Initial Nutrition Assessment  DOCUMENTATION CODES:   Non-severe (moderate) malnutrition in context of chronic illness, Underweight  INTERVENTION:  Boost Breeze po TID, each supplement provides 250 kcal and 9 grams of protein  Advance diet as medically feasible   Monitor magnesium, potassium, and phosphorus daily for at least 3 days, MD to replete as needed, as pt is at risk for refeeding syndrome given history of alcohol use, reported several day history of poor oral intake.  NUTRITION DIAGNOSIS:   Moderate Malnutrition related to chronic illness (chronic pancreatitis) as evidenced by moderate fat depletion, mild fat depletion, moderate muscle depletion, severe muscle depletion.  GOAL:   Patient will meet greater than or equal to 90% of their needs  MONITOR:   PO intake, Supplement acceptance, Weight trends, Labs, Diet advancement  REASON FOR ASSESSMENT:   Consult Assessment of nutrition requirement/status  ASSESSMENT:  64 year old female admitted for severe sepsis secondary to presumed UTI and acute on chronic pancreatitis with pseudocyst after presenting to ED with complaints of poor oral intake, abdominal pain, and weakness over the past several days. Past medical history significant of HTN, chronic alcoholism, history of esophagitis and gastritis.  Patient curled on her side resting this afternoon, she denies abdominal pain, reports feeling hungry. Patient reports having a good appetite at home, denies decreased po intake. She reports usually eating 3 meals/day, recalls oatmeal with fat back and a biscuit for lunch, states that her husband prepares lunch and dinner meals, recalls more biscuits, beans, and peaches. She denies any weight loss, but does endorse that her clothes have gotten loose. Moderate to severe fat and muscle depletions noted on exam, meeting criteria for moderate malnutrition in the context of chronic illness. Patient's diet advanced to clears, amenable to  trying Boost Breeze supplement. Will continue to monitor for diet advancement and provide additional supplements as appropriate. Patient reports needing to get up to use the restroom, RD stopped at nursing station, patient's RN aware.   Current wt 107.8 lb UBW 147 lb per pt a long time ago Limited weight recent wt history for review, on 08/07/19 pt weighed 89.98 lb however on 08/03/19 pt weighed 119.68 lb ? stated. Patient noted with fairly stable 119-134 lb from 2012-2019.   Per notes: -MRI for further evaluation of pancreatic mass -acute pancreatitis clinically improving, diet advanced to CL -alcohol level <10 on admission -Hgb trending down, likely dilutional, no overt GI bleeding -WBC up, suspected likely r/t UTI  Medications reviewed and include: Folic acid, MVI, B1, Ativan, Protonix IVF: NaCl IVPB: Rocephin, Flagyl, Mg sulfate, KCl  Labs: Na 129 (L), K 3.1 (L), WBC 19.6 (H), Hgb 8.7 (L), Vit D 6.67 (L) Mg/P- WNL  NUTRITION - FOCUSED PHYSICAL EXAM:    Most Recent Value  Orbital Region Moderate depletion  Upper Arm Region Severe depletion  Thoracic and Lumbar Region Unable to assess  Buccal Region Mild depletion  Temple Region Moderate depletion  Clavicle Bone Region Moderate depletion  Clavicle and Acromion Bone Region Moderate depletion  Scapular Bone Region Unable to assess  Dorsal Hand Severe depletion  Patellar Region Severe depletion  Anterior Thigh Region Moderate depletion  Posterior Calf Region Moderate depletion  Edema (RD Assessment) None  Hair Unable to assess  [wearing a cap]  Eyes Reviewed  Mouth Reviewed  [poor dentition]  Skin Reviewed  Nails Reviewed       Diet Order:   Diet Order            Diet clear  liquid Room service appropriate? Yes; Fluid consistency: Thin  Diet effective now                 EDUCATION NEEDS:   No education needs have been identified at this time  Skin:  Skin Assessment: Reviewed RN Assessment  Last BM:  6/14 type  7  Height:   Ht Readings from Last 1 Encounters:  02/27/20 5\' 6"  (1.676 m)    Weight:   Wt Readings from Last 1 Encounters:  02/28/20 49 kg    Ideal Body Weight:  59.1 kg  BMI:  Body mass index is 17.44 kg/m.  Estimated Nutritional Needs:   Kcal:  1500-1700  Protein:  75-85  Fluid:  >/= 1.2 L/day   Lajuan Lines, RD, LDN Clinical Nutrition After Hours/Weekend Pager # in [place]

## 2020-02-28 NOTE — Progress Notes (Addendum)
PROGRESS NOTE   Judy Davis  VOJ:[id number] DOB: 29-Aug-1956 DOA: 02/27/2020 PCP: [facility], [facility]   Chief Complaint  Patient presents with  . Weakness    Brief Admission History:  64 y.o. female with history of hypertension and chronic alcoholism was sent to the ED by family members when her brother noted that the past several days she has had poor oral intake and has been complaining of abdominal pain.  She is not eating or drinking well.  She has been weak and frail and not ambulating.  Lying in the bed most of the time.  She denies fever and chills.  She is an alcohol consumer but reports that she only drinks several days out of the week and has never had delirium tremens.  She denies having weight loss.  She denies diarrhea.  She denies chest pain and shortness of breath.  Her abdominal pain seems to be worsened with eating and that is why she has not been eating that much.  She describes her pain as sharp radiating into the back with burning and constantly aching.  She had an EGD by Dr. Oneida Alar in November 2020 with findings of grade C esophagitis and gastritis.  H. pylori was negative.  Assessment & Plan:   Principal Problem:   Severe sepsis ([place]) Active Problems:   Hyponatremia   HTN (hypertension)   Lower urinary tract infectious disease   Alcohol abuse   Lactic acidosis   Generalized abdominal pain   GERD (gastroesophageal reflux disease)   Altered mental state   Sinus tachycardia   Acute on chronic pancreatitis (HCC)   Pancreatic mass   Pancreatic pseudocyst   Severe Vitamin D deficiency   1. Acute on chronic pancreatitis with pseudocyst-patient admitted for supportive care, IV fluids, bowel rest, IV pain and nausea medications ordered as needed.  IV Protonix ordered for GI protection. 2. Severe sepsis-secondary to presumed UTI, treating with IV antibiotics as ordered and supportive care. Elevated lactic acid levels have normalized.  3. Lactic  acidosis-Resolved now, treated with IV fluid hydration and antibiotics.  4. Sinus tachycardia-improving, treated supportively. 5. Question of Pancreatic mass-considering MRI for further evaluation, GI consultation requested. 6. Chronic alcoholism-patient reports only mild to moderate consumption however history has not been consistent, she has been placed on a CIWA alcohol withdrawal protocol and started on vitamin supplements. 7. Urinary tract infection-follow urine culture continue IV ceftriaxone for now. 8. Hyponatremia-improving, likely secondary to severe dehydration which we are treating with normal saline solution. 9. Hypokalemia - IV replacement ordered, Magnesium supplemented as well.  10. Hypertension-follow blood pressures and treat as needed. 11. Metabolic encephalopathy / Altered mentation likely multifactorial- continue to follow. 12. Severe vitamin D deficiency - vitamin D supplement ordered.   DVT prophylaxis:  lovenox Code Status: Full  Family Communication: update brother, sister at bedside Disposition: TBD  Status is: Inpatient  Remains inpatient appropriate because:Persistent severe electrolyte disturbances, Altered mental status, Unsafe d/c plan, IV treatments appropriate due to intensity of illness or inability to take PO and Inpatient level of care appropriate due to severity of illness  Dispo: The patient is from: Home              Anticipated d/c is to: TBD              Anticipated d/c date is: 3 days              Patient currently is not medically stable to d/c.  Consultants:  GI  Procedures:     Antimicrobials:     Subjective: Pt continues to complain of a nagging epigastric abdominal pain that is constant.   Objective: Vitals:   02/28/20 0500 02/28/20 0600 02/28/20 0700 02/28/20 0809  BP: 137/68 (!) 145/72 119/64   Pulse: 70 71 68 67  Resp: 18 18 16  (!) 22  Temp:    97.9 F (36.6 C)  TempSrc:    Oral  SpO2: 100% 100% 100% 100%  Weight: 49  kg     Height:        Intake/Output Summary (Last 24 hours) at 02/28/2020 0918 Last data filed at 02/28/2020 0745 Gross per 24 hour  Intake 2865.02 ml  Output 552 ml  Net 2313.02 ml   Filed Weights   02/27/20 0437 02/27/20 1500 02/28/20 0500  Weight: 41 kg 49 kg 49 kg    Examination:  General exam: emaciated chronically ill appearing female, Appears weak.  Able to answer questions.   Respiratory system: Clear to auscultation. Respiratory effort normal. Cardiovascular system: S1 & S2 heard, RRR. No JVD, murmurs, rubs, gallops or clicks. No pedal edema. Gastrointestinal system: Abdomen is nondistended, soft and with persistent mild epigastric tenderness with light palpation.  Mild guarding of the epigastrium.   No organomegaly or masses felt. Normal bowel sounds heard. Central nervous system: Alert and oriented. No focal neurological deficits. Extremities: Symmetric 5 x 5 power. Skin: No rashes, lesions or ulcers Psychiatry: Judgement and insight appear normal. Mood & affect appropriate.   Data Reviewed: I have personally reviewed following labs and imaging studies  CBC: Recent Labs  Lab 02/27/20 0447 02/28/20 0406  WBC 8.4 19.6*  NEUTROABS 7.9* 17.2*  HGB 10.2* 8.7*  HCT 31.6* 27.4*  MCV 100.0 101.5*  PLT 308 433    Basic Metabolic Panel: Recent Labs  Lab 02/27/20 0447 02/27/20 0608 02/27/20 1515 02/28/20 0406  NA 122*  --   --  129*  K 4.0  --   --  3.1*  CL 87*  --   --  100  CO2 18*  --   --  23  GLUCOSE 133*  --   --  80  BUN 13  --   --  8  CREATININE 1.47*  --   --  0.60  CALCIUM 8.8*  --   --  8.1*  MG  --  1.8 1.7 1.8  PHOS  --   --  3.1  --     GFR: Estimated Creatinine Clearance: 54.2 mL/min (by C-G formula based on SCr of 0.6 mg/dL).  Liver Function Tests: Recent Labs  Lab 02/27/20 0447 02/28/20 0406  AST 30 32  ALT 20 16  ALKPHOS 113 69  BILITOT 1.1 0.6  PROT 7.4 6.0*  ALBUMIN 2.8* 2.3*    CBG: No results for input(s): GLUCAP in  the last 168 hours.  Recent Results (from the past 240 hour(s))  Urine culture     Status: Abnormal (Preliminary result)   Collection Time: 02/27/20  7:27 AM   Specimen: Urine, Clean Catch  Result Value Ref Range Status   Specimen Description   Final    URINE, CLEAN CATCH Performed at [facility], [address], [place], [place] [address]    Special Requests   Final    NONE Performed at [facility], [address] [place], [place] [address]    Culture (A)  Final    >=100,000 COLONIES/mL GRAM NEGATIVE RODS SUSCEPTIBILITIES TO FOLLOW Performed at [facility], [place] [address], [place], [place] [address]    Report Status PENDING  Incomplete  Culture, blood (Routine X 2) w Reflex to ID Panel     Status: None (Preliminary result)   Collection Time: 02/27/20  8:07 AM   Specimen: BLOOD LEFT HAND  Result Value Ref Range Status   Specimen Description BLOOD LEFT HAND  Final   Special Requests   Final    Blood Culture results may not be optimal due to an inadequate volume of blood received in culture bottles BOTTLES DRAWN AEROBIC AND ANAEROBIC   Culture   Final    NO GROWTH < 24 HOURS Performed at [facility], [address], [place], [place] [address]    Report Status PENDING  Incomplete  Culture, blood (Routine X 2) w Reflex to ID Panel     Status: None (Preliminary result)   Collection Time: 02/27/20  8:18 AM   Specimen: BLOOD RIGHT HAND  Result Value Ref Range Status   Specimen Description BLOOD RIGHT HAND  Final   Special Requests   Final    Blood Culture results may not be optimal due to an inadequate volume of blood received in culture bottles BOTTLES DRAWN AEROBIC ONLY   Culture   Final    NO GROWTH < 24 HOURS Performed at [facility], [address] [place], [place] [address]    Report Status PENDING  Incomplete  SARS Coronavirus 2 by RT PCR (hospital order, performed in [facility] hospital lab) Nasopharyngeal     Status: None   Collection Time: 02/27/20  8:24 AM     Specimen: Nasopharyngeal  Result Value Ref Range Status   SARS Coronavirus 2 NEGATIVE NEGATIVE Final    Comment: (NOTE) SARS-CoV-2 target nucleic acids are NOT DETECTED.  The SARS-CoV-2 RNA is generally detectable in upper and lower respiratory specimens during the acute phase of infection. The lowest concentration of SARS-CoV-2 viral copies this assay can detect is 250 copies / mL. A negative result does not preclude SARS-CoV-2 infection and should not be used as the sole basis for treatment or other patient management decisions.  A negative result may occur with improper specimen collection / handling, submission of specimen other than nasopharyngeal swab, presence of viral mutation(s) within the areas targeted by this assay, and inadequate number of viral copies (<250 copies / mL). A negative result must be combined with clinical observations, patient history, and epidemiological information.  Fact Sheet for Patients:   StrictlyIdeas.no  Fact Sheet for Healthcare Providers: BankingDealers.co.za  This test is not yet approved or  cleared by the [place] FDA and has been authorized for detection and/or diagnosis of SARS-CoV-2 by FDA under an Emergency Use Authorization (EUA).  This EUA will remain in effect (meaning this test can be used) for the duration of the COVID-19 declaration under Section 564(b)(1) of the Act, 21 U.S.C. section 360bbb-3(b)(1), unless the authorization is terminated or revoked sooner.  Performed at [facility], [address] [place], [place] [address]   MRSA PCR Screening     Status: None   Collection Time: 02/27/20  8:24 AM   Specimen: Nasopharyngeal  Result Value Ref Range Status   MRSA by PCR NEGATIVE NEGATIVE Final    Comment:        The GeneXpert MRSA Assay (FDA approved for NASAL specimens only), is one component of a comprehensive MRSA colonization surveillance program. It is  not intended to diagnose MRSA infection nor to guide or monitor treatment  for MRSA infections. Performed at [facility], [address] [place], [place] [address]   C Difficile Quick Screen w PCR reflex     Status: None   Collection Time: 02/27/20  3:28 PM   Specimen: STOOL  Result Value Ref Range Status   C Diff antigen NEGATIVE NEGATIVE Final   C Diff toxin NEGATIVE NEGATIVE Final   C Diff interpretation No C. difficile detected.  Final    Comment: Performed at [facility], [address] [place],  [address]     Radiology Studies: CT Abdomen Pelvis Wo Contrast  Result Date: 02/27/2020 CLINICAL DATA:  Pain with nausea and vomiting EXAM: CT ABDOMEN AND PELVIS WITHOUT CONTRAST TECHNIQUE: Multidetector CT imaging of the abdomen and pelvis was performed following the standard protocol without IV contrast. Oral contrast was administered. COMPARISON:  August 07, 2019 FINDINGS: Lower chest: There is atelectatic change in the lung bases. There is no lung base edema or airspace opacity. There is coronary artery calcification. Hepatobiliary: There is a cyst in the posterior segment of the right lobe of the liver measuring 3.5 x 3.3 cm, stable. There is mild fatty infiltration near the fissure for the ligamentum teres. No other liver lesions are evident on this noncontrast enhanced study. The gallbladder wall is not appreciably thickened. There is no biliary duct dilatation. Pancreas: The pancreas appears enlarged in the head and body regions with the suggestion of pancreatic edema. There is a somewhat ill-defined mass in the proximal body of the pancreas measuring 3.2 x 3.2 cm which has attenuation values marginally greater than is expected with a simple cyst. There are foci of pancreatic calcification which is stable consistent with chronic pancreatitis. There is mild peripancreatic fluid which extends to the level of the second portion of the duodenum. Spleen: No splenic lesions are evident.  Adrenals/Urinary Tract: There is adrenal hypertrophy bilaterally. Adrenals appear stable compared to prior study. There is a 7 x 7 mm focus of increased attenuation in the lower pole of the left kidney, unchanged from previous study and likely representing a small hyperdense cyst, stable. 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 There is no hydronephrosis on either side. No appreciable renal or ureteral calculus evident on either side. Urinary bladder is midline with wall thickness within normal limits. Stomach/Bowel: There is no appreciable bowel wall or mesenteric thickening. The terminal ileum appears normal. There is mild fatty infiltration in the ileocecal valve. There is no evident bowel obstruction. There is no free air or portal venous air. Vascular/Lymphatic: There is no abdominal aortic aneurysm. There is aortic atherosclerosis. There is also scattered calcification in each common iliac artery. There is no evident adenopathy in the abdomen or pelvis. Reproductive: Uterus is absent.  No pelvic mass evident. Other: No periappendiceal region inflammation. No abscess or ascites is evident in the abdomen or pelvis. There is fat in the umbilicus. Musculoskeletal: There is degenerative change in the lumbar spine. There is degenerative change in each hip joint. No blastic or lytic bone lesions. No intramuscular lesions are evident. IMPRESSION: 1. Edema in the midportion of the pancreas with a mass in the proximal body of the pancreas measuring 3.2 x 3.2 cm, a likely mildly complicated pseudocyst. There is slight peripancreatic fluid. Suspect acute pancreatitis. Calcification noted in the pancreas consistent with underlying chronic pancreatitis as well. With respect to mass in the body of the pancreas, further evaluation with dedicated MR or CT pre and post-contrast to further evaluate the pancreas is felt to be warranted. The pancreatic  duct is not appreciably dilated. 2. Probable hyperdense cyst in the left kidney, stable,  measuring 7 mm. Left kidney malrotated. No hydronephrosis. No renal or ureteral calculus on either side. Urinary bladder wall thickness normal. 3.  No bowel obstruction.  No abscess in the abdomen or pelvis. 4. Aortic Atherosclerosis (ICD10-I70.0). Foci of coronary artery calcification and iliac artery calcification also noted. 5.  Uterus absent. Electronically Signed   By: Lowella Grip III M.D.   On: 02/27/2020 08:54   Portable chest 1 View  Result Date: 02/28/2020 CLINICAL DATA:  Severe sepsis EXAM: PORTABLE CHEST 1 VIEW COMPARISON:  Yesterday FINDINGS: Normal heart size and mediastinal contours. Mild scarring or atelectasis. No acute infiltrate or edema. No effusion or pneumothorax. No acute osseous findings. IMPRESSION: No active disease. Electronically Signed   By: Monte Fantasia M.D.   On: 02/28/2020 06:33   DG Chest Port 1 View  Result Date: 02/27/2020 CLINICAL DATA:  Chest and abdominal pain EXAM: PORTABLE CHEST 1 VIEW COMPARISON:  Chest radiograph January 10, 2012 FINDINGS: There is slight atelectatic change in the bases. Lungs otherwise are clear. Heart size and pulmonary vascularity are normal. No adenopathy. There is aortic atherosclerosis. No bone lesions. IMPRESSION: Mild bibasilar atelectasis. Lungs otherwise clear. Cardiac silhouette normal. Aortic Atherosclerosis (ICD10-I70.0). Electronically Signed   By: Lowella Grip III M.D.   On: 02/27/2020 08:29   Scheduled Meds: . Chlorhexidine Gluconate Cloth  6 each Topical Daily  . enoxaparin (LOVENOX) injection  30 mg Subcutaneous Q24H  . folic acid  1 mg Oral Daily  . LORazepam  0-4 mg Intravenous Q6H   Followed by  . [START ON 02/29/2020] LORazepam  0-4 mg Intravenous Q12H  . multivitamin with minerals  1 tablet Oral Daily  . pantoprazole (PROTONIX) IV  40 mg Intravenous Q24H  . thiamine  100 mg Oral Daily   Or  . thiamine  100 mg Intravenous Daily  . [START ON 03/01/2020] Vitamin D (Ergocalciferol)  50,000 Units Oral Q7 days     Continuous Infusions: . sodium chloride 100 mL/hr at 02/28/20 0745  . cefTRIAXone (ROCEPHIN)  IV    . magnesium sulfate bolus IVPB 2 g (02/28/20 0911)  . metronidazole 500 mg (02/28/20 0603)  . potassium chloride 10 mEq (02/28/20 0912)     LOS: 1 day   Critical Care Procedure Note Authorized and Performed by: Murvin Natal MD  Total Critical Care time:  34 mins  Due to a high probability of clinically significant, life threatening deterioration, the patient required my highest level of preparedness to intervene emergently and I personally spent this critical care time directly and personally managing the patient.  This critical care time included obtaining a history; examining the patient, pulse oximetry; ordering and review of studies; arranging urgent treatment with development of a management plan; evaluation of patient's response of treatment; frequent reassessment; and discussions with other providers.  This critical care time was performed to assess and manage the high probability of imminent and life threatening deterioration that could result in multi-organ failure.  It was exclusive of separately billable procedures and treating other patients and teaching time.   Irwin Brakeman, MD How to contact the [facility] Attending or Consulting provider [place] or covering provider during after hours [place], for this patient?  1. Check the care team in [facility] and look for a) attending/consulting [facility] provider listed and b) the [facility] team listed 2. Log into [email or web address] and use [place]'s universal password to access. If  you do not have the password, please contact the hospital operator. 3. Locate the [facility] provider you are looking for under [facility] and page to a number that you can be directly reached. 4. If you still have difficulty reaching the provider, please page the [facility] (Director on Call) for the Hospitalists listed on amion for assistance.  02/28/2020, 9:18 AM

## 2020-02-28 NOTE — Consult Note (Addendum)
Referring Provider: [facility] Primary Care Physician:  [facility] Primary Gastroenterologist:  Dr. Eden Lathe (Dr. Gala Romney following for now)  Date of Admission: 02/27/20 Date of Consultation: 02/28/20  Reason for Consultation:  Pancreatic lesion  HPI:  Judy Davis is a 64 y.o. year old female with history of HTN and chronic alcohol abuse that was sent to the ED via EMS by family members when they noticed patient had poor oral intake over the past several days and has been complaining of sharp abdominal pain that worsened with eating, radiating to her back.  In the ED, patient was found to have a heart rate of 144, respiration 27, BP 124/72. Sodium 122, potassium 4.0, glucose 133, BUN 13, creatinine 1.47, albumin 2.8, LFTs within normal limits, hemoglobin 10.2, hematocrit 31.6, platelet count 308,  CK 26, lactic acid 5.5,UA with many bacteria, alcohol level less than 10.  Lipase 112.  CT A/P with acute on chronic pancreatitis with pseudocyst, no bowel obstruction or abscesses.  UDS negative for recreational substances.    She was subsequently admitted.  Started on IV ceftriaxone for UTI.  For pancreatitis, she was started on IV fluids, IV pain and nausea medications, IV Protonix, and placed on n.p.o. status.  GI was consulted due to pancreatic mass noted on MRI which is suspected to be a mildly complicated pseudocyst with need for MRI to evaluate further.  Labs today with BUN down to 8, creatinine improved to 0.6, sodium improved to 129, potassium low at 3.1.  Hemoglobin down to 8.7, hematocrit down to 27.4, WBC up to 19.6.  Lipase down to 63.  INR 1.8 (she did get lovenox yesterday).  Today: Patient is a poor historian. States she has not been feeling well for last few days.  She thought she would get better but she did not, so she came to the emergency room.  She has had some decrease in appetite but then reports she is eating well.  Denies nausea vomiting.  Admits  to mild abdominal pain across lower abdomen x1.5 weeks.  Some worsening with eating but then it gets better. Denies any urinary symptoms including frequency, dysuria, or hematuria.  Lives with her husband. States she stays in the bed at home and her sister comes to take care of her. Sister tells her not to get out of bed because she may fall. Denies GERD symptoms or dysphagia.  1-2 BMs daily typically soft-mushy.  No BRBPR or melena.  Denies any history of pancreatitis. Reports drinking about 1 beer daily. No NSAIDs. Reports father may have had history of pancreatic cancer.  Admits to weight loss but cannot tell me much.  Per chart review, appears weight seems fairly stable since November 2020, around 90 lbs.  128 lbs in July 2019.  Denies fever, chills, lightheadedness, dizziness, presyncope, syncope, chest pain, heart palpitations, shortness of breath, or cough.  Spoke with nursing staff.  States night shift reported loose stools which is why C. difficile was completed.  This was negative.  No BMs today.  No vomiting.  Mild nausea and Zofran was given.   Past Medical History:  Diagnosis Date  . ETOH abuse   . HTN (hypertension)     Past Surgical History:  Procedure Laterality Date  . ABDOMINAL HYSTERECTOMY  1970's  . ESOPHAGOGASTRODUODENOSCOPY (EGD) WITH PROPOFOL N/A 08/09/2019   Fields: LA Grade C esophagitis. gastritis. no h.pylori    Prior to Admission medications   Medication Sig Start Date End Date Taking? Authorizing  Provider  ondansetron (ZOFRAN ODT) 4 MG disintegrating tablet Take 1 tablet (4 mg total) by mouth every 8 (eight) hours as needed for nausea or vomiting. 08/03/19   Evalee Jefferson, PA-C  pantoprazole (PROTONIX) 40 MG tablet Take 1 tablet (40 mg total) by mouth 2 (two) times daily before a meal. 08/10/19   Orson Eva, MD    Current Facility-Administered Medications  Medication Dose Route Frequency Provider Last Rate Last Admin  . 0.9 %  sodium chloride infusion    Intravenous Continuous Irwin Brakeman L, MD 135 mL/hr at 02/28/20 0458 New Bag at 02/28/20 0458  . acetaminophen (TYLENOL) tablet 650 mg  650 mg Oral Q6H PRN Johnson, Clanford L, MD       Or  . acetaminophen (TYLENOL) suppository 650 mg  650 mg Rectal Q6H PRN Johnson, Clanford L, MD      . cefTRIAXone (ROCEPHIN) 2 g in sodium chloride 0.9 % 100 mL IVPB  2 g Intravenous Q24H Johnson, Clanford L, MD      . Chlorhexidine Gluconate Cloth 2 % PADS 6 each  6 each Topical Daily Wynetta Emery, Clanford L, MD   6 each at 02/27/20 1528  . dextromethorphan (DELSYM) 30 MG/5ML liquid 30 mg  30 mg Oral BID PRN Johnson, Clanford L, MD      . enoxaparin (LOVENOX) injection 30 mg  30 mg Subcutaneous Q24H Johnson, Clanford L, MD   30 mg at 02/27/20 1514  . folic acid (FOLVITE) tablet 1 mg  1 mg Oral Daily Johnson, Clanford L, MD   1 mg at 02/27/20 1516  . LORazepam (ATIVAN) injection 0-4 mg  0-4 mg Intravenous Q6H Johnson, Clanford L, MD   1 mg at 02/27/20 2039   Followed by  . [START ON 02/29/2020] LORazepam (ATIVAN) injection 0-4 mg  0-4 mg Intravenous Q12H Johnson, Clanford L, MD      . LORazepam (ATIVAN) tablet 1-4 mg  1-4 mg Oral Q1H PRN Murlean Iba, MD       Or  . LORazepam (ATIVAN) injection 1-4 mg  1-4 mg Intravenous Q1H PRN Johnson, Clanford L, MD      . magnesium sulfate IVPB 2 g 50 mL  2 g Intravenous Once Johnson, Clanford L, MD      . metroNIDAZOLE (FLAGYL) IVPB 500 mg  500 mg Intravenous Q8H Johnson, Clanford L, MD 100 mL/hr at 02/28/20 0603 500 mg at 02/28/20 0603  . multivitamin with minerals tablet 1 tablet  1 tablet Oral Daily Wynetta Emery, Clanford L, MD   1 tablet at 02/27/20 1516  . ondansetron (ZOFRAN) tablet 4 mg  4 mg Oral Q6H PRN Johnson, Clanford L, MD       Or  . ondansetron (ZOFRAN) injection 4 mg  4 mg Intravenous Q6H PRN Johnson, Clanford L, MD      . pantoprazole (PROTONIX) injection 40 mg  40 mg Intravenous Q24H Johnson, Clanford L, MD   40 mg at 02/27/20 1513  . polyethylene  glycol (MIRALAX / GLYCOLAX) packet 17 g  17 g Oral Daily PRN Johnson, Clanford L, MD      . potassium chloride 10 mEq in 100 mL IVPB  10 mEq Intravenous Q1 Hr x 4 Johnson, Clanford L, MD      . thiamine tablet 100 mg  100 mg Oral Daily Johnson, Clanford L, MD       Or  . thiamine (B-1) injection 100 mg  100 mg Intravenous Daily Murlean Iba, MD      . [  START ON 03/01/2020] Vitamin D (Ergocalciferol) (DRISDOL) capsule 50,000 Units  50,000 Units Oral Q7 days Murlean Iba, MD        Allergies as of 02/27/2020  . (No Known Allergies)    Family History  Problem Relation Age of Onset  . Other Mother        Homicide  . Colon cancer Neg Hx   . Colon polyps Neg Hx     Social History   Socioeconomic History  . Marital status: Married    Spouse name: Not on file  . Number of children: Not on file  . Years of education: Not on file  . Highest education level: Not on file  Occupational History  . Occupation: unemployed  Tobacco Use  . Smoking status: Never Smoker  . Smokeless tobacco: Never Used  Substance and Sexual Activity  . Alcohol use: Yes    Comment: 2-3 beers a day when she can afford it  . Drug use: No  . Sexual activity: Not on file  Other Topics Concern  . Not on file  Social History Narrative  . Not on file   Social Determinants of Health   Financial Resource Strain:   . Difficulty of Paying Living Expenses:   Food Insecurity:   . Worried About Charity fundraiser in the Last Year:   . Arboriculturist in the Last Year:   Transportation Needs:   . Film/video editor (Medical):   Marland Kitchen Lack of Transportation (Non-Medical):   Physical Activity:   . Days of Exercise per Week:   . Minutes of Exercise per Session:   Stress:   . Feeling of Stress :   Social Connections:   . Frequency of Communication with Friends and Family:   . Frequency of Social Gatherings with Friends and Family:   . Attends Religious Services:   . Active Member of Clubs or  Organizations:   . Attends Archivist Meetings:   Marland Kitchen Marital Status:   Intimate Partner Violence:   . Fear of Current or Ex-Partner:   . Emotionally Abused:   Marland Kitchen Physically Abused:   . Sexually Abused:     Review of Systems: Gen: See HPI CV: See HPI Resp: See HPI GI: See HPI GU : See HPI Psych: Denies depression, anxiety Heme: See HPI Physical Exam: Vital signs in last 24 hours: Temp:  [97.4 F (36.3 C)-98 F (36.7 C)] 97.6 F (36.4 C) (06/15 0400) Pulse Rate:  [70-114] 71 (06/15 0600) Resp:  [12-24] 18 (06/15 0600) BP: (101-145)/(57-77) 145/72 (06/15 0600) SpO2:  [97 %-100 %] 100 % (06/15 0600) Weight:  [49 kg] 49 kg (06/15 0500) Last BM Date: 02/27/20 General:   Alert, pleasant and cooperative in NAD, frail-appearing Head:  Normocephalic and atraumatic. Eyes:  Sclera clear, no icterus.   Conjunctiva pink. Ears:  Normal auditory acuity. Lungs:  Clear throughout to auscultation.   No wheezes, crackles, or rhonchi. No acute distress. Heart:  Regular rate and rhythm; no murmurs, clicks, rubs,  or gallops. Abdomen:  Soft and nondistended.  Mild tenderness to palpation lower abdomen, greatest in the suprapubic area.  No masses, hepatosplenomegaly or hernias noted. Normal bowel sounds, without guarding, and without rebound.   Rectal:  Deferred Msk:  Symmetrical without gross deformities.  Extremities:  Without edema. Neurologic:  Alert and  oriented x3;  grossly normal neurologically. Skin:  Intact without significant lesions or rashes. Psych:  Normal mood and affect.  Intake/Output from previous day:  06/14 0701 - 06/15 0700 In: 2450.1 [P.O.:50; I.V.:2000; IV Piggyback:400.1] Out: 552 [Urine:550; Stool:2] Intake/Output this shift: No intake/output data recorded.  Lab Results: Recent Labs    02/27/20 0447 02/28/20 0406  WBC 8.4 19.6*  HGB 10.2* 8.7*  HCT 31.6* 27.4*  PLT 308 298   BMET Recent Labs    02/27/20 0447 02/28/20 0406  NA 122* 129*  K 4.0  3.1*  CL 87* 100  CO2 18* 23  GLUCOSE 133* 80  BUN 13 8  CREATININE 1.47* 0.60  CALCIUM 8.8* 8.1*   LFT Recent Labs    02/27/20 0447 02/28/20 0406  PROT 7.4 6.0*  ALBUMIN 2.8* 2.3*  AST 30 32  ALT 20 16  ALKPHOS 113 69  BILITOT 1.1 0.6   PT/INR Recent Labs    02/28/20 0406  LABPROT 20.2*  INR 1.8*   Hepatitis Panel No results for input(s): HEPBSAG, HCVAB, HEPAIGM, HEPBIGM in the last 72 hours. C-Diff Recent Labs    02/27/20 1528  CDIFFTOX NEGATIVE    Studies/Results: CT Abdomen Pelvis Wo Contrast  Result Date: 02/27/2020 CLINICAL DATA:  Pain with nausea and vomiting EXAM: CT ABDOMEN AND PELVIS WITHOUT CONTRAST TECHNIQUE: Multidetector CT imaging of the abdomen and pelvis was performed following the standard protocol without IV contrast. Oral contrast was administered. COMPARISON:  August 07, 2019 FINDINGS: Lower chest: There is atelectatic change in the lung bases. There is no lung base edema or airspace opacity. There is coronary artery calcification. Hepatobiliary: There is a cyst in the posterior segment of the right lobe of the liver measuring 3.5 x 3.3 cm, stable. There is mild fatty infiltration near the fissure for the ligamentum teres. No other liver lesions are evident on this noncontrast enhanced study. The gallbladder wall is not appreciably thickened. There is no biliary duct dilatation. Pancreas: The pancreas appears enlarged in the head and body regions with the suggestion of pancreatic edema. There is a somewhat ill-defined mass in the proximal body of the pancreas measuring 3.2 x 3.2 cm which has attenuation values marginally greater than is expected with a simple cyst. There are foci of pancreatic calcification which is stable consistent with chronic pancreatitis. There is mild peripancreatic fluid which extends to the level of the second portion of the duodenum. Spleen: No splenic lesions are evident. Adrenals/Urinary Tract: There is adrenal hypertrophy  bilaterally. Adrenals appear stable compared to prior study. There is a 7 x 7 mm focus of increased attenuation in the lower pole of the left kidney, unchanged from previous study and likely representing a small hyperdense cyst, stable. 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 There is no hydronephrosis on either side. No appreciable renal or ureteral calculus evident on either side. Urinary bladder is midline with wall thickness within normal limits. Stomach/Bowel: There is no appreciable bowel wall or mesenteric thickening. The terminal ileum appears normal. There is mild fatty infiltration in the ileocecal valve. There is no evident bowel obstruction. There is no free air or portal venous air. Vascular/Lymphatic: There is no abdominal aortic aneurysm. There is aortic atherosclerosis. There is also scattered calcification in each common iliac artery. There is no evident adenopathy in the abdomen or pelvis. Reproductive: Uterus is absent.  No pelvic mass evident. Other: No periappendiceal region inflammation. No abscess or ascites is evident in the abdomen or pelvis. There is fat in the umbilicus. Musculoskeletal: There is degenerative change in the lumbar spine. There is degenerative change in each hip joint. No blastic or lytic bone lesions. No intramuscular  lesions are evident. IMPRESSION: 1. Edema in the midportion of the pancreas with a mass in the proximal body of the pancreas measuring 3.2 x 3.2 cm, a likely mildly complicated pseudocyst. There is slight peripancreatic fluid. Suspect acute pancreatitis. Calcification noted in the pancreas consistent with underlying chronic pancreatitis as well. With respect to mass in the body of the pancreas, further evaluation with dedicated MR or CT pre and post-contrast to further evaluate the pancreas is felt to be warranted. The pancreatic duct is not appreciably dilated. 2. Probable hyperdense cyst in the left kidney, stable, measuring 7 mm. Left kidney malrotated. No  hydronephrosis. No renal or ureteral calculus on either side. Urinary bladder wall thickness normal. 3.  No bowel obstruction.  No abscess in the abdomen or pelvis. 4. Aortic Atherosclerosis (ICD10-I70.0). Foci of coronary artery calcification and iliac artery calcification also noted. 5.  Uterus absent. Electronically Signed   By: Lowella Grip III M.D.   On: 02/27/2020 08:54   Portable chest 1 View  Result Date: 02/28/2020 CLINICAL DATA:  Severe sepsis EXAM: PORTABLE CHEST 1 VIEW COMPARISON:  Yesterday FINDINGS: Normal heart size and mediastinal contours. Mild scarring or atelectasis. No acute infiltrate or edema. No effusion or pneumothorax. No acute osseous findings. IMPRESSION: No active disease. Electronically Signed   By: Monte Fantasia M.D.   On: 02/28/2020 06:33   DG Chest Port 1 View  Result Date: 02/27/2020 CLINICAL DATA:  Chest and abdominal pain EXAM: PORTABLE CHEST 1 VIEW COMPARISON:  Chest radiograph January 10, 2012 FINDINGS: There is slight atelectatic change in the bases. Lungs otherwise are clear. Heart size and pulmonary vascularity are normal. No adenopathy. There is aortic atherosclerosis. No bone lesions. IMPRESSION: Mild bibasilar atelectasis. Lungs otherwise clear. Cardiac silhouette normal. Aortic Atherosclerosis (ICD10-I70.0). Electronically Signed   By: Lowella Grip III M.D.   On: 02/27/2020 08:29    Impression: 64 year old female with history of HTN and alcohol abuse who presented to the ED 6/14 via EMS after family member noticed patient had poor oral intake for the past several days and complaining of abdominal pain worsened by eating.  In the ED, heart rate of 144, respiration rate of 27, normotensive, afebrile.  She was hyponatremic with sodium 122, worsening kidney function with creatinine 1.47.  Lactic acid 5.5, UA with bacteria, lipase 122.  CT A/P with acute on chronic pancreatitis with mildly complex pseudocyst.  She was admitted with severe sepsis thought to  be secondary to UTI as well as acute on chronic pancreatitis.  Acute on chronic pancreatitis: Suspect this is alcohol induced although difficult to get a clear history on this and alcohol level was <10 on admission.  It is possible we are catching the tail end. Lipase mildly elevated at 112 admission.  BUN 13.  H/H 10.2/31.6.  LFTs within normal limits.  CT A/P with and without contrast suggested acute on chronic pancreatitis.  Also with ill-defined mass in the proximal body of the pancreas measuring 3.2 x 3.2 cm which has attenuation values marginally greater than expected with a simple cyst and suspected to a mildly complicated pseudocyst recommendation for MRI evaluate further.  No pancreatic or CBD ductal dilation.  She was made n.p.o. yesterday and started on IV fluids.  Lipase improved to 63 today.  BUN down to 8.  H/H down to 8.7/27.4.  No overt GI bleeding.  WBC up to 19.6 which I suspect is more likely related to UTI.  Patient denies classic symptoms of pancreatitis including upper abdominal  pain, nausea, vomiting.  She does report having 1-2 soft mushy stools daily which is likely secondary to chronic pancreatitis.  At this point, as patient has clinically improved/stable from an acute pancreatitis standpoint, I feel we can go ahead and try her on clear liquids to see how she tolerates this.  We will continue her IV fluids for now at current rate as we have past the 24 hour mark.   Pancreatic lesion: Likely a mildly complicated pseudocyst noted on CT A/P without contrast this admission.  This was not noted on prior CT in November 2020.  Patient denies any history of pancreatitis.  No significant upper abdominal pain or jaundice.  She does have weight loss and some decreased oral intake.  Also reports her father may have had history of pancreatic cancer.  I will order an MRI to evaluate the lesion further.  Anemia: Hemoglobin 10.2 on admission, down to 8.7 this morning.  Suspect this is likely  dilutional she has been receiving IV fluids due to acute pancreatitis/severe sepsis.  No overt GI bleeding.  Notably, she has had mild normocytic anemia dating back at least to January 2018.  No prior colonoscopy she will need this at some point in the future.  EGD 07/2019 with LA grade C esophagitis, gastritis, no H. Pylori.  We will plan to continue to monitor for now.  Plan: Advance to clear liquid diet. MRI/MRCP with and without contrast. Continue IV fluids. Continue Protonix daily. Continue Zofran as needed. Monitor for overt GI bleeding. Monitor H/H.      LOS: 1 day    02/28/2020, 7:32 AM   Aliene Altes, PA-C [facility]

## 2020-02-29 DIAGNOSIS — A409 Streptococcal sepsis, unspecified: Principal | ICD-10-CM

## 2020-02-29 DIAGNOSIS — K869 Disease of pancreas, unspecified: Secondary | ICD-10-CM

## 2020-02-29 DIAGNOSIS — R7881 Bacteremia: Secondary | ICD-10-CM

## 2020-02-29 DIAGNOSIS — D649 Anemia, unspecified: Secondary | ICD-10-CM

## 2020-02-29 DIAGNOSIS — F1010 Alcohol abuse, uncomplicated: Secondary | ICD-10-CM

## 2020-02-29 LAB — COMPREHENSIVE METABOLIC PANEL
ALT: 13 U/L (ref 0–44)
AST: 19 U/L (ref 15–41)
Albumin: 2.2 g/dL — ABNORMAL LOW (ref 3.5–5.0)
Alkaline Phosphatase: 61 U/L (ref 38–126)
Anion gap: 6 (ref 5–15)
BUN: <5 mg/dL — ABNORMAL LOW (ref 8–23)
CO2: 25 mmol/L (ref 22–32)
Calcium: 8 mg/dL — ABNORMAL LOW (ref 8.9–10.3)
Chloride: 96 mmol/L — ABNORMAL LOW (ref 98–111)
Creatinine, Ser: 0.52 mg/dL (ref 0.44–1.00)
GFR calc Af Amer: >60 mL/min (ref >60)
GFR calc non Af Amer: >60 mL/min (ref >60)
Glucose, Bld: 97 mg/dL (ref 70–99)
Potassium: 2.7 mmol/L — CL (ref 3.5–5.1)
Sodium: 127 mmol/L — ABNORMAL LOW (ref 135–145)
Total Bilirubin: 0.2 mg/dL — ABNORMAL LOW (ref 0.3–1.2)
Total Protein: 5.6 g/dL — ABNORMAL LOW (ref 6.5–8.1)

## 2020-02-29 LAB — CBC WITH DIFFERENTIAL/PLATELET
Abs Immature Granulocytes: 0.14 10*3/uL — ABNORMAL HIGH (ref 0.00–0.07)
Basophils Absolute: 0 10*3/uL (ref 0.0–0.1)
Basophils Relative: 0 %
Eosinophils Absolute: 0.1 10*3/uL (ref 0.0–0.5)
Eosinophils Relative: 0 %
HCT: 26.6 % — ABNORMAL LOW (ref 36.0–46.0)
Hemoglobin: 8.8 g/dL — ABNORMAL LOW (ref 12.0–15.0)
Immature Granulocytes: 1 %
Lymphocytes Relative: 11 %
Lymphs Abs: 1.4 10*3/uL (ref 0.7–4.0)
MCH: 32.5 pg (ref 26.0–34.0)
MCHC: 33.1 g/dL (ref 30.0–36.0)
MCV: 98.2 fL (ref 80.0–100.0)
Monocytes Absolute: 0.6 10*3/uL (ref 0.1–1.0)
Monocytes Relative: 5 %
Neutro Abs: 10.3 10*3/uL — ABNORMAL HIGH (ref 1.7–7.7)
Neutrophils Relative %: 83 %
Platelets: 312 10*3/uL (ref 150–400)
RBC: 2.71 MIL/uL — ABNORMAL LOW (ref 3.87–5.11)
RDW: 14.3 % (ref 11.5–15.5)
WBC: 12.5 10*3/uL — ABNORMAL HIGH (ref 4.0–10.5)
nRBC: 0 % (ref 0.0–0.2)

## 2020-02-29 LAB — LIPASE, BLOOD: Lipase: 102 U/L — ABNORMAL HIGH (ref 11–51)

## 2020-02-29 LAB — URINE CULTURE: Culture: >=100000 — AB

## 2020-02-29 LAB — MAGNESIUM: Magnesium: 1.7 mg/dL (ref 1.7–2.4)

## 2020-02-29 MED ORDER — POTASSIUM CHLORIDE CRYS ER 20 MEQ PO TBCR
40.0000 meq | EXTENDED_RELEASE_TABLET | Freq: Once | ORAL | Status: AC
  Administered 2020-02-29: 40 meq via ORAL
  Filled 2020-02-29: qty 2

## 2020-02-29 MED ORDER — POTASSIUM CHLORIDE CRYS ER 20 MEQ PO TBCR
20.0000 meq | EXTENDED_RELEASE_TABLET | Freq: Once | ORAL | Status: AC
  Administered 2020-02-29: 20 meq via ORAL
  Filled 2020-02-29: qty 1

## 2020-02-29 MED ORDER — POTASSIUM CHLORIDE 10 MEQ/100ML IV SOLN
10.0000 meq | INTRAVENOUS | Status: AC
  Administered 2020-02-29 (×2): 10 meq via INTRAVENOUS
  Filled 2020-02-29 (×2): qty 100

## 2020-02-29 MED ORDER — MAGNESIUM SULFATE 2 GM/50ML IV SOLN
2.0000 g | Freq: Once | INTRAVENOUS | Status: AC
  Administered 2020-02-29: 2 g via INTRAVENOUS
  Filled 2020-02-29: qty 50

## 2020-02-29 MED ORDER — POTASSIUM CHLORIDE IN NACL 20-0.9 MEQ/L-% IV SOLN: INTRAVENOUS | Status: DC

## 2020-02-29 NOTE — Progress Notes (Signed)
CRITICAL VALUE ALERT  Critical Value:  Potassium 2.7  Date & Time Notied:  02/29/20 2341  Provider Notified: Dr. Olevia Bowens   Orders Received/Actions taken: See chart

## 2020-02-29 NOTE — Progress Notes (Signed)
Subjective:  Feels weak. Abdominal pain somewhat improved. Had very little of her clear liquid tray. Did not necessarily make her pain worse.   Objective: Vital signs in last 24 hours: Temp:  [97.4 F (36.3 C)-98.2 F (36.8 C)] 98.2 F (36.8 C) (06/16 0400) Pulse Rate:  [63-84] 74 (06/16 0600) Resp:  [7-22] 14 (06/16 0600) BP: (99-147)/(52-89) 112/65 (06/16 0600) SpO2:  [99 %-100 %] 100 % (06/16 0600) Last BM Date: 02/29/20 General:  Frail appearing female in NAD. Appears older than stated age.  Head:  Normocephalic and atraumatic. Eyes:  Sclera clear, no icterus.  Abdomen:  Soft, moderate epigastric tenderness, nondistended. Normal bowel sounds, without guarding, and without rebound.   Extremities:  Without clubbing, deformity or edema. Neurologic:  Alert and  oriented x4;  grossly normal neurologically. Skin:  Intact without significant lesions or rashes. Psych:  Alert and cooperative. Normal mood and affect.  Intake/Output from previous day: 06/15 0701 - 06/16 0700 In: 3163.7 [P.O.:360; I.V.:1957.9; IV Piggyback:845.8] Out: 901 [Urine:901] Intake/Output this shift: No intake/output data recorded.  Lab Results: CBC Recent Labs    02/27/20 0447 02/28/20 0406 02/29/20 0438  WBC 8.4 19.6* 12.5*  HGB 10.2* 8.7* 8.8*  HCT 31.6* 27.4* 26.6*  MCV 100.0 101.5* 98.2  PLT 308 298 312   BMET Recent Labs    02/27/20 0447 02/28/20 0406 02/29/20 0438  NA 122* 129* 127*  K 4.0 3.1* 2.7*  CL 87* 100 96*  CO2 18* 23 25  GLUCOSE 133* 80 97  BUN 13 8 <5*  CREATININE 1.47* 0.60 0.52  CALCIUM 8.8* 8.1* 8.0*   LFTs Recent Labs    02/27/20 0447 02/28/20 0406 02/29/20 0438  BILITOT 1.1 0.6 0.2*  ALKPHOS 113 69 61  AST 30 32 19  ALT 20 16 13   PROT 7.4 6.0* 5.6*  ALBUMIN 2.8* 2.3* 2.2*   Recent Labs    02/27/20 0832 02/28/20 0406 02/29/20 0438  LIPASE 112* 63* 102*   PT/INR Recent Labs    02/28/20 0406  LABPROT 20.2*  INR 1.8*      Imaging Studies: CT  Abdomen Pelvis Wo Contrast  Result Date: 02/27/2020 CLINICAL DATA:  Pain with nausea and vomiting EXAM: CT ABDOMEN AND PELVIS WITHOUT CONTRAST TECHNIQUE: Multidetector CT imaging of the abdomen and pelvis was performed following the standard protocol without IV contrast. Oral contrast was administered. COMPARISON:  August 07, 2019 FINDINGS: Lower chest: There is atelectatic change in the lung bases. There is no lung base edema or airspace opacity. There is coronary artery calcification. Hepatobiliary: There is a cyst in the posterior segment of the right lobe of the liver measuring 3.5 x 3.3 cm, stable. There is mild fatty infiltration near the fissure for the ligamentum teres. No other liver lesions are evident on this noncontrast enhanced study. The gallbladder wall is not appreciably thickened. There is no biliary duct dilatation. Pancreas: The pancreas appears enlarged in the head and body regions with the suggestion of pancreatic edema. There is a somewhat ill-defined mass in the proximal body of the pancreas measuring 3.2 x 3.2 cm which has attenuation values marginally greater than is expected with a simple cyst. There are foci of pancreatic calcification which is stable consistent with chronic pancreatitis. There is mild peripancreatic fluid which extends to the level of the second portion of the duodenum. Spleen: No splenic lesions are evident. Adrenals/Urinary Tract: There is adrenal hypertrophy bilaterally. Adrenals appear stable compared to prior study. There is a 7 x 7 mm  focus of increased attenuation in the lower pole of the left kidney, unchanged from previous study and likely representing a small hyperdense cyst, stable. 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 There is no hydronephrosis on either side. No appreciable renal or ureteral calculus evident on either side. Urinary bladder is midline with wall thickness within normal limits. Stomach/Bowel: There is no appreciable bowel wall or mesenteric  thickening. The terminal ileum appears normal. There is mild fatty infiltration in the ileocecal valve. There is no evident bowel obstruction. There is no free air or portal venous air. Vascular/Lymphatic: There is no abdominal aortic aneurysm. There is aortic atherosclerosis. There is also scattered calcification in each common iliac artery. There is no evident adenopathy in the abdomen or pelvis. Reproductive: Uterus is absent.  No pelvic mass evident. Other: No periappendiceal region inflammation. No abscess or ascites is evident in the abdomen or pelvis. There is fat in the umbilicus. Musculoskeletal: There is degenerative change in the lumbar spine. There is degenerative change in each hip joint. No blastic or lytic bone lesions. No intramuscular lesions are evident. IMPRESSION: 1. Edema in the midportion of the pancreas with a mass in the proximal body of the pancreas measuring 3.2 x 3.2 cm, a likely mildly complicated pseudocyst. There is slight peripancreatic fluid. Suspect acute pancreatitis. Calcification noted in the pancreas consistent with underlying chronic pancreatitis as well. With respect to mass in the body of the pancreas, further evaluation with dedicated MR or CT pre and post-contrast to further evaluate the pancreas is felt to be warranted. The pancreatic duct is not appreciably dilated. 2. Probable hyperdense cyst in the left kidney, stable, measuring 7 mm. Left kidney malrotated. No hydronephrosis. No renal or ureteral calculus on either side. Urinary bladder wall thickness normal. 3.  No bowel obstruction.  No abscess in the abdomen or pelvis. 4. Aortic Atherosclerosis (ICD10-I70.0). Foci of coronary artery calcification and iliac artery calcification also noted. 5.  Uterus absent. Electronically Signed   By: Lowella Grip III M.D.   On: 02/27/2020 08:54   MR 3D Recon At Scanner  Result Date: 02/28/2020 CLINICAL DATA:  64 year old female with history of pancreatic lesion noted on  prior CT examination. EXAM: MRI ABDOMEN WITHOUT AND WITH CONTRAST (INCLUDING MRCP) TECHNIQUE: Multiplanar multisequence MR imaging of the abdomen was performed both before and after the administration of intravenous contrast. Heavily T2-weighted images of the biliary and pancreatic ducts were obtained, and three-dimensional MRCP images were rendered by post processing. CONTRAST:  64mL GADAVIST GADOBUTROL 1 MMOL/ML IV SOLN COMPARISON:  No prior abdominal MRI. CT the abdomen and pelvis 02/27/2020. FINDINGS: Comment: Portions of today's examination is considerably limited by extensive patient respiratory motion. Lower chest: Trace bilateral pleural effusions lying dependently. Hepatobiliary: Diffuse loss of signal intensity throughout the hepatic parenchyma on out of phase dual echo images indicative of hepatic steatosis. In the inferior aspect of segment 6 of the liver (axial image 29 of series 21 and coronal image 13 of series 4) there is a well-defined 3.4 x 3.2 x 4.2 cm T1 hypointense, T2 hyperintense, nonenhancing lesion, compatible with a simple cyst. No other suspicious hepatic lesions. No intra or extrahepatic biliary ductal dilatation. Gallbladder is normal in appearance. Pancreas: In the head and proximal body of the pancreas (axial image 36 of series 18 and coronal image 55 of series 20) there is a large lesion that is heterogeneous in T1 and T2 signal intensity, with predominantly T1 hypointensity the non dependently and T1 isointensity dependently with T2 hyperintensity non  dependently and T2 hypointensity dependently, but no evidence of internal enhancement, favored to represent a mildly complex pancreatic cyst. Spleen:  Unremarkable. Adrenals/Urinary Tract: Bilateral kidneys and bilateral adrenal glands are normal in appearance. No hydroureteronephrosis in the visualized portions of the abdomen. Stomach/Bowel: Visualized portions are unremarkable. Vascular/Lymphatic: No aneurysm identified in the  visualized abdominal vasculature. No lymphadenopathy noted in the abdomen. Other: Small amount of retroperitoneal fluid most evident adjacent to the pancreas. No significant volume of ascites noted in the visualized portions of the peritoneal cavity. Musculoskeletal: No aggressive appearing osseous lesions are noted in the visualized portions of the skeleton. IMPRESSION: 1. 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 This is exerting mass effect locally causing pancreatic ductal dilatation. There are surrounding inflammatory changes adjacent to the pancreas indicative of residual acute pancreatitis. 2. Hepatic steatosis. 3. Trace bilateral pleural effusions lying dependently. Electronically Signed   By: Vinnie Langton M.D.   On: 02/28/2020 14:20   Portable chest 1 View  Result Date: 02/28/2020 CLINICAL DATA:  Severe sepsis EXAM: PORTABLE CHEST 1 VIEW COMPARISON:  Yesterday FINDINGS: Normal heart size and mediastinal contours. Mild scarring or atelectasis. No acute infiltrate or edema. No effusion or pneumothorax. No acute osseous findings. IMPRESSION: No active disease. Electronically Signed   By: Monte Fantasia M.D.   On: 02/28/2020 06:33   DG Chest Port 1 View  Result Date: 02/27/2020 CLINICAL DATA:  Chest and abdominal pain EXAM: PORTABLE CHEST 1 VIEW COMPARISON:  Chest radiograph January 10, 2012 FINDINGS: There is slight atelectatic change in the bases. Lungs otherwise are clear. Heart size and pulmonary vascularity are normal. No adenopathy. There is aortic atherosclerosis. No bone lesions. IMPRESSION: Mild bibasilar atelectasis. Lungs otherwise clear. Cardiac silhouette normal. Aortic Atherosclerosis (ICD10-I70.0). Electronically Signed   By: Lowella Grip III M.D.   On: 02/27/2020 08:29   MR ABDOMEN MRCP W WO CONTAST  Result Date: 02/28/2020 CLINICAL DATA:  64 year old female with history  of pancreatic lesion noted on prior CT examination. EXAM: MRI ABDOMEN WITHOUT AND WITH CONTRAST (INCLUDING MRCP) TECHNIQUE: Multiplanar multisequence MR imaging of the abdomen was performed both before and after the administration of intravenous contrast. Heavily T2-weighted images of the biliary and pancreatic ducts were obtained, and three-dimensional MRCP images were rendered by post processing. CONTRAST:  68mL GADAVIST GADOBUTROL 1 MMOL/ML IV SOLN COMPARISON:  No prior abdominal MRI. CT the abdomen and pelvis 02/27/2020. FINDINGS: Comment: Portions of today's examination is considerably limited by extensive patient respiratory motion. Lower chest: Trace bilateral pleural effusions lying dependently. Hepatobiliary: Diffuse loss of signal intensity throughout the hepatic parenchyma on out of phase dual echo images indicative of hepatic steatosis. In the inferior aspect of segment 6 of the liver (axial image 29 of series 21 and coronal image 13 of series 4) there is a well-defined 3.4 x 3.2 x 4.2 cm T1 hypointense, T2 hyperintense, nonenhancing lesion, compatible with a simple cyst. No other suspicious hepatic lesions. No intra or extrahepatic biliary ductal dilatation. Gallbladder is normal in appearance. Pancreas: In the head and proximal body of the pancreas (axial image 36 of series 18 and coronal image 55 of series 20) there is a large lesion that is heterogeneous in T1 and T2 signal intensity, with predominantly T1 hypointensity the non dependently and T1 isointensity dependently with T2 hyperintensity non dependently and T2 hypointensity dependently, but no evidence of internal enhancement, favored to represent a mildly complex  pancreatic cyst. Spleen:  Unremarkable. Adrenals/Urinary Tract: Bilateral kidneys and bilateral adrenal glands are normal in appearance. No hydroureteronephrosis in the visualized portions of the abdomen. Stomach/Bowel: Visualized portions are unremarkable. Vascular/Lymphatic: No  aneurysm identified in the visualized abdominal vasculature. No lymphadenopathy noted in the abdomen. Other: Small amount of retroperitoneal fluid most evident adjacent to the pancreas. No significant volume of ascites noted in the visualized portions of the peritoneal cavity. Musculoskeletal: No aggressive appearing osseous lesions are noted in the visualized portions of the skeleton. IMPRESSION: 1. 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 This is exerting mass effect locally causing pancreatic ductal dilatation. There are surrounding inflammatory changes adjacent to the pancreas indicative of residual acute pancreatitis. 2. Hepatic steatosis. 3. Trace bilateral pleural effusions lying dependently. Electronically Signed   By: Vinnie Langton M.D.   On: 02/28/2020 14:20  [2 weeks]   Assessment: 64 year old female with history of hypertension and alcohol abuse presents to emergency department via EMS on June 14 for poor oral intake, postprandial abdominal pain. She was found to have hyponatremia with sodium of 122, worsening kidney function with creatinine of 1.47. Lactic acid 5.5, lipase 122. CT abdomen pelvis with acute on chronic pancreatitis with mildly complex pseudocyst. She was admitted with sepsis thought to be secondary to UTI (E. coli) as well as acute on chronic pancreatitis. Currently on Rocephin and Flagyl with positive blood culture growing Streptococcus species.  Acute on chronic pancreatitis: ?etoh related. Patient reports cutting back on etoh use in that past six months. Drinking less since she is feeling poorly. On CT there is an ill-defined mass in the proximal body of the pancreas measuring 3.2 x 3.2 cm which has attenuation values marginally greater than expected with a simple cyst is suspected to be a mildly complicated pseudocyst with MRI recommended for further evaluation. No  pancreatic or CBD ductal dilation. 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 This is exerting mass-effect locally causing pancreatic ductal dilatation. There are surrounding inflammatory changes adjacent to the pancreas indicative of residual acute pancreatitis. She had hepatic steatosis. Also noted to have a simple hepatic cyst.  Anemia: Hemoglobin 10.2 on admission, down to 8.7. Suspect change in hemoglobin likely dilutional. No overt GI bleeding noted. She has had mild normocytic anemia dating back to at least January 2018. No prior colonoscopy. EGD in November 2020 with LA grade C esophagitis, gastritis, no H. Pylori. Check anemia panel.   Plan: 1. Continue clear liquids, advance diet when patient's pain is lessened.  2. Continue pantoprazole.  3. Anemia panel.  4. etoh cessation.   Laureen Ochs. Bernarda Caffey [facility] [phone] 6/16/20212:10 PM     LOS: 2 days

## 2020-02-29 NOTE — Progress Notes (Signed)
PROGRESS NOTE  Judy Davis KXF:[id number] DOB: August 06, 1956 DOA: 02/27/2020 PCP: [facility]  Brief History:  64 y.o. female with history of hypertension and chronic alcoholism was sent to the ED by family members when her brother noted that the past several days she has had poor oral intake and has been complaining of abdominal pain.  She is not eating or drinking well.  She has been weak and frail and not ambulating.  Lying in the bed most of the time.  Her abdominal pain seems to be worsened with eating and that is why she has not been eating that much.  She describes her pain as sharp radiating into the back with burning and constantly aching.  She had an EGD by Dr. Oneida Davis in November 2020 with findings of grade C esophagitis and gastritis.  H. pylori was negative.  The patient was found to have sepsis secondary to bacteremia and UTI.  The patient also was noted to have an acute on chronic pancreatitis exacerbation.  GI was consulted to assist with management.   Assessment/Plan: Sepsis -Present on admission -Secondary to bacteremia and UTI -Continue ceftriaxone -Lactic acid peaked at 5.5 -Continue IV fluids -UA 21-50 WBC -Personally reviewed chest x-ray--no consolidations  Streptococcal bacteremia -Source unclear -Repeat blood cultures -Echocardiogram -Continue ceftriaxone 2 g daily  UTI -Urine culture growing E. 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 with mass-effect causing dilated pancreatic duct; surrounding inflammation adjacent to the pancreas consistent with residual acute pancreatitis -Tolerating clear liquids  Acute metabolic encephalopathy -Secondary to sepsis/infectious process -Overall improved  Hypokalemia -Replete -Magnesium  1.7  Hypertension -Patient initially had soft blood pressures  Vitamin D deficiency -Started on Drisdol      Status is: Inpatient  Remains inpatient appropriate because:IV treatments appropriate due to intensity of illness or inability to take PO   Dispo: The patient is from: Home              Anticipated d/c is to: Home              Anticipated d/c date is: 2 days              Patient currently is not medically stable to d/c.        Family Communication:  no Family at bedside  Consultants:  GI  Code Status:  FULL   DVT Prophylaxis:   [place] Lovenox   Procedures: As Listed in Progress Note Above  Antibiotics: Ceftriaxone 6/14>>> Metronidazole 6/14>>6/16      Subjective: Patient denies fevers, chills, headache, chest pain, dyspnea, nausea, vomiting, diarrhea, abdominal pain, dysuria, hematuria, hematochezia, and melena.   Objective: Vitals:   02/29/20 0900 02/29/20 1000 02/29/20 1100 02/29/20 1214  BP: (!) 144/95 (!) 155/84 (!) 161/84   Pulse: 77 72 72   Resp: 18 14 16    Temp:    98 F (36.7 C)  TempSrc:    Oral  SpO2: 100% 100% 100%   Weight:      Height:        Intake/Output Summary (Last 24 hours) at 02/29/2020 1341 Last data filed at 02/29/2020 0937 Gross per 24 hour  Intake 2632 ml  Output 751 ml  Net 1881 ml   Weight change:  Exam:   General:  Pt is alert,  follows commands appropriately, not in acute distress  HEENT: No icterus, No thrush, No neck mass, Levan/AT  Cardiovascular: RRR, S1/S2, no rubs, no gallops  Respiratory:bibasilar crackles.  No wheeze  Abdomen: Soft/+BS, non tender, non distended, no guarding  Extremities: No edema, No lymphangitis, No petechiae, No rashes, no synovitis   Data Reviewed: I have personally reviewed following labs and imaging studies Basic Metabolic Panel: Recent Labs  Lab 02/27/20 0447 02/27/20 0608 02/27/20 1515 02/28/20 0406 02/29/20 0438  NA 122*  --   --  129* 127*  K 4.0  --   --   3.1* 2.7*  CL 87*  --   --  100 96*  CO2 18*  --   --  23 25  GLUCOSE 133*  --   --  80 97  BUN 13  --   --  8 <5*  CREATININE 1.47*  --   --  0.60 0.52  CALCIUM 8.8*  --   --  8.1* 8.0*  MG  --  1.8 1.7 1.8 1.7  PHOS  --   --  3.1  --   --    Liver Function Tests: Recent Labs  Lab 02/27/20 0447 02/28/20 0406 02/29/20 0438  AST 30 32 19  ALT 20 16 13   ALKPHOS 113 69 61  BILITOT 1.1 0.6 0.2*  PROT 7.4 6.0* 5.6*  ALBUMIN 2.8* 2.3* 2.2*   Recent Labs  Lab 02/27/20 0832 02/28/20 0406 02/29/20 0438  LIPASE 112* 63* 102*   No results for input(s): AMMONIA in the last 168 hours. Coagulation Profile: Recent Labs  Lab 02/28/20 0406  INR 1.8*   CBC: Recent Labs  Lab 02/27/20 0447 02/28/20 0406 02/29/20 0438  WBC 8.4 19.6* 12.5*  NEUTROABS 7.9* 17.2* 10.3*  HGB 10.2* 8.7* 8.8*  HCT 31.6* 27.4* 26.6*  MCV 100.0 101.5* 98.2  PLT 308 298 312   Cardiac Enzymes: Recent Labs  Lab 02/27/20 0608  CKTOTAL 26*   BNP: Invalid input(s): POCBNP CBG: No results for input(s): GLUCAP in the last 168 hours. HbA1C: Recent Labs    02/27/20 0447  HGBA1C 5.4   Urine analysis:    Component Value Date/Time   COLORURINE AMBER (A) 02/27/2020 0502   APPEARANCEUR CLOUDY (A) 02/27/2020 0502   LABSPEC 1.012 02/27/2020 0502   PHURINE 7.0 02/27/2020 0502   GLUCOSEU NEGATIVE 02/27/2020 0502   HGBUR NEGATIVE 02/27/2020 0502   BILIRUBINUR NEGATIVE 02/27/2020 0502   KETONESUR NEGATIVE 02/27/2020 0502   PROTEINUR 30 (A) 02/27/2020 0502   UROBILINOGEN 0.2 05/10/2015 1000   NITRITE NEGATIVE 02/27/2020 0502   LEUKOCYTESUR TRACE (A) 02/27/2020 0502   Sepsis Labs: @LABRCNTIP (procalcitonin:4,lacticidven:4) ) Recent Results (from the past 240 hour(s))  Urine culture     Status: Abnormal   Collection Time: 02/27/20  7:27 AM   Specimen: Urine, Clean Catch  Result Value Ref Range Status   Specimen Description   Final    URINE, CLEAN CATCH Performed at [facility], [address], [place], [place] [address]    Special Requests   Final    NONE Performed at [facility], [address], [place], [place] [address]    Culture >=100,000 COLONIES/mL ESCHERICHIA COLI (A)  Final   Report Status 02/29/2020 FINAL  Final   Organism ID, Bacteria ESCHERICHIA COLI (A)  Final      Susceptibility   Escherichia coli - MIC*    AMPICILLIN 4 SENSITIVE Sensitive     CEFAZOLIN <=4 SENSITIVE Sensitive  CEFTRIAXONE <=0.25 SENSITIVE Sensitive     CIPROFLOXACIN <=0.25 SENSITIVE Sensitive     GENTAMICIN <=1 SENSITIVE Sensitive     IMIPENEM <=0.25 SENSITIVE Sensitive     NITROFURANTOIN <=16 SENSITIVE Sensitive     TRIMETH/SULFA <=20 SENSITIVE Sensitive     AMPICILLIN/SULBACTAM <=2 SENSITIVE Sensitive     PIP/TAZO <=4 SENSITIVE Sensitive     * >=100,000 COLONIES/mL ESCHERICHIA COLI  Culture, blood (Routine X 2) w Reflex to ID Panel     Status: None (Preliminary result)   Collection Time: 02/27/20  8:07 AM   Specimen: BLOOD LEFT HAND  Result Value Ref Range Status   Specimen Description   Final    BLOOD LEFT HAND Performed at [facility], [address], [place], [place] [address]    Special Requests   Final    Blood Culture results may not be optimal due to an inadequate volume of blood received in culture bottles BOTTLES DRAWN AEROBIC AND ANAEROBIC Performed at [facility], [address], [place], [place] [address]    Culture  Setup Time   Final    GRAM POSITIVE COCCI IN CHAINS IN BOTH AEROBIC AND ANAEROBIC BOTTLES Gram Stain Report Called to,Read Back By and Verified With: MURPHY,E 02/28/2020 @1210  BY JONES, T Organism ID to follow CRITICAL RESULT CALLED TO, READ BACK BY AND VERIFIED WITH: E MURPHY RN 02/28/20 1900 JDW Performed at [facility], [place] [address] [place], [place] [address]    Culture GRAM POSITIVE COCCI  Final   Report Status PENDING  Incomplete  Blood Culture ID Panel (Reflexed)     Status: Abnormal   Collection Time: 02/27/20  8:07 AM  Result Value  Ref Range Status   Enterococcus species NOT DETECTED NOT DETECTED Final   Listeria monocytogenes NOT DETECTED NOT DETECTED Final   Staphylococcus species NOT DETECTED NOT DETECTED Final   Staphylococcus aureus (BCID) NOT DETECTED NOT DETECTED Final   Streptococcus species DETECTED (A) NOT DETECTED Final    Comment: Not Enterococcus species, Streptococcus agalactiae, Streptococcus pyogenes, or Streptococcus pneumoniae. CRITICAL RESULT CALLED TO, READ BACK BY AND VERIFIED WITH: E MURPHY RN 02/28/20 1900 JDW    Streptococcus agalactiae NOT DETECTED NOT DETECTED Final   Streptococcus pneumoniae NOT DETECTED NOT DETECTED Final   Streptococcus pyogenes NOT DETECTED NOT DETECTED Final   Acinetobacter baumannii NOT DETECTED NOT DETECTED Final   Enterobacteriaceae species NOT DETECTED NOT DETECTED Final   Enterobacter cloacae complex NOT DETECTED NOT DETECTED Final   Escherichia coli NOT DETECTED NOT DETECTED Final   Klebsiella oxytoca NOT DETECTED NOT DETECTED Final   Klebsiella pneumoniae NOT DETECTED NOT DETECTED Final   Proteus species NOT DETECTED NOT DETECTED Final   Serratia marcescens NOT DETECTED NOT DETECTED Final   Haemophilus influenzae NOT DETECTED NOT DETECTED Final   Neisseria meningitidis NOT DETECTED NOT DETECTED Final   Pseudomonas aeruginosa NOT DETECTED NOT DETECTED Final   Candida albicans NOT DETECTED NOT DETECTED Final   Candida glabrata NOT DETECTED NOT DETECTED Final   Candida krusei NOT DETECTED NOT DETECTED Final   Candida parapsilosis NOT DETECTED NOT DETECTED Final   Candida tropicalis NOT DETECTED NOT DETECTED Final    Comment: Performed at [facility], [place] [address] [place], [place] [address]  Culture, blood (Routine X 2) w Reflex to ID Panel     Status: None (Preliminary result)   Collection Time: 02/27/20  8:18 AM   Specimen: BLOOD RIGHT HAND  Result Value Ref Range Status  Specimen Description BLOOD RIGHT HAND  Final   Special Requests   Final     Blood Culture results may not be optimal due to an inadequate volume of blood received in culture bottles BOTTLES DRAWN AEROBIC ONLY   Culture  Setup Time   Final    AEROBIC BOTTLE ONLY GRAM POSITIVE COCCI IN CHAINS Gram Stain Report Called to,Read Back By and Verified With: H TETREAULT,RN @0517  02/29/20 MKELLY    Culture   Final    NO GROWTH 2 DAYS Performed at [facility], [address], [place], [place] [address]    Report Status PENDING  Incomplete  SARS Coronavirus 2 by RT PCR (hospital order, performed in [place] hospital lab) Nasopharyngeal     Status: None   Collection Time: 02/27/20  8:24 AM   Specimen: Nasopharyngeal  Result Value Ref Range Status   SARS Coronavirus 2 NEGATIVE NEGATIVE Final    Comment: (NOTE) SARS-CoV-2 target nucleic acids are NOT DETECTED.  The SARS-CoV-2 RNA is generally detectable in upper and lower respiratory specimens during the acute phase of infection. The lowest concentration of SARS-CoV-2 viral copies this assay can detect is 250 copies / mL. A negative result does not preclude SARS-CoV-2 infection and should not be used as the sole basis for treatment or other patient management decisions.  A negative result may occur with improper specimen collection / handling, submission of specimen other than nasopharyngeal swab, presence of viral mutation(s) within the areas targeted by this assay, and inadequate number of viral copies (<250 copies / mL). A negative result must be combined with clinical observations, patient history, and epidemiological information.  Fact Sheet for Patients:   StrictlyIdeas.no  Fact Sheet for Healthcare Providers: BankingDealers.co.za  This test is not yet approved or  cleared by the [place] FDA and has been authorized for detection and/or diagnosis of SARS-CoV-2 by FDA under an Emergency Use Authorization (EUA).  This EUA will remain in effect (meaning this  test can be used) for the duration of the COVID-19 declaration under Section 564(b)(1) of the Act, 21 U.S.C. section 360bbb-3(b)(1), unless the authorization is terminated or revoked sooner.  Performed at [facility], [address] [place], [place] [address]   MRSA PCR Screening     Status: None   Collection Time: 02/27/20  8:24 AM   Specimen: Nasopharyngeal  Result Value Ref Range Status   MRSA by PCR NEGATIVE NEGATIVE Final    Comment:        The GeneXpert MRSA Assay (FDA approved for NASAL specimens only), is one component of a comprehensive MRSA colonization surveillance program. It is not intended to diagnose MRSA infection nor to guide or monitor treatment for MRSA infections. Performed at [facility], [address], [place], [place] [address]   C Difficile Quick Screen w PCR reflex     Status: None   Collection Time: 02/27/20  3:28 PM   Specimen: STOOL  Result Value Ref Range Status   C Diff antigen NEGATIVE NEGATIVE Final   C Diff toxin NEGATIVE NEGATIVE Final   C Diff interpretation No C. difficile detected.  Final    Comment: Performed at [facility], [address] [place], [place] [address]     Scheduled Meds: . Chlorhexidine Gluconate Cloth  6 each Topical Daily  . enoxaparin (LOVENOX) injection  40 mg Subcutaneous Q24H  . feeding supplement  1 Container Oral TID BM  . folic acid  1 mg Oral Daily  . LORazepam  0-4  mg Intravenous Q6H   Followed by  . LORazepam  0-4 mg Intravenous Q12H  . multivitamin with minerals  1 tablet Oral Daily  . pantoprazole (PROTONIX) IV  40 mg Intravenous Q24H  . potassium chloride  40 mEq Oral Once  . thiamine  100 mg Oral Daily   Or  . thiamine  100 mg Intravenous Daily  . [START ON 03/01/2020] Vitamin D (Ergocalciferol)  50,000 Units Oral Q7 days   Continuous Infusions: . 0.9 % NaCl with KCl 20 mEq / L 100 mL/hr at 02/29/20 0937  . cefTRIAXone (ROCEPHIN)  IV Stopped (02/29/20 0932)  . metronidazole Stopped (02/29/20  6759)    Procedures/Studies: CT Abdomen Pelvis Wo Contrast  Result Date: 02/27/2020 CLINICAL DATA:  Pain with nausea and vomiting EXAM: CT ABDOMEN AND PELVIS WITHOUT CONTRAST TECHNIQUE: Multidetector CT imaging of the abdomen and pelvis was performed following the standard protocol without IV contrast. Oral contrast was administered. COMPARISON:  August 07, 2019 FINDINGS: Lower chest: There is atelectatic change in the lung bases. There is no lung base edema or airspace opacity. There is coronary artery calcification. Hepatobiliary: There is a cyst in the posterior segment of the right lobe of the liver measuring 3.5 x 3.3 cm, stable. There is mild fatty infiltration near the fissure for the ligamentum teres. No other liver lesions are evident on this noncontrast enhanced study. The gallbladder wall is not appreciably thickened. There is no biliary duct dilatation. Pancreas: The pancreas appears enlarged in the head and body regions with the suggestion of pancreatic edema. There is a somewhat ill-defined mass in the proximal body of the pancreas measuring 3.2 x 3.2 cm which has attenuation values marginally greater than is expected with a simple cyst. There are foci of pancreatic calcification which is stable consistent with chronic pancreatitis. There is mild peripancreatic fluid which extends to the level of the second portion of the duodenum. Spleen: No splenic lesions are evident. Adrenals/Urinary Tract: There is adrenal hypertrophy bilaterally. Adrenals appear stable compared to prior study. There is a 7 x 7 mm focus of increased attenuation in the lower pole of the left kidney, unchanged from previous study and likely representing a small hyperdense cyst, stable. 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 There is no hydronephrosis on either side. No appreciable renal or ureteral calculus evident on either side. Urinary bladder is midline with wall thickness within normal limits. Stomach/Bowel: There is no appreciable  bowel wall or mesenteric thickening. The terminal ileum appears normal. There is mild fatty infiltration in the ileocecal valve. There is no evident bowel obstruction. There is no free air or portal venous air. Vascular/Lymphatic: There is no abdominal aortic aneurysm. There is aortic atherosclerosis. There is also scattered calcification in each common iliac artery. There is no evident adenopathy in the abdomen or pelvis. Reproductive: Uterus is absent.  No pelvic mass evident. Other: No periappendiceal region inflammation. No abscess or ascites is evident in the abdomen or pelvis. There is fat in the umbilicus. Musculoskeletal: There is degenerative change in the lumbar spine. There is degenerative change in each hip joint. No blastic or lytic bone lesions. No intramuscular lesions are evident. IMPRESSION: 1. Edema in the midportion of the pancreas with a mass in the proximal body of the pancreas measuring 3.2 x 3.2 cm, a likely mildly complicated pseudocyst. There is slight peripancreatic fluid. Suspect acute pancreatitis. Calcification noted in the pancreas consistent with underlying chronic pancreatitis as well. With respect to mass in the body of the pancreas, further  evaluation with dedicated MR or CT pre and post-contrast to further evaluate the pancreas is felt to be warranted. The pancreatic duct is not appreciably dilated. 2. Probable hyperdense cyst in the left kidney, stable, measuring 7 mm. Left kidney malrotated. No hydronephrosis. No renal or ureteral calculus on either side. Urinary bladder wall thickness normal. 3.  No bowel obstruction.  No abscess in the abdomen or pelvis. 4. Aortic Atherosclerosis (ICD10-I70.0). Foci of coronary artery calcification and iliac artery calcification also noted. 5.  Uterus absent. Electronically Signed   By: Lowella Grip III M.D.   On: 02/27/2020 08:54   MR 3D Recon At Scanner  Result Date: 02/28/2020 CLINICAL DATA:  64 year old female with history of  pancreatic lesion noted on prior CT examination. EXAM: MRI ABDOMEN WITHOUT AND WITH CONTRAST (INCLUDING MRCP) TECHNIQUE: Multiplanar multisequence MR imaging of the abdomen was performed both before and after the administration of intravenous contrast. Heavily T2-weighted images of the biliary and pancreatic ducts were obtained, and three-dimensional MRCP images were rendered by post processing. CONTRAST:  87mL GADAVIST GADOBUTROL 1 MMOL/ML IV SOLN COMPARISON:  No prior abdominal MRI. CT the abdomen and pelvis 02/27/2020. FINDINGS: Comment: Portions of today's examination is considerably limited by extensive patient respiratory motion. Lower chest: Trace bilateral pleural effusions lying dependently. Hepatobiliary: Diffuse loss of signal intensity throughout the hepatic parenchyma on out of phase dual echo images indicative of hepatic steatosis. In the inferior aspect of segment 6 of the liver (axial image 29 of series 21 and coronal image 13 of series 4) there is a well-defined 3.4 x 3.2 x 4.2 cm T1 hypointense, T2 hyperintense, nonenhancing lesion, compatible with a simple cyst. No other suspicious hepatic lesions. No intra or extrahepatic biliary ductal dilatation. Gallbladder is normal in appearance. Pancreas: In the head and proximal body of the pancreas (axial image 36 of series 18 and coronal image 55 of series 20) there is a large lesion that is heterogeneous in T1 and T2 signal intensity, with predominantly T1 hypointensity the non dependently and T1 isointensity dependently with T2 hyperintensity non dependently and T2 hypointensity dependently, but no evidence of internal enhancement, favored to represent a mildly complex pancreatic cyst. Spleen:  Unremarkable. Adrenals/Urinary Tract: Bilateral kidneys and bilateral adrenal glands are normal in appearance. No hydroureteronephrosis in the visualized portions of the abdomen. Stomach/Bowel: Visualized portions are unremarkable. Vascular/Lymphatic: No aneurysm  identified in the visualized abdominal vasculature. No lymphadenopathy noted in the abdomen. Other: Small amount of retroperitoneal fluid most evident adjacent to the pancreas. No significant volume of ascites noted in the visualized portions of the peritoneal cavity. Musculoskeletal: No aggressive appearing osseous lesions are noted in the visualized portions of the skeleton. IMPRESSION: 1. 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 This is exerting mass effect locally causing pancreatic ductal dilatation. There are surrounding inflammatory changes adjacent to the pancreas indicative of residual acute pancreatitis. 2. Hepatic steatosis. 3. Trace bilateral pleural effusions lying dependently. Electronically Signed   By: Vinnie Langton M.D.   On: 02/28/2020 14:20   Portable chest 1 View  Result Date: 02/28/2020 CLINICAL DATA:  Severe sepsis EXAM: PORTABLE CHEST 1 VIEW COMPARISON:  Yesterday FINDINGS: Normal heart size and mediastinal contours. Mild scarring or atelectasis. No acute infiltrate or edema. No effusion or pneumothorax. No acute osseous findings. IMPRESSION: No active disease. Electronically Signed   By: Monte Fantasia M.D.   On: 02/28/2020 06:33  DG Chest Port 1 View  Result Date: 02/27/2020 CLINICAL DATA:  Chest and abdominal pain EXAM: PORTABLE CHEST 1 VIEW COMPARISON:  Chest radiograph January 10, 2012 FINDINGS: There is slight atelectatic change in the bases. Lungs otherwise are clear. Heart size and pulmonary vascularity are normal. No adenopathy. There is aortic atherosclerosis. No bone lesions. IMPRESSION: Mild bibasilar atelectasis. Lungs otherwise clear. Cardiac silhouette normal. Aortic Atherosclerosis (ICD10-I70.0). Electronically Signed   By: Lowella Grip III M.D.   On: 02/27/2020 08:29   MR ABDOMEN MRCP W WO CONTAST  Result Date: 02/28/2020 CLINICAL DATA:  64 year old  female with history of pancreatic lesion noted on prior CT examination. EXAM: MRI ABDOMEN WITHOUT AND WITH CONTRAST (INCLUDING MRCP) TECHNIQUE: Multiplanar multisequence MR imaging of the abdomen was performed both before and after the administration of intravenous contrast. Heavily T2-weighted images of the biliary and pancreatic ducts were obtained, and three-dimensional MRCP images were rendered by post processing. CONTRAST:  24mL GADAVIST GADOBUTROL 1 MMOL/ML IV SOLN COMPARISON:  No prior abdominal MRI. CT the abdomen and pelvis 02/27/2020. FINDINGS: Comment: Portions of today's examination is considerably limited by extensive patient respiratory motion. Lower chest: Trace bilateral pleural effusions lying dependently. Hepatobiliary: Diffuse loss of signal intensity throughout the hepatic parenchyma on out of phase dual echo images indicative of hepatic steatosis. In the inferior aspect of segment 6 of the liver (axial image 29 of series 21 and coronal image 13 of series 4) there is a well-defined 3.4 x 3.2 x 4.2 cm T1 hypointense, T2 hyperintense, nonenhancing lesion, compatible with a simple cyst. No other suspicious hepatic lesions. No intra or extrahepatic biliary ductal dilatation. Gallbladder is normal in appearance. Pancreas: In the head and proximal body of the pancreas (axial image 36 of series 18 and coronal image 55 of series 20) there is a large lesion that is heterogeneous in T1 and T2 signal intensity, with predominantly T1 hypointensity the non dependently and T1 isointensity dependently with T2 hyperintensity non dependently and T2 hypointensity dependently, but no evidence of internal enhancement, favored to represent a mildly complex pancreatic cyst. Spleen:  Unremarkable. Adrenals/Urinary Tract: Bilateral kidneys and bilateral adrenal glands are normal in appearance. No hydroureteronephrosis in the visualized portions of the abdomen. Stomach/Bowel: Visualized portions are unremarkable.  Vascular/Lymphatic: No aneurysm identified in the visualized abdominal vasculature. No lymphadenopathy noted in the abdomen. Other: Small amount of retroperitoneal fluid most evident adjacent to the pancreas. No significant volume of ascites noted in the visualized portions of the peritoneal cavity. Musculoskeletal: No aggressive appearing osseous lesions are noted in the visualized portions of the skeleton. IMPRESSION: 1. 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 This is exerting mass effect locally causing pancreatic ductal dilatation. There are surrounding inflammatory changes adjacent to the pancreas indicative of residual acute pancreatitis. 2. Hepatic steatosis. 3. Trace bilateral pleural effusions lying dependently. Electronically Signed   By: Vinnie [place] M.D.   On: 02/28/2020 14:20    Orson Eva, DO  [facility]  If 7PM-7AM, please contact night-coverage [email or web address] Password [email or web address] 02/29/2020, 1:41 PM   LOS: 2 days

## 2020-03-01 ENCOUNTER — Telehealth: Payer: Self-pay | Admitting: Gastroenterology

## 2020-03-01 ENCOUNTER — Inpatient Hospital Stay (HOSPITAL_COMMUNITY): Payer: Medicare Other

## 2020-03-01 DIAGNOSIS — R7881 Bacteremia: Secondary | ICD-10-CM

## 2020-03-01 DIAGNOSIS — N179 Acute kidney failure, unspecified: Secondary | ICD-10-CM

## 2020-03-01 LAB — IRON AND TIBC
Iron: 33 ug/dL (ref 28–170)
Saturation Ratios: 21 % (ref 10.4–31.8)
TIBC: 160 ug/dL — ABNORMAL LOW (ref 250–450)
UIBC: 127 ug/dL

## 2020-03-01 LAB — CBC WITH DIFFERENTIAL/PLATELET
Abs Immature Granulocytes: 0.08 10*3/uL — ABNORMAL HIGH (ref 0.00–0.07)
Basophils Absolute: 0.1 10*3/uL (ref 0.0–0.1)
Basophils Relative: 1 %
Eosinophils Absolute: 0 10*3/uL (ref 0.0–0.5)
Eosinophils Relative: 1 %
HCT: 26.9 % — ABNORMAL LOW (ref 36.0–46.0)
Hemoglobin: 8.9 g/dL — ABNORMAL LOW (ref 12.0–15.0)
Immature Granulocytes: 1 %
Lymphocytes Relative: 22 %
Lymphs Abs: 1.8 10*3/uL (ref 0.7–4.0)
MCH: 32.4 pg (ref 26.0–34.0)
MCHC: 33.1 g/dL (ref 30.0–36.0)
MCV: 97.8 fL (ref 80.0–100.0)
Monocytes Absolute: 0.7 10*3/uL (ref 0.1–1.0)
Monocytes Relative: 8 %
Neutro Abs: 5.5 10*3/uL (ref 1.7–7.7)
Neutrophils Relative %: 67 %
Platelets: 330 10*3/uL (ref 150–400)
RBC: 2.75 MIL/uL — ABNORMAL LOW (ref 3.87–5.11)
RDW: 14.2 % (ref 11.5–15.5)
WBC: 8.1 10*3/uL (ref 4.0–10.5)
nRBC: 0 % (ref 0.0–0.2)

## 2020-03-01 LAB — FERRITIN: Ferritin: 435 ng/mL — ABNORMAL HIGH (ref 11–307)

## 2020-03-01 LAB — LIPID PANEL
Cholesterol: 84 mg/dL (ref 0–200)
HDL: 28 mg/dL — ABNORMAL LOW (ref >40)
LDL Cholesterol: 49 mg/dL (ref 0–99)
Total CHOL/HDL Ratio: 3 RATIO
Triglycerides: 37 mg/dL (ref <150)
VLDL: 7 mg/dL (ref 0–40)

## 2020-03-01 LAB — COMPREHENSIVE METABOLIC PANEL
ALT: 11 U/L (ref 0–44)
AST: 18 U/L (ref 15–41)
Albumin: 2.4 g/dL — ABNORMAL LOW (ref 3.5–5.0)
Alkaline Phosphatase: 74 U/L (ref 38–126)
Anion gap: 7 (ref 5–15)
BUN: <5 mg/dL — ABNORMAL LOW (ref 8–23)
CO2: 23 mmol/L (ref 22–32)
Calcium: 8.2 mg/dL — ABNORMAL LOW (ref 8.9–10.3)
Chloride: 96 mmol/L — ABNORMAL LOW (ref 98–111)
Creatinine, Ser: 0.48 mg/dL (ref 0.44–1.00)
GFR calc Af Amer: >60 mL/min (ref >60)
GFR calc non Af Amer: >60 mL/min (ref >60)
Glucose, Bld: 104 mg/dL — ABNORMAL HIGH (ref 70–99)
Potassium: 3.8 mmol/L (ref 3.5–5.1)
Sodium: 126 mmol/L — ABNORMAL LOW (ref 135–145)
Total Bilirubin: 0.2 mg/dL — ABNORMAL LOW (ref 0.3–1.2)
Total Protein: 5.9 g/dL — ABNORMAL LOW (ref 6.5–8.1)

## 2020-03-01 LAB — ECHOCARDIOGRAM COMPLETE
Height: 66 in
Weight: 1728.41 oz

## 2020-03-01 LAB — MAGNESIUM: Magnesium: 1.6 mg/dL — ABNORMAL LOW (ref 1.7–2.4)

## 2020-03-01 LAB — VITAMIN B12: Vitamin B-12: 315 pg/mL (ref 180–914)

## 2020-03-01 LAB — SODIUM, URINE, RANDOM: Sodium, Ur: 153 mmol/L

## 2020-03-01 LAB — FOLATE: Folate: 9.6 ng/mL (ref >5.9)

## 2020-03-01 LAB — OSMOLALITY: Osmolality: 266 mOsm/kg — ABNORMAL LOW (ref 275–295)

## 2020-03-01 LAB — CREATININE, URINE, RANDOM: Creatinine, Urine: 23.9 mg/dL

## 2020-03-01 MED ORDER — PANCRELIPASE (LIP-PROT-AMYL) 12000-38000 UNITS PO CPEP
36000.0000 [IU] | ORAL_CAPSULE | ORAL | Status: DC
  Administered 2020-03-01 – 2020-03-03 (×6): 36000 [IU] via ORAL
  Filled 2020-03-01 (×7): qty 3

## 2020-03-01 MED ORDER — MAGNESIUM SULFATE 2 GM/50ML IV SOLN
2.0000 g | Freq: Once | INTRAVENOUS | Status: AC
  Administered 2020-03-01: 2 g via INTRAVENOUS
  Filled 2020-03-01: qty 50

## 2020-03-01 MED ORDER — PANCRELIPASE (LIP-PROT-AMYL) 12000-38000 UNITS PO CPEP
72000.0000 [IU] | ORAL_CAPSULE | Freq: Three times a day (TID) | ORAL | Status: DC
  Administered 2020-03-01 – 2020-03-04 (×9): 72000 [IU] via ORAL
  Filled 2020-03-01 (×9): qty 6

## 2020-03-01 NOTE — Telephone Encounter (Signed)
Patient needs hospital follow up in 6 weeks for pancreatitis and pancreatic pseudocyst.

## 2020-03-01 NOTE — Progress Notes (Signed)
Subjective:  Feels better. Sitting up in chair. Tolerating clears. Does not feel hungry. Abdominal pain is less.  Objective: Vital signs in last 24 hours: Temp:  [97.5 F (36.4 C)-98.9 F (37.2 C)] 98.1 F (36.7 C) (06/17 0440) Pulse Rate:  [72-83] 77 (06/17 0440) Resp:  [14-20] 20 (06/16 2121) BP: (144-161)/(84-95) 145/90 (06/17 0440) SpO2:  [100 %] 100 % (06/17 0440) Last BM Date: 02/29/20 General:   Alert,  Well-developed, well-nourished, pleasant and cooperative in NAD Head:  Normocephalic and atraumatic. Eyes:  Sclera clear, no icterus.  Abdomen:  Soft, mild epig tenderness and nondistended. Normal bowel sounds, without guarding, and without rebound.   Extremities:  Without clubbing, deformity or edema. Neurologic:  Alert and  oriented x4;  grossly normal neurologically. Skin:  Intact without significant lesions or rashes. Psych:  Alert and cooperative. Normal mood and affect.  Intake/Output from previous day: 06/16 0701 - 06/17 0700 In: 1465.6 [I.V.:950.3; IV Piggyback:515.3] Out: 900 [Urine:900] Intake/Output this shift: No intake/output data recorded.  Lab Results: CBC Recent Labs    02/28/20 0406 02/29/20 0438 03/01/20 0457  WBC 19.6* 12.5* 8.1  HGB 8.7* 8.8* 8.9*  HCT 27.4* 26.6* 26.9*  MCV 101.5* 98.2 97.8  PLT 298 312 330   BMET Recent Labs    02/28/20 0406 02/29/20 0438 03/01/20 0457  NA 129* 127* 126*  K 3.1* 2.7* 3.8  CL 100 96* 96*  CO2 23 25 23   GLUCOSE 80 97 104*  BUN 8 <5* <5*  CREATININE 0.60 0.52 0.48  CALCIUM 8.1* 8.0* 8.2*   LFTs Recent Labs    02/28/20 0406 02/29/20 0438 03/01/20 0457  BILITOT 0.6 0.2* 0.2*  ALKPHOS 69 61 74  AST 32 19 18  ALT 16 13 11   PROT 6.0* 5.6* 5.9*  ALBUMIN 2.3* 2.2* 2.4*   Recent Labs    02/27/20 0832 02/28/20 0406 02/29/20 0438  LIPASE 112* 63* 102*   PT/INR Recent Labs    02/28/20 0406  LABPROT 20.2*  INR 1.8*      Imaging Studies: CT Abdomen Pelvis Wo Contrast  Result Date:  02/27/2020 CLINICAL DATA:  Pain with nausea and vomiting EXAM: CT ABDOMEN AND PELVIS WITHOUT CONTRAST TECHNIQUE: Multidetector CT imaging of the abdomen and pelvis was performed following the standard protocol without IV contrast. Oral contrast was administered. COMPARISON:  August 07, 2019 FINDINGS: Lower chest: There is atelectatic change in the lung bases. There is no lung base edema or airspace opacity. There is coronary artery calcification. Hepatobiliary: There is a cyst in the posterior segment of the right lobe of the liver measuring 3.5 x 3.3 cm, stable. There is mild fatty infiltration near the fissure for the ligamentum teres. No other liver lesions are evident on this noncontrast enhanced study. The gallbladder wall is not appreciably thickened. There is no biliary duct dilatation. Pancreas: The pancreas appears enlarged in the head and body regions with the suggestion of pancreatic edema. There is a somewhat ill-defined mass in the proximal body of the pancreas measuring 3.2 x 3.2 cm which has attenuation values marginally greater than is expected with a simple cyst. There are foci of pancreatic calcification which is stable consistent with chronic pancreatitis. There is mild peripancreatic fluid which extends to the level of the second portion of the duodenum. Spleen: No splenic lesions are evident. Adrenals/Urinary Tract: There is adrenal hypertrophy bilaterally. Adrenals appear stable compared to prior study. There is a 7 x 7 mm focus of increased attenuation in the lower pole  of the left kidney, unchanged from previous study and likely representing a small hyperdense cyst, stable. 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 There is no hydronephrosis on either side. No appreciable renal or ureteral calculus evident on either side. Urinary bladder is midline with wall thickness within normal limits. Stomach/Bowel: There is no appreciable bowel wall or mesenteric thickening. The terminal ileum appears normal. There  is mild fatty infiltration in the ileocecal valve. There is no evident bowel obstruction. There is no free air or portal venous air. Vascular/Lymphatic: There is no abdominal aortic aneurysm. There is aortic atherosclerosis. There is also scattered calcification in each common iliac artery. There is no evident adenopathy in the abdomen or pelvis. Reproductive: Uterus is absent.  No pelvic mass evident. Other: No periappendiceal region inflammation. No abscess or ascites is evident in the abdomen or pelvis. There is fat in the umbilicus. Musculoskeletal: There is degenerative change in the lumbar spine. There is degenerative change in each hip joint. No blastic or lytic bone lesions. No intramuscular lesions are evident. IMPRESSION: 1. Edema in the midportion of the pancreas with a mass in the proximal body of the pancreas measuring 3.2 x 3.2 cm, a likely mildly complicated pseudocyst. There is slight peripancreatic fluid. Suspect acute pancreatitis. Calcification noted in the pancreas consistent with underlying chronic pancreatitis as well. With respect to mass in the body of the pancreas, further evaluation with dedicated MR or CT pre and post-contrast to further evaluate the pancreas is felt to be warranted. The pancreatic duct is not appreciably dilated. 2. Probable hyperdense cyst in the left kidney, stable, measuring 7 mm. Left kidney malrotated. No hydronephrosis. No renal or ureteral calculus on either side. Urinary bladder wall thickness normal. 3.  No bowel obstruction.  No abscess in the abdomen or pelvis. 4. Aortic Atherosclerosis (ICD10-I70.0). Foci of coronary artery calcification and iliac artery calcification also noted. 5.  Uterus absent. Electronically Signed   By: Lowella Grip III M.D.   On: 02/27/2020 08:54   MR 3D Recon At Scanner  Result Date: 02/28/2020 CLINICAL DATA:  64 year old female with history of pancreatic lesion noted on prior CT examination. EXAM: MRI ABDOMEN WITHOUT AND WITH  CONTRAST (INCLUDING MRCP) TECHNIQUE: Multiplanar multisequence MR imaging of the abdomen was performed both before and after the administration of intravenous contrast. Heavily T2-weighted images of the biliary and pancreatic ducts were obtained, and three-dimensional MRCP images were rendered by post processing. CONTRAST:  74mL GADAVIST GADOBUTROL 1 MMOL/ML IV SOLN COMPARISON:  No prior abdominal MRI. CT the abdomen and pelvis 02/27/2020. FINDINGS: Comment: Portions of today's examination is considerably limited by extensive patient respiratory motion. Lower chest: Trace bilateral pleural effusions lying dependently. Hepatobiliary: Diffuse loss of signal intensity throughout the hepatic parenchyma on out of phase dual echo images indicative of hepatic steatosis. In the inferior aspect of segment 6 of the liver (axial image 29 of series 21 and coronal image 13 of series 4) there is a well-defined 3.4 x 3.2 x 4.2 cm T1 hypointense, T2 hyperintense, nonenhancing lesion, compatible with a simple cyst. No other suspicious hepatic lesions. No intra or extrahepatic biliary ductal dilatation. Gallbladder is normal in appearance. Pancreas: In the head and proximal body of the pancreas (axial image 36 of series 18 and coronal image 55 of series 20) there is a large lesion that is heterogeneous in T1 and T2 signal intensity, with predominantly T1 hypointensity the non dependently and T1 isointensity dependently with T2 hyperintensity non dependently and T2 hypointensity dependently, but no evidence  of internal enhancement, favored to represent a mildly complex pancreatic cyst. Spleen:  Unremarkable. Adrenals/Urinary Tract: Bilateral kidneys and bilateral adrenal glands are normal in appearance. No hydroureteronephrosis in the visualized portions of the abdomen. Stomach/Bowel: Visualized portions are unremarkable. Vascular/Lymphatic: No aneurysm identified in the visualized abdominal vasculature. No lymphadenopathy noted in the  abdomen. Other: Small amount of retroperitoneal fluid most evident adjacent to the pancreas. No significant volume of ascites noted in the visualized portions of the peritoneal cavity. Musculoskeletal: No aggressive appearing osseous lesions are noted in the visualized portions of the skeleton. IMPRESSION: 1. 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 This is exerting mass effect locally causing pancreatic ductal dilatation. There are surrounding inflammatory changes adjacent to the pancreas indicative of residual acute pancreatitis. 2. Hepatic steatosis. 3. Trace bilateral pleural effusions lying dependently. Electronically Signed   By: Vinnie Langton M.D.   On: 02/28/2020 14:20   Portable chest 1 View  Result Date: 02/28/2020 CLINICAL DATA:  Severe sepsis EXAM: PORTABLE CHEST 1 VIEW COMPARISON:  Yesterday FINDINGS: Normal heart size and mediastinal contours. Mild scarring or atelectasis. No acute infiltrate or edema. No effusion or pneumothorax. No acute osseous findings. IMPRESSION: No active disease. Electronically Signed   By: Monte Fantasia M.D.   On: 02/28/2020 06:33   DG Chest Port 1 View  Result Date: 02/27/2020 CLINICAL DATA:  Chest and abdominal pain EXAM: PORTABLE CHEST 1 VIEW COMPARISON:  Chest radiograph January 10, 2012 FINDINGS: There is slight atelectatic change in the bases. Lungs otherwise are clear. Heart size and pulmonary vascularity are normal. No adenopathy. There is aortic atherosclerosis. No bone lesions. IMPRESSION: Mild bibasilar atelectasis. Lungs otherwise clear. Cardiac silhouette normal. Aortic Atherosclerosis (ICD10-I70.0). Electronically Signed   By: Lowella Grip III M.D.   On: 02/27/2020 08:29   MR ABDOMEN MRCP W WO CONTAST  Result Date: 02/28/2020 CLINICAL DATA:  64 year old female with history of pancreatic lesion noted on prior CT examination. EXAM: MRI  ABDOMEN WITHOUT AND WITH CONTRAST (INCLUDING MRCP) TECHNIQUE: Multiplanar multisequence MR imaging of the abdomen was performed both before and after the administration of intravenous contrast. Heavily T2-weighted images of the biliary and pancreatic ducts were obtained, and three-dimensional MRCP images were rendered by post processing. CONTRAST:  34mL GADAVIST GADOBUTROL 1 MMOL/ML IV SOLN COMPARISON:  No prior abdominal MRI. CT the abdomen and pelvis 02/27/2020. FINDINGS: Comment: Portions of today's examination is considerably limited by extensive patient respiratory motion. Lower chest: Trace bilateral pleural effusions lying dependently. Hepatobiliary: Diffuse loss of signal intensity throughout the hepatic parenchyma on out of phase dual echo images indicative of hepatic steatosis. In the inferior aspect of segment 6 of the liver (axial image 29 of series 21 and coronal image 13 of series 4) there is a well-defined 3.4 x 3.2 x 4.2 cm T1 hypointense, T2 hyperintense, nonenhancing lesion, compatible with a simple cyst. No other suspicious hepatic lesions. No intra or extrahepatic biliary ductal dilatation. Gallbladder is normal in appearance. Pancreas: In the head and proximal body of the pancreas (axial image 36 of series 18 and coronal image 55 of series 20) there is a large lesion that is heterogeneous in T1 and T2 signal intensity, with predominantly T1 hypointensity the non dependently and T1 isointensity dependently with T2 hyperintensity non dependently and T2 hypointensity dependently, but no evidence of internal enhancement, favored to represent a mildly complex pancreatic cyst. Spleen:  Unremarkable. Adrenals/Urinary Tract: Bilateral  kidneys and bilateral adrenal glands are normal in appearance. No hydroureteronephrosis in the visualized portions of the abdomen. Stomach/Bowel: Visualized portions are unremarkable. Vascular/Lymphatic: No aneurysm identified in the visualized abdominal vasculature. No  lymphadenopathy noted in the abdomen. Other: Small amount of retroperitoneal fluid most evident adjacent to the pancreas. No significant volume of ascites noted in the visualized portions of the peritoneal cavity. Musculoskeletal: No aggressive appearing osseous lesions are noted in the visualized portions of the skeleton. IMPRESSION: 1. 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 This is exerting mass effect locally causing pancreatic ductal dilatation. There are surrounding inflammatory changes adjacent to the pancreas indicative of residual acute pancreatitis. 2. Hepatic steatosis. 3. Trace bilateral pleural effusions lying dependently. Electronically Signed   By: Vinnie Langton M.D.   On: 02/28/2020 14:20  [2 weeks]   Assessment:  64 y/o female with history of HTN, etoh abuse presenting to ED via EMS 02/27/20 due to abdominal pain, poor oral intake. Noted to have hyponatremia with sodium of 122, worsening renal function with creatinine of 1.47. Lactic acid 5.5, lipase 122. CT with acute on chronic pancreatitis with mildly complex pseudocyst. She was admitted with sepsis with UTI (Ecoli) and bacteremia (Streptococcus species). Currently on Rocephin.   Acute on chronic pancreatitis: suspected etoh related. CT with ill-defined mass proximal body of pancreas 3.2X3.2cm. No pancreatic or CBD dilation. MRI/MRCP this admission with lesion of concern in the head and proximal body of pancreas most c/w large pancreatic pseudocyst with some local mass effect causing pancreatic ductal dilation. Residual acute pancreatitis noted.   Anemia: Hgb 10.2 on admission, down to 8.7 but stable at 8.9. likely dilutional. No overt GI bleeding noted. Mild normocytic anemia dating back to at least 09/2016. No prior colonoscopy. EGD in 07/2019 with LA grade C esophagitis, gastritis, no H.pylori. B12 normal, iron/iron sats low  normal, ferritin elevated likely in part due to etoh use and acute phase reactant in setting of infection. Suspect anemia multifactorial in setting of malnutrition, chronic etoh abuse. Will continue to monitor.    Plan: 1. Continue pantoprazole. 2. Trial of pancreatitic enzymes. 3. ETOH cessation recommended.  4. Recommend outpatient follow up, based on clinical response she may require repeat imaging of pancreatic pseudocyst. 5. Advance to low fat diet.   6. Will follow peripherally.  Laureen Ochs. Bernarda Caffey [facility] [phone] 6/17/20211:13 PM     LOS: 3 days

## 2020-03-01 NOTE — TOC Progression Note (Signed)
Transition of Care [facility]) - Progression Note    Patient Details  Name: Judy Davis MRN: [id number] Date of Birth: Feb 03, 1956  Transition of Care [facility]) CM/SW Contact  Ihor Gully, LCSW Phone Number: 03/01/2020, 4:48 PM  Clinical Narrative:    Patient will discharge on IV abx. Carolynn Sayers with [facility] messaged regarding patient.    Expected Discharge Plan: [place] Barriers to Discharge: Continued Medical Work up  Expected Discharge Plan and Services Expected Discharge Plan: [place] In-house Referral: Clinical Social Work   Post Acute Care Choice: [place] arrangements for the past 2 months: Single Family Home                 DME Arranged: [place] rolling DME Agency: [employer] Date DME Agency Contacted: 02/28/20 Time DME Agency Contacted: [phone] Representative spoke with at DME Agency: [place] ([place]) Interventions    Readmission Risk Interventions No flowsheet data found.

## 2020-03-01 NOTE — Progress Notes (Signed)
*  PRELIMINARY RESULTS* Echocardiogram 2D Echocardiogram has been performed.  Judy Davis 03/01/2020, 9:53 AM

## 2020-03-01 NOTE — Progress Notes (Signed)
Physical Therapy Treatment Patient Details Name: Judy Davis MRN: [id number] DOB: April 13, 1956 Today's Date: 03/01/2020    History of Present Illness 64 y.o. female with history of hypertension and chronic alcoholism was sent to the ED by family members when her brother noted that the past several days she has had poor oral intake and has been complaining of abdominal pain.  She is not eating or drinking well.  She has been weak and frail and not ambulating.  Lying in the bed most of the time.  She denies fever and chills.  She is an alcohol consumer but reports that she only drinks several days out of the week and has never had delirium tremens.  She denies having weight loss.  She denies diarrhea.  She denies chest pain and shortness of breath.  Her abdominal pain seems to be worsened with eating and that is why she has not been eating that much.  She describes her pain as sharp radiating into the back with burning and constantly aching.  She had an EGD by Dr. Oneida Alar in November 2020 with findings of grade C esophagitis and gastritis.  H. pylori was negative.    PT Comments    Pt friendly and willing to participate.  Independent with bed mobilty and min guard with transfer training.  Pt able to transfer to [facility] with 1 HHA safely with min cueing for hand use.  Steady static standing with ability to wash hands.  Presents with unsteadiness with gait so used RW with min guard/A during gait.  Able to ambulate 40 feet today with slow cadence and wide turns, no LOB episodes noted.  EOS pt was limited by fatigue.  Left in chair with call bell within reach and chair alarm set.  RN aware of status.     Follow Up Recommendations  Home health PT;Supervision - Intermittent;Supervision for mobility/OOB     Equipment Recommendations  Rolling walker with 5" wheels    Recommendations for Other Services       Precautions / Restrictions Restrictions Weight Bearing Restrictions: No    Mobility  Bed  Mobility Overal bed mobility: Independent             General bed mobility comments: pt sitting on EOB  Transfers Overall transfer level: Modified independent Equipment used: 1 person hand held assist;Rolling walker (2 wheeled) Transfers: Sit to/from Stand Sit to Stand: Min guard         General transfer comment: stable upon static standing with RW assistance.  Unsteady without RW assistance during gait.  Ambulation/Gait Ambulation/Gait assistance: Min assist;Min guard Gait Distance (Feet): 40 Feet Assistive device: Rolling walker (2 wheeled) Gait Pattern/deviations: Step-to pattern;Decreased stride length;Staggering left;Staggering right Gait velocity: decreased   General Gait Details: Steady gait with RW assistance, no LOB.  Slow cadence.   Stairs             Wheelchair Mobility    Modified Rankin (Stroke Patients Only)       Balance                                            Cognition Arousal/Alertness: Awake/alert Behavior During Therapy: WFL for tasks assessed/performed;Flat affect Overall Cognitive Status: No family/caregiver present to determine baseline cognitive functioning  Exercises      General Comments        Pertinent Vitals/Pain      Home Living                      Prior Function            PT Goals (current goals can now be found in the care plan section)      Frequency    Min 3X/week      PT Plan      Co-evaluation              AM-PAC PT "6 Clicks" Mobility   Outcome Measure  Help needed turning from your back to your side while in a flat bed without using bedrails?: None Help needed moving from lying on your back to sitting on the side of a flat bed without using bedrails?: None Help needed moving to and from a bed to a chair (including a wheelchair)?: A Little Help needed standing up from a chair using your arms (e.g.,  wheelchair or bedside chair)?: A Little Help needed to walk in hospital room?: A Little Help needed climbing 3-5 steps with a railing? : A Little 6 Click Score: 20    End of Session Equipment Utilized During Treatment: Gait belt Activity Tolerance: Patient tolerated treatment well Patient left: in chair;with call bell/phone within reach;with chair alarm set Nurse Communication: Mobility status PT Visit Diagnosis: Unsteadiness on feet (R26.81);Other abnormalities of gait and mobility (R26.89)     Time: 6468-0321 PT Time Calculation (min) (ACUTE ONLY): 25 min  Charges:  $Therapeutic Activity: 23-37 mins                    Ihor Austin, LPTA/CLT; CBIS [phone]  Aldona Lento 03/01/2020, 1:00 PM

## 2020-03-01 NOTE — Progress Notes (Addendum)
PROGRESS NOTE  Judy Davis UUV:[id number] DOB: 28-Apr-1956 DOA: 02/27/2020 PCP: Judy Davis  Brief History:  64 y.o.femalewith history of hypertension and chronic alcoholism was sent to the ED by family members when her brother noted that the past several days she has had poor oral intake and has been complaining of abdominal pain. She is not eating or drinking well. She has been weak and frail and not ambulating. Lying in the bed most of the time. Her abdominal pain seems to be worsened with eating and that is why she has not been eating that much. She describes her pain as sharp radiating into the back with burning and constantly aching. She had an EGD by Dr. Oneida Alar in November 2020 with findings of grade C esophagitis and gastritis. H. pylori was negative.  The patient was found to have sepsis secondary to bacteremia and UTI.  The patient also was noted to have an acute on chronic pancreatitis exacerbation.  GI was consulted to assist with management.   Assessment/Plan: Sepsis -Present on admission -Secondary to bacteremia and UTI -Continue ceftriaxone -Lactic acid peaked at 5.5 -Continue IV fluids>>saline lock -UA 21-50 WBC -Personally reviewed chest x-ray--no consolidations  Streptococcal bacteremia--viridans strep -Source unclear -Repeat blood cultures neg to date -Echocardiogram-EF 60-65%, no vegetation, no WMA -Continue ceftriaxone 2 g daily  UTI -Urine culture growing E. 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 with mass-effect causing dilated pancreatic duct; surrounding inflammation adjacent to the pancreas consistent with residual acute pancreatitis -Tolerating clear liquids  Hyponatremia -d/c IVF -urine Osm -serum osm -urine  Na -urine creatinine -TSH 4.742  Acute metabolic encephalopathy -Secondary to sepsis/infectious process -Overall improved  Hypokalemia/Hypomagnesemia -Replete  Hypertension -Patient initially had soft blood pressures  Vitamin D deficiency -Started on Drisdol      Status is: Inpatient  Remains inpatient appropriate because:IV treatments appropriate due to intensity of illness or inability to take PO   Dispo: The patient is from: Home  Anticipated d/c is to: Home  Anticipated d/c date is: 2 days  Patient currently is not medically stable to d/c.        Family Communication:  no Family at bedside  Consultants:  GI  Code Status:  FULL   DVT Prophylaxis:   Greenback Lovenox   Procedures: As Listed in Progress Note Above  Antibiotics: Ceftriaxone 6/14>>> Metronidazole 6/14>>6/16      Subjective: Patient denies fevers, chills, headache, chest pain, dyspnea, nausea, vomiting, diarrhea, abdominal pain, dysuria, hematuria, hematochezia, and melena.   Objective: Vitals:   03/01/20 0440 03/01/20 1009 03/01/20 1200 03/01/20 1417  BP: (!) 145/90 (!) 147/84 133/86 (!) 143/72  Pulse: 77 82 79 72  Resp:  18 20 19   Temp: 98.1 F (36.7 C) 98.4 F (36.9 C) 98.6 F (37 C) 98.5 F (36.9 C)  TempSrc: Oral  Oral Oral  SpO2: 100% 100% 100% 100%  Weight:      Height:        Intake/Output Summary (Last 24 hours) at 03/01/2020 1723 Last data filed at 03/01/2020 1658 Gross per 24 hour  Intake 1106.59 ml  Output 1175 ml  Net -68.41 ml   Weight change:  Exam:   General:  Pt is alert, follows commands appropriately, not in acute distress  HEENT: No icterus, No thrush, No  PROGRESS NOTE  Judy Davis UUV:[id number] DOB: 28-Apr-1956 DOA: 02/27/2020 PCP: Judy Davis  Brief History:  64 y.o.femalewith history of hypertension and chronic alcoholism was sent to the ED by family members when her brother noted that the past several days she has had poor oral intake and has been complaining of abdominal pain. She is not eating or drinking well. She has been weak and frail and not ambulating. Lying in the bed most of the time. Her abdominal pain seems to be worsened with eating and that is why she has not been eating that much. She describes her pain as sharp radiating into the back with burning and constantly aching. She had an EGD by Dr. Oneida Alar in November 2020 with findings of grade C esophagitis and gastritis. H. pylori was negative.  The patient was found to have sepsis secondary to bacteremia and UTI.  The patient also was noted to have an acute on chronic pancreatitis exacerbation.  GI was consulted to assist with management.   Assessment/Plan: Sepsis -Present on admission -Secondary to bacteremia and UTI -Continue ceftriaxone -Lactic acid peaked at 5.5 -Continue IV fluids>>saline lock -UA 21-50 WBC -Personally reviewed chest x-ray--no consolidations  Streptococcal bacteremia--viridans strep -Source unclear -Repeat blood cultures neg to date -Echocardiogram-EF 60-65%, no vegetation, no WMA -Continue ceftriaxone 2 g daily  UTI -Urine culture growing E. 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 with mass-effect causing dilated pancreatic duct; surrounding inflammation adjacent to the pancreas consistent with residual acute pancreatitis -Tolerating clear liquids  Hyponatremia -d/c IVF -urine Osm -serum osm -urine  Na -urine creatinine -TSH 4.742  Acute metabolic encephalopathy -Secondary to sepsis/infectious process -Overall improved  Hypokalemia/Hypomagnesemia -Replete  Hypertension -Patient initially had soft blood pressures  Vitamin D deficiency -Started on Drisdol      Status is: Inpatient  Remains inpatient appropriate because:IV treatments appropriate due to intensity of illness or inability to take PO   Dispo: The patient is from: Home  Anticipated d/c is to: Home  Anticipated d/c date is: 2 days  Patient currently is not medically stable to d/c.        Family Communication:  no Family at bedside  Consultants:  GI  Code Status:  FULL   DVT Prophylaxis:   Greenback Lovenox   Procedures: As Listed in Progress Note Above  Antibiotics: Ceftriaxone 6/14>>> Metronidazole 6/14>>6/16      Subjective: Patient denies fevers, chills, headache, chest pain, dyspnea, nausea, vomiting, diarrhea, abdominal pain, dysuria, hematuria, hematochezia, and melena.   Objective: Vitals:   03/01/20 0440 03/01/20 1009 03/01/20 1200 03/01/20 1417  BP: (!) 145/90 (!) 147/84 133/86 (!) 143/72  Pulse: 77 82 79 72  Resp:  18 20 19   Temp: 98.1 F (36.7 C) 98.4 F (36.9 C) 98.6 F (37 C) 98.5 F (36.9 C)  TempSrc: Oral  Oral Oral  SpO2: 100% 100% 100% 100%  Weight:      Height:        Intake/Output Summary (Last 24 hours) at 03/01/2020 1723 Last data filed at 03/01/2020 1658 Gross per 24 hour  Intake 1106.59 ml  Output 1175 ml  Net -68.41 ml   Weight change:  Exam:   General:  Pt is alert, follows commands appropriately, not in acute distress  HEENT: No icterus, No thrush, No  non dependently and T2 hypointensity dependently, but no evidence of internal enhancement, favored to represent a mildly complex pancreatic cyst. Spleen:  Unremarkable. Adrenals/Urinary Tract: Bilateral kidneys and bilateral adrenal glands are normal in appearance. No hydroureteronephrosis in the visualized portions of the abdomen. Stomach/Bowel: Visualized portions are unremarkable. Vascular/Lymphatic: No aneurysm identified in the visualized abdominal vasculature. No lymphadenopathy noted in the abdomen. Other: Small amount of retroperitoneal fluid most evident adjacent to the pancreas. No  significant volume of ascites noted in the visualized portions of the peritoneal cavity. Musculoskeletal: No aggressive appearing osseous lesions are noted in the visualized portions of the skeleton. IMPRESSION: 1. 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 This is exerting mass effect locally causing pancreatic ductal dilatation. There are surrounding inflammatory changes adjacent to the pancreas indicative of residual acute pancreatitis. 2. Hepatic steatosis. 3. Trace bilateral pleural effusions lying dependently. Electronically Signed   By: Vinnie Langton M.D.   On: 02/28/2020 14:20   ECHOCARDIOGRAM COMPLETE  Result Date: 03/01/2020    ECHOCARDIOGRAM REPORT   Patient Name:   Judy Davis Date of Exam: 03/01/2020 Medical Rec #:  [id number]         Height:       66.0 in Accession #:    [id number]        Weight:       108.0 lb Date of Birth:  23-Oct-1955          BSA:          1.539 m Patient Age:    31 years          BP:           145/90 mmHg Patient Gender: F                 HR:           77 bpm. Exam Location:  [facility] Procedure: 2D Echo Indications:    Bacteremia 790.7 / R78.81  History:        Patient has no prior history of Echocardiogram examinations.                 Signs/Symptoms:Bacteremia; Risk Factors:Hypertension and                 Non-Smoker. ETOH, Lactic acidosis, Bacteremia due to                 Gram-positive bacteria.  Sonographer:    Leavy Cella RDCS (AE) Referring Phys: [id number] Igor Bishop IMPRESSIONS  1. Left ventricular ejection fraction, by estimation, is 60 to 65%. The left ventricle has normal function. The left ventricle has no regional wall motion abnormalities. Left ventricular diastolic parameters were normal.  2. Right ventricular systolic function is normal. The right ventricular size is normal.  3. The mitral valve is normal in structure. No evidence of mitral valve  regurgitation. No evidence of mitral stenosis.  4. The aortic valve is normal in structure. Aortic valve regurgitation is trivial. No aortic stenosis is present.  5. The inferior vena cava is normal in size with greater than 50% respiratory variability, suggesting right atrial pressure of 3 mmHg. FINDINGS  Left Ventricle: Left ventricular ejection fraction, by estimation, is 60 to 65%. The left ventricle has normal function. The left ventricle has no regional wall motion abnormalities. The left ventricular internal cavity size was normal in size. There is  no left ventricular  non dependently and T2 hypointensity dependently, but no evidence of internal enhancement, favored to represent a mildly complex pancreatic cyst. Spleen:  Unremarkable. Adrenals/Urinary Tract: Bilateral kidneys and bilateral adrenal glands are normal in appearance. No hydroureteronephrosis in the visualized portions of the abdomen. Stomach/Bowel: Visualized portions are unremarkable. Vascular/Lymphatic: No aneurysm identified in the visualized abdominal vasculature. No lymphadenopathy noted in the abdomen. Other: Small amount of retroperitoneal fluid most evident adjacent to the pancreas. No  significant volume of ascites noted in the visualized portions of the peritoneal cavity. Musculoskeletal: No aggressive appearing osseous lesions are noted in the visualized portions of the skeleton. IMPRESSION: 1. 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 This is exerting mass effect locally causing pancreatic ductal dilatation. There are surrounding inflammatory changes adjacent to the pancreas indicative of residual acute pancreatitis. 2. Hepatic steatosis. 3. Trace bilateral pleural effusions lying dependently. Electronically Signed   By: Vinnie Langton M.D.   On: 02/28/2020 14:20   ECHOCARDIOGRAM COMPLETE  Result Date: 03/01/2020    ECHOCARDIOGRAM REPORT   Patient Name:   Judy Davis Date of Exam: 03/01/2020 Medical Rec #:  [id number]         Height:       66.0 in Accession #:    [id number]        Weight:       108.0 lb Date of Birth:  23-Oct-1955          BSA:          1.539 m Patient Age:    31 years          BP:           145/90 mmHg Patient Gender: F                 HR:           77 bpm. Exam Location:  [facility] Procedure: 2D Echo Indications:    Bacteremia 790.7 / R78.81  History:        Patient has no prior history of Echocardiogram examinations.                 Signs/Symptoms:Bacteremia; Risk Factors:Hypertension and                 Non-Smoker. ETOH, Lactic acidosis, Bacteremia due to                 Gram-positive bacteria.  Sonographer:    Leavy Cella RDCS (AE) Referring Phys: [id number] Igor Bishop IMPRESSIONS  1. Left ventricular ejection fraction, by estimation, is 60 to 65%. The left ventricle has normal function. The left ventricle has no regional wall motion abnormalities. Left ventricular diastolic parameters were normal.  2. Right ventricular systolic function is normal. The right ventricular size is normal.  3. The mitral valve is normal in structure. No evidence of mitral valve  regurgitation. No evidence of mitral stenosis.  4. The aortic valve is normal in structure. Aortic valve regurgitation is trivial. No aortic stenosis is present.  5. The inferior vena cava is normal in size with greater than 50% respiratory variability, suggesting right atrial pressure of 3 mmHg. FINDINGS  Left Ventricle: Left ventricular ejection fraction, by estimation, is 60 to 65%. The left ventricle has normal function. The left ventricle has no regional wall motion abnormalities. The left ventricular internal cavity size was normal in size. There is  no left ventricular  PROGRESS NOTE  Judy Davis UUV:[id number] DOB: 28-Apr-1956 DOA: 02/27/2020 PCP: Judy Davis  Brief History:  64 y.o.femalewith history of hypertension and chronic alcoholism was sent to the ED by family members when her brother noted that the past several days she has had poor oral intake and has been complaining of abdominal pain. She is not eating or drinking well. She has been weak and frail and not ambulating. Lying in the bed most of the time. Her abdominal pain seems to be worsened with eating and that is why she has not been eating that much. She describes her pain as sharp radiating into the back with burning and constantly aching. She had an EGD by Dr. Oneida Alar in November 2020 with findings of grade C esophagitis and gastritis. H. pylori was negative.  The patient was found to have sepsis secondary to bacteremia and UTI.  The patient also was noted to have an acute on chronic pancreatitis exacerbation.  GI was consulted to assist with management.   Assessment/Plan: Sepsis -Present on admission -Secondary to bacteremia and UTI -Continue ceftriaxone -Lactic acid peaked at 5.5 -Continue IV fluids>>saline lock -UA 21-50 WBC -Personally reviewed chest x-ray--no consolidations  Streptococcal bacteremia--viridans strep -Source unclear -Repeat blood cultures neg to date -Echocardiogram-EF 60-65%, no vegetation, no WMA -Continue ceftriaxone 2 g daily  UTI -Urine culture growing E. 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 with mass-effect causing dilated pancreatic duct; surrounding inflammation adjacent to the pancreas consistent with residual acute pancreatitis -Tolerating clear liquids  Hyponatremia -d/c IVF -urine Osm -serum osm -urine  Na -urine creatinine -TSH 4.742  Acute metabolic encephalopathy -Secondary to sepsis/infectious process -Overall improved  Hypokalemia/Hypomagnesemia -Replete  Hypertension -Patient initially had soft blood pressures  Vitamin D deficiency -Started on Drisdol      Status is: Inpatient  Remains inpatient appropriate because:IV treatments appropriate due to intensity of illness or inability to take PO   Dispo: The patient is from: Home  Anticipated d/c is to: Home  Anticipated d/c date is: 2 days  Patient currently is not medically stable to d/c.        Family Communication:  no Family at bedside  Consultants:  GI  Code Status:  FULL   DVT Prophylaxis:   Greenback Lovenox   Procedures: As Listed in Progress Note Above  Antibiotics: Ceftriaxone 6/14>>> Metronidazole 6/14>>6/16      Subjective: Patient denies fevers, chills, headache, chest pain, dyspnea, nausea, vomiting, diarrhea, abdominal pain, dysuria, hematuria, hematochezia, and melena.   Objective: Vitals:   03/01/20 0440 03/01/20 1009 03/01/20 1200 03/01/20 1417  BP: (!) 145/90 (!) 147/84 133/86 (!) 143/72  Pulse: 77 82 79 72  Resp:  18 20 19   Temp: 98.1 F (36.7 C) 98.4 F (36.9 C) 98.6 F (37 C) 98.5 F (36.9 C)  TempSrc: Oral  Oral Oral  SpO2: 100% 100% 100% 100%  Weight:      Height:        Intake/Output Summary (Last 24 hours) at 03/01/2020 1723 Last data filed at 03/01/2020 1658 Gross per 24 hour  Intake 1106.59 ml  Output 1175 ml  Net -68.41 ml   Weight change:  Exam:   General:  Pt is alert, follows commands appropriately, not in acute distress  HEENT: No icterus, No thrush, No  PROGRESS NOTE  Judy Davis UUV:[id number] DOB: 28-Apr-1956 DOA: 02/27/2020 PCP: Judy Davis  Brief History:  64 y.o.femalewith history of hypertension and chronic alcoholism was sent to the ED by family members when her brother noted that the past several days she has had poor oral intake and has been complaining of abdominal pain. She is not eating or drinking well. She has been weak and frail and not ambulating. Lying in the bed most of the time. Her abdominal pain seems to be worsened with eating and that is why she has not been eating that much. She describes her pain as sharp radiating into the back with burning and constantly aching. She had an EGD by Dr. Oneida Alar in November 2020 with findings of grade C esophagitis and gastritis. H. pylori was negative.  The patient was found to have sepsis secondary to bacteremia and UTI.  The patient also was noted to have an acute on chronic pancreatitis exacerbation.  GI was consulted to assist with management.   Assessment/Plan: Sepsis -Present on admission -Secondary to bacteremia and UTI -Continue ceftriaxone -Lactic acid peaked at 5.5 -Continue IV fluids>>saline lock -UA 21-50 WBC -Personally reviewed chest x-ray--no consolidations  Streptococcal bacteremia--viridans strep -Source unclear -Repeat blood cultures neg to date -Echocardiogram-EF 60-65%, no vegetation, no WMA -Continue ceftriaxone 2 g daily  UTI -Urine culture growing E. 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 with mass-effect causing dilated pancreatic duct; surrounding inflammation adjacent to the pancreas consistent with residual acute pancreatitis -Tolerating clear liquids  Hyponatremia -d/c IVF -urine Osm -serum osm -urine  Na -urine creatinine -TSH 4.742  Acute metabolic encephalopathy -Secondary to sepsis/infectious process -Overall improved  Hypokalemia/Hypomagnesemia -Replete  Hypertension -Patient initially had soft blood pressures  Vitamin D deficiency -Started on Drisdol      Status is: Inpatient  Remains inpatient appropriate because:IV treatments appropriate due to intensity of illness or inability to take PO   Dispo: The patient is from: Home  Anticipated d/c is to: Home  Anticipated d/c date is: 2 days  Patient currently is not medically stable to d/c.        Family Communication:  no Family at bedside  Consultants:  GI  Code Status:  FULL   DVT Prophylaxis:   Greenback Lovenox   Procedures: As Listed in Progress Note Above  Antibiotics: Ceftriaxone 6/14>>> Metronidazole 6/14>>6/16      Subjective: Patient denies fevers, chills, headache, chest pain, dyspnea, nausea, vomiting, diarrhea, abdominal pain, dysuria, hematuria, hematochezia, and melena.   Objective: Vitals:   03/01/20 0440 03/01/20 1009 03/01/20 1200 03/01/20 1417  BP: (!) 145/90 (!) 147/84 133/86 (!) 143/72  Pulse: 77 82 79 72  Resp:  18 20 19   Temp: 98.1 F (36.7 C) 98.4 F (36.9 C) 98.6 F (37 C) 98.5 F (36.9 C)  TempSrc: Oral  Oral Oral  SpO2: 100% 100% 100% 100%  Weight:      Height:        Intake/Output Summary (Last 24 hours) at 03/01/2020 1723 Last data filed at 03/01/2020 1658 Gross per 24 hour  Intake 1106.59 ml  Output 1175 ml  Net -68.41 ml   Weight change:  Exam:   General:  Pt is alert, follows commands appropriately, not in acute distress  HEENT: No icterus, No thrush, No  PROGRESS NOTE  Judy Davis UUV:[id number] DOB: 28-Apr-1956 DOA: 02/27/2020 PCP: Judy Davis  Brief History:  64 y.o.femalewith history of hypertension and chronic alcoholism was sent to the ED by family members when her brother noted that the past several days she has had poor oral intake and has been complaining of abdominal pain. She is not eating or drinking well. She has been weak and frail and not ambulating. Lying in the bed most of the time. Her abdominal pain seems to be worsened with eating and that is why she has not been eating that much. She describes her pain as sharp radiating into the back with burning and constantly aching. She had an EGD by Dr. Oneida Alar in November 2020 with findings of grade C esophagitis and gastritis. H. pylori was negative.  The patient was found to have sepsis secondary to bacteremia and UTI.  The patient also was noted to have an acute on chronic pancreatitis exacerbation.  GI was consulted to assist with management.   Assessment/Plan: Sepsis -Present on admission -Secondary to bacteremia and UTI -Continue ceftriaxone -Lactic acid peaked at 5.5 -Continue IV fluids>>saline lock -UA 21-50 WBC -Personally reviewed chest x-ray--no consolidations  Streptococcal bacteremia--viridans strep -Source unclear -Repeat blood cultures neg to date -Echocardiogram-EF 60-65%, no vegetation, no WMA -Continue ceftriaxone 2 g daily  UTI -Urine culture growing E. 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 with mass-effect causing dilated pancreatic duct; surrounding inflammation adjacent to the pancreas consistent with residual acute pancreatitis -Tolerating clear liquids  Hyponatremia -d/c IVF -urine Osm -serum osm -urine  Na -urine creatinine -TSH 4.742  Acute metabolic encephalopathy -Secondary to sepsis/infectious process -Overall improved  Hypokalemia/Hypomagnesemia -Replete  Hypertension -Patient initially had soft blood pressures  Vitamin D deficiency -Started on Drisdol      Status is: Inpatient  Remains inpatient appropriate because:IV treatments appropriate due to intensity of illness or inability to take PO   Dispo: The patient is from: Home  Anticipated d/c is to: Home  Anticipated d/c date is: 2 days  Patient currently is not medically stable to d/c.        Family Communication:  no Family at bedside  Consultants:  GI  Code Status:  FULL   DVT Prophylaxis:   Greenback Lovenox   Procedures: As Listed in Progress Note Above  Antibiotics: Ceftriaxone 6/14>>> Metronidazole 6/14>>6/16      Subjective: Patient denies fevers, chills, headache, chest pain, dyspnea, nausea, vomiting, diarrhea, abdominal pain, dysuria, hematuria, hematochezia, and melena.   Objective: Vitals:   03/01/20 0440 03/01/20 1009 03/01/20 1200 03/01/20 1417  BP: (!) 145/90 (!) 147/84 133/86 (!) 143/72  Pulse: 77 82 79 72  Resp:  18 20 19   Temp: 98.1 F (36.7 C) 98.4 F (36.9 C) 98.6 F (37 C) 98.5 F (36.9 C)  TempSrc: Oral  Oral Oral  SpO2: 100% 100% 100% 100%  Weight:      Height:        Intake/Output Summary (Last 24 hours) at 03/01/2020 1723 Last data filed at 03/01/2020 1658 Gross per 24 hour  Intake 1106.59 ml  Output 1175 ml  Net -68.41 ml   Weight change:  Exam:   General:  Pt is alert, follows commands appropriately, not in acute distress  HEENT: No icterus, No thrush, No  non dependently and T2 hypointensity dependently, but no evidence of internal enhancement, favored to represent a mildly complex pancreatic cyst. Spleen:  Unremarkable. Adrenals/Urinary Tract: Bilateral kidneys and bilateral adrenal glands are normal in appearance. No hydroureteronephrosis in the visualized portions of the abdomen. Stomach/Bowel: Visualized portions are unremarkable. Vascular/Lymphatic: No aneurysm identified in the visualized abdominal vasculature. No lymphadenopathy noted in the abdomen. Other: Small amount of retroperitoneal fluid most evident adjacent to the pancreas. No  significant volume of ascites noted in the visualized portions of the peritoneal cavity. Musculoskeletal: No aggressive appearing osseous lesions are noted in the visualized portions of the skeleton. IMPRESSION: 1. 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 This is exerting mass effect locally causing pancreatic ductal dilatation. There are surrounding inflammatory changes adjacent to the pancreas indicative of residual acute pancreatitis. 2. Hepatic steatosis. 3. Trace bilateral pleural effusions lying dependently. Electronically Signed   By: Vinnie Langton M.D.   On: 02/28/2020 14:20   ECHOCARDIOGRAM COMPLETE  Result Date: 03/01/2020    ECHOCARDIOGRAM REPORT   Patient Name:   Judy Davis Date of Exam: 03/01/2020 Medical Rec #:  [id number]         Height:       66.0 in Accession #:    [id number]        Weight:       108.0 lb Date of Birth:  23-Oct-1955          BSA:          1.539 m Patient Age:    31 years          BP:           145/90 mmHg Patient Gender: F                 HR:           77 bpm. Exam Location:  [facility] Procedure: 2D Echo Indications:    Bacteremia 790.7 / R78.81  History:        Patient has no prior history of Echocardiogram examinations.                 Signs/Symptoms:Bacteremia; Risk Factors:Hypertension and                 Non-Smoker. ETOH, Lactic acidosis, Bacteremia due to                 Gram-positive bacteria.  Sonographer:    Leavy Cella RDCS (AE) Referring Phys: [id number] Igor Bishop IMPRESSIONS  1. Left ventricular ejection fraction, by estimation, is 60 to 65%. The left ventricle has normal function. The left ventricle has no regional wall motion abnormalities. Left ventricular diastolic parameters were normal.  2. Right ventricular systolic function is normal. The right ventricular size is normal.  3. The mitral valve is normal in structure. No evidence of mitral valve  regurgitation. No evidence of mitral stenosis.  4. The aortic valve is normal in structure. Aortic valve regurgitation is trivial. No aortic stenosis is present.  5. The inferior vena cava is normal in size with greater than 50% respiratory variability, suggesting right atrial pressure of 3 mmHg. FINDINGS  Left Ventricle: Left ventricular ejection fraction, by estimation, is 60 to 65%. The left ventricle has normal function. The left ventricle has no regional wall motion abnormalities. The left ventricular internal cavity size was normal in size. There is  no left ventricular  PROGRESS NOTE  Judy Davis UUV:[id number] DOB: 28-Apr-1956 DOA: 02/27/2020 PCP: Judy Davis  Brief History:  64 y.o.femalewith history of hypertension and chronic alcoholism was sent to the ED by family members when her brother noted that the past several days she has had poor oral intake and has been complaining of abdominal pain. She is not eating or drinking well. She has been weak and frail and not ambulating. Lying in the bed most of the time. Her abdominal pain seems to be worsened with eating and that is why she has not been eating that much. She describes her pain as sharp radiating into the back with burning and constantly aching. She had an EGD by Dr. Oneida Alar in November 2020 with findings of grade C esophagitis and gastritis. H. pylori was negative.  The patient was found to have sepsis secondary to bacteremia and UTI.  The patient also was noted to have an acute on chronic pancreatitis exacerbation.  GI was consulted to assist with management.   Assessment/Plan: Sepsis -Present on admission -Secondary to bacteremia and UTI -Continue ceftriaxone -Lactic acid peaked at 5.5 -Continue IV fluids>>saline lock -UA 21-50 WBC -Personally reviewed chest x-ray--no consolidations  Streptococcal bacteremia--viridans strep -Source unclear -Repeat blood cultures neg to date -Echocardiogram-EF 60-65%, no vegetation, no WMA -Continue ceftriaxone 2 g daily  UTI -Urine culture growing E. 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 with mass-effect causing dilated pancreatic duct; surrounding inflammation adjacent to the pancreas consistent with residual acute pancreatitis -Tolerating clear liquids  Hyponatremia -d/c IVF -urine Osm -serum osm -urine  Na -urine creatinine -TSH 4.742  Acute metabolic encephalopathy -Secondary to sepsis/infectious process -Overall improved  Hypokalemia/Hypomagnesemia -Replete  Hypertension -Patient initially had soft blood pressures  Vitamin D deficiency -Started on Drisdol      Status is: Inpatient  Remains inpatient appropriate because:IV treatments appropriate due to intensity of illness or inability to take PO   Dispo: The patient is from: Home  Anticipated d/c is to: Home  Anticipated d/c date is: 2 days  Patient currently is not medically stable to d/c.        Family Communication:  no Family at bedside  Consultants:  GI  Code Status:  FULL   DVT Prophylaxis:   [place] Lovenox   Procedures: As Listed in Progress Note Above  Antibiotics: Ceftriaxone 6/14>>> Metronidazole 6/14>>6/16      Subjective: Patient denies fevers, chills, headache, chest pain, dyspnea, nausea, vomiting, diarrhea, abdominal pain, dysuria, hematuria, hematochezia, and melena.   Objective: Vitals:   03/01/20 0440 03/01/20 1009 03/01/20 1200 03/01/20 1417  BP: (!) 145/90 (!) 147/84 133/86 (!) 143/72  Pulse: 77 82 79 72  Resp:  18 20 19   Temp: 98.1 F (36.7 C) 98.4 F (36.9 C) 98.6 F (37 C) 98.5 F (36.9 C)  TempSrc: Oral  Oral Oral  SpO2: 100% 100% 100% 100%  Weight:      Height:        Intake/Output Summary (Last 24 hours) at 03/01/2020 1723 Last data filed at 03/01/2020 1658 Gross per 24 hour  Intake 1106.59 ml  Output 1175 ml  Net -68.41 ml   Weight change:  Exam:   General:  Pt is alert, follows commands appropriately, not in acute distress  HEENT: No icterus, No thrush, No

## 2020-03-02 ENCOUNTER — Inpatient Hospital Stay: Payer: Self-pay

## 2020-03-02 LAB — CBC WITH DIFFERENTIAL/PLATELET
Abs Immature Granulocytes: 0.05 10*3/uL (ref 0.00–0.07)
Basophils Absolute: 0 10*3/uL (ref 0.0–0.1)
Basophils Relative: 1 %
Eosinophils Absolute: 0.1 10*3/uL (ref 0.0–0.5)
Eosinophils Relative: 2 %
HCT: 27.4 % — ABNORMAL LOW (ref 36.0–46.0)
Hemoglobin: 8.8 g/dL — ABNORMAL LOW (ref 12.0–15.0)
Immature Granulocytes: 1 %
Lymphocytes Relative: 35 %
Lymphs Abs: 2.1 10*3/uL (ref 0.7–4.0)
MCH: 31.8 pg (ref 26.0–34.0)
MCHC: 32.1 g/dL (ref 30.0–36.0)
MCV: 98.9 fL (ref 80.0–100.0)
Monocytes Absolute: 0.6 10*3/uL (ref 0.1–1.0)
Monocytes Relative: 10 %
Neutro Abs: 3.1 10*3/uL (ref 1.7–7.7)
Neutrophils Relative %: 51 %
Platelets: 329 10*3/uL (ref 150–400)
RBC: 2.77 MIL/uL — ABNORMAL LOW (ref 3.87–5.11)
RDW: 14.6 % (ref 11.5–15.5)
WBC: 6 10*3/uL (ref 4.0–10.5)
nRBC: 0 % (ref 0.0–0.2)

## 2020-03-02 LAB — CULTURE, BLOOD (ROUTINE X 2)

## 2020-03-02 LAB — COMPREHENSIVE METABOLIC PANEL
ALT: 11 U/L (ref 0–44)
AST: 22 U/L (ref 15–41)
Albumin: 2.4 g/dL — ABNORMAL LOW (ref 3.5–5.0)
Alkaline Phosphatase: 65 U/L (ref 38–126)
Anion gap: 6 (ref 5–15)
BUN: <5 mg/dL — ABNORMAL LOW (ref 8–23)
CO2: 25 mmol/L (ref 22–32)
Calcium: 8.4 mg/dL — ABNORMAL LOW (ref 8.9–10.3)
Chloride: 96 mmol/L — ABNORMAL LOW (ref 98–111)
Creatinine, Ser: 0.49 mg/dL (ref 0.44–1.00)
GFR calc Af Amer: >60 mL/min (ref >60)
GFR calc non Af Amer: >60 mL/min (ref >60)
Glucose, Bld: 99 mg/dL (ref 70–99)
Potassium: 3.7 mmol/L (ref 3.5–5.1)
Sodium: 127 mmol/L — ABNORMAL LOW (ref 135–145)
Total Bilirubin: 0.3 mg/dL (ref 0.3–1.2)
Total Protein: 5.9 g/dL — ABNORMAL LOW (ref 6.5–8.1)

## 2020-03-02 LAB — CANCER ANTIGEN 19-9: CA 19-9: 74 U/mL — ABNORMAL HIGH (ref 0–35)

## 2020-03-02 LAB — CORTISOL-AM, BLOOD: Cortisol - AM: 12.7 ug/dL (ref 6.7–22.6)

## 2020-03-02 LAB — OSMOLALITY, URINE: Osmolality, Ur: 335 mOsm/kg (ref 300–900)

## 2020-03-02 LAB — OSMOLALITY: Osmolality: 271 mOsm/kg — ABNORMAL LOW (ref 275–295)

## 2020-03-02 LAB — MAGNESIUM: Magnesium: 1.6 mg/dL — ABNORMAL LOW (ref 1.7–2.4)

## 2020-03-02 MED ORDER — VITAMIN D (ERGOCALCIFEROL) 1.25 MG (50000 UNIT) PO CAPS: 50000.0000 [IU] | ORAL_CAPSULE | ORAL | 1 refills | Status: DC

## 2020-03-02 MED ORDER — MAGNESIUM SULFATE 2 GM/50ML IV SOLN
2.0000 g | Freq: Once | INTRAVENOUS | Status: AC
  Administered 2020-03-02: 2 g via INTRAVENOUS
  Filled 2020-03-02: qty 50

## 2020-03-02 MED ORDER — CEFTRIAXONE IV (FOR PTA / DISCHARGE USE ONLY): 2.0000 g | INTRAVENOUS | 0 refills | Status: DC

## 2020-03-02 NOTE — Progress Notes (Signed)
PHARMACY CONSULT NOTE FOR:  OUTPATIENT  PARENTERAL ANTIBIOTIC THERAPY (OPAT)  Indication: bacteremia  Regimen: Ceftriaxone 2000 mg IV every 24 hours. End date: Last day of therapy June 26th  IV antibiotic discharge orders are pended. To discharging provider:  please sign these orders via discharge navigator,  Select New Orders & click on the button choice - Manage This Unsigned Work.     Thank you for allowing pharmacy to be a part of this patient's care.  Ramond Craver 03/02/2020, 10:37 AM

## 2020-03-02 NOTE — TOC Transition Note (Signed)
Transition of Care [facility]) - CM/SW Discharge Note   Patient Details  Name: Judy Davis MRN: [id number] Date of Birth: 1955-11-07  [facility]) CM/SW Contact:  Shade Flood, LCSW Phone Number: 03/02/2020, 3:08 PM   Clinical Narrative:     Pt stable for dc today per MD. [facility] providing IV anbx for dc and they arranged RN with [facility] to care for the line. [facility] will [facility] pt for [facility] PT. There are no other TOC needs for dc.   Final next level of care: [place] Barriers to Discharge: Barriers Resolved   Patient Goals and CMS Choice Patient states their goals for this hospitalization and ongoing recovery are:: return home CMS Medicare.gov Compare Post Acute Care list provided to:: Patient Choice offered to / list presented to : Patient  Discharge Placement                       Discharge Plan and Services In-house Referral: Clinical Social Work   Post Acute Care Choice: Home Health          DME Arranged: Gilford Rile rolling DME Agency: [employer] Date DME Agency Contacted: 02/28/20 Time DME Agency Contacted: 1528 Representative spoke with at DME Agency: [place]: PT [place]: [place] ([place]) Date [place]: 03/02/20   Representative spoke with at [place]: [place] ([place]) Interventions     Readmission Risk Interventions No flowsheet data found.

## 2020-03-02 NOTE — Care Management Important Message (Signed)
Important Message  Patient Details  Name: Judy Davis MRN: [id number] Date of Birth: 1956/01/05   Medicare Important Message Given:  Yes     Tommy Medal 03/02/2020, 3:09 PM

## 2020-03-02 NOTE — Discharge Summary (Signed)
Physician Discharge Summary  Judy Davis LXB:[id number] DOB: 1956-08-02 DOA: 02/27/2020  PCP: Sandria Manly [place] date: 12/30/3843 Discharge date: 03/02/2020  Admitted From: Home Disposition:  Home   Recommendations for Outpatient Follow-up:  1. Follow up with PCP in 1-2 weeks 2. Please obtain BMP/CBC in one week 3. Ceftriaxone 2 grams daily--last day on 03/10/20 4. Removed PICC line after last dose of IV ceftriaxone   Home Health: RN Equipment/Devices: PICC  Discharge Condition: Stable CODE STATUS: FULL Diet recommendation: Heart Healthy    Brief/Interim Summary: 64 y.o.femalewith history of hypertension and chronic alcoholism was sent to the ED by family members when her brother noted that the past several days she has had poor oral intake and has been complaining of abdominal pain. She is not eating or drinking well. She has been weak and frail and not ambulating. Lying in the bed most of the time.Her abdominal pain seems to be worsened with eating and that is why she has not been eating that much. She describes her pain as sharp radiating into the back with burning and constantly aching. She had an EGD by Dr. Oneida Alar in November 2020 with findings of grade C esophagitis and gastritis. H. pylori was negative.The patient was found to have sepsis secondary to bacteremia and UTI. The patient also was noted to have an acute on chronic pancreatitis exacerbation. GI was consulted to assist with management.   Discharge Diagnoses:   Sepsis -Present on admission -Secondary to bacteremia and UTI -Continue ceftriaxone -Lactic acid peaked at 5.5 -Continue IV fluids>>saline lock -UA21-50 WBC -Personally reviewed chest x-ray--no consolidations  Streptococcal bacteremia--strep anginosus -Source unclear -Repeat blood cultures neg to date -Echocardiogram-EF 60-65%, no vegetation, no WMA -Continue ceftriaxone 2 g daily -PICC placed 03/02/20 -plan  ceftriaxone IV with last day on 03/10/20  UTI -Urine culture growing E. 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   Complete by: As directed    [facility] to provide Cath Flo 31m   Complete by: As directed    Administer for PICC line occlusion and as ordered by physician for other access device issues.   Anaphylaxis Kit: Provided to treat any anaphylactic reaction to the medication being provided to the patient if First Dose or when requested by physician   Complete by: As directed    Epinephrine 151mml vial / amp: Administer 0.35m71m0.35ml39mubcutaneously once for moderate to severe anaphylaxis, nurse to call physician and pharmacy when reaction occurs and call 911 if needed for immediate care   Diphenhydramine 50mg72mIV vial: Administer 25-50mg 84mM PRN for first dose reaction, rash, itching, mild reaction, nurse to call physician and pharmacy when reaction occurs   Sodium Chloride 0.9% NS  500ml I11m  Physician Discharge Summary  Judy Davis LXB:[id number] DOB: 1956-08-02 DOA: 02/27/2020  PCP: Sandria Manly [place] date: 12/30/3843 Discharge date: 03/02/2020  Admitted From: Home Disposition:  Home   Recommendations for Outpatient Follow-up:  1. Follow up with PCP in 1-2 weeks 2. Please obtain BMP/CBC in one week 3. Ceftriaxone 2 grams daily--last day on 03/10/20 4. Removed PICC line after last dose of IV ceftriaxone   Home Health: RN Equipment/Devices: PICC  Discharge Condition: Stable CODE STATUS: FULL Diet recommendation: Heart Healthy    Brief/Interim Summary: 64 y.o.femalewith history of hypertension and chronic alcoholism was sent to the ED by family members when her brother noted that the past several days she has had poor oral intake and has been complaining of abdominal pain. She is not eating or drinking well. She has been weak and frail and not ambulating. Lying in the bed most of the time.Her abdominal pain seems to be worsened with eating and that is why she has not been eating that much. She describes her pain as sharp radiating into the back with burning and constantly aching. She had an EGD by Dr. Oneida Alar in November 2020 with findings of grade C esophagitis and gastritis. H. pylori was negative.The patient was found to have sepsis secondary to bacteremia and UTI. The patient also was noted to have an acute on chronic pancreatitis exacerbation. GI was consulted to assist with management.   Discharge Diagnoses:   Sepsis -Present on admission -Secondary to bacteremia and UTI -Continue ceftriaxone -Lactic acid peaked at 5.5 -Continue IV fluids>>saline lock -UA21-50 WBC -Personally reviewed chest x-ray--no consolidations  Streptococcal bacteremia--strep anginosus -Source unclear -Repeat blood cultures neg to date -Echocardiogram-EF 60-65%, no vegetation, no WMA -Continue ceftriaxone 2 g daily -PICC placed 03/02/20 -plan  ceftriaxone IV with last day on 03/10/20  UTI -Urine culture growing E. 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   Complete by: As directed    [facility] to provide Cath Flo 31m   Complete by: As directed    Administer for PICC line occlusion and as ordered by physician for other access device issues.   Anaphylaxis Kit: Provided to treat any anaphylactic reaction to the medication being provided to the patient if First Dose or when requested by physician   Complete by: As directed    Epinephrine 151mml vial / amp: Administer 0.35m71m0.35ml39mubcutaneously once for moderate to severe anaphylaxis, nurse to call physician and pharmacy when reaction occurs and call 911 if needed for immediate care   Diphenhydramine 50mg72mIV vial: Administer 25-50mg 84mM PRN for first dose reaction, rash, itching, mild reaction, nurse to call physician and pharmacy when reaction occurs   Sodium Chloride 0.9% NS  500ml I11m  Result Date: 03/01/2020    ECHOCARDIOGRAM REPORT   Patient Name:   Judy Davis Date of Exam: 03/01/2020 Medical Rec #:  [id number]         Height:       66.0 in Accession #:    [id number]         Weight:       108.0 lb Date of Birth:  [birth year]          BSA:          1.539 m Patient Age:    89 years          BP:           145/90 mmHg Patient Gender: F                 HR:           77 bpm. Exam Location:  [facility] Procedure: 2D Echo Indications:    Bacteremia 790.7 / R78.81  History:        Patient has no prior history of Echocardiogram examinations.                 Signs/Symptoms:Bacteremia; Risk Factors:Hypertension and                 Non-Smoker. ETOH, Lactic acidosis, Bacteremia due to                 Gram-positive bacteria.  Sonographer:    Leavy Cella RDCS (AE) Referring Phys: [phone] Rozalia Dino IMPRESSIONS  1. Left ventricular ejection fraction, by estimation, is 60 to 65%. The left ventricle has normal function. The left ventricle has no regional wall motion abnormalities. Left ventricular diastolic parameters were normal.  2. Right ventricular systolic function is normal. The right ventricular size is normal.  3. The mitral valve is normal in structure. No evidence of mitral valve regurgitation. No evidence of mitral stenosis.  4. The aortic valve is normal in structure. Aortic valve regurgitation is trivial. No aortic stenosis is present.  5. The inferior vena cava is normal in size with greater than 50% respiratory variability, suggesting right atrial pressure of 3 mmHg. FINDINGS  Left Ventricle: Left ventricular ejection fraction, by estimation, is 60 to 65%. The left ventricle has normal function. The left ventricle has no regional wall motion abnormalities. The left ventricular internal cavity size was normal in size. There is  no left ventricular hypertrophy. Left ventricular diastolic parameters were normal. Right Ventricle: The right ventricular size is normal. No increase in right ventricular wall thickness. Right ventricular systolic function is normal. Left Atrium: Left atrial size was normal in size. Right Atrium: Right atrial size was normal in size. Pericardium: There is no  evidence of pericardial effusion. Mitral Valve: The mitral valve is normal in structure. Normal mobility of the mitral valve leaflets. No evidence of mitral valve regurgitation. No evidence of mitral valve stenosis. Tricuspid Valve: The tricuspid valve is normal in structure. Tricuspid valve regurgitation is trivial. No evidence of tricuspid stenosis. Aortic Valve: The aortic valve is normal in structure. Aortic valve regurgitation is trivial. No aortic stenosis is present. Pulmonic Valve: The pulmonic valve was not well visualized. Pulmonic valve regurgitation is trivial. No evidence of pulmonic stenosis. Aorta: The aortic root is normal in size and structure. Venous: The inferior vena cava is normal in size with greater than 50% respiratory variability, suggesting right atrial pressure of 3 mmHg. IAS/Shunts: No atrial level shunt detected by color flow Doppler.  LEFT  Result Date: 03/01/2020    ECHOCARDIOGRAM REPORT   Patient Name:   Judy Davis Date of Exam: 03/01/2020 Medical Rec #:  [id number]         Height:       66.0 in Accession #:    [id number]         Weight:       108.0 lb Date of Birth:  [birth year]          BSA:          1.539 m Patient Age:    89 years          BP:           145/90 mmHg Patient Gender: F                 HR:           77 bpm. Exam Location:  [facility] Procedure: 2D Echo Indications:    Bacteremia 790.7 / R78.81  History:        Patient has no prior history of Echocardiogram examinations.                 Signs/Symptoms:Bacteremia; Risk Factors:Hypertension and                 Non-Smoker. ETOH, Lactic acidosis, Bacteremia due to                 Gram-positive bacteria.  Sonographer:    Leavy Cella RDCS (AE) Referring Phys: [phone] Rozalia Dino IMPRESSIONS  1. Left ventricular ejection fraction, by estimation, is 60 to 65%. The left ventricle has normal function. The left ventricle has no regional wall motion abnormalities. Left ventricular diastolic parameters were normal.  2. Right ventricular systolic function is normal. The right ventricular size is normal.  3. The mitral valve is normal in structure. No evidence of mitral valve regurgitation. No evidence of mitral stenosis.  4. The aortic valve is normal in structure. Aortic valve regurgitation is trivial. No aortic stenosis is present.  5. The inferior vena cava is normal in size with greater than 50% respiratory variability, suggesting right atrial pressure of 3 mmHg. FINDINGS  Left Ventricle: Left ventricular ejection fraction, by estimation, is 60 to 65%. The left ventricle has normal function. The left ventricle has no regional wall motion abnormalities. The left ventricular internal cavity size was normal in size. There is  no left ventricular hypertrophy. Left ventricular diastolic parameters were normal. Right Ventricle: The right ventricular size is normal. No increase in right ventricular wall thickness. Right ventricular systolic function is normal. Left Atrium: Left atrial size was normal in size. Right Atrium: Right atrial size was normal in size. Pericardium: There is no  evidence of pericardial effusion. Mitral Valve: The mitral valve is normal in structure. Normal mobility of the mitral valve leaflets. No evidence of mitral valve regurgitation. No evidence of mitral valve stenosis. Tricuspid Valve: The tricuspid valve is normal in structure. Tricuspid valve regurgitation is trivial. No evidence of tricuspid stenosis. Aortic Valve: The aortic valve is normal in structure. Aortic valve regurgitation is trivial. No aortic stenosis is present. Pulmonic Valve: The pulmonic valve was not well visualized. Pulmonic valve regurgitation is trivial. No evidence of pulmonic stenosis. Aorta: The aortic root is normal in size and structure. Venous: The inferior vena cava is normal in size with greater than 50% respiratory variability, suggesting right atrial pressure of 3 mmHg. IAS/Shunts: No atrial level shunt detected by color flow Doppler.  LEFT  Portions of today's examination is considerably limited by extensive patient respiratory motion. Lower chest: Trace bilateral pleural effusions lying dependently. Hepatobiliary: Diffuse loss of signal intensity throughout the hepatic parenchyma on out of phase dual echo images indicative of hepatic steatosis. In the inferior aspect of segment 6 of the liver (axial image 29 of series 21 and coronal image 13 of series 4) there is a well-defined 3.4 x 3.2 x 4.2 cm T1 hypointense, T2 hyperintense, nonenhancing lesion, compatible with a  simple cyst. No other suspicious hepatic lesions. No intra or extrahepatic biliary ductal dilatation. Gallbladder is normal in appearance. Pancreas: In the head and proximal body of the pancreas (axial image 36 of series 18 and coronal image 55 of series 20) there is a large lesion that is heterogeneous in T1 and T2 signal intensity, with predominantly T1 hypointensity the non dependently and T1 isointensity dependently with T2 hyperintensity non dependently and T2 hypointensity dependently, but no evidence of internal enhancement, favored to represent a mildly complex pancreatic cyst. Spleen:  Unremarkable. Adrenals/Urinary Tract: Bilateral kidneys and bilateral adrenal glands are normal in appearance. No hydroureteronephrosis in the visualized portions of the abdomen. Stomach/Bowel: Visualized portions are unremarkable. Vascular/Lymphatic: No aneurysm identified in the visualized abdominal vasculature. No lymphadenopathy noted in the abdomen. Other: Small amount of retroperitoneal fluid most evident adjacent to the pancreas. No significant volume of ascites noted in the visualized portions of the peritoneal cavity. Musculoskeletal: No aggressive appearing osseous lesions are noted in the visualized portions of the skeleton. IMPRESSION: 1. 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 This is exerting mass effect locally causing pancreatic ductal dilatation. There are surrounding inflammatory changes adjacent to the pancreas indicative of residual acute pancreatitis. 2. Hepatic steatosis. 3. Trace bilateral pleural effusions lying dependently. Electronically Signed   By: Vinnie Langton M.D.   On: 02/28/2020 14:20   ECHOCARDIOGRAM COMPLETE  Result Date: 03/01/2020    ECHOCARDIOGRAM REPORT   Patient Name:   Judy Davis Date of Exam: 03/01/2020 Medical Rec #:  [id number]         Height:       66.0 in  Accession #:    [id number]        Weight:       108.0 lb Date of Birth:  Jan 19, 1956          BSA:          1.539 m Patient Age:    63 years          BP:           145/90 mmHg Patient Gender: F                 HR:           77 bpm. Exam Location:  [facility] Procedure: 2D Echo Indications:    Bacteremia 790.7 / R78.81  History:        Patient has no prior history of Echocardiogram examinations.                 Signs/Symptoms:Bacteremia; Risk Factors:Hypertension and                 Non-Smoker. ETOH, Lactic acidosis, Bacteremia due to                 Gram-positive bacteria.  Sonographer:    Leavy Cella RDCS (AE) Referring Phys: [phone] Kaylina Cahue IMPRESSIONS  Portions of today's examination is considerably limited by extensive patient respiratory motion. Lower chest: Trace bilateral pleural effusions lying dependently. Hepatobiliary: Diffuse loss of signal intensity throughout the hepatic parenchyma on out of phase dual echo images indicative of hepatic steatosis. In the inferior aspect of segment 6 of the liver (axial image 29 of series 21 and coronal image 13 of series 4) there is a well-defined 3.4 x 3.2 x 4.2 cm T1 hypointense, T2 hyperintense, nonenhancing lesion, compatible with a  simple cyst. No other suspicious hepatic lesions. No intra or extrahepatic biliary ductal dilatation. Gallbladder is normal in appearance. Pancreas: In the head and proximal body of the pancreas (axial image 36 of series 18 and coronal image 55 of series 20) there is a large lesion that is heterogeneous in T1 and T2 signal intensity, with predominantly T1 hypointensity the non dependently and T1 isointensity dependently with T2 hyperintensity non dependently and T2 hypointensity dependently, but no evidence of internal enhancement, favored to represent a mildly complex pancreatic cyst. Spleen:  Unremarkable. Adrenals/Urinary Tract: Bilateral kidneys and bilateral adrenal glands are normal in appearance. No hydroureteronephrosis in the visualized portions of the abdomen. Stomach/Bowel: Visualized portions are unremarkable. Vascular/Lymphatic: No aneurysm identified in the visualized abdominal vasculature. No lymphadenopathy noted in the abdomen. Other: Small amount of retroperitoneal fluid most evident adjacent to the pancreas. No significant volume of ascites noted in the visualized portions of the peritoneal cavity. Musculoskeletal: No aggressive appearing osseous lesions are noted in the visualized portions of the skeleton. IMPRESSION: 1. 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 This is exerting mass effect locally causing pancreatic ductal dilatation. There are surrounding inflammatory changes adjacent to the pancreas indicative of residual acute pancreatitis. 2. Hepatic steatosis. 3. Trace bilateral pleural effusions lying dependently. Electronically Signed   By: Vinnie Langton M.D.   On: 02/28/2020 14:20   ECHOCARDIOGRAM COMPLETE  Result Date: 03/01/2020    ECHOCARDIOGRAM REPORT   Patient Name:   Judy Davis Date of Exam: 03/01/2020 Medical Rec #:  [id number]         Height:       66.0 in  Accession #:    [id number]        Weight:       108.0 lb Date of Birth:  Jan 19, 1956          BSA:          1.539 m Patient Age:    63 years          BP:           145/90 mmHg Patient Gender: F                 HR:           77 bpm. Exam Location:  [facility] Procedure: 2D Echo Indications:    Bacteremia 790.7 / R78.81  History:        Patient has no prior history of Echocardiogram examinations.                 Signs/Symptoms:Bacteremia; Risk Factors:Hypertension and                 Non-Smoker. ETOH, Lactic acidosis, Bacteremia due to                 Gram-positive bacteria.  Sonographer:    Leavy Cella RDCS (AE) Referring Phys: [phone] Kaylina Cahue IMPRESSIONS  Result Date: 03/01/2020    ECHOCARDIOGRAM REPORT   Patient Name:   Judy Davis Date of Exam: 03/01/2020 Medical Rec #:  [id number]         Height:       66.0 in Accession #:    [id number]         Weight:       108.0 lb Date of Birth:  [birth year]          BSA:          1.539 m Patient Age:    89 years          BP:           145/90 mmHg Patient Gender: F                 HR:           77 bpm. Exam Location:  [facility] Procedure: 2D Echo Indications:    Bacteremia 790.7 / R78.81  History:        Patient has no prior history of Echocardiogram examinations.                 Signs/Symptoms:Bacteremia; Risk Factors:Hypertension and                 Non-Smoker. ETOH, Lactic acidosis, Bacteremia due to                 Gram-positive bacteria.  Sonographer:    Leavy Cella RDCS (AE) Referring Phys: [phone] Rozalia Dino IMPRESSIONS  1. Left ventricular ejection fraction, by estimation, is 60 to 65%. The left ventricle has normal function. The left ventricle has no regional wall motion abnormalities. Left ventricular diastolic parameters were normal.  2. Right ventricular systolic function is normal. The right ventricular size is normal.  3. The mitral valve is normal in structure. No evidence of mitral valve regurgitation. No evidence of mitral stenosis.  4. The aortic valve is normal in structure. Aortic valve regurgitation is trivial. No aortic stenosis is present.  5. The inferior vena cava is normal in size with greater than 50% respiratory variability, suggesting right atrial pressure of 3 mmHg. FINDINGS  Left Ventricle: Left ventricular ejection fraction, by estimation, is 60 to 65%. The left ventricle has normal function. The left ventricle has no regional wall motion abnormalities. The left ventricular internal cavity size was normal in size. There is  no left ventricular hypertrophy. Left ventricular diastolic parameters were normal. Right Ventricle: The right ventricular size is normal. No increase in right ventricular wall thickness. Right ventricular systolic function is normal. Left Atrium: Left atrial size was normal in size. Right Atrium: Right atrial size was normal in size. Pericardium: There is no  evidence of pericardial effusion. Mitral Valve: The mitral valve is normal in structure. Normal mobility of the mitral valve leaflets. No evidence of mitral valve regurgitation. No evidence of mitral valve stenosis. Tricuspid Valve: The tricuspid valve is normal in structure. Tricuspid valve regurgitation is trivial. No evidence of tricuspid stenosis. Aortic Valve: The aortic valve is normal in structure. Aortic valve regurgitation is trivial. No aortic stenosis is present. Pulmonic Valve: The pulmonic valve was not well visualized. Pulmonic valve regurgitation is trivial. No evidence of pulmonic stenosis. Aorta: The aortic root is normal in size and structure. Venous: The inferior vena cava is normal in size with greater than 50% respiratory variability, suggesting right atrial pressure of 3 mmHg. IAS/Shunts: No atrial level shunt detected by color flow Doppler.  LEFT  Result Date: 03/01/2020    ECHOCARDIOGRAM REPORT   Patient Name:   Judy Davis Date of Exam: 03/01/2020 Medical Rec #:  [id number]         Height:       66.0 in Accession #:    [id number]         Weight:       108.0 lb Date of Birth:  [birth year]          BSA:          1.539 m Patient Age:    89 years          BP:           145/90 mmHg Patient Gender: F                 HR:           77 bpm. Exam Location:  [facility] Procedure: 2D Echo Indications:    Bacteremia 790.7 / R78.81  History:        Patient has no prior history of Echocardiogram examinations.                 Signs/Symptoms:Bacteremia; Risk Factors:Hypertension and                 Non-Smoker. ETOH, Lactic acidosis, Bacteremia due to                 Gram-positive bacteria.  Sonographer:    Leavy Cella RDCS (AE) Referring Phys: [phone] Rozalia Dino IMPRESSIONS  1. Left ventricular ejection fraction, by estimation, is 60 to 65%. The left ventricle has normal function. The left ventricle has no regional wall motion abnormalities. Left ventricular diastolic parameters were normal.  2. Right ventricular systolic function is normal. The right ventricular size is normal.  3. The mitral valve is normal in structure. No evidence of mitral valve regurgitation. No evidence of mitral stenosis.  4. The aortic valve is normal in structure. Aortic valve regurgitation is trivial. No aortic stenosis is present.  5. The inferior vena cava is normal in size with greater than 50% respiratory variability, suggesting right atrial pressure of 3 mmHg. FINDINGS  Left Ventricle: Left ventricular ejection fraction, by estimation, is 60 to 65%. The left ventricle has normal function. The left ventricle has no regional wall motion abnormalities. The left ventricular internal cavity size was normal in size. There is  no left ventricular hypertrophy. Left ventricular diastolic parameters were normal. Right Ventricle: The right ventricular size is normal. No increase in right ventricular wall thickness. Right ventricular systolic function is normal. Left Atrium: Left atrial size was normal in size. Right Atrium: Right atrial size was normal in size. Pericardium: There is no  evidence of pericardial effusion. Mitral Valve: The mitral valve is normal in structure. Normal mobility of the mitral valve leaflets. No evidence of mitral valve regurgitation. No evidence of mitral valve stenosis. Tricuspid Valve: The tricuspid valve is normal in structure. Tricuspid valve regurgitation is trivial. No evidence of tricuspid stenosis. Aortic Valve: The aortic valve is normal in structure. Aortic valve regurgitation is trivial. No aortic stenosis is present. Pulmonic Valve: The pulmonic valve was not well visualized. Pulmonic valve regurgitation is trivial. No evidence of pulmonic stenosis. Aorta: The aortic root is normal in size and structure. Venous: The inferior vena cava is normal in size with greater than 50% respiratory variability, suggesting right atrial pressure of 3 mmHg. IAS/Shunts: No atrial level shunt detected by color flow Doppler.  LEFT  Result Date: 03/01/2020    ECHOCARDIOGRAM REPORT   Patient Name:   Judy Davis Date of Exam: 03/01/2020 Medical Rec #:  [id number]         Height:       66.0 in Accession #:    [id number]         Weight:       108.0 lb Date of Birth:  [birth year]          BSA:          1.539 m Patient Age:    89 years          BP:           145/90 mmHg Patient Gender: F                 HR:           77 bpm. Exam Location:  [facility] Procedure: 2D Echo Indications:    Bacteremia 790.7 / R78.81  History:        Patient has no prior history of Echocardiogram examinations.                 Signs/Symptoms:Bacteremia; Risk Factors:Hypertension and                 Non-Smoker. ETOH, Lactic acidosis, Bacteremia due to                 Gram-positive bacteria.  Sonographer:    Leavy Cella RDCS (AE) Referring Phys: [phone] Rozalia Dino IMPRESSIONS  1. Left ventricular ejection fraction, by estimation, is 60 to 65%. The left ventricle has normal function. The left ventricle has no regional wall motion abnormalities. Left ventricular diastolic parameters were normal.  2. Right ventricular systolic function is normal. The right ventricular size is normal.  3. The mitral valve is normal in structure. No evidence of mitral valve regurgitation. No evidence of mitral stenosis.  4. The aortic valve is normal in structure. Aortic valve regurgitation is trivial. No aortic stenosis is present.  5. The inferior vena cava is normal in size with greater than 50% respiratory variability, suggesting right atrial pressure of 3 mmHg. FINDINGS  Left Ventricle: Left ventricular ejection fraction, by estimation, is 60 to 65%. The left ventricle has normal function. The left ventricle has no regional wall motion abnormalities. The left ventricular internal cavity size was normal in size. There is  no left ventricular hypertrophy. Left ventricular diastolic parameters were normal. Right Ventricle: The right ventricular size is normal. No increase in right ventricular wall thickness. Right ventricular systolic function is normal. Left Atrium: Left atrial size was normal in size. Right Atrium: Right atrial size was normal in size. Pericardium: There is no  evidence of pericardial effusion. Mitral Valve: The mitral valve is normal in structure. Normal mobility of the mitral valve leaflets. No evidence of mitral valve regurgitation. No evidence of mitral valve stenosis. Tricuspid Valve: The tricuspid valve is normal in structure. Tricuspid valve regurgitation is trivial. No evidence of tricuspid stenosis. Aortic Valve: The aortic valve is normal in structure. Aortic valve regurgitation is trivial. No aortic stenosis is present. Pulmonic Valve: The pulmonic valve was not well visualized. Pulmonic valve regurgitation is trivial. No evidence of pulmonic stenosis. Aorta: The aortic root is normal in size and structure. Venous: The inferior vena cava is normal in size with greater than 50% respiratory variability, suggesting right atrial pressure of 3 mmHg. IAS/Shunts: No atrial level shunt detected by color flow Doppler.  LEFT  Portions of today's examination is considerably limited by extensive patient respiratory motion. Lower chest: Trace bilateral pleural effusions lying dependently. Hepatobiliary: Diffuse loss of signal intensity throughout the hepatic parenchyma on out of phase dual echo images indicative of hepatic steatosis. In the inferior aspect of segment 6 of the liver (axial image 29 of series 21 and coronal image 13 of series 4) there is a well-defined 3.4 x 3.2 x 4.2 cm T1 hypointense, T2 hyperintense, nonenhancing lesion, compatible with a  simple cyst. No other suspicious hepatic lesions. No intra or extrahepatic biliary ductal dilatation. Gallbladder is normal in appearance. Pancreas: In the head and proximal body of the pancreas (axial image 36 of series 18 and coronal image 55 of series 20) there is a large lesion that is heterogeneous in T1 and T2 signal intensity, with predominantly T1 hypointensity the non dependently and T1 isointensity dependently with T2 hyperintensity non dependently and T2 hypointensity dependently, but no evidence of internal enhancement, favored to represent a mildly complex pancreatic cyst. Spleen:  Unremarkable. Adrenals/Urinary Tract: Bilateral kidneys and bilateral adrenal glands are normal in appearance. No hydroureteronephrosis in the visualized portions of the abdomen. Stomach/Bowel: Visualized portions are unremarkable. Vascular/Lymphatic: No aneurysm identified in the visualized abdominal vasculature. No lymphadenopathy noted in the abdomen. Other: Small amount of retroperitoneal fluid most evident adjacent to the pancreas. No significant volume of ascites noted in the visualized portions of the peritoneal cavity. Musculoskeletal: No aggressive appearing osseous lesions are noted in the visualized portions of the skeleton. IMPRESSION: 1. 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 This is exerting mass effect locally causing pancreatic ductal dilatation. There are surrounding inflammatory changes adjacent to the pancreas indicative of residual acute pancreatitis. 2. Hepatic steatosis. 3. Trace bilateral pleural effusions lying dependently. Electronically Signed   By: Vinnie Langton M.D.   On: 02/28/2020 14:20   ECHOCARDIOGRAM COMPLETE  Result Date: 03/01/2020    ECHOCARDIOGRAM REPORT   Patient Name:   Judy Davis Date of Exam: 03/01/2020 Medical Rec #:  [id number]         Height:       66.0 in  Accession #:    [id number]        Weight:       108.0 lb Date of Birth:  Jan 19, 1956          BSA:          1.539 m Patient Age:    63 years          BP:           145/90 mmHg Patient Gender: F                 HR:           77 bpm. Exam Location:  [facility] Procedure: 2D Echo Indications:    Bacteremia 790.7 / R78.81  History:        Patient has no prior history of Echocardiogram examinations.                 Signs/Symptoms:Bacteremia; Risk Factors:Hypertension and                 Non-Smoker. ETOH, Lactic acidosis, Bacteremia due to                 Gram-positive bacteria.  Sonographer:    Leavy Cella RDCS (AE) Referring Phys: [phone] Kaylina Cahue IMPRESSIONS  Physician Discharge Summary  Judy Davis LXB:[id number] DOB: 1956-08-02 DOA: 02/27/2020  PCP: Sandria [facility] [place] date: 12/30/3843 Discharge date: 03/02/2020  Admitted From: Home Disposition:  Home   Recommendations for Outpatient Follow-up:  1. Follow up with PCP in 1-2 weeks 2. Please obtain BMP/CBC in one week 3. Ceftriaxone 2 grams daily--last day on 03/10/20 4. Removed PICC line after last dose of IV ceftriaxone   Home Health: RN Equipment/Devices: PICC  Discharge Condition: Stable CODE STATUS: FULL Diet recommendation: Heart Healthy    Brief/Interim Summary: 64 y.o.femalewith history of hypertension and chronic alcoholism was sent to the ED by family members when her brother noted that the past several days she has had poor oral intake and has been complaining of abdominal pain. She is not eating or drinking well. She has been weak and frail and not ambulating. Lying in the bed most of the time.Her abdominal pain seems to be worsened with eating and that is why she has not been eating that much. She describes her pain as sharp radiating into the back with burning and constantly aching. She had an EGD by Dr. Oneida Alar in November 2020 with findings of grade C esophagitis and gastritis. H. pylori was negative.The patient was found to have sepsis secondary to bacteremia and UTI. The patient also was noted to have an acute on chronic pancreatitis exacerbation. GI was consulted to assist with management.   Discharge Diagnoses:   Sepsis -Present on admission -Secondary to bacteremia and UTI -Continue ceftriaxone -Lactic acid peaked at 5.5 -Continue IV fluids>>saline lock -UA21-50 WBC -Personally reviewed chest x-ray--no consolidations  Streptococcal bacteremia--strep anginosus -Source unclear -Repeat blood cultures neg to date -Echocardiogram-EF 60-65%, no vegetation, no WMA -Continue ceftriaxone 2 g daily -PICC placed 03/02/20 -plan  ceftriaxone IV with last day on 03/10/20  UTI -Urine culture growing E. 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   Complete by: As directed    [facility] to provide Cath Flo 31m   Complete by: As directed    Administer for PICC line occlusion and as ordered by physician for other access device issues.   Anaphylaxis Kit: Provided to treat any anaphylactic reaction to the medication being provided to the patient if First Dose or when requested by physician   Complete by: As directed    Epinephrine 151mml vial / amp: Administer 0.35m71m0.35ml39mubcutaneously once for moderate to severe anaphylaxis, nurse to call physician and pharmacy when reaction occurs and call 911 if needed for immediate care   Diphenhydramine 50mg72mIV vial: Administer 25-50mg 84mM PRN for first dose reaction, rash, itching, mild reaction, nurse to call physician and pharmacy when reaction occurs   Sodium Chloride 0.9% NS  500ml I11m  Physician Discharge Summary  Judy Davis LXB:[id number] DOB: 1956-08-02 DOA: 02/27/2020  PCP: Sandria Man[facility] [place] date: 12/30/3843 Discharge date: 03/02/2020  Admitted From: Home Disposition:  Home   Recommendations for Outpatient Follow-up:  1. Follow up with PCP in 1-2 weeks 2. Please obtain BMP/CBC in one week 3. Ceftriaxone 2 grams daily--last day on 03/10/20 4. Removed PICC line after last dose of IV ceftriaxone   Home Health: RN Equipment/Devices: PICC  Discharge Condition: Stable CODE STATUS: FULL Diet recommendation: Heart Healthy    Brief/Interim Summary: 64 y.o.femalewith history of hypertension and chronic alcoholism was sent to the ED by family members when her brother noted that the past several days she has had poor oral intake and has been complaining of abdominal pain. She is not eating or drinking well. She has been weak and frail and not ambulating. Lying in the bed most of the time.Her abdominal pain seems to be worsened with eating and that is why she has not been eating that much. She describes her pain as sharp radiating into the back with burning and constantly aching. She had an EGD by Dr. Oneida Alar in November 2020 with findings of grade C esophagitis and gastritis. H. pylori was negative.The patient was found to have sepsis secondary to bacteremia and UTI. The patient also was noted to have an acute on chronic pancreatitis exacerbation. GI was consulted to assist with management.   Discharge Diagnoses:   Sepsis -Present on admission -Secondary to bacteremia and UTI -Continue ceftriaxone -Lactic acid peaked at 5.5 -Continue IV fluids>>saline lock -UA21-50 WBC -Personally reviewed chest x-ray--no consolidations  Streptococcal bacteremia--strep anginosus -Source unclear -Repeat blood cultures neg to date -Echocardiogram-EF 60-65%, no vegetation, no WMA -Continue ceftriaxone 2 g daily -PICC placed 03/02/20 -plan  ceftriaxone IV with last day on 03/10/20  UTI -Urine culture growing E. 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   Complete by: As directed    [facility] to provide Cath Flo 31m   Complete by: As directed    Administer for PICC line occlusion and as ordered by physician for other access device issues.   Anaphylaxis Kit: Provided to treat any anaphylactic reaction to the medication being provided to the patient if First Dose or when requested by physician   Complete by: As directed    Epinephrine 151mml vial / amp: Administer 0.35m71m0.35ml39mubcutaneously once for moderate to severe anaphylaxis, nurse to call physician and pharmacy when reaction occurs and call 911 if needed for immediate care   Diphenhydramine 50mg72mIV vial: Administer 25-50mg 84mM PRN for first dose reaction, rash, itching, mild reaction, nurse to call physician and pharmacy when reaction occurs   Sodium Chloride 0.9% NS  500ml I11m  Physician Discharge Summary  Judy Davis LXB:[id number] DOB: 1956-08-02 DOA: 02/27/2020  PCP: Sandria Man[facility] [place] date: 12/30/3843 Discharge date: 03/02/2020  Admitted From: Home Disposition:  Home   Recommendations for Outpatient Follow-up:  1. Follow up with PCP in 1-2 weeks 2. Please obtain BMP/CBC in one week 3. Ceftriaxone 2 grams daily--last day on 03/10/20 4. Removed PICC line after last dose of IV ceftriaxone   Home Health: RN Equipment/Devices: PICC  Discharge Condition: Stable CODE STATUS: FULL Diet recommendation: Heart Healthy    Brief/Interim Summary: 64 y.o.femalewith history of hypertension and chronic alcoholism was sent to the ED by family members when her brother noted that the past several days she has had poor oral intake and has been complaining of abdominal pain. She is not eating or drinking well. She has been weak and frail and not ambulating. Lying in the bed most of the time.Her abdominal pain seems to be worsened with eating and that is why she has not been eating that much. She describes her pain as sharp radiating into the back with burning and constantly aching. She had an EGD by Dr. Oneida Alar in November 2020 with findings of grade C esophagitis and gastritis. H. pylori was negative.The patient was found to have sepsis secondary to bacteremia and UTI. The patient also was noted to have an acute on chronic pancreatitis exacerbation. GI was consulted to assist with management.   Discharge Diagnoses:   Sepsis -Present on admission -Secondary to bacteremia and UTI -Continue ceftriaxone -Lactic acid peaked at 5.5 -Continue IV fluids>>saline lock -UA21-50 WBC -Personally reviewed chest x-ray--no consolidations  Streptococcal bacteremia--strep anginosus -Source unclear -Repeat blood cultures neg to date -Echocardiogram-EF 60-65%, no vegetation, no WMA -Continue ceftriaxone 2 g daily -PICC placed 03/02/20 -plan  ceftriaxone IV with last day on 03/10/20  UTI -Urine culture growing E. 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   Complete by: As directed    [facility] to provide Cath Flo 31m   Complete by: As directed    Administer for PICC line occlusion and as ordered by physician for other access device issues.   Anaphylaxis Kit: Provided to treat any anaphylactic reaction to the medication being provided to the patient if First Dose or when requested by physician   Complete by: As directed    Epinephrine 151mml vial / amp: Administer 0.35m71m0.35ml39mubcutaneously once for moderate to severe anaphylaxis, nurse to call physician and pharmacy when reaction occurs and call 911 if needed for immediate care   Diphenhydramine 50mg72mIV vial: Administer 25-50mg 84mM PRN for first dose reaction, rash, itching, mild reaction, nurse to call physician and pharmacy when reaction occurs   Sodium Chloride 0.9% NS  500ml I11m  Result Date: 03/01/2020    ECHOCARDIOGRAM REPORT   Patient Name:   Judy Davis Date of Exam: 03/01/2020 Medical Rec #:  [id number]         Height:       66.0 in Accession #:    [id number]         Weight:       108.0 lb Date of Birth:  [birth year]          BSA:          1.539 m Patient Age:    89 years          BP:           145/90 mmHg Patient Gender: F                 HR:           77 bpm. Exam Location:  [facility] Procedure: 2D Echo Indications:    Bacteremia 790.7 / R78.81  History:        Patient has no prior history of Echocardiogram examinations.                 Signs/Symptoms:Bacteremia; Risk Factors:Hypertension and                 Non-Smoker. ETOH, Lactic acidosis, Bacteremia due to                 Gram-positive bacteria.  Sonographer:    Leavy Cella RDCS (AE) Referring Phys: [phone] Rozalia Dino IMPRESSIONS  1. Left ventricular ejection fraction, by estimation, is 60 to 65%. The left ventricle has normal function. The left ventricle has no regional wall motion abnormalities. Left ventricular diastolic parameters were normal.  2. Right ventricular systolic function is normal. The right ventricular size is normal.  3. The mitral valve is normal in structure. No evidence of mitral valve regurgitation. No evidence of mitral stenosis.  4. The aortic valve is normal in structure. Aortic valve regurgitation is trivial. No aortic stenosis is present.  5. The inferior vena cava is normal in size with greater than 50% respiratory variability, suggesting right atrial pressure of 3 mmHg. FINDINGS  Left Ventricle: Left ventricular ejection fraction, by estimation, is 60 to 65%. The left ventricle has normal function. The left ventricle has no regional wall motion abnormalities. The left ventricular internal cavity size was normal in size. There is  no left ventricular hypertrophy. Left ventricular diastolic parameters were normal. Right Ventricle: The right ventricular size is normal. No increase in right ventricular wall thickness. Right ventricular systolic function is normal. Left Atrium: Left atrial size was normal in size. Right Atrium: Right atrial size was normal in size. Pericardium: There is no  evidence of pericardial effusion. Mitral Valve: The mitral valve is normal in structure. Normal mobility of the mitral valve leaflets. No evidence of mitral valve regurgitation. No evidence of mitral valve stenosis. Tricuspid Valve: The tricuspid valve is normal in structure. Tricuspid valve regurgitation is trivial. No evidence of tricuspid stenosis. Aortic Valve: The aortic valve is normal in structure. Aortic valve regurgitation is trivial. No aortic stenosis is present. Pulmonic Valve: The pulmonic valve was not well visualized. Pulmonic valve regurgitation is trivial. No evidence of pulmonic stenosis. Aorta: The aortic root is normal in size and structure. Venous: The inferior vena cava is normal in size with greater than 50% respiratory variability, suggesting right atrial pressure of 3 mmHg. IAS/Shunts: No atrial level shunt detected by color flow Doppler.  LEFT  Portions of today's examination is considerably limited by extensive patient respiratory motion. Lower chest: Trace bilateral pleural effusions lying dependently. Hepatobiliary: Diffuse loss of signal intensity throughout the hepatic parenchyma on out of phase dual echo images indicative of hepatic steatosis. In the inferior aspect of segment 6 of the liver (axial image 29 of series 21 and coronal image 13 of series 4) there is a well-defined 3.4 x 3.2 x 4.2 cm T1 hypointense, T2 hyperintense, nonenhancing lesion, compatible with a  simple cyst. No other suspicious hepatic lesions. No intra or extrahepatic biliary ductal dilatation. Gallbladder is normal in appearance. Pancreas: In the head and proximal body of the pancreas (axial image 36 of series 18 and coronal image 55 of series 20) there is a large lesion that is heterogeneous in T1 and T2 signal intensity, with predominantly T1 hypointensity the non dependently and T1 isointensity dependently with T2 hyperintensity non dependently and T2 hypointensity dependently, but no evidence of internal enhancement, favored to represent a mildly complex pancreatic cyst. Spleen:  Unremarkable. Adrenals/Urinary Tract: Bilateral kidneys and bilateral adrenal glands are normal in appearance. No hydroureteronephrosis in the visualized portions of the abdomen. Stomach/Bowel: Visualized portions are unremarkable. Vascular/Lymphatic: No aneurysm identified in the visualized abdominal vasculature. No lymphadenopathy noted in the abdomen. Other: Small amount of retroperitoneal fluid most evident adjacent to the pancreas. No significant volume of ascites noted in the visualized portions of the peritoneal cavity. Musculoskeletal: No aggressive appearing osseous lesions are noted in the visualized portions of the skeleton. IMPRESSION: 1. 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 This is exerting mass effect locally causing pancreatic ductal dilatation. There are surrounding inflammatory changes adjacent to the pancreas indicative of residual acute pancreatitis. 2. Hepatic steatosis. 3. Trace bilateral pleural effusions lying dependently. Electronically Signed   By: Vinnie Langton M.D.   On: 02/28/2020 14:20   ECHOCARDIOGRAM COMPLETE  Result Date: 03/01/2020    ECHOCARDIOGRAM REPORT   Patient Name:   Judy Davis Date of Exam: 03/01/2020 Medical Rec #:  [id number]         Height:       66.0 in  Accession #:    [id number]        Weight:       108.0 lb Date of Birth:  Jan 19, 1956          BSA:          1.539 m Patient Age:    63 years          BP:           145/90 mmHg Patient Gender: F                 HR:           77 bpm. Exam Location:  [facility] Procedure: 2D Echo Indications:    Bacteremia 790.7 / R78.81  History:        Patient has no prior history of Echocardiogram examinations.                 Signs/Symptoms:Bacteremia; Risk Factors:Hypertension and                 Non-Smoker. ETOH, Lactic acidosis, Bacteremia due to                 Gram-positive bacteria.  Sonographer:    Leavy Cella RDCS (AE) Referring Phys: [phone] Kaylina Cahue IMPRESSIONS

## 2020-03-02 NOTE — Progress Notes (Addendum)
Subjective: No abdominal pain, N/V. Tolerating diet. No overt GI bleeding. Eager to go home.   Objective: Vital signs in last 24 hours: Temp:  [98.4 F (36.9 C)-98.8 F (37.1 C)] 98.8 F (37.1 C) (06/18 0455) Pulse Rate:  [72-92] 92 (06/18 0455) Resp:  [18-20] 20 (06/18 0455) BP: (130-148)/(72-92) 130/89 (06/18 0455) SpO2:  [97 %-100 %] 97 % (06/18 0751) Last BM Date: 03/01/20 General:   Alert and oriented, pleasant Head:  Normocephalic and atraumatic. Abdomen:  Bowel sounds present, soft, non-tender, non-distended. No HSM or hernias noted. No rebound or guarding. No masses appreciated  Extremities:  Without  edema. Neurologic:  Alert and  oriented x4  Intake/Output from previous day: 06/17 0701 - 06/18 0700 In: 320 [P.O.:320] Out: 1425 [Urine:1425] Intake/Output this shift: No intake/output data recorded.  Lab Results: Recent Labs    02/29/20 0438 03/01/20 0457 03/02/20 0602  WBC 12.5* 8.1 6.0  HGB 8.8* 8.9* 8.8*  HCT 26.6* 26.9* 27.4*  PLT 312 330 329   BMET Recent Labs    02/29/20 0438 03/01/20 0457 03/02/20 0602  NA 127* 126* 127*  K 2.7* 3.8 3.7  CL 96* 96* 96*  CO2 25 23 25   GLUCOSE 97 104* 99  BUN <5* <5* <5*  CREATININE 0.52 0.48 0.49  CALCIUM 8.0* 8.2* 8.4*   LFT Recent Labs    02/29/20 0438 03/01/20 0457 03/02/20 0602  PROT 5.6* 5.9* 5.9*  ALBUMIN 2.2* 2.4* 2.4*  AST 19 18 22   ALT 13 11 11   ALKPHOS 61 74 65  BILITOT 0.2* 0.2* 0.3     Studies/Results: ECHOCARDIOGRAM COMPLETE  Result Date: 03/01/2020    ECHOCARDIOGRAM REPORT   Patient Name:   Judy Davis Date of Exam: 03/01/2020 Medical Rec #:  [id number]         Height:       66.0 in Accession #:    [id number]        Weight:       108.0 lb Date of Birth:  08/04/1956          BSA:          1.539 m Patient Age:    4 years          BP:           145/90 mmHg Patient Gender: F                 HR:           77 bpm. Exam Location:  [facility] Procedure: 2D Echo Indications:     Bacteremia 790.7 / R78.81  History:        Patient has no prior history of Echocardiogram examinations.                 Signs/Symptoms:Bacteremia; Risk Factors:Hypertension and                 Non-Smoker. ETOH, Lactic acidosis, Bacteremia due to                 Gram-positive bacteria.  Sonographer:    Leavy Cella RDCS (AE) Referring Phys: [phone] DAVID TAT IMPRESSIONS  1. Left ventricular ejection fraction, by estimation, is 60 to 65%. The left ventricle has normal function. The left ventricle has no regional wall motion abnormalities. Left ventricular diastolic parameters were normal.  2. Right ventricular systolic function is normal. The right ventricular size is normal.  3. The mitral valve is normal in structure. No evidence  of mitral valve regurgitation. No evidence of mitral stenosis.  4. The aortic valve is normal in structure. Aortic valve regurgitation is trivial. No aortic stenosis is present.  5. The inferior vena cava is normal in size with greater than 50% respiratory variability, suggesting right atrial pressure of 3 mmHg. FINDINGS  Left Ventricle: Left ventricular ejection fraction, by estimation, is 60 to 65%. The left ventricle has normal function. The left ventricle has no regional wall motion abnormalities. The left ventricular internal cavity size was normal in size. There is  no left ventricular hypertrophy. Left ventricular diastolic parameters were normal. Right Ventricle: The right ventricular size is normal. No increase in right ventricular wall thickness. Right ventricular systolic function is normal. Left Atrium: Left atrial size was normal in size. Right Atrium: Right atrial size was normal in size. Pericardium: There is no evidence of pericardial effusion. Mitral Valve: The mitral valve is normal in structure. Normal mobility of the mitral valve leaflets. No evidence of mitral valve regurgitation. No evidence of mitral valve stenosis. Tricuspid Valve: The tricuspid valve is normal in  structure. Tricuspid valve regurgitation is trivial. No evidence of tricuspid stenosis. Aortic Valve: The aortic valve is normal in structure. Aortic valve regurgitation is trivial. No aortic stenosis is present. Pulmonic Valve: The pulmonic valve was not well visualized. Pulmonic valve regurgitation is trivial. No evidence of pulmonic stenosis. Aorta: The aortic root is normal in size and structure. Venous: The inferior vena cava is normal in size with greater than 50% respiratory variability, suggesting right atrial pressure of 3 mmHg. IAS/Shunts: No atrial level shunt detected by color flow Doppler.  LEFT VENTRICLE PLAX 2D LVIDd:         4.13 cm  Diastology LVIDs:         3.27 cm  LV e' lateral:   6.64 cm/s LV PW:         0.98 cm  LV E/e' lateral: 9.1 LV IVS:        0.82 cm  LV e' medial:    6.09 cm/s LVOT diam:     1.80 cm  LV E/e' medial:  9.9 LVOT Area:     2.54 cm  RIGHT VENTRICLE RV S prime:     11.90 cm/s TAPSE (M-mode): 2.5 cm LEFT ATRIUM             Index       RIGHT ATRIUM           Index LA diam:        3.10 cm 2.01 cm/m  RA Area:     15.80 cm LA Vol (A2C):   53.8 ml 34.95 ml/m RA Volume:   40.00 ml  25.99 ml/m LA Vol (A4C):   41.9 ml 27.22 ml/m LA Biplane Vol: 49.1 ml 31.90 ml/m   AORTA Ao Root diam: 2.80 cm MITRAL VALVE MV Area (PHT): 2.95 cm    SHUNTS MV Decel Time: 257 msec    Systemic Diam: 1.80 cm MV E velocity: 60.10 cm/s MV A velocity: 65.50 cm/s MV E/A ratio:  0.92 Jenkins Rouge MD Electronically signed by Jenkins Rouge MD Signature Date/Time: 03/01/2020/11:48:55 AM    Final     Assessment: 64 y/o female with history of alcohol abuse, presenting to ED via EMS 02/27/20 due to abdominal pain, poor oral intake, found to have hyponatremia with sodium of 122, worsening renal function with creatinine of 1.47. Lactic acid 5.5, lipase 122. CT with acute on chronic pancreatitis with mildly complex pseudocyst. She  was admitted with sepsis with UTI (Ecoli) and bacteremia (Streptococcus species).  Clinically has improved since admission.   Acute on chronic pancreatitis: suspected alcohol related. CT with ill-defined mass proximal body of pancreas 3.2X3.2cm. No pancreatic or CBD dilation. 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 Clinically, she has improved, tolerating diet without pain, and appropriate for discharge home. I do note that CA 19-9 was resulted and elevated at 74. Would recommend EUS as outpatient.   Anemia: Hgb 10.2 on admission, down to 8.7 but remaining stable. likely dilutional. Chronic mild normocytic anemia dating back several years but no prior colonoscopy. EGD fairly recently Nov 2020. Anemia multifactorial in setting of malnutrition, ETOH abuse. Will follow-up as outpatient.   Clinically stable from a GI perspective. Will sign off and ensure outpatient follow-up.    Plan: Continue Protonix Creon 72,000 units with meals (TID) and 36,000 unit with snacks (BID) ETOH cessation Arranging outpatient follow-up  Will discuss colonoscopy as outpatient and further pancreatic imaging as appropriate (EUS) Continue low fat diet Signing off   Annitta Needs, PhD, ANP-BC [facility]    LOS: 4 days    03/02/2020, 8:09 AM

## 2020-03-03 LAB — CBC WITH DIFFERENTIAL/PLATELET
Abs Immature Granulocytes: 0.07 10*3/uL (ref 0.00–0.07)
Basophils Absolute: 0.1 10*3/uL (ref 0.0–0.1)
Basophils Relative: 1 %
Eosinophils Absolute: 0.1 10*3/uL (ref 0.0–0.5)
Eosinophils Relative: 2 %
HCT: 27.3 % — ABNORMAL LOW (ref 36.0–46.0)
Hemoglobin: 8.7 g/dL — ABNORMAL LOW (ref 12.0–15.0)
Immature Granulocytes: 1 %
Lymphocytes Relative: 32 %
Lymphs Abs: 1.8 10*3/uL (ref 0.7–4.0)
MCH: 32.1 pg (ref 26.0–34.0)
MCHC: 31.9 g/dL (ref 30.0–36.0)
MCV: 100.7 fL — ABNORMAL HIGH (ref 80.0–100.0)
Monocytes Absolute: 0.7 10*3/uL (ref 0.1–1.0)
Monocytes Relative: 13 %
Neutro Abs: 2.9 10*3/uL (ref 1.7–7.7)
Neutrophils Relative %: 51 %
Platelets: 340 10*3/uL (ref 150–400)
RBC: 2.71 MIL/uL — ABNORMAL LOW (ref 3.87–5.11)
RDW: 15 % (ref 11.5–15.5)
WBC: 5.6 10*3/uL (ref 4.0–10.5)
nRBC: 0 % (ref 0.0–0.2)

## 2020-03-03 LAB — COMPREHENSIVE METABOLIC PANEL
ALT: 11 U/L (ref 0–44)
AST: 24 U/L (ref 15–41)
Albumin: 2.5 g/dL — ABNORMAL LOW (ref 3.5–5.0)
Alkaline Phosphatase: 69 U/L (ref 38–126)
Anion gap: 10 (ref 5–15)
BUN: <5 mg/dL — ABNORMAL LOW (ref 8–23)
CO2: 23 mmol/L (ref 22–32)
Calcium: 8.7 mg/dL — ABNORMAL LOW (ref 8.9–10.3)
Chloride: 95 mmol/L — ABNORMAL LOW (ref 98–111)
Creatinine, Ser: 0.53 mg/dL (ref 0.44–1.00)
GFR calc Af Amer: >60 mL/min (ref >60)
GFR calc non Af Amer: >60 mL/min (ref >60)
Glucose, Bld: 121 mg/dL — ABNORMAL HIGH (ref 70–99)
Potassium: 3.8 mmol/L (ref 3.5–5.1)
Sodium: 128 mmol/L — ABNORMAL LOW (ref 135–145)
Total Bilirubin: 0.3 mg/dL (ref 0.3–1.2)
Total Protein: 6.3 g/dL — ABNORMAL LOW (ref 6.5–8.1)

## 2020-03-03 LAB — MAGNESIUM: Magnesium: 1.5 mg/dL — ABNORMAL LOW (ref 1.7–2.4)

## 2020-03-03 MED ORDER — SODIUM CHLORIDE 0.9% FLUSH: 10.0000 mL | INTRAVENOUS | Status: DC | PRN

## 2020-03-03 MED ORDER — MAGNESIUM SULFATE 4 GM/100ML IV SOLN
4.0000 g | Freq: Once | INTRAVENOUS | Status: AC
  Administered 2020-03-03: 4 g via INTRAVENOUS
  Filled 2020-03-03: qty 100

## 2020-03-03 MED ORDER — SODIUM CHLORIDE 0.9% FLUSH
10.0000 mL | Freq: Two times a day (BID) | INTRAVENOUS | Status: DC
  Administered 2020-03-03 – 2020-03-04 (×2): 10 mL

## 2020-03-03 NOTE — Progress Notes (Signed)
Peripherally Inserted Central Catheter Placement  The IV Nurse has discussed with the patient and/or persons authorized to consent for the patient, the purpose of this procedure and the potential benefits and risks involved with this procedure.  The benefits include less needle sticks, lab draws from the catheter, and the patient may be discharged home with the catheter. Risks include, but not limited to, infection, bleeding, blood clot (thrombus formation), and puncture of an artery; nerve damage and irregular heartbeat and possibility to perform a PICC exchange if needed/ordered by physician.  Alternatives to this procedure were also discussed.  Bard Power PICC patient education guide, fact sheet on infection prevention and patient information card has been provided to patient /or left at bedside.    PICC Placement Documentation  PICC Single Lumen 03/03/20 PICC Right Brachial 40 cm 4 cm (Active)  Indication for Insertion or Continuance of Line Home intravenous therapies (PICC only) 03/03/20 1005  Exposed Catheter (cm) 2 cm 03/03/20 1005  Site Assessment Clean;Dry;Intact 03/03/20 1005  Line Status Blood return noted;Flushed;Saline locked 03/03/20 1005  Dressing Type Transparent 03/03/20 1005  Dressing Status Clean;Dry;Intact;Antimicrobial disc in place 03/03/20 [place] Not Applicable 10/15/41 8887  Line Care Connections checked and tightened 03/03/20 1005  Dressing Change Due 03/10/20 03/03/20 1005       Jeanice Lim M 03/03/2020, 10:08 AM

## 2020-03-03 NOTE — Progress Notes (Signed)
Daniel RN notified Tiffany SWOT RN is to place PICC line today.

## 2020-03-03 NOTE — Care Management (Signed)
Per Pam with [facility]. Husband is uneasy about administering IV abx, plan is for another family member to administer, teaching can not be done until Monday. Plan as of later in day on 6/18 was for pt to get abx does on 6/20 and discharge after for first home dose to be given on 6/21.

## 2020-03-03 NOTE — Progress Notes (Signed)
PROGRESS NOTE  Judy Davis RFF:[id number] DOB: 21-Mar-1956 DOA: 02/27/2020 PCP: Health, [facility]  HPI/Recap of past 24 hours: 64 y.o.femalewith history of hypertension and chronic alcoholism was sent to the ED by family members when her brother noted that the past several days she has had poor oral intake and has been complaining of abdominal pain. She is not eating or drinking well. She has been weak and frail and not ambulating. Lying in the bed most of the time.Her abdominal pain seems to be worsened with eating and that is why she has not been eating that much. She describes her pain as sharp radiating into the back with burning and constantly aching. She had an EGD by Dr. Oneida Alar in November 2020 with findings of grade C esophagitis and gastritis. H. pylori was negative.The patient was found to have sepsis secondary to bacteremia and UTI. The patient also was noted to have an acute on chronic pancreatitis exacerbation. GI was consulted to assist with management.  03/03/20: Seen and examined.  No acute events overnight. No new complaints.  Discharge delayed due to pending PICC line placement and home health services access which will start on Monday.  Appreciate case manager's assistance.    Assessment/Plan: Principal Problem:   Severe sepsis (HCC) Active Problems:   Hyponatremia   HTN (hypertension)   Lower urinary tract infectious disease   Alcohol abuse   Lactic acidosis   Generalized abdominal pain   GERD (gastroesophageal reflux disease)   Altered mental state   Sinus tachycardia   Acute on chronic pancreatitis (HCC)   Pancreatic mass   Pancreatic lesion   Severe Vitamin D deficiency   Malnutrition of moderate degree   Streptococcal sepsis, unspecified ([place])   Bacteremia due to Gram-positive bacteria   Acute kidney injury ([place])   Sepsis secondary to Streptococcus anginosus bacteremia and E. coli UTI -Present on admission -Continue  ceftriaxone -Lactic acid peaked at 5.5 -Continue IV fluids>>saline lock -UA21-50 WBC -Chest x-ray--no consolidations  Streptococcal bacteremia--strep anginosus -Source unclear from blood cultures drawn on 02/27/2020 -Repeat blood culturesdrawn on 02/29/2020 neg to date -Echocardiogram-EF 60-65%, no vegetation, no WMA -Continue ceftriaxone 2 g daily -PICC placed 03/03/20 -plan ceftriaxone IV with last day on 03/10/20  E. coli UTI -Urine culture taken on 02/27/2020 growing E. 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 with mass-effect causing dilated pancreatic duct;surrounding inflammation adjacent to the pancreas consistent with residual acute pancreatitis -Tolerating a soft diet.  Hypovolemic hyponatremia Encourage oral intake. Normal TSH-TSH 1.795 Serum sodium uptrending 128 from 127 -likely due to SIADH from acute medical illness  Resolving acute metabolic encephalopathy -Secondary to sepsis/infectious process -Overall improved  Resolved hypokalemia Serum potassium 3.8  Hypomagnesemia Magnesium 1.5 Repleted with IV magnesium 4 g once.  Hypertension -Patient initially had soft blood pressures Blood pressure is stable  Vitamin D deficiency -Started on Drisdol, continue    Status is: Inpatient  Remains inpatient appropriate because:IV treatments appropriate due to intensity of illness or inability to take PO   Dispo: The patient is from:Home Anticipated d/c is JT:TSVX Anticipated d/c date is: 03/04/2020 Patient currently is not medically stable to d/c.  Pending access to home health services for IV antibiotics infusion.        Family Communication:noFamily at bedside  Consultants:GI, GI will discuss  colonoscopy as outpatient and further pancreatic imaging  is appropriate EUS.  Recommended to continue low-fat diet.  Signed off on 03/02/2020.  Code Status: FULL   DVT Prophylaxis: [place] Lovenox daily.    Antibiotics: Ceftriaxone 6/14>>> 03/10/20 Metronidazole 6/14>>6/16         Objective: Vitals:   03/02/20 0751 03/02/20 1414 03/02/20 2127 03/03/20 0550  BP:  (!) 148/85 140/77 (!) 143/77  Pulse:  83 86 85  Resp:  17 18 17   Temp:  98.6 F (37 C) 99 F (37.2 C) 98.6 F (37 C)  TempSrc:  Oral Oral Oral  SpO2: 97% 100% 100% 100%  Weight:      Height:        Intake/Output Summary (Last 24 hours) at 03/03/2020 1209 Last data filed at 03/03/2020 0900 Gross per 24 hour  Intake 720 ml  Output 1450 ml  Net -730 ml   Filed Weights   02/27/20 0437 02/27/20 1500 02/28/20 0500  Weight: 41 kg 49 kg 49 kg    Exam:  . General: 64 y.o. year-old female well developed well nourished in no acute distress.  Alert and interactive. . Cardiovascular: Regular rate and rhythm with no rubs or gallops.  No thyromegaly or JVD noted.   Marland Kitchen Respiratory: Clear to auscultation with no wheezes or rales. Good inspiratory effort. . Abdomen: Soft nontender nondistended with normal bowel sounds x4 quadrants. . Musculoskeletal: No lower extremity edema. 2/4 pulses in all 4 extremities. Marland Kitchen Psychiatry: Mood is appropriate for condition and setting   Data Reviewed: CBC: Recent Labs  Lab 02/28/20 0406 02/29/20 0438 03/01/20 0457 03/02/20 0602 03/03/20 0654  WBC 19.6* 12.5* 8.1 6.0 5.6  NEUTROABS 17.2* 10.3* 5.5 3.1 2.9  HGB 8.7* 8.8* 8.9* 8.8* 8.7*  HCT 27.4* 26.6* 26.9* 27.4* 27.3*  MCV 101.5* 98.2 97.8 98.9 100.7*  PLT 298 312 330 329 921   Basic Metabolic Panel: Recent Labs  Lab 02/27/20 0447 02/27/20 1515 02/28/20 0406 02/29/20 0438 03/01/20 0457 03/02/20 0602 03/03/20 0654  NA   < >  --  129* 127* 126* 127* 128*  K   < >  --  3.1* 2.7* 3.8 3.7 3.8  CL   < >  --  100  96* 96* 96* 95*  CO2   < >  --  23 25 23 25 23   GLUCOSE   < >  --  80 97 104* 99 121*  BUN   < >  --  8 <5* <5* <5* <5*  CREATININE   < >  --  0.60 0.52 0.48 0.49 0.53  CALCIUM   < >  --  8.1* 8.0* 8.2* 8.4* 8.7*  MG   < > 1.7 1.8 1.7 1.6* 1.6* 1.5*  PHOS  --  3.1  --   --   --   --   --    < > = values in this interval not displayed.   GFR: Estimated Creatinine Clearance: 54.2 mL/min (by C-G formula based on SCr of 0.53 mg/dL). Liver Function Tests: Recent Labs  Lab 02/28/20 0406 02/29/20 0438 03/01/20 0457 03/02/20 0602 03/03/20 0654  AST 32 19 18 22 24   ALT 16 13 11 11 11   ALKPHOS 69 61 74 65 69  BILITOT 0.6 0.2* 0.2* 0.3 0.3  PROT 6.0* 5.6* 5.9* 5.9* 6.3*  ALBUMIN 2.3* 2.2* 2.4* 2.4* 2.5*   Recent Labs  Lab 02/27/20 0832 02/28/20 0406 02/29/20 0438  LIPASE 112* 63* 102*   No results for input(s): AMMONIA in the last 168 hours. Coagulation  Profile: Recent Labs  Lab 02/28/20 0406  INR 1.8*   Cardiac Enzymes: Recent Labs  Lab 02/27/20 0608  CKTOTAL 26*   BNP (last 3 results) No results for input(s): PROBNP in the last 8760 hours. HbA1C: No results for input(s): HGBA1C in the last 72 hours. CBG: No results for input(s): GLUCAP in the last 168 hours. Lipid Profile: Recent Labs    03/01/20 0457  CHOL 84  HDL 28*  LDLCALC 49  TRIG 37  CHOLHDL 3.0   Thyroid Function Tests: No results for input(s): TSH, T4TOTAL, FREET4, T3FREE, THYROIDAB in the last 72 hours. Anemia Panel: Recent Labs    03/01/20 0457  VITAMINB12 315  FOLATE 9.6  FERRITIN 435*  TIBC 160*  IRON 33   Urine analysis:    Component Value Date/Time   COLORURINE AMBER (A) 02/27/2020 0502   APPEARANCEUR CLOUDY (A) 02/27/2020 0502   LABSPEC 1.012 02/27/2020 0502   PHURINE 7.0 02/27/2020 0502   GLUCOSEU NEGATIVE 02/27/2020 0502   HGBUR NEGATIVE 02/27/2020 0502   BILIRUBINUR NEGATIVE 02/27/2020 0502   KETONESUR NEGATIVE 02/27/2020 0502   PROTEINUR 30 (A) 02/27/2020 0502    UROBILINOGEN 0.2 05/10/2015 1000   NITRITE NEGATIVE 02/27/2020 0502   LEUKOCYTESUR TRACE (A) 02/27/2020 0502   Sepsis Labs: @LABRCNTIP (procalcitonin:4,lacticidven:4)  ) Recent Results (from the past 240 hour(s))  Urine culture     Status: Abnormal   Collection Time: 02/27/20  7:27 AM   Specimen: Urine, Clean Catch  Result Value Ref Range Status   Specimen Description   Final    URINE, CLEAN CATCH Performed at [facility], [address] [place], [place] [address]    Special Requests   Final    NONE Performed at [facility], [address], [place], [place] [address]    Culture >=100,000 COLONIES/mL ESCHERICHIA COLI (A)  Final   Report Status 02/29/2020 FINAL  Final   Organism ID, Bacteria ESCHERICHIA COLI (A)  Final      Susceptibility   Escherichia coli - MIC*    AMPICILLIN 4 SENSITIVE Sensitive     CEFAZOLIN <=4 SENSITIVE Sensitive     CEFTRIAXONE <=0.25 SENSITIVE Sensitive     CIPROFLOXACIN <=0.25 SENSITIVE Sensitive     GENTAMICIN <=1 SENSITIVE Sensitive     IMIPENEM <=0.25 SENSITIVE Sensitive     NITROFURANTOIN <=16 SENSITIVE Sensitive     TRIMETH/SULFA <=20 SENSITIVE Sensitive     AMPICILLIN/SULBACTAM <=2 SENSITIVE Sensitive     PIP/TAZO <=4 SENSITIVE Sensitive     * >=100,000 COLONIES/mL ESCHERICHIA COLI  Culture, blood (Routine X 2) w Reflex to ID Panel     Status: Abnormal   Collection Time: 02/27/20  8:07 AM   Specimen: BLOOD LEFT HAND  Result Value Ref Range Status   Specimen Description   Final    BLOOD LEFT HAND Performed at [facility], [address], [place], [place] [address]    Special Requests   Final    Blood Culture results may not be optimal due to an inadequate volume of blood received in culture bottles [place] Performed at [facility], [address], [place], [place] [address]    Culture  Setup Time   Final    GRAM POSITIVE COCCI IN CHAINS IN BOTH AEROBIC AND ANAEROBIC BOTTLES Gram Stain Report Called to,Read  Back By and Verified With: MURPHY,E 02/28/2020 @1210  BY JONES, T Organism ID to follow CRITICAL RESULT CALLED TO, READ BACK BY AND VERIFIED WITH: E MURPHY RN 02/28/20 1900  JDW Performed at [facility], [place] [address] [place], [place] [address]    Culture STREPTOCOCCUS ANGINOSIS (A)  Final   Report Status 03/02/2020 FINAL  Final   Organism ID, Bacteria STREPTOCOCCUS ANGINOSIS  Final      Susceptibility   Streptococcus anginosis - MIC*    PENICILLIN <=0.06 SENSITIVE Sensitive     CEFTRIAXONE 0.25 SENSITIVE Sensitive     ERYTHROMYCIN <=0.12 SENSITIVE Sensitive     LEVOFLOXACIN <=0.25 SENSITIVE Sensitive     VANCOMYCIN 0.5 SENSITIVE Sensitive     * STREPTOCOCCUS ANGINOSIS  Blood Culture ID Panel (Reflexed)     Status: Abnormal   Collection Time: 02/27/20  8:07 AM  Result Value Ref Range Status   Enterococcus species NOT DETECTED NOT DETECTED Final   Listeria monocytogenes NOT DETECTED NOT DETECTED Final   Staphylococcus species NOT DETECTED NOT DETECTED Final   Staphylococcus aureus (BCID) NOT DETECTED NOT DETECTED Final   Streptococcus species DETECTED (A) NOT DETECTED Final    Comment: Not Enterococcus species, Streptococcus agalactiae, Streptococcus pyogenes, or Streptococcus pneumoniae. CRITICAL RESULT CALLED TO, READ BACK BY AND VERIFIED WITH: E MURPHY RN 02/28/20 1900 JDW    Streptococcus agalactiae NOT DETECTED NOT DETECTED Final   Streptococcus pneumoniae NOT DETECTED NOT DETECTED Final   Streptococcus pyogenes NOT DETECTED NOT DETECTED Final   Acinetobacter baumannii NOT DETECTED NOT DETECTED Final   Enterobacteriaceae species NOT DETECTED NOT DETECTED Final   Enterobacter cloacae complex NOT DETECTED NOT DETECTED Final   Escherichia coli NOT DETECTED NOT DETECTED Final   Klebsiella oxytoca NOT DETECTED NOT DETECTED Final   Klebsiella pneumoniae NOT DETECTED NOT DETECTED Final   Proteus species NOT DETECTED NOT DETECTED Final   Serratia marcescens NOT DETECTED NOT  DETECTED Final   Haemophilus influenzae NOT DETECTED NOT DETECTED Final   Neisseria meningitidis NOT DETECTED NOT DETECTED Final   Pseudomonas aeruginosa NOT DETECTED NOT DETECTED Final   Candida albicans NOT DETECTED NOT DETECTED Final   Candida glabrata NOT DETECTED NOT DETECTED Final   Candida krusei NOT DETECTED NOT DETECTED Final   Candida parapsilosis NOT DETECTED NOT DETECTED Final   Candida tropicalis NOT DETECTED NOT DETECTED Final    Comment: Performed at [facility], [place] [address], [place], [place] [address]  Culture, blood (Routine X 2) w Reflex to ID Panel     Status: Abnormal   Collection Time: 02/27/20  8:18 AM   Specimen: BLOOD RIGHT HAND  Result Value Ref Range Status   Specimen Description   Final    BLOOD RIGHT HAND Performed at [facility], [address] [place], [place] [address]    Special Requests   Final    Blood Culture results may not be optimal due to an inadequate volume of blood received in culture bottles BOTTLES DRAWN AEROBIC ONLY Performed at [facility], [address] [place],  [address]    Culture  Setup Time   Final    AEROBIC BOTTLE ONLY GRAM POSITIVE COCCI IN CHAINS Gram Stain Report Called to,Read Back By and Verified With: H TETREAULT,RN @0517  02/29/20 [facility] Performed at [facility], [address] [place],  [address]    Culture STREPTOCOCCUS ANGINOSIS (A)  Final   Report Status 03/02/2020 FINAL  Final   Organism ID, Bacteria STREPTOCOCCUS ANGINOSIS  Final      Susceptibility   Streptococcus anginosis - MIC*    PENICILLIN <=0.06 SENSITIVE Sensitive     CEFTRIAXONE 0.25 SENSITIVE Sensitive  ERYTHROMYCIN <=0.12 SENSITIVE Sensitive     LEVOFLOXACIN 0.5 SENSITIVE Sensitive     VANCOMYCIN 0.5 SENSITIVE Sensitive     * STREPTOCOCCUS ANGINOSIS  SARS Coronavirus 2 by RT PCR (hospital order, performed in [place] hospital lab) Nasopharyngeal     Status: None   Collection Time: 02/27/20  8:24 AM   Specimen:  Nasopharyngeal  Result Value Ref Range Status   SARS Coronavirus 2 NEGATIVE NEGATIVE Final    Comment: (NOTE) SARS-CoV-2 target nucleic acids are NOT DETECTED.  The SARS-CoV-2 RNA is generally detectable in upper and lower respiratory specimens during the acute phase of infection. The lowest concentration of SARS-CoV-2 viral copies this assay can detect is 250 copies / mL. A negative result does not preclude SARS-CoV-2 infection and should not be used as the sole basis for treatment or other patient management decisions.  A negative result may occur with improper specimen collection / handling, submission of specimen other than nasopharyngeal swab, presence of viral mutation(s) within the areas targeted by this assay, and inadequate number of viral copies (<250 copies / mL). A negative result must be combined with clinical observations, patient history, and epidemiological information.  Fact Sheet for Patients:   StrictlyIdeas.no  Fact Sheet for Healthcare Providers: BankingDealers.co.za  This test is not yet approved or  cleared by the [place] FDA and has been authorized for detection and/or diagnosis of SARS-CoV-2 by FDA under an Emergency Use Authorization (EUA).  This EUA will remain in effect (meaning this test can be used) for the duration of the COVID-19 declaration under Section 564(b)(1) of the Act, 21 U.S.C. section 360bbb-3(b)(1), unless the authorization is terminated or revoked sooner.  Performed at [facility], [address], [place], [place] [address]   MRSA PCR Screening     Status: None   Collection Time: 02/27/20  8:24 AM   Specimen: Nasopharyngeal  Result Value Ref Range Status   MRSA by PCR NEGATIVE NEGATIVE Final    Comment:        The GeneXpert MRSA Assay (FDA approved for NASAL specimens only), is one component of a comprehensive MRSA colonization surveillance program. It is not intended to  diagnose MRSA infection nor to guide or monitor treatment for MRSA infections. Performed at [facility], [address], [place], [place] [address]   C Difficile Quick Screen w PCR reflex     Status: None   Collection Time: 02/27/20  3:28 PM   Specimen: STOOL  Result Value Ref Range Status   C Diff antigen NEGATIVE NEGATIVE Final   C Diff toxin NEGATIVE NEGATIVE Final   C Diff interpretation No C. difficile detected.  Final    Comment: Performed at [facility], [address], [place], [place] [address]  Culture, blood (Routine X 2) w Reflex to ID Panel     Status: None (Preliminary result)   Collection Time: 02/29/20  2:13 PM   Specimen: BLOOD RIGHT HAND  Result Value Ref Range Status   Specimen Description BLOOD RIGHT HAND  Final   Special Requests   Final    BOTTLES DRAWN AEROBIC ONLY Blood Culture results may not be optimal due to an inadequate volume of blood received in culture bottles   Culture   Final    NO GROWTH 3 DAYS Performed at [facility], [address] [place],  [address]    Report Status PENDING  Incomplete  Culture, blood (Routine X 2) w Reflex to ID Panel     Status: None (  Preliminary result)   Collection Time: 02/29/20  2:21 PM   Specimen: BLOOD LEFT HAND  Result Value Ref Range Status   Specimen Description BLOOD LEFT HAND  Final   Special Requests   Final    BOTTLES DRAWN AEROBIC ONLY Blood Culture results may not be optimal due to an inadequate volume of blood received in culture bottles   Culture   Final    NO GROWTH 3 DAYS Performed at  [facility], [address], [place], [place] [address]    Report Status PENDING  Incomplete      Studies: No results found.  Scheduled Meds: . Chlorhexidine Gluconate Cloth  6 each Topical Daily  . enoxaparin (LOVENOX) injection  40 mg Subcutaneous Q24H  . feeding supplement  1 Container Oral TID BM  . folic acid  1 mg Oral Daily  . lipase/protease/amylase  36,000 Units Oral With snacks  .  lipase/protease/amylase  72,000 Units Oral TID WC  . multivitamin with minerals  1 tablet Oral Daily  . pantoprazole (PROTONIX) IV  40 mg Intravenous Q24H  . sodium chloride flush  10-40 mL Intracatheter Q12H  . thiamine  100 mg Oral Daily   Or  . thiamine  100 mg Intravenous Daily  . Vitamin D (Ergocalciferol)  50,000 Units Oral Q7 days    Continuous Infusions: . cefTRIAXone (ROCEPHIN)  IV 2 g (03/03/20 1055)     LOS: 5 days     Kayleen Memos, MD [facility] Pager [phone]  If 7PM-7AM, please contact night-coverage [email or web address] Password [email or web address] 03/03/2020, 12:09 PM

## 2020-03-04 MED ORDER — FOLIC ACID 1 MG PO TABS: 1.0000 mg | ORAL_TABLET | Freq: Every day | ORAL | 0 refills | Status: AC

## 2020-03-04 MED ORDER — CEFTRIAXONE IV (FOR PTA / DISCHARGE USE ONLY): 2.0000 g | INTRAVENOUS | 0 refills | Status: AC

## 2020-03-04 MED ORDER — PANCRELIPASE (LIP-PROT-AMYL) 36000-114000 UNITS PO CPEP: 36000.0000 [IU] | ORAL_CAPSULE | Freq: Two times a day (BID) | ORAL | 0 refills | Status: AC

## 2020-03-04 MED ORDER — THIAMINE HCL 100 MG PO TABS: 100.0000 mg | ORAL_TABLET | Freq: Every day | ORAL | 0 refills | Status: AC

## 2020-03-04 MED ORDER — ADULT MULTIVITAMIN W/MINERALS CH: 1.0000 | ORAL_TABLET | Freq: Every day | ORAL | 0 refills | Status: AC

## 2020-03-04 MED ORDER — PANTOPRAZOLE SODIUM 40 MG PO TBEC
40.0000 mg | DELAYED_RELEASE_TABLET | Freq: Every day | ORAL | 0 refills | Status: DC
Start: 2020-03-04 — End: 2021-01-25

## 2020-03-04 MED ORDER — PANCRELIPASE (LIP-PROT-AMYL) 36000-114000 UNITS PO CPEP: 72000.0000 [IU] | ORAL_CAPSULE | Freq: Three times a day (TID) | ORAL | 0 refills | Status: AC

## 2020-03-04 NOTE — Discharge Summary (Signed)
Discharge Summary  Judy Davis WSF:[id number] DOB: [birth year]  PCP: Sandria Manly [place] date: 0/17/4944 Discharge date: 03/04/2020  Time spent: 35 minutes  Recommendations for Outpatient Follow-up:  1. Follow up with PCP within a week. 2. Follow-up with GI in 1 to 2 weeks 3. Please obtain BMP/CBC in one week 4. Ceftriaxone 2 grams daily--last day on 03/14/20 5. Removed PICC line after last dose of IV ceftriaxone  Discharge Diagnoses:  Active Hospital Problems   Diagnosis Date Noted  . Severe sepsis ([place]) 02/27/2020  . Acute kidney injury ([place])   . Streptococcal sepsis, unspecified ([place]) 02/29/2020  . Bacteremia due to Gram-positive bacteria 02/29/2020  . Severe Vitamin D deficiency 02/28/2020  . Malnutrition of moderate degree 02/28/2020  . Lactic acidosis 02/27/2020  . Generalized abdominal pain 02/27/2020  . GERD (gastroesophageal reflux disease) 02/27/2020  . Altered mental state 02/27/2020  . Sinus tachycardia 02/27/2020  . Acute on chronic pancreatitis ([place]) 02/27/2020  . Pancreatic mass 02/27/2020  . Pancreatic lesion 02/27/2020  . Alcohol abuse 08/08/2019  . Lower urinary tract infectious disease 07/06/2011  . HTN (hypertension) 07/05/2011  . Hyponatremia 07/05/2011    Resolved Hospital Problems  No resolved problems to display.    Discharge Condition: Stable  Diet recommendation: Low-fat diet.  Vitals:   03/04/20 0000 03/04/20 0601  BP: 121/79 130/86  Pulse: 96 (!) 102  Resp: 18 17  Temp: 99 F (37.2 C) 98.8 F (37.1 C)  SpO2: 100% 100%    History of present illness:  64 y.o.femalewith history of hypertension and chronic alcoholism was sent to the ED by family members when her brother noted that the past several days she has had poor oral intake and has been complaining of abdominal pain. She is not eating or drinking well. She has been weak and frail and not ambulating. Lying in the bed most of the time.Her abdominal  pain seems to be worsened with eating and that is why she has not been eating that much. She describes her pain as sharp radiating into the back with burning and constantly aching. She had an EGD by Dr. Oneida Alar in November 2020 with findings of grade C esophagitis and gastritis. H. pylori was negative.The patient was found to have sepsis secondary to bacteremia and UTI. The patient also was noted to have an acute on chronic pancreatitis exacerbation. GI was consulted to assist with management.  Seen by GI.  No overt GI bleeding.  Tolerating diet.  GI signed off on 03/02/2020.  Recommended to continue Protonix, Creon 72,000 units with meals 3 times daily and 36,000 units with snacks twice daily.  Recommended alcohol cessation.  GI arranging outpatient follow-up, will discuss colonoscopy as outpatient and further pancreatic imaging as appropriate (EUS).  03/04/20: Seen and examined.  No acute events overnight.  No new complaints.    She is eager to go home.  PICC line in place.  We will continue IV antibiotics Rocephin 2 g daily at home for 14 days from 02/29/2020.  End date 03/14/2020.   Hospital Course:  Principal Problem:   Severe sepsis ([place]) Active Problems:   Hyponatremia   HTN (hypertension)   Lower urinary tract infectious disease   Alcohol abuse   Lactic acidosis   Generalized abdominal pain   GERD (gastroesophageal reflux disease)   Altered mental state   Sinus tachycardia   Acute on chronic pancreatitis ([facility])   Pancreatic mass   Pancreatic lesion   Severe Vitamin D deficiency  Malnutrition of moderate degree   Streptococcal sepsis, unspecified ([place])   Bacteremia due to Gram-positive bacteria   Acute kidney injury ([place])   Sepsis, resolved, secondary to Streptococcus anginosus bacteremia and E. coli UTI -Present on admission -Continue IV ceftriaxone 2 g daily, end date 03/14/2020. -Lactic acid peaked at 5.5 then normalized. -Received IV fluids -Leukocytosis resolved,  afebrile. -Chest x-ray--no consolidations  Streptococcal bacteremia--strep anginosus -Source unclear -Blood cultures drawn on 02/27/2020, positive for strep anginosus, pansensitive. -Repeat blood culturesdrawn on 02/29/2020 neg to date -2D Echocardiogram-EF 60-65%, no vegetation, no WMA -Continue ceftriaxone 2 g daily x14 days from 02/29/2020.  End date 03/14/2020. -PICC placed 03/03/20 -Follow-up with your PCP within a week.  E. coli UTI -Urine culture taken on 02/27/2020 growing E. Coli, pansensitive. -Continue ceftriaxone  Alcohol dependence -Alcohol withdrawal protocol -Alcohol cessation counseling. -No signs of withdrawal at this time -Continue multivitamin, thiamine and folic acid.  Acute on chronic pancreatitis with pseudocyst -02/28/2020 MRCP--large pancreatic pseudocyst at the head of pancreas with mass-effect causing dilated pancreatic duct;surrounding inflammation adjacent to the pancreas consistent with residual acute pancreatitis -Tolerating a diet well. -Seen by GI, recommended to continue Protonix, Creon 72,000 units with meals 3 times daily and 36,000 units with snacks twice daily.  Recommended alcohol cessation.  GI arranging outpatient follow-up, will discuss colonoscopy as outpatient and further pancreatic imaging as appropriate (EUS).  Improving hypovolemic hyponatremia Continue to encourage oral intake. Normal TSH-1.795 Serum sodium uptrending 128 from 127 Follow-up with your PCP within a week.  Resolving acute metabolic encephalopathy -Secondary to sepsis/infectious process -Mentation back to baseline.  Resolved hypokalemia Serum potassium 3.8  Repleted hypomagnesemia Magnesium 1.5 Repleted with IV magnesium 4 g once. Follow-up with your PCP  History of hypertension -Patient initially had soft blood pressures Blood pressure is now at goal. Not on antihypertensives at home Follow-up with your PCP  Vitamin D deficiency -Started on Drisdol,  continue   Consultants:GI, GI will discuss colonoscopy as outpatient and further pancreatic imaging is appropriate EUS.  Recommended to continue low-fat diet.  Signed off on 03/02/2020.  Code Status: FULL       Antibiotics: Ceftriaxone 6/14>>> 03/14/20 Metronidazole 6/14>>6/16      Procedures:  PICC line placement 03/03/2020.  Consultations:  GI.  Discharge Exam: BP 130/86 (BP Location: Left Arm)   Pulse (!) 102   Temp 98.8 F (37.1 C) (Oral)   Resp 17   Ht '5\' 6"'  (1.676 m)   Wt 49 kg   SpO2 100%   BMI 17.44 kg/m  . General: 64 y.o. year-old female well developed well nourished in no acute distress.  Alert and interactive. . Cardiovascular: Regular rate and rhythm with no rubs or gallops.  No thyromegaly or JVD noted.   Marland Kitchen Respiratory: Clear to auscultation with no wheezes or rales. Good inspiratory effort. . Abdomen: Soft nontender nondistended with normal bowel sounds x4 quadrants. . Musculoskeletal: No lower extremity edema. 2/4 pulses in all 4 extremities. Marland Kitchen Psychiatry: Mood is appropriate for condition and setting  Discharge Instructions You were cared for by a hospitalist during your hospital stay. If you have any questions about your discharge medications or the care you received while you were in the hospital after you are discharged, you can call the unit and asked to speak with the hospitalist on call if the hospitalist that took care of you is not available. Once you are discharged, your primary care physician will handle any further medical issues. Please note that NO REFILLS for any  discharge medications will be authorized once you are discharged, as it is imperative that you return to your primary care physician (or establish a relationship with a primary care physician if you do not have one) for your aftercare needs so that they can reassess your need for medications and monitor your lab values.  Discharge Instructions    [facility]  pharmacist to adjust dose for Vancomycin, Aminoglycosides and other anti-infective therapies as requested by physician.   Complete by: As directed    [facility] pharmacist to adjust dose for Vancomycin, Aminoglycosides and other anti-infective therapies as requested by physician.   Complete by: As directed    [facility] to provide Cath Flo 86m   Complete by: As directed    Administer for PICC line occlusion and as ordered by physician for other access device issues.   [facility] to provide Cath Flo 267m  Complete by: As directed    Administer for PICC line occlusion and as ordered by physician for other access device issues.   Anaphylaxis Kit: Provided to treat any anaphylactic reaction to the medication being provided to the patient if First Dose or when requested by physician   Complete by: As directed    Epinephrine 14m714ml vial / amp: Administer 0.107mg214m.107ml)64mbcutaneously once for moderate to severe anaphylaxis, nurse to call physician and pharmacy when reaction occurs and call 911 if needed for immediate care   Diphenhydramine 50mg/17mV vial: Administer 25-50mg I66m PRN for first dose reaction, rash, itching, mild reaction, nurse to call physician and pharmacy when reaction occurs   Sodium Chloride 0.9% NS 500ml IV37mminister if needed for hypovolemic blood pressure drop or as ordered by physician after call to physician with anaphylactic reaction   Anaphylaxis Kit: Provided to treat any anaphylactic reaction to the medication being provided to the patient if First Dose or when requested by physician   Complete by: As directed    Epinephrine 14mg/ml v814m / amp: Administer 0.107mg (0.107m27msubcu17meously once for moderate to severe anaphylaxis, nurse to call physician and pharmacy when reaction occurs and call 911 if needed for immediate care   Diphenhydramine 50mg/ml IV 58m: Administer 25-50mg IV/IM P44mor first dose reaction, rash, itching, mild reaction,  nurse to call physician and pharmacy when reaction occurs   Sodium Chloride 0.9% NS 500ml IV: Admi25mer if needed for hypovolemic blood pressure drop or as ordered by physician after call to physician with anaphylactic reaction   Change dressing on IV access line weekly and PRN   Complete by: As directed    Change dressing on IV access line weekly and PRN   Complete by: As directed    Flush IV access with Sodium Chloride 0.9% and Heparin 10 units/ml or 100 units/ml   Complete by: As directed    Flush IV access with Sodium Chloride 0.9% and Heparin 10 units/ml or 100 units/ml   Complete by: As directed    Home infusion instructions - [facility]   Complete by: As directed    Instructions: Flush IV access with Sodium Chloride 0.9% and Heparin 10units/ml or 100units/ml   Change dressing on IV access line: Weekly and PRN   Instructions Cath Flo 2mg: Administe26mor PICC Line occlusion and as ordered by physician for other access device   [facility] pharmacist to adjust dose for: Vancomycin, Aminoglycosides and other anti-infective therapies as requested by physician   Home infusion instructions - [facility]  Complete by: As directed    Instructions: Flush IV access with Sodium Chloride 0.9% and Heparin 10units/ml or 100units/ml   Change dressing on IV access line: Weekly and PRN   Instructions Cath Flo 39m: Administer for PICC Line occlusion and as ordered by physician for other access device   [facility] pharmacist to adjust dose for: Vancomycin, Aminoglycosides and other anti-infective therapies as requested by physician   Method of administration may be changed at the discretion of home infusion pharmacist based upon assessment of the patient and/or caregiver's ability to self-administer the medication ordered   Complete by: As directed    Method of administration may be changed at the discretion of home infusion pharmacist based upon assessment  of the patient and/or caregiver's ability to self-administer the medication ordered   Complete by: As directed    Outpatient Parenteral Antibiotic Therapy Information Antibiotic: Ceftriaxone (Rocephin) IVPB; Indications for use: bacteremia; End Date: 03/10/2020   Complete by: As directed    Antibiotic: Ceftriaxone (Rocephin) IVPB   Indications for use: bacteremia   End Date: 03/10/2020     Allergies as of 03/04/2020   No Known Allergies     Medication List    TAKE these medications   cefTRIAXone  IVPB Commonly known as: ROCEPHIN Inject 2 g into the vein daily for 10 days. Indication:  Bacteremia First Dose: No Last Day of Therapy:  03-14-20 Labs - Once weekly:  CBC/D and BMP, Labs - Every other week:  ESR and CRP Method of administration: IV Push Method of administration may be changed at the discretion of home infusion pharmacist based upon assessment of the patient and/or caregiver's ability to self-administer the medication ordered. Start taking on: June 21, 29244  folic acid 1 MG tablet Commonly known as: FOLVITE Take 1 tablet (1 mg total) by mouth daily. Start taking on: March 05, 2020   lipase/protease/amylase 36000 UNITS Cpep capsule Commonly known as: CREON Take 2 capsules (72,000 Units total) by mouth 3 (three) times daily with meals for 14 days.   lipase/protease/amylase 36000 UNITS Cpep capsule Commonly known as: CREON Take 1 capsule (36,000 Units total) by mouth in the morning and at bedtime for 14 days.   multivitamin with minerals Tabs tablet Take 1 tablet by mouth daily. Start taking on: March 05, 2020   pantoprazole 40 MG tablet Commonly known as: Protonix Take 1 tablet (40 mg total) by mouth daily.   thiamine 100 MG tablet Take 1 tablet (100 mg total) by mouth daily. Start taking on: March 05, 2020   Vitamin D (Ergocalciferol) 1.25 MG (50000 UNIT) Caps capsule Commonly known as: DRISDOL Take 1 capsule (50,000 Units total) by mouth every 7 (seven)  days. Start taking on: March 08, 2020            Durable Medical Equipment  (From admission, onward)         Start     Ordered   02/28/20 1225  For home use only DME Walker rolling  Once       Question Answer Comment  Walker: With 5 Inch Wheels   Patient needs a walker to treat with the following condition Gait instability      02/28/20 1224           Discharge Care Instructions  (From admission, onward)         Start     Ordered   03/04/20 0000  Change dressing on IV access line weekly and PRN  (  Home infusion instructions - [facility] )        03/04/20 1126   03/02/20 0000  Change dressing on IV access line weekly and PRN  (Home infusion instructions - [facility] )        03/02/20 1322         No Known Allergies    The results of significant diagnostics from this hospitalization (including imaging, microbiology, ancillary and laboratory) are listed below for reference.    Significant Diagnostic Studies: CT Abdomen Pelvis Wo Contrast  Result Date: 02/27/2020 CLINICAL DATA:  Pain with nausea and vomiting EXAM: CT ABDOMEN AND PELVIS WITHOUT CONTRAST TECHNIQUE: Multidetector CT imaging of the abdomen and pelvis was performed following the standard protocol without IV contrast. Oral contrast was administered. COMPARISON:  August 07, 2019 FINDINGS: Lower chest: There is atelectatic change in the lung bases. There is no lung base edema or airspace opacity. There is coronary artery calcification. Hepatobiliary: There is a cyst in the posterior segment of the right lobe of the liver measuring 3.5 x 3.3 cm, stable. There is mild fatty infiltration near the fissure for the ligamentum teres. No other liver lesions are evident on this noncontrast enhanced study. The gallbladder wall is not appreciably thickened. There is no biliary duct dilatation. Pancreas: The pancreas appears enlarged in the head and body regions with the suggestion of pancreatic edema.  There is a somewhat ill-defined mass in the proximal body of the pancreas measuring 3.2 x 3.2 cm which has attenuation values marginally greater than is expected with a simple cyst. There are foci of pancreatic calcification which is stable consistent with chronic pancreatitis. There is mild peripancreatic fluid which extends to the level of the second portion of the duodenum. Spleen: No splenic lesions are evident. Adrenals/Urinary Tract: There is adrenal hypertrophy bilaterally. Adrenals appear stable compared to prior study. There is a 7 x 7 mm focus of increased attenuation in the lower pole of the left kidney, unchanged from previous study and likely representing a small hyperdense cyst, stable. 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 There is no hydronephrosis on either side. No appreciable renal or ureteral calculus evident on either side. Urinary bladder is midline with wall thickness within normal limits. Stomach/Bowel: There is no appreciable bowel wall or mesenteric thickening. The terminal ileum appears normal. There is mild fatty infiltration in the ileocecal valve. There is no evident bowel obstruction. There is no free air or portal venous air. Vascular/Lymphatic: There is no abdominal aortic aneurysm. There is aortic atherosclerosis. There is also scattered calcification in each common iliac artery. There is no evident adenopathy in the abdomen or pelvis. Reproductive: Uterus is absent.  No pelvic mass evident. Other: No periappendiceal region inflammation. No abscess or ascites is evident in the abdomen or pelvis. There is fat in the umbilicus. Musculoskeletal: There is degenerative change in the lumbar spine. There is degenerative change in each hip joint. No blastic or lytic bone lesions. No intramuscular lesions are evident. IMPRESSION: 1. Edema in the midportion of the pancreas with a mass in the proximal body of the pancreas measuring 3.2 x 3.2 cm, a likely mildly complicated pseudocyst. There is slight  peripancreatic fluid. Suspect acute pancreatitis. Calcification noted in the pancreas consistent with underlying chronic pancreatitis as well. With respect to mass in the body of the pancreas, further evaluation with dedicated MR or CT pre and post-contrast to further evaluate the pancreas is felt to be warranted. The pancreatic duct is not appreciably dilated. 2.  Probable hyperdense cyst in the left kidney, stable, measuring 7 mm. Left kidney malrotated. No hydronephrosis. No renal or ureteral calculus on either side. Urinary bladder wall thickness normal. 3.  No bowel obstruction.  No abscess in the abdomen or pelvis. 4. Aortic Atherosclerosis (ICD10-I70.0). Foci of coronary artery calcification and iliac artery calcification also noted. 5.  Uterus absent. Electronically Signed   By: Lowella Grip III M.D.   On: 02/27/2020 08:54   MR 3D Recon At Scanner  Result Date: 02/28/2020 CLINICAL DATA:  64 year old female with history of pancreatic lesion noted on prior CT examination. EXAM: MRI ABDOMEN WITHOUT AND WITH CONTRAST (INCLUDING MRCP) TECHNIQUE: Multiplanar multisequence MR imaging of the abdomen was performed both before and after the administration of intravenous contrast. Heavily T2-weighted images of the biliary and pancreatic ducts were obtained, and three-dimensional MRCP images were rendered by post processing. CONTRAST:  69m GADAVIST GADOBUTROL 1 MMOL/ML IV SOLN COMPARISON:  No prior abdominal MRI. CT the abdomen and pelvis 02/27/2020. FINDINGS: Comment: Portions of today's examination is considerably limited by extensive patient respiratory motion. Lower chest: Trace bilateral pleural effusions lying dependently. Hepatobiliary: Diffuse loss of signal intensity throughout the hepatic parenchyma on out of phase dual echo images indicative of hepatic steatosis. In the inferior aspect of segment 6 of the liver (axial image 29 of series 21 and coronal image 13 of series 4) there is a well-defined 3.4  x 3.2 x 4.2 cm T1 hypointense, T2 hyperintense, nonenhancing lesion, compatible with a simple cyst. No other suspicious hepatic lesions. No intra or extrahepatic biliary ductal dilatation. Gallbladder is normal in appearance. Pancreas: In the head and proximal body of the pancreas (axial image 36 of series 18 and coronal image 55 of series 20) there is a large lesion that is heterogeneous in T1 and T2 signal intensity, with predominantly T1 hypointensity the non dependently and T1 isointensity dependently with T2 hyperintensity non dependently and T2 hypointensity dependently, but no evidence of internal enhancement, favored to represent a mildly complex pancreatic cyst. Spleen:  Unremarkable. Adrenals/Urinary Tract: Bilateral kidneys and bilateral adrenal glands are normal in appearance. No hydroureteronephrosis in the visualized portions of the abdomen. Stomach/Bowel: Visualized portions are unremarkable. Vascular/Lymphatic: No aneurysm identified in the visualized abdominal vasculature. No lymphadenopathy noted in the abdomen. Other: Small amount of retroperitoneal fluid most evident adjacent to the pancreas. No significant volume of ascites noted in the visualized portions of the peritoneal cavity. Musculoskeletal: No aggressive appearing osseous lesions are noted in the visualized portions of the skeleton. IMPRESSION: 1. 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 This is exerting mass effect locally causing pancreatic ductal dilatation. There are surrounding inflammatory changes adjacent to the pancreas indicative of residual acute pancreatitis. 2. Hepatic steatosis. 3. Trace bilateral pleural effusions lying dependently. Electronically Signed   By: DVinnie LangtonM.D.   On: 02/28/2020 14:20   Portable chest 1 View  Result Date: 02/28/2020 CLINICAL DATA:  Severe sepsis EXAM: PORTABLE CHEST 1 VIEW  COMPARISON:  Yesterday FINDINGS: Normal heart size and mediastinal contours. Mild scarring or atelectasis. No acute infiltrate or edema. No effusion or pneumothorax. No acute osseous findings. IMPRESSION: No active disease. Electronically Signed   By: JMonte FantasiaM.D.   On: 02/28/2020 06:33   DG Chest Port 1 View  Result Date: 02/27/2020 CLINICAL DATA:  Chest and abdominal pain EXAM: PORTABLE CHEST 1 VIEW COMPARISON:  Chest radiograph January 10, 2012 FINDINGS: There is slight atelectatic change in the bases. Lungs otherwise are clear. Heart size and pulmonary vascularity are normal. No adenopathy. There is aortic atherosclerosis. No bone lesions. IMPRESSION: Mild bibasilar atelectasis. Lungs otherwise clear. Cardiac silhouette normal. Aortic Atherosclerosis (ICD10-I70.0). Electronically Signed   By: Lowella Grip III M.D.   On: 02/27/2020 08:29   MR ABDOMEN MRCP W WO CONTAST  Result Date: 02/28/2020 CLINICAL DATA:  64 year old female with history of pancreatic lesion noted on prior CT examination. EXAM: MRI ABDOMEN WITHOUT AND WITH CONTRAST (INCLUDING MRCP) TECHNIQUE: Multiplanar multisequence MR imaging of the abdomen was performed both before and after the administration of intravenous contrast. Heavily T2-weighted images of the biliary and pancreatic ducts were obtained, and three-dimensional MRCP images were rendered by post processing. CONTRAST:  22m GADAVIST GADOBUTROL 1 MMOL/ML IV SOLN COMPARISON:  No prior abdominal MRI. CT the abdomen and pelvis 02/27/2020. FINDINGS: Comment: Portions of today's examination is considerably limited by extensive patient respiratory motion. Lower chest: Trace bilateral pleural effusions lying dependently. Hepatobiliary: Diffuse loss of signal intensity throughout the hepatic parenchyma on out of phase dual echo images indicative of hepatic steatosis. In the inferior aspect of segment 6 of the liver (axial image 29 of series 21 and coronal image 13 of series 4)  there is a well-defined 3.4 x 3.2 x 4.2 cm T1 hypointense, T2 hyperintense, nonenhancing lesion, compatible with a simple cyst. No other suspicious hepatic lesions. No intra or extrahepatic biliary ductal dilatation. Gallbladder is normal in appearance. Pancreas: In the head and proximal body of the pancreas (axial image 36 of series 18 and coronal image 55 of series 20) there is a large lesion that is heterogeneous in T1 and T2 signal intensity, with predominantly T1 hypointensity the non dependently and T1 isointensity dependently with T2 hyperintensity non dependently and T2 hypointensity dependently, but no evidence of internal enhancement, favored to represent a mildly complex pancreatic cyst. Spleen:  Unremarkable. Adrenals/Urinary Tract: Bilateral kidneys and bilateral adrenal glands are normal in appearance. No hydroureteronephrosis in the visualized portions of the abdomen. Stomach/Bowel: Visualized portions are unremarkable. Vascular/Lymphatic: No aneurysm identified in the visualized abdominal vasculature. No lymphadenopathy noted in the abdomen. Other: Small amount of retroperitoneal fluid most evident adjacent to the pancreas. No significant volume of ascites noted in the visualized portions of the peritoneal cavity. Musculoskeletal: No aggressive appearing osseous lesions are noted in the visualized portions of the skeleton. IMPRESSION: 1. 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 This is exerting mass effect locally causing pancreatic ductal dilatation. There are surrounding inflammatory changes adjacent to the pancreas indicative of residual acute pancreatitis. 2. Hepatic steatosis. 3. Trace bilateral pleural effusions lying dependently. Electronically Signed   By: DVinnie LangtonM.D.   On: 02/28/2020 14:20   ECHOCARDIOGRAM COMPLETE  Result Date: 03/01/2020    ECHOCARDIOGRAM REPORT    Patient Name:   Judy HUNSUCKERDate of Exam: 03/01/2020 Medical Rec #:  [id number]        Height:       66.0 in Accession #:    [id number]       Weight:       108.0 lb Date of Birth:  508/21/1957         BSA:          1.539 m Patient Age:    89years  BP:           145/90 mmHg Patient Gender: F                 HR:           77 bpm. Exam Location:  [facility] Procedure: 2D Echo Indications:    Bacteremia 790.7 / R78.81  History:        Patient has no prior history of Echocardiogram examinations.                 Signs/Symptoms:Bacteremia; Risk Factors:Hypertension and                 Non-Smoker. ETOH, Lactic acidosis, Bacteremia due to                 Gram-positive bacteria.  Sonographer:    Leavy Cella RDCS (AE) Referring Phys: [phone] DAVID TAT IMPRESSIONS  1. Left ventricular ejection fraction, by estimation, is 60 to 65%. The left ventricle has normal function. The left ventricle has no regional wall motion abnormalities. Left ventricular diastolic parameters were normal.  2. Right ventricular systolic function is normal. The right ventricular size is normal.  3. The mitral valve is normal in structure. No evidence of mitral valve regurgitation. No evidence of mitral stenosis.  4. The aortic valve is normal in structure. Aortic valve regurgitation is trivial. No aortic stenosis is present.  5. The inferior vena cava is normal in size with greater than 50% respiratory variability, suggesting right atrial pressure of 3 mmHg. FINDINGS  Left Ventricle: Left ventricular ejection fraction, by estimation, is 60 to 65%. The left ventricle has normal function. The left ventricle has no regional wall motion abnormalities. The left ventricular internal cavity size was normal in size. There is  no left ventricular hypertrophy. Left ventricular diastolic parameters were normal. Right Ventricle: The right ventricular size is normal. No increase in right ventricular wall thickness. Right ventricular systolic function  is normal. Left Atrium: Left atrial size was normal in size. Right Atrium: Right atrial size was normal in size. Pericardium: There is no evidence of pericardial effusion. Mitral Valve: The mitral valve is normal in structure. Normal mobility of the mitral valve leaflets. No evidence of mitral valve regurgitation. No evidence of mitral valve stenosis. Tricuspid Valve: The tricuspid valve is normal in structure. Tricuspid valve regurgitation is trivial. No evidence of tricuspid stenosis. Aortic Valve: The aortic valve is normal in structure. Aortic valve regurgitation is trivial. No aortic stenosis is present. Pulmonic Valve: The pulmonic valve was not well visualized. Pulmonic valve regurgitation is trivial. No evidence of pulmonic stenosis. Aorta: The aortic root is normal in size and structure. Venous: The inferior vena cava is normal in size with greater than 50% respiratory variability, suggesting right atrial pressure of 3 mmHg. IAS/Shunts: No atrial level shunt detected by color flow Doppler.  LEFT VENTRICLE PLAX 2D LVIDd:         4.13 cm  Diastology LVIDs:         3.27 cm  LV e' lateral:   6.64 cm/s LV PW:         0.98 cm  LV E/e' lateral: 9.1 LV IVS:        0.82 cm  LV e' medial:    6.09 cm/s LVOT diam:     1.80 cm  LV E/e' medial:  9.9 LVOT Area:     2.54 cm  RIGHT VENTRICLE RV S prime:     11.90 cm/s TAPSE (M-mode):  2.5 cm LEFT ATRIUM             Index       RIGHT ATRIUM           Index LA diam:        3.10 cm 2.01 cm/m  RA Area:     15.80 cm LA Vol (A2C):   53.8 ml 34.95 ml/m RA Volume:   40.00 ml  25.99 ml/m LA Vol (A4C):   41.9 ml 27.22 ml/m LA Biplane Vol: 49.1 ml 31.90 ml/m   AORTA Ao Root diam: 2.80 cm MITRAL VALVE MV Area (PHT): 2.95 cm    SHUNTS MV Decel Time: 257 msec    Systemic Diam: 1.80 cm MV E velocity: 60.10 cm/s MV A velocity: 65.50 cm/s MV E/A ratio:  0.92 Jenkins Rouge MD Electronically signed by Jenkins Rouge MD Signature Date/Time: 03/01/2020/11:48:55 AM    Final    [place] EKG SITE  RITE  Result Date: 03/02/2020 If Site Rite image not attached, placement could not be confirmed due to current cardiac rhythm.   Microbiology: Recent Results (from the past 240 hour(s))  Urine culture     Status: Abnormal   Collection Time: 02/27/20  7:27 AM   Specimen: Urine, Clean Catch  Result Value Ref Range Status   Specimen Description   Final    URINE, CLEAN CATCH Performed at [facility], [address], [place], [place] [address]    Special Requests   Final    NONE Performed at [facility], [address], [place], [place] [address]    Culture >=100,000 COLONIES/mL ESCHERICHIA COLI (A)  Final   Report Status 02/29/2020 FINAL  Final   Organism ID, Bacteria ESCHERICHIA COLI (A)  Final      Susceptibility   Escherichia coli - MIC*    AMPICILLIN 4 SENSITIVE Sensitive     CEFAZOLIN <=4 SENSITIVE Sensitive     CEFTRIAXONE <=0.25 SENSITIVE Sensitive     CIPROFLOXACIN <=0.25 SENSITIVE Sensitive     GENTAMICIN <=1 SENSITIVE Sensitive     IMIPENEM <=0.25 SENSITIVE Sensitive     NITROFURANTOIN <=16 SENSITIVE Sensitive     TRIMETH/SULFA <=20 SENSITIVE Sensitive     AMPICILLIN/SULBACTAM <=2 SENSITIVE Sensitive     PIP/TAZO <=4 SENSITIVE Sensitive     * >=100,000 COLONIES/mL ESCHERICHIA COLI  Culture, blood (Routine X 2) w Reflex to ID Panel     Status: Abnormal   Collection Time: 02/27/20  8:07 AM   Specimen: BLOOD LEFT HAND  Result Value Ref Range Status   Specimen Description   Final    BLOOD LEFT HAND Performed at [facility], [address], [place], [place] [address]    Special Requests   Final    Blood Culture results may not be optimal due to an inadequate volume of blood received in culture bottles BOTTLES DRAWN AEROBIC AND ANAEROBIC Performed at [facility], [address] [place], [place] [address]    Culture  Setup Time   Final    GRAM POSITIVE COCCI IN CHAINS IN BOTH AEROBIC AND ANAEROBIC BOTTLES Gram Stain Report Called to,Read Back By and Verified With:  MURPHY,E 02/28/2020 '@1210'  BY JONES, T Organism ID to follow CRITICAL RESULT CALLED TO, READ BACK BY AND VERIFIED WITH: E MURPHY RN 02/28/20 1900 JDW Performed at [facility], [address] [place], [place] [address]    Culture STREPTOCOCCUS ANGINOSIS (A)  Final   Report Status 03/02/2020 FINAL  Final   Organism ID, Bacteria  STREPTOCOCCUS ANGINOSIS  Final      Susceptibility   Streptococcus anginosis - MIC*    PENICILLIN <=0.06 SENSITIVE Sensitive     CEFTRIAXONE 0.25 SENSITIVE Sensitive     ERYTHROMYCIN <=0.12 SENSITIVE Sensitive     LEVOFLOXACIN <=0.25 SENSITIVE Sensitive     VANCOMYCIN 0.5 SENSITIVE Sensitive     * STREPTOCOCCUS ANGINOSIS  Blood Culture ID Panel (Reflexed)     Status: Abnormal   Collection Time: 02/27/20  8:07 AM  Result Value Ref Range Status   Enterococcus species NOT DETECTED NOT DETECTED Final   Listeria monocytogenes NOT DETECTED NOT DETECTED Final   Staphylococcus species NOT DETECTED NOT DETECTED Final   Staphylococcus aureus (BCID) NOT DETECTED NOT DETECTED Final   Streptococcus species DETECTED (A) NOT DETECTED Final    Comment: Not Enterococcus species, Streptococcus agalactiae, Streptococcus pyogenes, or Streptococcus pneumoniae. CRITICAL RESULT CALLED TO, READ BACK BY AND VERIFIED WITH: E MURPHY RN 02/28/20 1900 JDW    Streptococcus agalactiae NOT DETECTED NOT DETECTED Final   Streptococcus pneumoniae NOT DETECTED NOT DETECTED Final   Streptococcus pyogenes NOT DETECTED NOT DETECTED Final   Acinetobacter baumannii NOT DETECTED NOT DETECTED Final   Enterobacteriaceae species NOT DETECTED NOT DETECTED Final   Enterobacter cloacae complex NOT DETECTED NOT DETECTED Final   Escherichia coli NOT DETECTED NOT DETECTED Final   Klebsiella oxytoca NOT DETECTED NOT DETECTED Final   Klebsiella pneumoniae NOT DETECTED NOT DETECTED Final   Proteus species NOT DETECTED NOT DETECTED Final   Serratia marcescens NOT DETECTED NOT DETECTED Final   Haemophilus  influenzae NOT DETECTED NOT DETECTED Final   Neisseria meningitidis NOT DETECTED NOT DETECTED Final   Pseudomonas aeruginosa NOT DETECTED NOT DETECTED Final   Candida albicans NOT DETECTED NOT DETECTED Final   Candida glabrata NOT DETECTED NOT DETECTED Final   Candida krusei NOT DETECTED NOT DETECTED Final   Candida parapsilosis NOT DETECTED NOT DETECTED Final   Candida tropicalis NOT DETECTED NOT DETECTED Final    Comment: Performed at [facility], [place] [address], [place], [place] [address]  Culture, blood (Routine X 2) w Reflex to ID Panel     Status: Abnormal   Collection Time: 02/27/20  8:18 AM   Specimen: BLOOD RIGHT HAND  Result Value Ref Range Status   Specimen Description   Final    BLOOD RIGHT HAND Performed at [facility], [address] [place], [place] [address]    Special Requests   Final    Blood Culture results may not be optimal due to an inadequate volume of blood received in culture bottles BOTTLES DRAWN AEROBIC ONLY Performed at [facility], [address], [place], [place] [address]    Culture  Setup Time   Final    AEROBIC BOTTLE ONLY GRAM POSITIVE COCCI IN CHAINS Gram Stain Report Called to,Read Back By and Verified With: H TETREAULT,RN '@0517'  02/29/20 [facility] Performed at [facility], [address] [place], [place] [address]    Culture STREPTOCOCCUS ANGINOSIS (A)  Final   Report Status 03/02/2020 FINAL  Final   Organism ID, Bacteria STREPTOCOCCUS ANGINOSIS  Final      Susceptibility   Streptococcus anginosis - MIC*    PENICILLIN <=0.06 SENSITIVE Sensitive     CEFTRIAXONE 0.25 SENSITIVE Sensitive     ERYTHROMYCIN <=0.12 SENSITIVE Sensitive     LEVOFLOXACIN 0.5 SENSITIVE Sensitive     VANCOMYCIN 0.5 SENSITIVE Sensitive     * STREPTOCOCCUS ANGINOSIS  SARS Coronavirus 2 by RT PCR (hospital order,  performed in [facility] lab) Nasopharyngeal     Status: None   Collection Time: 02/27/20  8:24 AM   Specimen: Nasopharyngeal  Result Value Ref  Range Status   SARS Coronavirus 2 NEGATIVE NEGATIVE Final    Comment: (NOTE) SARS-CoV-2 target nucleic acids are NOT DETECTED.  The SARS-CoV-2 RNA is generally detectable in upper and lower respiratory specimens during the acute phase of infection. The lowest concentration of SARS-CoV-2 viral copies this assay can detect is 250 copies / mL. A negative result does not preclude SARS-CoV-2 infection and should not be used as the sole basis for treatment or other patient management decisions.  A negative result may occur with improper specimen collection / handling, submission of specimen other than nasopharyngeal swab, presence of viral mutation(s) within the areas targeted by this assay, and inadequate number of viral copies (<250 copies / mL). A negative result must be combined with clinical observations, patient history, and epidemiological information.  Fact Sheet for Patients:   StrictlyIdeas.no  Fact Sheet for Healthcare Providers: BankingDealers.co.za  This test is not yet approved or  cleared by the [place] FDA and has been authorized for detection and/or diagnosis of SARS-CoV-2 by FDA under an Emergency Use Authorization (EUA).  This EUA will remain in effect (meaning this test can be used) for the duration of the COVID-19 declaration under Section 564(b)(1) of the Act, 21 U.S.C. section 360bbb-3(b)(1), unless the authorization is terminated or revoked sooner.  Performed at [facility], [address], [place], [place] [address]   MRSA PCR Screening     Status: None   Collection Time: 02/27/20  8:24 AM   Specimen: Nasopharyngeal  Result Value Ref Range Status   MRSA by PCR NEGATIVE NEGATIVE Final    Comment:        The GeneXpert MRSA Assay (FDA approved for NASAL specimens only), is one component of a comprehensive MRSA colonization surveillance program. It is not intended to diagnose MRSA infection nor to guide  or monitor treatment for MRSA infections. Performed at [facility], [address] [place], [place] [address]   C Difficile Quick Screen w PCR reflex     Status: None   Collection Time: 02/27/20  3:28 PM   Specimen: STOOL  Result Value Ref Range Status   C Diff antigen NEGATIVE NEGATIVE Final   C Diff toxin NEGATIVE NEGATIVE Final   C Diff interpretation No C. difficile detected.  Final    Comment: Performed at [facility], [address], [place], [place] [address]  Culture, blood (Routine X 2) w Reflex to ID Panel     Status: None (Preliminary result)   Collection Time: 02/29/20  2:13 PM   Specimen: BLOOD RIGHT HAND  Result Value Ref Range Status   Specimen Description BLOOD RIGHT HAND  Final   Special Requests   Final    BOTTLES DRAWN AEROBIC ONLY Blood Culture results may not be optimal due to an inadequate volume of blood received in culture bottles   Culture   Final    NO GROWTH 3 DAYS Performed at [facility], [address], [place], [place] [address]    Report Status PENDING  Incomplete  Culture, blood (Routine X 2) w Reflex to ID Panel     Status: None (Preliminary result)   Collection Time: 02/29/20  2:21 PM   Specimen: BLOOD LEFT HAND  Result Value Ref Range Status   Specimen Description BLOOD LEFT HAND  Final   Special Requests  Final    BOTTLES DRAWN AEROBIC ONLY Blood Culture results may not be optimal due to an inadequate volume of blood received in culture bottles   Culture   Final    NO GROWTH 3 DAYS Performed at [facility], [address], [place], [place] [address]    Report Status PENDING  Incomplete     Labs: Basic Metabolic Panel: Recent Labs  Lab 02/27/20 0447 02/27/20 1515 02/28/20 0406 02/29/20 0438 03/01/20 0457 03/02/20 0602 03/03/20 0654  NA   < >  --  129* 127* 126* 127* 128*  K   < >  --  3.1* 2.7* 3.8 3.7 3.8  CL   < >  --  100 96* 96* 96* 95*  CO2   < >  --  '23 25 23 25 23  ' GLUCOSE   < >  --  80 97 104* 99 121*  BUN   < >  --   8 <5* <5* <5* <5*  CREATININE   < >  --  0.60 0.52 0.48 0.49 0.53  CALCIUM   < >  --  8.1* 8.0* 8.2* 8.4* 8.7*  MG   < > 1.7 1.8 1.7 1.6* 1.6* 1.5*  PHOS  --  3.1  --   --   --   --   --    < > = values in this interval not displayed.   Liver Function Tests: Recent Labs  Lab 02/28/20 0406 02/29/20 0438 03/01/20 0457 03/02/20 0602 03/03/20 0654  AST 32 '19 18 22 24  ' ALT '16 13 11 11 11  ' ALKPHOS 69 61 74 65 69  BILITOT 0.6 0.2* 0.2* 0.3 0.3  PROT 6.0* 5.6* 5.9* 5.9* 6.3*  ALBUMIN 2.3* 2.2* 2.4* 2.4* 2.5*   Recent Labs  Lab 02/27/20 0832 02/28/20 0406 02/29/20 0438  LIPASE 112* 63* 102*   No results for input(s): AMMONIA in the last 168 hours. CBC: Recent Labs  Lab 02/28/20 0406 02/29/20 0438 03/01/20 0457 03/02/20 0602 03/03/20 0654  WBC 19.6* 12.5* 8.1 6.0 5.6  NEUTROABS 17.2* 10.3* 5.5 3.1 2.9  HGB 8.7* 8.8* 8.9* 8.8* 8.7*  HCT 27.4* 26.6* 26.9* 27.4* 27.3*  MCV 101.5* 98.2 97.8 98.9 100.7*  PLT 298 312 330 329 340   Cardiac Enzymes: Recent Labs  Lab 02/27/20 0608  CKTOTAL 26*   BNP: BNP (last 3 results) No results for input(s): BNP in the last 8760 hours.  ProBNP (last 3 results) No results for input(s): PROBNP in the last 8760 hours.  CBG: No results for input(s): GLUCAP in the last 168 hours.     Signed:  Kayleen Memos, MD [facility] 03/04/2020, 11:51 AM

## 2020-03-04 NOTE — Discharge Instructions (Signed)
Bacteremia, Adult Bacteremia is the presence of bacteria in the blood. When bacteria enter the bloodstream, they can cause a life-threatening reaction called sepsis. Sepsis is a medical emergency. What are the causes? This condition is caused by bacteria that get into the blood. Bacteria can enter the blood from an infection, including:  A skin infection or injury, such as a burn or a cut.  A lung infection (pneumonia).  An infection in the stomach or intestines.  An infection in the bladder or urinary system (urinary tract infection).  A bacterial infection in another part of the body that spreads to the blood. Bacteria can also enter the blood during a dental or medical procedure, from bleeding gums, or through use of an unclean needle. What increases the risk? This condition is more likely to develop in children, older adults, and people who have:  A long-term (chronic) disease or condition like diabetes or chronic kidney failure.  An artificial joint or heart valve, or heart valve disease.  A tube inserted to treat a medical condition, such as a urinary catheter or IV.  A weak disease-fighting system (immune system).  Injected illegal drugs.  Been hospitalized for more than 10 days in a row. What are the signs or symptoms? Symptoms of this condition include:  Fever and chills.  Fast heartbeat and shortness of breath.  Dizziness, weakness, and low blood pressure.  Confusion or anxiety.  Pain in the abdomen, nausea, vomiting, and diarrhea. Bacteremia that has spread to other parts of the body may cause symptoms in those areas. In some cases, there are no symptoms. How is this diagnosed? This condition may be diagnosed with a physical exam and tests, such as:  Blood tests to check for bacteria (cultures) or other signs of infection.  Tests of any tubes that you have had inserted. These tests check for a source of infection.  Urine tests to check for bacteria in the  urine.  Imaging tests, such as an X-ray, a CT scan, an MRI, or a heart ultrasound. These check for a source of infection in other parts of your body, such as your lungs, heart valves, or joints. How is this treated? This condition is usually treated in the hospital. If you are treated at home, you may need to return to the hospital for medicines, blood tests, and evaluation. Treatment may include:  Antibiotic medicines. These may be given by mouth or directly into your blood through an IV. You may need antibiotics for several weeks. At first, you may be given an antibiotic to kill most types of blood bacteria. If tests show that a certain kind of bacteria is causing the problem, you may be given a different antibiotic.  IV fluids.  Removing any catheter or device that could be a source of infection.  Blood pressure and breathing support, if needed.  Surgery to control the source or the spread of infection, such as surgery to remove an implanted device, abscess, or infected tissue. Follow these instructions at home: Medicines  Take over-the-counter and prescription medicines only as told by your health care provider.  If you were prescribed an antibiotic medicine, take it as told by your health care provider. Do not stop taking the antibiotic even if you start to feel better. General instructions   Rest as needed. Ask your health care provider when you may return to normal activities.  Drink enough fluid to keep your urine pale yellow.  Do not use any products that contain nicotine or   tobacco, such as cigarettes, e-cigarettes, and chewing tobacco. If you need help quitting, ask your health care provider.  Keep all follow-up visits as told by your health care provider. This is important. How is this prevented?   Wash your hands regularly with soap and water. If soap and water are not available, use hand sanitizer.  You should wash your hands: ? After using the toilet or changing a  diaper. ? Before preparing, cooking, serving, or eating food. ? While caring for a sick person or while visiting someone in a hospital. ? Before and after changing bandages (dressings) over wounds.  Clean any scrapes or cuts with soap and water and cover them with a clean bandage.  Get vaccinations as recommended by your health care provider.  Practice good oral hygiene. Brush your teeth two times a day, and floss regularly.  Take good care of your skin. This includes bathing and moisturizing on a regular basis. Contact a health care provider if:  Your symptoms get worse, and medicines do not help.  You have severe pain. Get help right away if you have:  Pain.  A fever or chills.  Trouble breathing.  A fast heart rate.  Skin that is blotchy, pale, or clammy.  Confusion.  Weakness.  Lack of energy or unusual sleepiness.  New symptoms that develop after treatment has started. These symptoms may represent a serious problem that is an emergency. Do not wait to see if the symptoms will go away. Get medical help right away. Call your local emergency services (911 in the [place] Do not drive yourself to the hospital. Summary  Bacteremia is the presence of bacteria in the blood. When bacteria enter the bloodstream, they can cause a life-threatening reaction called sepsis.  Bacteremia is usually treated with antibiotic medicines in the hospital.  If you were prescribed an antibiotic medicine, take it as told by your health care provider. Do not stop taking the antibiotic even if you start to feel better.  Get help right away if you have any new symptoms that develop after treatment has started. This information is not intended to replace advice given to you by your health care provider. Make sure you discuss any questions you have with your health care provider. Document Revised: 01/21/2019 Document Reviewed: 01/21/2019 Elsevier Patient Education  [place].   Urinary Tract Infection, Adult A urinary tract infection (UTI) is an infection of any part of the urinary tract. The urinary tract includes:  The kidneys.  The ureters.  The bladder.  The urethra. These organs make, store, and get rid of pee (urine) in the body. What are the causes? This is caused by germs (bacteria) in your genital area. These germs grow and cause swelling (inflammation) of your urinary tract. What increases the risk? You are more likely to develop this condition if:  You have a small, thin tube (catheter) to drain pee.  You cannot control when you pee or poop (incontinence).  You are female, and: ? You use these methods to prevent pregnancy:  A medicine that kills sperm (spermicide).  A device that blocks sperm (diaphragm). ? You have low levels of a female hormone (estrogen). ? You are pregnant.  You have genes that add to your risk.  You are sexually active.  You take antibiotic medicines.  You have trouble peeing because of: ? A prostate that is bigger than normal, if you are female. ? A blockage in the part of your body that drains  pee from the bladder (urethra). ? A kidney stone. ? A nerve condition that affects your bladder (neurogenic bladder). ? Not getting enough to drink. ? Not peeing often enough.  You have other conditions, such as: ? Diabetes. ? A weak disease-fighting system (immune system). ? Sickle cell disease. ? Gout. ? Injury of the spine. What are the signs or symptoms? Symptoms of this condition include:  Needing to pee right away (urgently).  Peeing often.  Peeing small amounts often.  Pain or burning when peeing.  Blood in the pee.  Pee that smells bad or not like normal.  Trouble peeing.  Pee that is cloudy.  Fluid coming from the vagina, if you are female.  Pain in the belly or lower back. Other symptoms include:  Throwing up (vomiting).  No urge to eat.  Feeling mixed up  (confused).  Being tired and grouchy (irritable).  A fever.  Watery poop (diarrhea). How is this treated? This condition may be treated with:  Antibiotic medicine.  Other medicines.  Drinking enough water. Follow these instructions at home:  Medicines  Take over-the-counter and prescription medicines only as told by your doctor.  If you were prescribed an antibiotic medicine, take it as told by your doctor. Do not stop taking it even if you start to feel better. General instructions  Make sure you: ? Pee until your bladder is empty. ? Do not hold pee for a long time. ? Empty your bladder after sex. ? Wipe from front to back after pooping if you are a female. Use each tissue one time when you wipe.  Drink enough fluid to keep your pee pale yellow.  Keep all follow-up visits as told by your doctor. This is important. Contact a doctor if:  You do not get better after 1-2 days.  Your symptoms go away and then come back. Get help right away if:  You have very bad back pain.  You have very bad pain in your lower belly.  You have a fever.  You are sick to your stomach (nauseous).  You are throwing up. Summary  A urinary tract infection (UTI) is an infection of any part of the urinary tract.  This condition is caused by germs in your genital area.  There are many risk factors for a UTI. These include having a small, thin tube to drain pee and not being able to control when you pee or poop.  Treatment includes antibiotic medicines for germs.  Drink enough fluid to keep your pee pale yellow. This information is not intended to replace advice given to you by your health care provider. Make sure you discuss any questions you have with your health care provider. Document Revised: 08/19/2018 Document Reviewed: 03/11/2018 Elsevier Patient Education  [place] and Cholesterol Restricted Eating Plan Getting too much fat and cholesterol in your diet may  cause health problems. Choosing the right foods helps keep your fat and cholesterol at normal levels. This can keep you from getting certain diseases. Your doctor may recommend an eating plan that includes:  Total fat: ______% or less of total calories a day.  Saturated fat: ______% or less of total calories a day.  Cholesterol: less than _________mg a day.  Fiber: ______g a day. What are tips for following this plan? Meal planning  At meals, divide your plate into four equal parts: ? Fill one-half of your plate with vegetables and green salads. ? Fill one-fourth of your plate with  whole grains. ? Fill one-fourth of your plate with low-fat (lean) protein foods.  Eat fish that is high in omega-3 fats at least two times a week. This includes mackerel, tuna, sardines, and salmon.  Eat foods that are high in fiber, such as whole grains, beans, apples, broccoli, carrots, peas, and barley. General tips   Work with your doctor to lose weight if you need to.  Avoid: ? Foods with added sugar. ? Fried foods. ? Foods with partially hydrogenated oils.  Limit alcohol intake to no more than 1 drink a day for nonpregnant women and 2 drinks a day for men. One drink equals 12 oz of beer, 5 oz of wine, or 1 oz of hard liquor. Reading food labels  Check food labels for: ? Trans fats. ? Partially hydrogenated oils. ? Saturated fat (g) in each serving. ? Cholesterol (mg) in each serving. ? Fiber (g) in each serving.  Choose foods with healthy fats, such as: ? Monounsaturated fats. ? Polyunsaturated fats. ? Omega-3 fats.  Choose grain products that have whole grains. Look for the word "whole" as the first word in the ingredient list. Cooking  Cook foods using low-fat methods. These include baking, boiling, grilling, and broiling.  Eat more home-cooked foods. Eat at restaurants and buffets less often.  Avoid cooking using saturated fats, such as butter, cream, palm oil, palm kernel  oil, and coconut oil. Recommended foods  Fruits  All fresh, canned (in natural juice), or frozen fruits. Vegetables  Fresh or frozen vegetables (raw, steamed, roasted, or grilled). Green salads. Grains  Whole grains, such as whole wheat or whole grain breads, crackers, cereals, and pasta. Unsweetened oatmeal, bulgur, barley, quinoa, or brown rice. Corn or whole wheat flour tortillas. Meats and other protein foods  Ground beef (85% or leaner), grass-fed beef, or beef trimmed of fat. Skinless chicken or [place]. Ground chicken or [place]. Pork trimmed of fat. All fish and seafood. Egg whites. Dried beans, peas, or lentils. Unsalted nuts or seeds. Unsalted canned beans. Nut butters without added sugar or oil. Dairy  Low-fat or nonfat dairy products, such as skim or 1% milk, 2% or reduced-fat cheeses, low-fat and fat-free ricotta or cottage cheese, or plain low-fat and nonfat yogurt. Fats and oils  Tub margarine without trans fats. Light or reduced-fat mayonnaise and salad dressings. Avocado. Olive, canola, sesame, or safflower oils. The items listed above may not be a complete list of foods and beverages you can eat. Contact a dietitian for more information. Foods to avoid Fruits  Canned fruit in heavy syrup. Fruit in cream or butter sauce. Fried fruit. Vegetables  Vegetables cooked in cheese, cream, or butter sauce. Fried vegetables. Grains  White bread. White pasta. White rice. Cornbread. Bagels, pastries, and croissants. Crackers and snack foods that contain trans fat and hydrogenated oils. Meats and other protein foods  Fatty cuts of meat. Ribs, chicken wings, bacon, sausage, bologna, salami, chitterlings, fatback, hot dogs, bratwurst, and packaged lunch meats. Liver and organ meats. Whole eggs and egg yolks. Chicken and [place] with skin. Fried meat. Dairy  Whole or 2% milk, cream, half-and-half, and cream cheese. Whole milk cheeses. Whole-fat or sweetened yogurt. Full-fat  cheeses. Nondairy creamers and whipped toppings. Processed cheese, cheese spreads, and cheese curds. Beverages  Alcohol. Sugar-sweetened drinks such as sodas, lemonade, and fruit drinks. Fats and oils  Butter, stick margarine, lard, shortening, ghee, or bacon fat. Coconut, palm kernel, and palm oils. Sweets and desserts  Corn syrup, sugars, honey, and molasses. Candy.  Jam and jelly. Syrup. Sweetened cereals. Cookies, pies, cakes, donuts, muffins, and ice cream. The items listed above may not be a complete list of foods and beverages you should avoid. Contact a dietitian for more information. Summary  Choosing the right foods helps keep your fat and cholesterol at normal levels. This can keep you from getting certain diseases.  At meals, fill one-half of your plate with vegetables and green salads.  Eat high-fiber foods, like whole grains, beans, apples, carrots, peas, and barley.  Limit added sugar, saturated fats, alcohol, and fried foods. This information is not intended to replace advice given to you by your health care provider. Make sure you discuss any questions you have with your health care provider. Document Revised: 05/05/2018 Document Reviewed: 05/19/2017 Elsevier Patient Education  [place].  Pancreatitis Eating Plan Pancreatitis is when your pancreas becomes irritated and swollen (inflamed). The pancreas is a small organ located behind your stomach. It helps your body digest food and regulate your blood sugar. Pancreatitis can affect how your body digests food, especially foods with fat. You may also have other symptoms such as abdominal pain or nausea. When you have pancreatitis, following a low-fat eating plan may help you manage symptoms and recover more quickly. Work with your health care provider or a diet and nutrition specialist (dietitian) to create an eating plan that is right for you. What are tips for following this plan? Reading food labels Use the  information on food labels to help keep track of how much fat you eat:  Check the serving size.  Look for the amount of total fat in grams (g) in one serving. ? Low-fat foods have 3 g of fat or less per serving. ? Fat-free foods have 0.5 g of fat or less per serving.  Keep track of how much fat you eat based on how many servings you eat. ? For example, if you eat two servings, the amount of fat you eat will be two times what is listed on the label. Shopping   Buy low-fat or nonfat foods, such as: ? Fresh, frozen, or canned fruits and vegetables. ? Grains, including pasta, bread, and rice. ? Lean meat, poultry, fish, and other protein foods. ? Low-fat or nonfat dairy.  Avoid buying bakery products and other sweets made with whole milk, butter, and eggs.  Avoid buying snack foods with added fat, such as anything with butter or cheese flavoring. Cooking  Remove skin from poultry, and remove extra fat from meat.  Limit the amount of fat and oil you use to 6 teaspoons or less per day.  Cook using low-fat methods, such as boiling, broiling, grilling, steaming, or baking.  Use spray oil to cook. Add fat-free chicken broth to add flavor and moisture.  Avoid adding cream to thicken soups or sauces. Use other thickeners such as corn starch or tomato paste. Meal planning   Eat a low-fat diet as told by your dietitian. For most people, this means having no more than 55-65 grams of fat each day.  Eat small, frequent meals throughout the day. For example, you may have 5-6 small meals instead of 3 large meals.  Drink enough fluid to keep your urine pale yellow.  Do not drink alcohol. Talk to your health care provider if you need help stopping.  Limit how much caffeine you have, including black coffee, black and green tea, caffeinated soft drinks, and energy drinks. General information  Let your health care provider or dietitian know  if you have unplanned weight loss on this eating  plan.  You may be instructed to follow a clear liquid diet during a flare of symptoms. Talk with your health care provider about how to manage your diet during symptoms of a flare.  Take any vitamins or supplements as told by your health care provider.  Work with a Microbiologist, especially if you have other conditions such as obesity or diabetes mellitus. What foods should I avoid? Fruits Fried fruits. Fruits served with butter or cream. Vegetables Fried vegetables. Vegetables cooked with butter, cheese, or cream. Grains Biscuits, waffles, donuts, pastries, and croissants. Pies and cookies. Butter-flavored popcorn. Regular crackers. Meats and other protein foods Fatty cuts of meat. Poultry with skin. Organ meats. Bacon, sausage, and cold cuts. Whole eggs. Nuts and nut butters. Dairy Whole and 2% milk. Whole milk yogurt. Whole milk ice cream. Cream and half-and-half. Cream cheese. Sour cream. Cheese. Beverages Wine, beer, and liquor. The items listed above may not be a complete list of foods and beverages to avoid. Contact a dietitian for more information. Summary  Pancreatitis can affect how your body digests food, especially foods with fat.  When you have pancreatitis, it is recommended that you follow a low-fat eating plan to help you recover more quickly and manage symptoms. For most people, this means limiting fat to no more than 55-65 grams per day.  Do not drink alcohol. Limit the amount of caffeine you have, and drink enough fluid to keep your urine pale yellow. This information is not intended to replace advice given to you by your health care provider. Make sure you discuss any questions you have with your health care provider. Document Revised: 12/23/2018 Document Reviewed: 12/08/2017 Elsevier Patient Education  [place].

## 2020-03-04 NOTE — Progress Notes (Signed)
IV removed, 2x2 gauze and paper tape applied to site, patient tolerated well. Reviewed AVS with patient who verbalized understanding.  Patient given prescription of IV Rocephin to give to [facility], who will see patient tomorrow to give antibiotic.  Patient taken to front lobby via wheelchair and transported home by a friend.

## 2020-03-05 ENCOUNTER — Encounter: Payer: Self-pay | Admitting: Internal Medicine

## 2020-03-05 LAB — CULTURE, BLOOD (ROUTINE X 2)
Culture: NO GROWTH
Culture: NO GROWTH

## 2020-03-05 NOTE — Telephone Encounter (Signed)
PATIENT SCHEDULED  °

## 2020-03-06 ENCOUNTER — Other Ambulatory Visit: Payer: Self-pay

## 2020-03-06 ENCOUNTER — Encounter (HOSPITAL_COMMUNITY)
Admission: RE | Admit: 2020-03-06 | Discharge: 2020-03-06 | Disposition: A | Discharge status: Home / Self Care | Payer: Medicare Other | Source: Ambulatory Visit | Attending: Internal Medicine | Admitting: Internal Medicine

## 2020-03-06 DIAGNOSIS — R7881 Bacteremia: Secondary | ICD-10-CM | POA: Diagnosis not present

## 2020-03-06 DIAGNOSIS — N179 Acute kidney failure, unspecified: Secondary | ICD-10-CM | POA: Diagnosis not present

## 2020-03-06 DIAGNOSIS — A409 Streptococcal sepsis, unspecified: Secondary | ICD-10-CM | POA: Insufficient documentation

## 2020-03-06 MED ORDER — HEPARIN SOD (PORK) LOCK FLUSH 100 UNIT/ML IV SOLN
250.0000 [IU] | INTRAVENOUS | Status: AC | PRN
  Administered 2020-03-06: 250 [IU]

## 2020-03-06 MED ORDER — SODIUM CHLORIDE 0.9 % IV SOLN
2.0000 g | Freq: Once | INTRAVENOUS | Status: AC
  Administered 2020-03-06: 2 g via INTRAVENOUS

## 2020-03-06 MED ORDER — SODIUM CHLORIDE 0.9% FLUSH
10.0000 mL | INTRAVENOUS | Status: AC | PRN
  Administered 2020-03-06: 10 mL

## 2020-03-06 MED ORDER — SODIUM CHLORIDE 0.9 % IV SOLN
INTRAVENOUS | Status: AC
  Filled 2020-03-06: qty 20

## 2020-03-06 MED ORDER — SODIUM CHLORIDE FLUSH 0.9 % IV SOLN
INTRAVENOUS | Status: AC
  Filled 2020-03-06: qty 20

## 2020-03-06 MED ORDER — HEPARIN SOD (PORK) LOCK FLUSH 100 UNIT/ML IV SOLN
INTRAVENOUS | Status: AC
  Filled 2020-03-06: qty 5

## 2020-03-07 ENCOUNTER — Encounter (HOSPITAL_COMMUNITY): Payer: Medicare Other

## 2020-03-07 ENCOUNTER — Encounter (HOSPITAL_COMMUNITY)
Admission: RE | Admit: 2020-03-07 | Discharge: 2020-03-07 | Disposition: A | Discharge status: Home / Self Care | Payer: Medicare Other | Source: Ambulatory Visit | Attending: Internal Medicine | Admitting: Internal Medicine

## 2020-03-07 DIAGNOSIS — A409 Streptococcal sepsis, unspecified: Secondary | ICD-10-CM | POA: Diagnosis not present

## 2020-03-07 DIAGNOSIS — R7881 Bacteremia: Secondary | ICD-10-CM | POA: Diagnosis not present

## 2020-03-07 MED ORDER — HEPARIN SOD (PORK) LOCK FLUSH 100 UNIT/ML IV SOLN: 250.0000 [IU] | INTRAVENOUS | Status: DC | PRN

## 2020-03-07 MED ORDER — SODIUM CHLORIDE 0.9% FLUSH
10.0000 mL | INTRAVENOUS | Status: AC | PRN
  Administered 2020-03-07: 10 mL

## 2020-03-07 MED ORDER — SODIUM CHLORIDE 0.9% FLUSH: 10.0000 mL | INTRAVENOUS | Status: DC | PRN

## 2020-03-07 MED ORDER — SODIUM CHLORIDE 0.9 % IV SOLN
INTRAVENOUS | Status: AC
  Filled 2020-03-07: qty 20

## 2020-03-07 MED ORDER — SODIUM CHLORIDE 0.9 % IV SOLN
2.0000 g | Freq: Once | INTRAVENOUS | Status: AC
  Administered 2020-03-07: 2 g via INTRAVENOUS

## 2020-03-07 MED ORDER — HEPARIN SOD (PORK) LOCK FLUSH 100 UNIT/ML IV SOLN
250.0000 [IU] | INTRAVENOUS | Status: AC | PRN
  Administered 2020-03-07: 250 [IU]
  Filled 2020-03-07: qty 5

## 2020-03-07 NOTE — Progress Notes (Signed)
Talked with Tiffany RN-AC. Notified pt has appts this weekend, (6/26, 6/27) for rocephin infusions. Arm band with stickers and copy of order taken to ER and given to Francis at registration.

## 2020-03-08 ENCOUNTER — Encounter (HOSPITAL_COMMUNITY): Payer: Medicare Other

## 2020-03-08 ENCOUNTER — Other Ambulatory Visit: Payer: Self-pay

## 2020-03-08 ENCOUNTER — Encounter (HOSPITAL_COMMUNITY)
Admission: RE | Admit: 2020-03-08 | Discharge: 2020-03-08 | Disposition: A | Discharge status: Home / Self Care | Payer: Medicare Other | Source: Ambulatory Visit | Attending: Internal Medicine | Admitting: Internal Medicine

## 2020-03-08 DIAGNOSIS — A409 Streptococcal sepsis, unspecified: Secondary | ICD-10-CM | POA: Diagnosis not present

## 2020-03-08 DIAGNOSIS — R7881 Bacteremia: Secondary | ICD-10-CM | POA: Diagnosis not present

## 2020-03-08 MED ORDER — HEPARIN SOD (PORK) LOCK FLUSH 100 UNIT/ML IV SOLN
250.0000 [IU] | INTRAVENOUS | Status: AC | PRN
  Administered 2020-03-08: 250 [IU]

## 2020-03-08 MED ORDER — SODIUM CHLORIDE FLUSH 0.9 % IV SOLN
INTRAVENOUS | Status: AC
  Filled 2020-03-08: qty 20

## 2020-03-08 MED ORDER — SODIUM CHLORIDE 0.9 % IV SOLN: 2.0000 g | Freq: Once | INTRAVENOUS | Status: DC

## 2020-03-08 MED ORDER — SODIUM CHLORIDE 0.9% FLUSH
10.0000 mL | INTRAVENOUS | Status: AC | PRN
  Administered 2020-03-08: 10 mL

## 2020-03-08 MED ORDER — SODIUM CHLORIDE 0.9 % IV SOLN
INTRAVENOUS | Status: AC
  Filled 2020-03-08: qty 20

## 2020-03-08 MED ORDER — SODIUM CHLORIDE 0.9 % IV SOLN
2.0000 g | Freq: Once | INTRAVENOUS | Status: AC
  Administered 2020-03-08: 2 g via INTRAVENOUS

## 2020-03-08 MED ORDER — HEPARIN SOD (PORK) LOCK FLUSH 100 UNIT/ML IV SOLN
INTRAVENOUS | Status: AC
  Filled 2020-03-08: qty 5

## 2020-03-09 ENCOUNTER — Encounter (HOSPITAL_COMMUNITY)
Admission: RE | Admit: 2020-03-09 | Discharge: 2020-03-09 | Disposition: A | Discharge status: Home / Self Care | Payer: Medicare Other | Source: Ambulatory Visit | Attending: Internal Medicine | Admitting: Internal Medicine

## 2020-03-09 ENCOUNTER — Encounter (HOSPITAL_COMMUNITY): Payer: Medicare Other

## 2020-03-09 DIAGNOSIS — R7881 Bacteremia: Secondary | ICD-10-CM | POA: Diagnosis not present

## 2020-03-09 DIAGNOSIS — A409 Streptococcal sepsis, unspecified: Secondary | ICD-10-CM | POA: Diagnosis not present

## 2020-03-09 MED ORDER — HEPARIN SOD (PORK) LOCK FLUSH 100 UNIT/ML IV SOLN
INTRAVENOUS | Status: AC
  Filled 2020-03-09: qty 5

## 2020-03-09 MED ORDER — SODIUM CHLORIDE 0.9 % IV SOLN
INTRAVENOUS | Status: AC
  Filled 2020-03-09: qty 20

## 2020-03-09 MED ORDER — SODIUM CHLORIDE 0.9 % IV SOLN
2.0000 g | Freq: Once | INTRAVENOUS | Status: AC
  Administered 2020-03-09: 2 g via INTRAVENOUS

## 2020-03-09 MED ORDER — HEPARIN SOD (PORK) LOCK FLUSH 100 UNIT/ML IV SOLN
250.0000 [IU] | INTRAVENOUS | Status: AC | PRN
  Administered 2020-03-09: 250 [IU]

## 2020-03-09 MED ORDER — SODIUM CHLORIDE 0.9% FLUSH
10.0000 mL | INTRAVENOUS | Status: AC | PRN
  Administered 2020-03-09: 10 mL

## 2020-03-10 ENCOUNTER — Encounter (HOSPITAL_COMMUNITY)
Admission: RE | Admit: 2020-03-10 | Discharge: 2020-03-10 | Disposition: A | Discharge status: Home / Self Care | Payer: Medicare Other | Source: Ambulatory Visit | Attending: Internal Medicine | Admitting: Internal Medicine

## 2020-03-10 ENCOUNTER — Encounter (HOSPITAL_COMMUNITY): Payer: Medicare Other

## 2020-03-10 DIAGNOSIS — A409 Streptococcal sepsis, unspecified: Secondary | ICD-10-CM | POA: Diagnosis not present

## 2020-03-10 DIAGNOSIS — R7881 Bacteremia: Secondary | ICD-10-CM | POA: Diagnosis not present

## 2020-03-10 MED ORDER — SODIUM CHLORIDE 0.9% FLUSH
10.0000 mL | INTRAVENOUS | Status: AC | PRN
  Administered 2020-03-10: 10 mL

## 2020-03-10 MED ORDER — SODIUM CHLORIDE 0.9 % IV SOLN
2.0000 g | Freq: Once | INTRAVENOUS | Status: AC
  Administered 2020-03-10: 2 g via INTRAVENOUS

## 2020-03-10 MED ORDER — HEPARIN SOD (PORK) LOCK FLUSH 100 UNIT/ML IV SOLN
250.0000 [IU] | INTRAVENOUS | Status: AC | PRN
  Administered 2020-03-10: 250 [IU]

## 2020-03-11 ENCOUNTER — Encounter (HOSPITAL_COMMUNITY): Payer: Medicare Other

## 2020-03-11 ENCOUNTER — Encounter (HOSPITAL_COMMUNITY)
Admission: RE | Admit: 2020-03-11 | Discharge: 2020-03-11 | Disposition: A | Discharge status: Home / Self Care | Payer: Medicare Other | Source: Ambulatory Visit | Attending: Internal Medicine | Admitting: Internal Medicine

## 2020-03-11 DIAGNOSIS — A409 Streptococcal sepsis, unspecified: Secondary | ICD-10-CM | POA: Diagnosis not present

## 2020-03-11 DIAGNOSIS — R7881 Bacteremia: Secondary | ICD-10-CM | POA: Diagnosis not present

## 2020-03-11 MED ORDER — SODIUM CHLORIDE 0.9% FLUSH
10.0000 mL | INTRAVENOUS | Status: AC | PRN
  Administered 2020-03-11: 10 mL

## 2020-03-11 MED ORDER — SODIUM CHLORIDE 0.9 % IV SOLN
2.0000 g | Freq: Once | INTRAVENOUS | Status: AC
  Administered 2020-03-11: 2 g via INTRAVENOUS

## 2020-03-11 MED ORDER — HEPARIN SOD (PORK) LOCK FLUSH 100 UNIT/ML IV SOLN
250.0000 [IU] | INTRAVENOUS | Status: AC | PRN
  Administered 2020-03-11: 250 [IU]

## 2020-03-12 ENCOUNTER — Encounter (HOSPITAL_COMMUNITY)
Admission: RE | Admit: 2020-03-12 | Discharge: 2020-03-12 | Disposition: A | Discharge status: Home / Self Care | Payer: Medicare Other | Source: Ambulatory Visit | Attending: Internal Medicine | Admitting: Internal Medicine

## 2020-03-12 ENCOUNTER — Encounter (HOSPITAL_COMMUNITY): Payer: Self-pay

## 2020-03-12 ENCOUNTER — Other Ambulatory Visit: Payer: Self-pay

## 2020-03-12 ENCOUNTER — Encounter (HOSPITAL_COMMUNITY): Payer: Medicare Other

## 2020-03-12 DIAGNOSIS — R7881 Bacteremia: Secondary | ICD-10-CM | POA: Diagnosis not present

## 2020-03-12 DIAGNOSIS — A409 Streptococcal sepsis, unspecified: Secondary | ICD-10-CM | POA: Diagnosis not present

## 2020-03-12 MED ORDER — SODIUM CHLORIDE 0.9 % IV SOLN
2.0000 g | Freq: Once | INTRAVENOUS | Status: AC
  Administered 2020-03-12: 2 g via INTRAVENOUS

## 2020-03-12 MED ORDER — HEPARIN SOD (PORK) LOCK FLUSH 100 UNIT/ML IV SOLN
250.0000 [IU] | INTRAVENOUS | Status: AC | PRN
  Administered 2020-03-12: 250 [IU]

## 2020-03-12 MED ORDER — SODIUM CHLORIDE 0.9 % IV SOLN
INTRAVENOUS | Status: AC
  Filled 2020-03-12: qty 20

## 2020-03-12 MED ORDER — SODIUM CHLORIDE 0.9% FLUSH: 10.0000 mL | INTRAVENOUS | Status: DC | PRN

## 2020-03-13 ENCOUNTER — Encounter (HOSPITAL_COMMUNITY): Payer: Self-pay

## 2020-03-13 ENCOUNTER — Encounter (HOSPITAL_COMMUNITY)
Admission: RE | Admit: 2020-03-13 | Discharge: 2020-03-13 | Disposition: A | Discharge status: Home / Self Care | Payer: Medicare Other | Source: Ambulatory Visit | Attending: Internal Medicine | Admitting: Internal Medicine

## 2020-03-13 ENCOUNTER — Encounter (HOSPITAL_COMMUNITY): Payer: Medicare Other

## 2020-03-13 DIAGNOSIS — A409 Streptococcal sepsis, unspecified: Secondary | ICD-10-CM | POA: Diagnosis not present

## 2020-03-13 DIAGNOSIS — R7881 Bacteremia: Secondary | ICD-10-CM | POA: Diagnosis not present

## 2020-03-13 MED ORDER — SODIUM CHLORIDE 0.9% FLUSH: 10.0000 mL | INTRAVENOUS | Status: DC | PRN

## 2020-03-13 MED ORDER — HEPARIN SOD (PORK) LOCK FLUSH 100 UNIT/ML IV SOLN
250.0000 [IU] | INTRAVENOUS | Status: AC | PRN
  Administered 2020-03-13: 250 [IU]

## 2020-03-13 MED ORDER — SODIUM CHLORIDE 0.9 % IV SOLN: Freq: Once | INTRAVENOUS | Status: AC

## 2020-03-13 MED ORDER — SODIUM CHLORIDE 0.9 % IV SOLN
2.0000 g | Freq: Once | INTRAVENOUS | Status: AC
  Administered 2020-03-13: 2 g via INTRAVENOUS

## 2020-03-14 ENCOUNTER — Other Ambulatory Visit: Payer: Self-pay

## 2020-03-14 ENCOUNTER — Encounter (HOSPITAL_COMMUNITY): Payer: Self-pay

## 2020-03-14 ENCOUNTER — Encounter (HOSPITAL_COMMUNITY): Payer: Medicare Other

## 2020-03-14 ENCOUNTER — Encounter (HOSPITAL_COMMUNITY)
Admission: RE | Admit: 2020-03-14 | Discharge: 2020-03-14 | Disposition: A | Discharge status: Home / Self Care | Payer: Medicare Other | Source: Ambulatory Visit | Attending: Internal Medicine | Admitting: Internal Medicine

## 2020-03-14 DIAGNOSIS — A409 Streptococcal sepsis, unspecified: Secondary | ICD-10-CM | POA: Diagnosis not present

## 2020-03-14 DIAGNOSIS — R7881 Bacteremia: Secondary | ICD-10-CM | POA: Diagnosis not present

## 2020-03-14 MED ORDER — SODIUM CHLORIDE 0.9 % IV SOLN
2.0000 g | Freq: Once | INTRAVENOUS | Status: AC
  Administered 2020-03-14: 2 g via INTRAVENOUS

## 2020-03-14 MED ORDER — SODIUM CHLORIDE 0.9% FLUSH: 10.0000 mL | INTRAVENOUS | Status: DC | PRN

## 2020-03-14 MED ORDER — HEPARIN SOD (PORK) LOCK FLUSH 100 UNIT/ML IV SOLN: 250.0000 [IU] | INTRAVENOUS | Status: DC | PRN

## 2020-03-14 MED ORDER — SODIUM CHLORIDE 0.9 % IV SOLN: Freq: Once | INTRAVENOUS | Status: AC

## 2020-04-24 ENCOUNTER — Ambulatory Visit: Payer: Medicare Other | Admitting: Gastroenterology

## 2020-04-24 NOTE — Progress Notes (Deleted)
Primary Care Physician: [facility]  Primary Gastroenterologist:  Elon Alas. Abbey Chatters, DO   No chief complaint on file.   HPI: Judy Davis is a 64 y.o. female here for hospital follow-up.  She has a history of alcohol abuse, abdominal pain, poor oral intake requiring admission back in June.  CT with acute on chronic pancreatitis with mildly complex pseudocyst.  She had sepsis with UTI (E. coli) and bacteremia (Streptococcus species).  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  CA 19-9 was elevated at 74.  Outpatient EUS recommended.  Also history of anemia dating back for years, no prior colonoscopy.  EGD November 2020 with LA grade C esophagitis, gastritis, no H. pylori.Marland Kitchen  Anemia felt to be multifactorial in the setting of malnutrition, alcohol abuse.  Current Outpatient Medications  Medication Sig Dispense Refill  . folic acid (FOLVITE) 1 MG tablet Take 1 tablet (1 mg total) by mouth daily. 60 tablet 0  . Multiple Vitamin (MULTIVITAMIN WITH MINERALS) TABS tablet Take 1 tablet by mouth daily. 60 tablet 0  . pantoprazole (PROTONIX) 40 MG tablet Take 1 tablet (40 mg total) by mouth daily. 60 tablet 0  . thiamine 100 MG tablet Take 1 tablet (100 mg total) by mouth daily. 60 tablet 0  . Vitamin D, Ergocalciferol, (DRISDOL) 1.25 MG (50000 UNIT) CAPS capsule Take 1 capsule (50,000 Units total) by mouth every 7 (seven) days. 5 capsule 1   No current facility-administered medications for this visit.    Allergies as of 04/24/2020  . (No Known Allergies)    ROS:  General: Negative for anorexia, weight loss, fever, chills, fatigue, weakness. ENT: Negative for hoarseness, difficulty swallowing , nasal congestion. CV: Negative for chest pain, angina, palpitations, dyspnea on exertion, peripheral edema.  Respiratory: Negative for dyspnea at rest, dyspnea  on exertion, cough, sputum, wheezing.  GI: See history of present illness. GU:  Negative for dysuria, hematuria, urinary incontinence, urinary frequency, nocturnal urination.  Endo: Negative for unusual weight change.    Physical Examination:   There were no vitals taken for this visit.  General: Well-nourished, well-developed in no acute distress.  Eyes: No icterus. Mouth: Oropharyngeal mucosa moist and pink , no lesions erythema or exudate. Lungs: Clear to auscultation bilaterally.  Heart: Regular rate and rhythm, no murmurs rubs or gallops.  Abdomen: Bowel sounds are normal, nontender, nondistended, no hepatosplenomegaly or masses, no abdominal bruits or hernia , no rebound or guarding.   Extremities: No lower extremity edema. No clubbing or deformities. Neuro: Alert and oriented x 4   Skin: Warm and dry, no jaundice.   Psych: Alert and cooperative, normal mood and affect.  Labs:  Lab Results  Component Value Date   WBC 5.6 03/03/2020   HGB 8.7 (L) 03/03/2020   HCT 27.3 (L) 03/03/2020   MCV 100.7 (H) 03/03/2020   PLT 340 03/03/2020   Lab Results  Component Value Date   CREATININE 0.53 03/03/2020   BUN <5 (L) 03/03/2020   NA 128 (L) 03/03/2020   K 3.8 03/03/2020   CL 95 (L) 03/03/2020   CO2 23 03/03/2020   Lab Results  Component Value Date   LIPASE 102 (H) 02/29/2020   Lab Results  Component Value Date   ALT 11 03/03/2020   AST 24 03/03/2020   ALKPHOS 69 03/03/2020   BILITOT 0.3 03/03/2020   Lab Results  Component Value Date  IRON 33 03/01/2020   TIBC 160 (L) 03/01/2020   FERRITIN 435 (H) 03/01/2020   Lab Results  Component Value Date   VITAMINB12 315 03/01/2020   Lab Results  Component Value Date   FOLATE 9.6 03/01/2020     Imaging Studies: No results found.

## 2020-05-24 ENCOUNTER — Telehealth: Payer: Self-pay | Admitting: Gastroenterology

## 2020-05-24 ENCOUNTER — Encounter: Payer: Self-pay | Admitting: Gastroenterology

## 2020-05-24 NOTE — Telephone Encounter (Signed)
SENT LETTER TO PATIENT STATING SHE NEEDED TO CALL AND MAKE APPOINTMENT

## 2020-05-24 NOTE — Telephone Encounter (Signed)
Patient needs ov for possible colonoscopy and EUS. She no showed last ov.

## 2020-07-18 ENCOUNTER — Encounter: Payer: Self-pay | Admitting: Gastroenterology

## 2020-07-18 ENCOUNTER — Other Ambulatory Visit: Payer: Self-pay

## 2020-07-18 ENCOUNTER — Ambulatory Visit (INDEPENDENT_AMBULATORY_CARE_PROVIDER_SITE_OTHER): Payer: Medicare Other | Admitting: Gastroenterology

## 2020-07-18 VITALS — BP 160/80 | HR 81 | Temp 96.6°F | Ht 66.0 in | Wt 125.4 lb

## 2020-07-18 DIAGNOSIS — K8689 Other specified diseases of pancreas: Secondary | ICD-10-CM | POA: Diagnosis not present

## 2020-07-18 DIAGNOSIS — D649 Anemia, unspecified: Secondary | ICD-10-CM | POA: Diagnosis not present

## 2020-07-18 DIAGNOSIS — R978 Other abnormal tumor markers: Secondary | ICD-10-CM | POA: Diagnosis not present

## 2020-07-18 NOTE — Progress Notes (Signed)
Primary Care Physician: Health, [facility]  Primary Gastroenterologist:  Elon Alas. Abbey Chatters, DO   Chief Complaint  Patient presents with  . pancreatic cyst    HPI: [name] is a 64 y.o. female here for follow-up. She was seen back in the hospital in June 2021 for pancreatic cyst. She presented via EMS for diminished oral intake over several days, abdominal pain. Lipase 112, LFTs normal. CT abdomen pelvis with acute on chronic pancreatitis, ill-defined mass in the proximal body of the pancreas measuring 3.2 x 3.2 cm suspected mildly complicated pseudocyst. No pancreatic or CBD ductal dilation. 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 This is exerting mass-effect locally causing pancreatic ductal dilatation. Evidence of residual acute pancreatitis. CA 19-9 elevated at 74.  EGD November 2020 with LA grade C esophagitis, gastritis, no H. pylori. No prior colonoscopy.  Father had pancreatic cancer. Patient with daily alcohol consumption.  Has gained weight since in the hospital. Eating well, two full meals daily and a sandwich. No abdominal pain or n/v. BM regular. No melena, brbpr. Drink beer once per week, total of one beer per week (12 ounces). She is not clear on what medications she is taking. She believes she is on taking more medication. At time of discharge in June she was given pancreatic enzymes, pantoprazole, folic acid, thiamine, vitamin D.  Will consent for colonoscopy and any other testing we recommend.  Patient states currently using [facility] in [place].  Current Outpatient Medications  Medication Sig Dispense Refill  . pantoprazole (PROTONIX) 40 MG tablet Take 1 tablet (40 mg total) by mouth daily. 60 tablet 0   No current facility-administered medications for this visit.    Allergies as of 07/18/2020  . (No Known Allergies)    ROS:  General:  Negative for anorexia, weight loss, fever, chills, fatigue, weakness. ENT: Negative for hoarseness, difficulty swallowing , nasal congestion. CV: Negative for chest pain, angina, palpitations, dyspnea on exertion, peripheral edema.  Respiratory: Negative for dyspnea at rest, dyspnea on exertion, cough, sputum, wheezing.  GI: See history of present illness. GU:  Negative for dysuria, hematuria, urinary incontinence, urinary frequency, nocturnal urination.  Endo: Negative for unusual weight change.    Physical Examination:   BP (!) 160/80 (BP Location: Left Arm, Cuff Size: Normal)   Pulse 81   Temp (!) 96.6 F (35.9 C) (Temporal)   Ht 5\' 6"  (1.676 m)   Wt 125 lb 6.4 oz (56.9 kg)   BMI 20.24 kg/m   General: Well-nourished, well-developed in no acute distress.  Eyes: No icterus. Mouth: masked Lungs: Clear to auscultation bilaterally.  Heart: Regular rate and rhythm, no murmurs rubs or gallops.  Abdomen: Bowel sounds are normal, nontender, nondistended, no hepatosplenomegaly or masses, no abdominal bruits or hernia , no rebound or guarding.   Extremities: No lower extremity edema. No clubbing or deformities. Neuro: Alert and oriented x 4   Skin: Warm and dry, no jaundice.   Psych: Alert and cooperative, normal mood and affect.  Labs:  Lab Results  Component Value Date   CREATININE 0.53 03/03/2020   BUN <5 (L) 03/03/2020   NA 128 (L) 03/03/2020   K 3.8 03/03/2020   CL 95 (L) 03/03/2020   CO2 23 03/03/2020   Lab Results  Component Value Date   ALT 11 03/03/2020   AST 24 03/03/2020   ALKPHOS 69 03/03/2020   BILITOT  0.3 03/03/2020   Lab Results  Component Value Date   WBC 5.6 03/03/2020   HGB 8.7 (L) 03/03/2020   HCT 27.3 (L) 03/03/2020   MCV 100.7 (H) 03/03/2020   PLT 340 03/03/2020   Lab Results  Component Value Date   IRON 33 03/01/2020   TIBC 160 (L) 03/01/2020   FERRITIN 435 (H) 03/01/2020   Lab Results  Component Value Date   VITAMINB12 315 03/01/2020    Lab Results  Component Value Date   FOLATE 9.6 03/01/2020    CA19-9 74H  Imaging Studies: No results found.   Impression/Plan:  Pleasant 64 year old female presenting for follow-up of pancreatic mass, anemia. Presented to the hospital back in June with poor oral intake, abdominal pain in the setting of alcohol abuse. Noted to have sodium of 122, creatinine 1.47, lipase 122. CT with acute on chronic pancreatitis with mildly complex pseudocyst. MRI/MRCP with lesion of concern in the head and proximal body of pancreas most consistent with large pancreatic pseudocyst with some local mass-effect causing pancreatic ductal dilation. Residual acute pancreatitis noted. CA 19-9 was elevated at 74.   She also had anemia dating back few years. No overt gi bleeding. EGD 07/2019: LA grade C esophagitis. B12, iron/iron sat/folate normal. Ferritin elevated. No prior colonoscopy. Likely anemia of chronic disease and poor nutrition. Recheck today. Will eventually need colonoscopy pending work up of pancreatic mass.   For pancreatic mass, will follow up CA 19-9. She may require EUS as next step. Currently without symptoms of chronic pancreatitis and not on pancreatic enzymes. Medication list unclear, patient to call back with medication and we will touch base with [facility] [address] to see what they have listed.   ETOH abuse: doing better. Drinking 12 ounces of beer per week. Encouraged etoh cessation.   HTN: encouraged her to follow up with PCP as it appears she is no longer on medication

## 2020-07-18 NOTE — Patient Instructions (Addendum)
1. Please go for labs at [facility]. We will contact you with results as available.  2. Depending on results you may need further imaging of your pancreas and a colonoscopy.  3. Please call us with the name of your medication. Your blood pressure is very high today and you need to see your family doctor to follow up on this.

## 2020-07-19 ENCOUNTER — Telehealth: Payer: Self-pay | Admitting: Internal Medicine

## 2020-07-19 LAB — COMPREHENSIVE METABOLIC PANEL
AG Ratio: 1.4 (calc) (ref 1.0–2.5)
ALT: 18 U/L (ref 6–29)
AST: 39 U/L — ABNORMAL HIGH (ref 10–35)
Albumin: 4.6 g/dL (ref 3.6–5.1)
Alkaline phosphatase (APISO): 69 U/L (ref 37–153)
BUN: 11 mg/dL (ref 7–25)
CO2: 25 mmol/L (ref 20–32)
Calcium: 10.5 mg/dL — ABNORMAL HIGH (ref 8.6–10.4)
Chloride: 99 mmol/L (ref 98–110)
Creat: 0.81 mg/dL (ref 0.50–0.99)
Globulin: 3.3 g/dL (calc) (ref 1.9–3.7)
Glucose, Bld: 112 mg/dL — ABNORMAL HIGH (ref 65–99)
Potassium: 4.1 mmol/L (ref 3.5–5.3)
Sodium: 134 mmol/L — ABNORMAL LOW (ref 135–146)
Total Bilirubin: 0.8 mg/dL (ref 0.2–1.2)
Total Protein: 7.9 g/dL (ref 6.1–8.1)

## 2020-07-19 LAB — CBC WITH DIFFERENTIAL/PLATELET
Absolute Monocytes: 530 cells/uL (ref 200–950)
Basophils Absolute: 40 cells/uL (ref 0–200)
Basophils Relative: 0.7 %
Eosinophils Absolute: 80 cells/uL (ref 15–500)
Eosinophils Relative: 1.4 %
HCT: 34.1 % — ABNORMAL LOW (ref 35.0–45.0)
Hemoglobin: 11.7 g/dL (ref 11.7–15.5)
Lymphs Abs: 2046 cells/uL (ref 850–3900)
MCH: 31 pg (ref 27.0–33.0)
MCHC: 34.3 g/dL (ref 32.0–36.0)
MCV: 90.2 fL (ref 80.0–100.0)
MPV: 10.2 fL (ref 7.5–12.5)
Monocytes Relative: 9.3 %
Neutro Abs: 3004 cells/uL (ref 1500–7800)
Neutrophils Relative %: 52.7 %
Platelets: 328 10*3/uL (ref 140–400)
RBC: 3.78 10*6/uL — ABNORMAL LOW (ref 3.80–5.10)
RDW: 12.5 % (ref 11.0–15.0)
Total Lymphocyte: 35.9 %
WBC: 5.7 10*3/uL (ref 3.8–10.8)

## 2020-07-19 LAB — CANCER ANTIGEN 19-9: CA 19-9: 24 U/mL (ref <34)

## 2020-07-19 LAB — LIPASE: Lipase: 17 U/L (ref 7–60)

## 2020-07-19 NOTE — Telephone Encounter (Signed)
PHONED PT. VM NOT SET UP. CHECKED OFFICE NOTE FROM YESTERDAY SHE ADVISED THE DR. TO CALL IT IN TO [facility] AND THAT IS WHERE IT IS. WILL TRY PT BACK LATER TODAY.

## 2020-07-19 NOTE — Telephone Encounter (Signed)
PATIENT NEEDS HER REFLUX MEDICATION SENT TO [facility] IN [place]

## 2020-07-24 NOTE — Telephone Encounter (Signed)
I called and followed up with the pt and was advised she hasn't started her med's because somehow there has been some confusion on where Rx was suppose to be and where she wanted it called to. Pt wants Rx called to [facility] on [address]

## 2020-07-24 NOTE — Telephone Encounter (Signed)
Phoned pt vm box is not set up (no answer)

## 2020-07-24 NOTE — Telephone Encounter (Signed)
I'm not sure what prescription is being referred to. I have not sent in a prescription. Per my OV note, we requested a list of her medicines because patient could not tell me what she was taking when I saw her at the office.   We received a list of meds from [place] nothing filled since 02/8256 (folic acid, pantoprazole, creon and vitamin D.)  She was also to call us with a complete list of her medications so we know what she is currently taking.   1. Let me know what meds she is on and what GI meds she is wanting RX for.  2. See result note as well for instructions from labs.

## 2020-07-30 NOTE — Progress Notes (Signed)
Judy Davis, We have tried to reach you on several occasions regarding your labs. Please call office at [phone] at your earliest convenience.        Thank you Pippa Hanif, CMA

## 2020-07-31 NOTE — Telephone Encounter (Signed)
WILL SEND PT A LETTER TODAY TO CONTACT THE OFFICE.

## 2020-08-01 ENCOUNTER — Telehealth: Payer: Self-pay | Admitting: Internal Medicine

## 2020-08-01 ENCOUNTER — Telehealth: Payer: Self-pay | Admitting: Gastroenterology

## 2020-08-01 DIAGNOSIS — K862 Cyst of pancreas: Secondary | ICD-10-CM

## 2020-08-01 NOTE — Telephone Encounter (Signed)
I have tried contacting this pt on several occasions so yesterday I mailed her a letter to call the office.  Stacey please schedule pt for office visit in 6 weeks  P [facility] clinical pool please schedule repeat MR Abd/MRCP with and without contrast. Also colonoscopy as well    Thank you

## 2020-08-01 NOTE — Telephone Encounter (Signed)
Discussed with Dr. Ardis Hughs regarding large pancreatic cyst with mass-effect causing pancreatic ductal dilation and previously elevated CA 19-9 (now normal). He does not advise EUS at this time.   He recommends repeat MR Abd/MRCP with and without contrast. Dx: large pancreatic pseudocyst, previously elevated CA19-9.   Please schedule.   Please make return OV in 6 weeks: pancreatitis and schedule colonoscopy.

## 2020-08-01 NOTE — Telephone Encounter (Signed)
Agree.   I did not give orders for colonoscopy. I stated she needs ov to schedule colonoscopy.  Patient called today at 1:15pm see separate phone note. Please call patient and provide information from today and 07/18/20 labs.  Will still need ov in six weeks to be scheduled. Has been routed to [facility].

## 2020-08-01 NOTE — Telephone Encounter (Signed)
We will schedule patient once you speak with her regarding recommendations and patient is agreeable to have this done. Once you speak with her, please send note back to [place] pool advising she was agreeable. Also in regards to TCS, looks like she is coming back in 6 weeks and she will be scheduled at that time per Mahala Menghini notes below. Thanks

## 2020-08-01 NOTE — Telephone Encounter (Signed)
Please call patient. [phone]

## 2020-08-02 ENCOUNTER — Encounter: Payer: Self-pay | Admitting: Internal Medicine

## 2020-08-02 NOTE — Telephone Encounter (Signed)
See other phone note

## 2020-08-02 NOTE — Telephone Encounter (Signed)
CALLED LM FOR PT TO RETURN CALL TO OUR OFFICE.

## 2020-08-02 NOTE — Telephone Encounter (Signed)
Patient scheduled and letter sent  °

## 2020-08-02 NOTE — Telephone Encounter (Signed)
SEE OTHER PHONE NOTE 

## 2020-08-06 NOTE — Telephone Encounter (Signed)
PATIENT SCHEDULED FOR 09/19/20

## 2020-08-06 NOTE — Telephone Encounter (Signed)
MRI abd MRCP scheduled for 08/17/20 at 1:00pm, arrive at 12:30pm. Tried to call pt, female answered and said she was asleep. Advised him to let her know I would be sending an appt letter to her. Appt letter mailed. No DPR in chart.  No PA need for MRI per [facility] website.

## 2020-08-06 NOTE — Addendum Note (Signed)
Addended by: Hassan Rowan on: 08/06/2020 01:55 PM   Modules accepted: Orders

## 2020-08-06 NOTE — Telephone Encounter (Signed)
noted 

## 2020-08-06 NOTE — Telephone Encounter (Signed)
Phoned and advised pt of Dr.Lewis and Dr.Jacobs recommendations for a repeat MR Abd/MRCP to be scheduled and an office visit in 6 weeks. Pt was agreeable to these things being done but it all depended upon her sister who is her transportation.

## 2020-08-17 ENCOUNTER — Ambulatory Visit (HOSPITAL_COMMUNITY): Payer: Medicare Other

## 2020-08-30 ENCOUNTER — Ambulatory Visit (HOSPITAL_COMMUNITY)
Admission: RE | Admit: 2020-08-30 | Discharge: 2020-08-30 | Disposition: A | Discharge status: Home / Self Care | Payer: Medicare Other | Source: Ambulatory Visit | Attending: Gastroenterology | Admitting: Gastroenterology

## 2020-08-30 ENCOUNTER — Other Ambulatory Visit: Payer: Self-pay

## 2020-08-30 ENCOUNTER — Other Ambulatory Visit: Payer: Self-pay | Admitting: Gastroenterology

## 2020-08-30 DIAGNOSIS — K862 Cyst of pancreas: Secondary | ICD-10-CM | POA: Insufficient documentation

## 2020-08-30 MED ORDER — GADOBUTROL 1 MMOL/ML IV SOLN
5.0000 mL | Freq: Once | INTRAVENOUS | Status: AC | PRN
  Administered 2020-08-30: 5 mL via INTRAVENOUS

## 2020-09-12 ENCOUNTER — Telehealth: Payer: Self-pay | Admitting: Gastroenterology

## 2020-09-12 NOTE — Telephone Encounter (Signed)
Discussed MRI findings with Dr. Christella Hartigan. See below. Will discuss further with pt at time of upcoming ov.

## 2020-09-12 NOTE — Telephone Encounter (Signed)
-----   Message from Rachael Fee, MD sent at 09/10/2020  7:40 AM EST ----- Elesa Massed for my delayed response, I was out of town last week and I am just getting back into the swing of things this morning.  Repeat MRI is very reassuring.  I do not think that she needs dedicated surveillance imaging unless new symptoms arise.  Thanks   ----- Message ----- From: De Blanch Sent: 09/06/2020  10:01 PM EST To: Rachael Fee, MD, Tiffany Kocher, PA-C  FYI, patient had follow up CA 19-9 that returned normal. MRCP/MR Abd showed some beading of pancreatic duct s/o chronic pancreatitis but inflammatory changes and pancreatic cystic lesions resolved.  ----- Message ----- From: Tiffany Kocher, PA-C Sent: 08/01/2020  11:24 AM EST To: Rachael Fee, MD, Tiffany Kocher, PA-C  Clinically patient is better since cutting back on etoh. I will reimage and keep you posted. Thanks. ----- Message ----- From: Rachael Fee, MD Sent: 07/24/2020   1:09 PM EST To: Tiffany Kocher, PA-C  I would get a repeat MR, MRCP to start.  Let me know what that shows.  How is the patient doing overall? ----- Message ----- From: Tiffany Kocher, PA-C Sent: 07/24/2020   1:01 PM EST To: Rachael Fee, MD, Tiffany Kocher, PA-C  Hi. Can you let me know if you would advise EUS for this patient vs repeat imaging.   64 year old female presented to the hospital back in June with poor oral intake, abdominal pain in the setting of alcohol abuse. Noted to have sodium of 122, creatinine 1.47, lipase 122. CT with acute on chronic pancreatitis with mildly complex pseudocyst. MRI/MRCP with lesion of concern in the head and proximal body of pancreas most consistent with large pancreatic pseudocyst with some local mass-effect causing pancreatic ductal dilation. Residual acute pancreatitis noted.   CA 19-9 was elevated at 74.   Seen in office recently. No complaints. Etoh reduced significantly and weight gain.  I rechecked her CA 19-9 and it is now normal at 24.  Verlon Au

## 2020-09-19 ENCOUNTER — Ambulatory Visit: Payer: Medicare Other | Admitting: Gastroenterology

## 2020-10-17 ENCOUNTER — Encounter: Payer: Self-pay | Admitting: Internal Medicine

## 2020-10-17 ENCOUNTER — Ambulatory Visit: Payer: Medicare Other | Admitting: Gastroenterology

## 2020-10-22 ENCOUNTER — Encounter: Payer: Self-pay | Admitting: Internal Medicine

## 2020-12-10 ENCOUNTER — Telehealth: Payer: Self-pay

## 2020-12-10 ENCOUNTER — Encounter: Payer: Self-pay | Admitting: Gastroenterology

## 2020-12-10 ENCOUNTER — Other Ambulatory Visit: Payer: Self-pay

## 2020-12-10 ENCOUNTER — Ambulatory Visit (INDEPENDENT_AMBULATORY_CARE_PROVIDER_SITE_OTHER): Payer: Medicare Other | Admitting: Gastroenterology

## 2020-12-10 VITALS — BP 170/84 | HR 96 | Temp 97.3°F | Ht 66.0 in | Wt 121.4 lb

## 2020-12-10 DIAGNOSIS — K860 Alcohol-induced chronic pancreatitis: Secondary | ICD-10-CM | POA: Diagnosis not present

## 2020-12-10 DIAGNOSIS — D649 Anemia, unspecified: Secondary | ICD-10-CM

## 2020-12-10 DIAGNOSIS — F1010 Alcohol abuse, uncomplicated: Secondary | ICD-10-CM

## 2020-12-10 DIAGNOSIS — Z1211 Encounter for screening for malignant neoplasm of colon: Secondary | ICD-10-CM | POA: Insufficient documentation

## 2020-12-10 DIAGNOSIS — K861 Other chronic pancreatitis: Secondary | ICD-10-CM | POA: Insufficient documentation

## 2020-12-10 NOTE — Progress Notes (Signed)
Primary Care Physician: [facility], [facility]  Primary Gastroenterologist:  Elon Alas. Abbey Chatters, DO   Chief Complaint  Patient presents with  . pancreatic mass    F/u. Doing okay. No complaints    HPI: Judy Davis is a 65 y.o. female here for follow-up.  Patient last seen in November 2021 for follow-up of pancreatic mass, anemia.   She was hospitalized in June 2021 when she presented with abdominal pain, lipase of 112.  CT abdomen and pelvis without contrast at that time showed acute on chronic pancreatitis, ill-defined mass in the proximal body of the pancreas measuring 3.2 x 3.2 cm suspected mildly complicated pseudocyst.  No pancreatic or common bile duct dilatation.  She completed MRI/MRCP (02/2020) which showed lesion of concern in the head and proximal body of pancreas most consistent with large pancreatic pseudocyst with some local mass-effect causing pancreatic ductal dilation.  Residual acute pancreatitis noted.  Prior CA 19-9 elevated at 74.    Patient seen in the office with follow-up CA 19-9 of 24, normal, after cutting back on alcohol.  Discussed case with Dr. Ardis Hughs recommended repeat imaging down the road.  MRCP/MRI abdomen repeated (08/2020) and showed some beading of pancreatic duct suggestive of chronic pancreatitis but inflammatory changes and pancreatic cystic changes had resolved.  He did not feel that she needed EUS or dedicated surveillance imaging unless new symptoms arise  EGD November 2020 with LA grade C esophagitis, gastritis, no H. pylori.  No prior colonoscopy.  Today: No etoh in 3 months. Husband still drinks. She is done with alcohol though. Feels so much better. Has gained weight since quit drinking. Pain on left rib cage side for 2 days, occurred after carrying and heavy wood. Appetite good. No heartburn, vomiting. BM good. No melena, brbpr.   Wt Readings from Last 3 Encounters:  12/10/20 121 lb 6.4 oz (55.1 kg)  07/18/20 125 lb 6.4 oz  (56.9 kg)  02/28/20 108 lb 0.4 oz (49 kg)     Current Outpatient Medications  Medication Sig Dispense Refill  . levothyroxine (SYNTHROID) 50 MCG tablet Take 50 mcg by mouth daily before breakfast.    . metoprolol tartrate (LOPRESSOR) 50 MG tablet Take 50 mg by mouth 2 (two) times daily.    . rosuvastatin (CRESTOR) 5 MG tablet Take 5 mg by mouth daily.    . pantoprazole (PROTONIX) 40 MG tablet Take 1 tablet (40 mg total) by mouth daily. (Patient not taking: Reported on 12/10/2020) 60 tablet 0   No current facility-administered medications for this visit.    Allergies as of 12/10/2020  . (No Known Allergies)   Past Medical History:  Diagnosis Date  . ETOH abuse   . HTN (hypertension)    Past Surgical History:  Procedure Laterality Date  . ABDOMINAL HYSTERECTOMY  1970's  . ESOPHAGOGASTRODUODENOSCOPY (EGD) WITH PROPOFOL N/A 08/09/2019   Fields: LA Grade C esophagitis. gastritis. no h.pylori    Family History  Problem Relation Age of Onset  . Other Mother        Homicide  . Cancer Father        possibly pancreatic  . Colon cancer Neg Hx   . Colon polyps Neg Hx    Social History   Tobacco Use  . Smoking status: Never Smoker  . Smokeless tobacco: Never Used  Vaping Use  . Vaping Use: Never used  Substance Use Topics  . Alcohol use: Yes    Comment: occ 1 beer/day  .  Drug use: No     ROS:  General: Negative for anorexia, weight loss, fever, chills, fatigue, weakness. ENT: Negative for hoarseness, difficulty swallowing , nasal congestion. CV: Negative for chest pain, angina, palpitations, dyspnea on exertion, peripheral edema.  Respiratory: Negative for dyspnea at rest, dyspnea on exertion, cough, sputum, wheezing.  GI: See history of present illness. GU:  Negative for dysuria, hematuria, urinary incontinence, urinary frequency, nocturnal urination.  Endo: Negative for unusual weight change.    Physical Examination:   BP (!) 170/84 (BP Location: Right Arm, Cuff  Size: Normal)   Pulse 96   Temp (!) 97.3 F (36.3 C)   Ht 5\' 6"  (1.676 m)   Wt 121 lb 6.4 oz (55.1 kg)   BMI 19.59 kg/m   General: Well-nourished, well-developed in no acute distress.  Eyes: No icterus. Mouth: Oropharyngeal mucosa moist and pink , no lesions erythema or exudate.  Multiple teeth missing. Lungs: Clear to auscultation bilaterally.  Heart: Regular rate and rhythm, no murmurs rubs or gallops.  Abdomen: Bowel sounds are normal, nontender, nondistended, no hepatosplenomegaly or masses, no abdominal bruits or hernia , no rebound or guarding.  Mild tenderness over the left lower rib cage without deformity. Extremities: No lower extremity edema. No clubbing or deformities. Neuro: Alert and oriented x 4   Skin: Warm and dry, no jaundice.   Psych: Alert and cooperative, normal mood and affect.  Labs:  Lab Results  Component Value Date   CREATININE 0.81 07/18/2020   BUN 11 07/18/2020   NA 134 (L) 07/18/2020   K 4.1 07/18/2020   CL 99 07/18/2020   CO2 25 07/18/2020   Lab Results  Component Value Date   ALT 18 07/18/2020   AST 39 (H) 07/18/2020   ALKPHOS 69 03/03/2020   BILITOT 0.8 07/18/2020   Lab Results  Component Value Date   WBC 5.7 07/18/2020   HGB 11.7 07/18/2020   HCT 34.1 (L) 07/18/2020   MCV 90.2 07/18/2020   PLT 328 07/18/2020   Lab Results  Component Value Date   LIPASE 17 07/18/2020     Lab Results  Component Value Date   FOLATE 9.6 03/01/2020   Lab Results  Component Value Date   VITAMINB12 315 03/01/2020   Lab Results  Component Value Date   IRON 33 03/01/2020   TIBC 160 (L) 03/01/2020   FERRITIN 435 (H) 03/01/2020    Imaging Studies: No results found.   Assessment/Plan:  Pleasant 65 y/o female with history of pancreatic mass, anemia in setting of etoh abuse presenting for follow up.   Fortunately her pancreatic mass turned out to be pancreatic pseudocyst and had resolved at time of last imaging. She did have MRI evidence of  chronic pancreatitis. She has quit drinking etoh, none in the last 3 months. She is feeling much better. Denies diarrhea. She is gaining weight. Has had some pain for two days at left lower ribs, possibly musculoskeletal in setting of carrying heavy wood. She will monitor and let me know if persistent.   She also had anemia dating back for years but this too has improved with etoh cessation and improved nutrition. She had EGD 07/2019 with LA Grade C esophagitis but no prior colonoscopy. Recommend screening colonoscopy at this time.   HTN: she states she has not taken her BP medicaiton today and plans to take when she gets home. Denies chest pain, dizziness, sob, headache.   We have contacted [facility] [address] and [facility] [place] to get  update medication list per patient request.    1. Colonoscopy in the near future with Dr. Abbey Chatters. ASA III.  I have discussed the risks, alternatives, benefits with regards to but not limited to the risk of reaction to medication, bleeding, infection, perforation and the patient is agreeable to proceed. Written consent to be obtained. 2. Update labs today.  3. Continue etoh cessation.

## 2020-12-10 NOTE — Telephone Encounter (Signed)
Judy Davis I contacted [facility] here in [place] and the pt get her Pantoprazole from there but she hasn't got it filled since 03/04/2020. Mindy had contacted [facility]

## 2020-12-10 NOTE — Patient Instructions (Signed)
1. Please complete blood work at [facility]. 2. Colonoscopy as scheduled.  Please see separate instructions. 3. Great job on giving up all alcohol!  Keep up the good work!

## 2020-12-21 NOTE — Telephone Encounter (Signed)
Spoke with the pharmacist @ [facility].he stated the pt hasn't had anything filled by them in the past year and a half. Therefore there is no list to send.

## 2020-12-21 NOTE — Telephone Encounter (Signed)
Ok. According to [facility] she has not had medications filled anytime recently.   Please have patient complete labs that were ordered 12/10/20 but need to ADD TSH too.  Schedule for screening colonoscopy with Dr. Abbey Chatters. ASA III.

## 2020-12-21 NOTE — Telephone Encounter (Signed)
I never received medication list from [facility], [address]. Can we request again.   [facility] clinical: Are we waiting to schedule colonoscopy because of no medication list or did pt choose not to schedule?

## 2020-12-21 NOTE — Telephone Encounter (Addendum)
We don't have an encounter form to schedule TCS.

## 2020-12-24 ENCOUNTER — Other Ambulatory Visit: Payer: Self-pay

## 2020-12-24 MED ORDER — PEG 3350-KCL-NA BICARB-NACL 420 G PO SOLR: 4000.0000 mL | ORAL | 0 refills | Status: DC

## 2020-12-24 NOTE — Telephone Encounter (Signed)
Spoke to pt, TCS scheduled for 02/05/21--AM. Orders entered. Rx for prep sent to pharmacy.

## 2020-12-24 NOTE — Telephone Encounter (Signed)
Tried to call pt, unable to leave voicemail d/t mailbox hasn't been set-up.

## 2020-12-24 NOTE — Telephone Encounter (Signed)
Pre-op/covid test 02/01/21. Appt letter mailed with procedure instructions.

## 2020-12-25 ENCOUNTER — Other Ambulatory Visit: Payer: Self-pay

## 2020-12-25 NOTE — Telephone Encounter (Signed)
Phoned and LM with pt's husband to have his wife return call regarding labs needing to be done from last month.

## 2020-12-25 NOTE — Telephone Encounter (Signed)
Returned pt's call on the phone message that she left she is going to the lab tomorrow.

## 2020-12-26 ENCOUNTER — Other Ambulatory Visit (HOSPITAL_COMMUNITY)
Admission: RE | Admit: 2020-12-26 | Discharge: 2020-12-26 | Disposition: A | Discharge status: Home / Self Care | Payer: Medicare Other | Source: Ambulatory Visit | Attending: Gastroenterology | Admitting: Gastroenterology

## 2020-12-26 ENCOUNTER — Other Ambulatory Visit: Payer: Self-pay

## 2020-12-26 DIAGNOSIS — Z1211 Encounter for screening for malignant neoplasm of colon: Secondary | ICD-10-CM | POA: Diagnosis present

## 2020-12-26 DIAGNOSIS — D649 Anemia, unspecified: Secondary | ICD-10-CM | POA: Diagnosis present

## 2020-12-26 DIAGNOSIS — F1010 Alcohol abuse, uncomplicated: Secondary | ICD-10-CM | POA: Insufficient documentation

## 2020-12-26 LAB — COMPREHENSIVE METABOLIC PANEL
ALT: 23 U/L (ref 0–44)
AST: 45 U/L — ABNORMAL HIGH (ref 15–41)
Albumin: 4.3 g/dL (ref 3.5–5.0)
Alkaline Phosphatase: 79 U/L (ref 38–126)
Anion gap: 13 (ref 5–15)
BUN: 10 mg/dL (ref 8–23)
CO2: 27 mmol/L (ref 22–32)
Calcium: 10.1 mg/dL (ref 8.9–10.3)
Chloride: 95 mmol/L — ABNORMAL LOW (ref 98–111)
Creatinine, Ser: 0.82 mg/dL (ref 0.44–1.00)
GFR, Estimated: >60 mL/min (ref >60)
Glucose, Bld: 120 mg/dL — ABNORMAL HIGH (ref 70–99)
Potassium: 3.6 mmol/L (ref 3.5–5.1)
Sodium: 135 mmol/L (ref 135–145)
Total Bilirubin: 0.6 mg/dL (ref 0.3–1.2)
Total Protein: 8.2 g/dL — ABNORMAL HIGH (ref 6.5–8.1)

## 2020-12-26 LAB — CBC WITH DIFFERENTIAL/PLATELET
Abs Immature Granulocytes: 0.06 10*3/uL (ref 0.00–0.07)
Basophils Absolute: 0.1 10*3/uL (ref 0.0–0.1)
Basophils Relative: 1 %
Eosinophils Absolute: 0.2 10*3/uL (ref 0.0–0.5)
Eosinophils Relative: 3 %
HCT: 32.8 % — ABNORMAL LOW (ref 36.0–46.0)
Hemoglobin: 10.7 g/dL — ABNORMAL LOW (ref 12.0–15.0)
Immature Granulocytes: 1 %
Lymphocytes Relative: 32 %
Lymphs Abs: 2.2 10*3/uL (ref 0.7–4.0)
MCH: 31.8 pg (ref 26.0–34.0)
MCHC: 32.6 g/dL (ref 30.0–36.0)
MCV: 97.6 fL (ref 80.0–100.0)
Monocytes Absolute: 0.9 10*3/uL (ref 0.1–1.0)
Monocytes Relative: 14 %
Neutro Abs: 3.4 10*3/uL (ref 1.7–7.7)
Neutrophils Relative %: 49 %
Platelets: 168 10*3/uL (ref 150–400)
RBC: 3.36 MIL/uL — ABNORMAL LOW (ref 3.87–5.11)
RDW: 13.9 % (ref 11.5–15.5)
WBC: 6.9 10*3/uL (ref 4.0–10.5)
nRBC: 0 % (ref 0.0–0.2)

## 2020-12-26 LAB — LIPASE, BLOOD: Lipase: 24 U/L (ref 11–51)

## 2021-01-08 ENCOUNTER — Other Ambulatory Visit: Payer: Self-pay

## 2021-01-08 DIAGNOSIS — K219 Gastro-esophageal reflux disease without esophagitis: Secondary | ICD-10-CM

## 2021-01-08 DIAGNOSIS — D649 Anemia, unspecified: Secondary | ICD-10-CM

## 2021-01-08 DIAGNOSIS — K859 Acute pancreatitis without necrosis or infection, unspecified: Secondary | ICD-10-CM

## 2021-01-08 DIAGNOSIS — R7989 Other specified abnormal findings of blood chemistry: Secondary | ICD-10-CM

## 2021-01-08 DIAGNOSIS — K861 Other chronic pancreatitis: Secondary | ICD-10-CM

## 2021-01-08 NOTE — Progress Notes (Signed)
Dear Mrs. Shontz,   Your hemoglobin overall is better than one year ago but a slight decline since 07/2020 (now 10.7 down from 11.7). your AST is slightly elevated at 45.  We ask and encourage you NO ALCOHOL USE. Please do your colonoscopy as scheduled. We will need to repeat your labs in 3 months. We will send the forms out to you closer to the due date of your labs being due.   If you have any questions or concerns please feel free to call the office @ [phone].   Thank you, Oleh Genin

## 2021-01-29 ENCOUNTER — Telehealth: Payer: Self-pay | Admitting: *Deleted

## 2021-01-29 NOTE — Telephone Encounter (Signed)
PA approved via [facility] website for TCS. Authorization #: [id number], DOS 02/05/2021-05/06/2021

## 2021-01-30 NOTE — Patient Instructions (Addendum)
Judy Davis  01/30/2021     @PREFPERIOPPHARMACY @   Your procedure is scheduled on  02/05/2021.   Report to [facility] at  0930  A.M.   Call this number if you have problems the morning of surgery:  [phone]   Remember:  Follow the diet and prep instructions given to you by the office.                   Take these medicines the morning of surgery with A SIP OF WATER:  Metoprolol and Synthroid    Please brush your teeth.  Do not wear jewelry, make-up or nail polish.  Do not wear lotions, powders, or perfumes, or deodorant.  Do not shave 48 hours prior to surgery.  Men may shave face and neck.  Do not bring valuables to the hospital.  [facility] is not responsible for any belongings or valuables.  Contacts, dentures or bridgework may not be worn into surgery.  Leave your suitcase in the car.  After surgery it may be brought to your room.  For patients admitted to the hospital, discharge time will be determined by your treatment team.  Patients discharged the day of surgery will not be allowed to drive home and must have someone with them for 24 hours.   Special instructions:  DO NOT smoke tobacco or vape fore 24 hours before your procedure.   Please read over the following fact sheets that you were given. Anesthesia Post-op Instructions and Care and Recovery After Surgery       Colonoscopy, Adult, Care After This sheet gives you information about how to care for yourself after your procedure. Your health care provider may also give you more specific instructions. If you have problems or questions, contact your health care provider. What can I expect after the procedure? After the procedure, it is common to have:  A small amount of blood in your stool for 24 hours after the procedure.  Some gas.  Mild cramping or bloating of your abdomen. Follow these instructions at home: Eating and drinking  Drink enough fluid to keep your urine pale  yellow.  Follow instructions from your health care provider about eating or drinking restrictions.  Resume your normal diet as instructed by your health care provider. Avoid heavy or fried foods that are hard to digest.   Activity  Rest as told by your health care provider.  Avoid sitting for a long time without moving. Get up to take short walks every 1-2 hours. This is important to improve blood flow and breathing. Ask for help if you feel weak or unsteady.  Return to your normal activities as told by your health care provider. Ask your health care provider what activities are safe for you. Managing cramping and bloating  Try walking around when you have cramps or feel bloated.  Apply heat to your abdomen as told by your health care provider. Use the heat source that your health care provider recommends, such as a moist heat pack or a heating pad. ? Place a towel between your skin and the heat source. ? Leave the heat on for 20-30 minutes. ? Remove the heat if your skin turns bright red. This is especially important if you are unable to feel pain, heat, or cold. You may have a greater risk of getting burned.   General instructions  If you were given a sedative during the procedure, it can affect you for  several hours. Do not drive or operate machinery until your health care provider says that it is safe.  For the first 24 hours after the procedure: ? Do not sign important documents. ? Do not drink alcohol. ? Do your regular daily activities at a slower pace than normal. ? Eat soft foods that are easy to digest.  Take over-the-counter and prescription medicines only as told by your health care provider.  Keep all follow-up visits as told by your health care provider. This is important. Contact a health care provider if:  You have blood in your stool 2-3 days after the procedure. Get help right away if you have:  More than a small spotting of blood in your stool.  Large blood  clots in your stool.  Swelling of your abdomen.  Nausea or vomiting.  A fever.  Increasing pain in your abdomen that is not relieved with medicine. Summary  After the procedure, it is common to have a small amount of blood in your stool. You may also have mild cramping and bloating of your abdomen.  If you were given a sedative during the procedure, it can affect you for several hours. Do not drive or operate machinery until your health care provider says that it is safe.  Get help right away if you have a lot of blood in your stool, nausea or vomiting, a fever, or increased pain in your abdomen. This information is not intended to replace advice given to you by your health care provider. Make sure you discuss any questions you have with your health care provider. Document Revised: 08/26/2019 Document Reviewed: 03/28/2019 Elsevier Patient Education  2021 [employer] After This sheet gives you information about how to care for yourself after your procedure. Your health care provider may also give you more specific instructions. If you have problems or questions, contact your health care provider. What can I expect after the procedure? After the procedure, it is common to have:  Tiredness.  Forgetfulness about what happened after the procedure.  Impaired judgment for important decisions.  Nausea or vomiting.  Some difficulty with balance. Follow these instructions at home: For the time period you were told by your health care provider:  Rest as needed.  Do not participate in activities where you could fall or become injured.  Do not drive or use machinery.  Do not drink alcohol.  Do not take sleeping pills or medicines that cause drowsiness.  Do not make important decisions or sign legal documents.  Do not take care of children on your own.      Eating and drinking  Follow the diet that is recommended by your health care  provider.  Drink enough fluid to keep your urine pale yellow.  If you vomit: ? Drink water, juice, or soup when you can drink without vomiting. ? Make sure you have little or no nausea before eating solid foods. General instructions  Have a responsible adult stay with you for the time you are told. It is important to have someone help care for you until you are awake and alert.  Take over-the-counter and prescription medicines only as told by your health care provider.  If you have sleep apnea, surgery and certain medicines can increase your risk for breathing problems. Follow instructions from your health care provider about wearing your sleep device: ? Anytime you are sleeping, including during daytime naps. ? While taking prescription pain medicines, sleeping medicines, or medicines that make  you drowsy.  Avoid smoking.  Keep all follow-up visits as told by your health care provider. This is important. Contact a health care provider if:  You keep feeling nauseous or you keep vomiting.  You feel light-headed.  You are still sleepy or having trouble with balance after 24 hours.  You develop a rash.  You have a fever.  You have redness or swelling around the IV site. Get help right away if:  You have trouble breathing.  You have new-onset confusion at home. Summary  For several hours after your procedure, you may feel tired. You may also be forgetful and have poor judgment.  Have a responsible adult stay with you for the time you are told. It is important to have someone help care for you until you are awake and alert.  Rest as told. Do not drive or operate machinery. Do not drink alcohol or take sleeping pills.  Get help right away if you have trouble breathing, or if you suddenly become confused. This information is not intended to replace advice given to you by your health care provider. Make sure you discuss any questions you have with your health care  provider. Document Revised: 05/17/2020 Document Reviewed: 08/04/2019 Elsevier Patient Education  2021 [employer].

## 2021-02-01 ENCOUNTER — Other Ambulatory Visit (HOSPITAL_COMMUNITY): Admission: RE | Admit: 2021-02-01 | Payer: Medicare Other | Source: Ambulatory Visit

## 2021-02-01 ENCOUNTER — Encounter (HOSPITAL_COMMUNITY): Payer: Self-pay

## 2021-02-01 ENCOUNTER — Encounter (HOSPITAL_COMMUNITY): Admission: RE | Admit: 2021-02-01 | Payer: Medicare Other | Source: Ambulatory Visit

## 2021-02-01 ENCOUNTER — Encounter (HOSPITAL_COMMUNITY)
Admission: RE | Admit: 2021-02-01 | Discharge: 2021-02-01 | Disposition: A | Discharge status: Home / Self Care | Payer: Medicare Other | Source: Ambulatory Visit | Attending: Internal Medicine | Admitting: Internal Medicine

## 2021-02-01 ENCOUNTER — Other Ambulatory Visit: Payer: Self-pay

## 2021-02-01 DIAGNOSIS — Z01812 Encounter for preprocedural laboratory examination: Secondary | ICD-10-CM | POA: Diagnosis not present

## 2021-02-01 DIAGNOSIS — Z20822 Contact with and (suspected) exposure to covid-19: Secondary | ICD-10-CM | POA: Insufficient documentation

## 2021-02-01 HISTORY — DX: Hyperlipidemia, unspecified: E78.5

## 2021-02-01 HISTORY — DX: Hypothyroidism, unspecified: E03.9

## 2021-02-01 LAB — COMPREHENSIVE METABOLIC PANEL
ALT: 18 U/L (ref 0–44)
AST: 43 U/L — ABNORMAL HIGH (ref 15–41)
Albumin: 4.5 g/dL (ref 3.5–5.0)
Alkaline Phosphatase: 73 U/L (ref 38–126)
Anion gap: 10 (ref 5–15)
BUN: 13 mg/dL (ref 8–23)
CO2: 22 mmol/L (ref 22–32)
Calcium: 9.3 mg/dL (ref 8.9–10.3)
Chloride: 98 mmol/L (ref 98–111)
Creatinine, Ser: 0.72 mg/dL (ref 0.44–1.00)
GFR, Estimated: >60 mL/min (ref >60)
Glucose, Bld: 96 mg/dL (ref 70–99)
Potassium: 3.9 mmol/L (ref 3.5–5.1)
Sodium: 130 mmol/L — ABNORMAL LOW (ref 135–145)
Total Bilirubin: 1 mg/dL (ref 0.3–1.2)
Total Protein: 8.2 g/dL — ABNORMAL HIGH (ref 6.5–8.1)

## 2021-02-01 LAB — CBC WITH DIFFERENTIAL/PLATELET
Abs Immature Granulocytes: 0.04 10*3/uL (ref 0.00–0.07)
Basophils Absolute: 0.1 10*3/uL (ref 0.0–0.1)
Basophils Relative: 1 %
Eosinophils Absolute: 0.1 10*3/uL (ref 0.0–0.5)
Eosinophils Relative: 1 %
HCT: 32.2 % — ABNORMAL LOW (ref 36.0–46.0)
Hemoglobin: 10.3 g/dL — ABNORMAL LOW (ref 12.0–15.0)
Immature Granulocytes: 1 %
Lymphocytes Relative: 32 %
Lymphs Abs: 2.4 10*3/uL (ref 0.7–4.0)
MCH: 31.6 pg (ref 26.0–34.0)
MCHC: 32 g/dL (ref 30.0–36.0)
MCV: 98.8 fL (ref 80.0–100.0)
Monocytes Absolute: 0.6 10*3/uL (ref 0.1–1.0)
Monocytes Relative: 7 %
Neutro Abs: 4.5 10*3/uL (ref 1.7–7.7)
Neutrophils Relative %: 58 %
Platelets: 227 10*3/uL (ref 150–400)
RBC: 3.26 MIL/uL — ABNORMAL LOW (ref 3.87–5.11)
RDW: 13.5 % (ref 11.5–15.5)
WBC: 7.7 10*3/uL (ref 4.0–10.5)
nRBC: 0 % (ref 0.0–0.2)

## 2021-02-01 LAB — SARS CORONAVIRUS 2 (TAT 6-24 HRS): SARS Coronavirus 2: NEGATIVE

## 2021-02-04 ENCOUNTER — Other Ambulatory Visit: Payer: Self-pay | Admitting: Gastroenterology

## 2021-02-04 MED ORDER — SODIUM CHLORIDE 1 G PO TABS: 2.0000 g | ORAL_TABLET | Freq: Once | ORAL | 0 refills | Status: AC

## 2021-02-04 NOTE — Progress Notes (Signed)
Pre-op labs with sodium 130. She has chronic hyponatremia. Per Dr. Abbey Chatters, anesthesia is requesting 2 salt tabs prior to procedure. I have called this into the pharmacy and let patient know. Take 2 tablets tomorrow morning at 0600.   Annitta Needs, PhD, ANP-BC [facility]

## 2021-02-05 ENCOUNTER — Ambulatory Visit (HOSPITAL_COMMUNITY): Payer: Medicare Other | Admitting: Anesthesiology

## 2021-02-05 ENCOUNTER — Encounter (HOSPITAL_COMMUNITY)
Admission: RE | Disposition: A | Discharge status: Home / Self Care | Payer: Self-pay | Source: Ambulatory Visit | Attending: Internal Medicine

## 2021-02-05 ENCOUNTER — Ambulatory Visit (HOSPITAL_COMMUNITY)
Admission: RE | Admit: 2021-02-05 | Discharge: 2021-02-05 | Disposition: A | Discharge status: Home / Self Care | Payer: Medicare Other | Source: Ambulatory Visit | Attending: Internal Medicine | Admitting: Internal Medicine

## 2021-02-05 ENCOUNTER — Encounter (HOSPITAL_COMMUNITY): Payer: Self-pay

## 2021-02-05 ENCOUNTER — Other Ambulatory Visit: Payer: Self-pay

## 2021-02-05 DIAGNOSIS — K5730 Diverticulosis of large intestine without perforation or abscess without bleeding: Secondary | ICD-10-CM | POA: Insufficient documentation

## 2021-02-05 DIAGNOSIS — Z1211 Encounter for screening for malignant neoplasm of colon: Secondary | ICD-10-CM | POA: Insufficient documentation

## 2021-02-05 DIAGNOSIS — Z79899 Other long term (current) drug therapy: Secondary | ICD-10-CM | POA: Insufficient documentation

## 2021-02-05 DIAGNOSIS — Z79890 Hormone replacement therapy (postmenopausal): Secondary | ICD-10-CM | POA: Diagnosis not present

## 2021-02-05 DIAGNOSIS — K648 Other hemorrhoids: Secondary | ICD-10-CM | POA: Insufficient documentation

## 2021-02-05 DIAGNOSIS — Z8719 Personal history of other diseases of the digestive system: Secondary | ICD-10-CM | POA: Diagnosis not present

## 2021-02-05 HISTORY — PX: COLONOSCOPY WITH PROPOFOL: SHX5780

## 2021-02-05 SURGERY — COLONOSCOPY WITH PROPOFOL
Anesthesia: General

## 2021-02-05 MED ORDER — STERILE WATER FOR IRRIGATION IR SOLN
Status: DC | PRN
  Administered 2021-02-05: 100 mL

## 2021-02-05 MED ORDER — LIDOCAINE HCL (CARDIAC) PF 50 MG/5ML IV SOSY
PREFILLED_SYRINGE | INTRAVENOUS | Status: DC | PRN
  Administered 2021-02-05: 30 mg via INTRAVENOUS

## 2021-02-05 MED ORDER — LACTATED RINGERS IV SOLN: INTRAVENOUS | Status: DC

## 2021-02-05 MED ORDER — PROPOFOL 10 MG/ML IV BOLUS
INTRAVENOUS | Status: DC | PRN
  Administered 2021-02-05: 125 ug/kg/min via INTRAVENOUS
  Administered 2021-02-05: 70 mg via INTRAVENOUS

## 2021-02-05 NOTE — Discharge Instructions (Signed)
  Colonoscopy Discharge Instructions  Read the instructions outlined below and refer to this sheet in the next few weeks. These discharge instructions provide you with general information on caring for yourself after you leave the hospital. Your doctor may also give you specific instructions. While your treatment has been planned according to the most current medical practices available, unavoidable complications occasionally occur.   ACTIVITY  You may resume your regular activity, but move at a slower pace for the next 24 hours.   Take frequent rest periods for the next 24 hours.   Walking will help get rid of the air and reduce the bloated feeling in your belly (abdomen).   No driving for 24 hours (because of the medicine (anesthesia) used during the test).    Do not sign any important legal documents or operate any machinery for 24 hours (because of the anesthesia used during the test).  NUTRITION  Drink plenty of fluids.   You may resume your normal diet as instructed by your doctor.   Begin with a light meal and progress to your normal diet. Heavy or fried foods are harder to digest and may make you feel sick to your stomach (nauseated).   Avoid alcoholic beverages for 24 hours or as instructed.  MEDICATIONS  You may resume your normal medications unless your doctor tells you otherwise.  WHAT YOU CAN EXPECT TODAY  Some feelings of bloating in the abdomen.   Passage of more gas than usual.   Spotting of blood in your stool or on the toilet paper.  IF YOU HAD POLYPS REMOVED DURING THE COLONOSCOPY:  No aspirin products for 7 days or as instructed.   No alcohol for 7 days or as instructed.   Eat a soft diet for the next 24 hours.  FINDING OUT THE RESULTS OF YOUR TEST Not all test results are available during your visit. If your test results are not back during the visit, make an appointment with your caregiver to find out the results. Do not assume everything is normal if  you have not heard from your caregiver or the medical facility. It is important for you to follow up on all of your test results.  SEEK IMMEDIATE MEDICAL ATTENTION IF:  You have more than a spotting of blood in your stool.   Your belly is swollen (abdominal distention).   You are nauseated or vomiting.   You have a temperature over 101.   You have abdominal pain or discomfort that is severe or gets worse throughout the day.   Unfortunately your colon was not adequately prepped for a full examination today.  There was stool throughout the middle portion of your colon which I was unable to clean properly.  As such, I cannot rule out small polyps in this region of the colon.  I would recommend that we repeat this in 6 months with a different colon preparation.  I hope you have a great rest of your week!  Elon Alas. Abbey Chatters, D.O. Gastroenterology and Hepatology [facility]

## 2021-02-05 NOTE — H&P (Signed)
Primary Care Physician:  [facility] Primary Gastroenterologist:  Dr. Abbey Chatters  Pre-Procedure History & Physical: HPI:  Judy Davis is a 65 y.o. female is here for a colonoscopy for colon cancer screening purposes.  Patient denies any family history of colorectal cancer.  No melena or hematochezia.  No unintentional weight loss.  No change in bowel habits.   Past Medical History:  Diagnosis Date  . ETOH abuse   . HTN (hypertension)   . Hyperlipidemia   . Hypothyroidism     Past Surgical History:  Procedure Laterality Date  . ABDOMINAL HYSTERECTOMY  1970's  . ESOPHAGOGASTRODUODENOSCOPY (EGD) WITH PROPOFOL N/A 08/09/2019   Fields: LA Grade C esophagitis. gastritis. no h.pylori    Prior to Admission medications   Medication Sig Start Date End Date Taking? Authorizing Provider  levothyroxine (SYNTHROID) 50 MCG tablet Take 50 mcg by mouth daily before breakfast.   Yes [provider]  metoprolol tartrate (LOPRESSOR) 50 MG tablet Take 50 mg by mouth 2 (two) times daily.   Yes [provider]  polyethylene glycol-electrolytes (TRILYTE) 420 g solution Take 4,000 mLs by mouth as directed. 12/24/20  Yes Hurshel Keys K, DO  rosuvastatin (CRESTOR) 5 MG tablet Take 5 mg by mouth daily. Patient not taking: No sig reported    [provider]    Allergies as of 12/24/2020  . (No Known Allergies)    Family History  Problem Relation Age of Onset  . Other Mother        Homicide  . Cancer Father        possibly pancreatic  . Colon cancer Neg Hx   . Colon polyps Neg Hx     Social History   Socioeconomic History  . Marital status: Married    Spouse name: Not on file  . Number of children: Not on file  . Years of education: Not on file  . Highest education level: Not on file  Occupational History  . Occupation: unemployed  Tobacco Use  . Smoking status: Never Smoker  . Smokeless tobacco: Never Used  Vaping Use  . Vaping Use:  Never used  Substance and Sexual Activity  . Alcohol use: Yes    Comment: occ 1 beer/day  . Drug use: No  . Sexual activity: Not on file  Other Topics Concern  . Not on file  Social History Narrative  . Not on file   Social Determinants of Health   Financial Resource Strain: Not on file  Food Insecurity: Not on file  Transportation Needs: Not on file  Physical Activity: Not on file  Stress: Not on file  Social Connections: Not on file  Intimate Partner Violence: Not on file    Review of Systems: See HPI, otherwise negative ROS  Physical Exam: Vital signs in last 24 hours: Temp:  [97.8 F (36.6 C)] 97.8 F (36.6 C) (05/24 0940) Pulse Rate:  [87] 87 (05/24 0940) Resp:  [16] 16 (05/24 0937) BP: (180)/(83) 180/83 (05/24 0940) SpO2:  [99 %] 99 % (05/24 0940)   General:   Alert,  Well-developed, well-nourished, pleasant and cooperative in NAD Head:  Normocephalic and atraumatic. Eyes:  Sclera clear, no icterus.   Conjunctiva pink. Ears:  Normal auditory acuity. Nose:  No deformity, discharge,  or lesions. Mouth:  No deformity or lesions, dentition normal. Neck:  Supple; no masses or thyromegaly. Lungs:  Clear throughout to auscultation.   No wheezes, crackles, or rhonchi. No acute distress. Heart:  Regular rate  and rhythm; no murmurs, clicks, rubs,  or gallops. Abdomen:  Soft, nontender and nondistended. No masses, hepatosplenomegaly or hernias noted. Normal bowel sounds, without guarding, and without rebound.   Msk:  Symmetrical without gross deformities. Normal posture. Extremities:  Without clubbing or edema. Neurologic:  Alert and  oriented x4;  grossly normal neurologically. Skin:  Intact without significant lesions or rashes. Cervical Nodes:  No significant cervical adenopathy. Psych:  Alert and cooperative. Normal mood and affect.  Impression/Plan: Judy Davis is here for a colonoscopy to be performed for colon cancer screening purposes.  The risks of the  procedure including infection, bleed, or perforation as well as benefits, limitations, alternatives and imponderables have been reviewed with the patient. Questions have been answered. All parties agreeable.

## 2021-02-05 NOTE — Op Note (Signed)
[facility] Patient Name: Judy Davis Procedure Date: 02/05/2021 10:46 AM MRN: [id number] Date of Birth: 1956/01/16 Attending MD: Elon Alas. Abbey Chatters DO CSN: [id number] Age: 65 Admit Type: Outpatient Procedure:                Colonoscopy Indications:              Screening for colorectal malignant neoplasm Providers:                Elon Alas. Abbey Chatters, DO, Tammy Vaught, RN, Aram Candela Referring MD:              Medicines:                See the Anesthesia note for documentation of the                            administered medications Complications:            No immediate complications. Estimated Blood Loss:     Estimated blood loss: none. Procedure:                Pre-Anesthesia Assessment:                           - The anesthesia plan was to use monitored                            anesthesia care (MAC).                           After obtaining informed consent, the colonoscope                            was passed under direct vision. Throughout the                            procedure, the patient's blood pressure, pulse, and                            oxygen saturations were monitored continuously. The                            [id number]-HQ190L ([id number]) scope was introduced through                            the anus and advanced to the the cecum, identified                            by appendiceal orifice and ileocecal valve. The                            colonoscopy was performed without difficulty. The                            patient tolerated the procedure well. The quality  of the bowel preparation was evaluated using the                            BBPS [facility] Bowel Preparation Scale) with scores                            of: Right Colon = 2 (minor amount of residual                            staining, small fragments of stool and/or opaque                            liquid, but mucosa seen well),  Transverse Colon = 1                            (portion of mucosa seen, but other areas not well                            seen due to staining, residual stool and/or opaque                            liquid) and Left Colon = 2 (minor amount of                            residual staining, small fragments of stool and/or                            opaque liquid, but mucosa seen well). The total                            BBPS score equals 5. The quality of the bowel                            preparation was inadequate. Scope In: 10:59:46 AM Scope Out: 11:15:26 AM Scope Withdrawal Time: 0 hours 11 minutes 38 seconds  Total Procedure Duration: 0 hours 15 minutes 40 seconds  Findings:      The perianal and digital rectal examinations were normal.      Non-bleeding internal hemorrhoids were found during endoscopy.      Multiple small and large-mouthed diverticula were found in the sigmoid       colon and descending colon.      A large amount of stool was found in the descending colon and in the       transverse colon, precluding visualization. Lavage of the area was       performed using copious amounts of sterile water, resulting in       incomplete clearance with continued poor visualization. Impression:               - Preparation of the colon was inadequate.                           - Non-bleeding internal hemorrhoids.                           -  Diverticulosis in the sigmoid colon and in the                            descending colon.                           - Stool in the descending colon and in the                            transverse colon.                           - No specimens collected. Moderate Sedation:      Per Anesthesia Care Recommendation:           - Patient has a contact number available for                            emergencies. The signs and symptoms of potential                            delayed complications were discussed with the                             patient. Return to normal activities tomorrow.                            Written discharge instructions were provided to the                            patient.                           - Resume previous diet.                           - Continue present medications.                           - Repeat colonoscopy in 6 months because the bowel                            preparation was suboptimal.                           - Return to GI clinic after studies are complete. Procedure Code(s):        --- Professional ---                           O5929, Colorectal cancer screening; colonoscopy on                            individual not meeting criteria for high risk Diagnosis Code(s):        --- Professional ---                           Z12.11,  Encounter for screening for malignant                            neoplasm of colon                           K64.8, Other hemorrhoids                           K57.30, Diverticulosis of large intestine without                            perforation or abscess without bleeding CPT copyright 2019 American Medical Association. All rights reserved. The codes documented in this report are preliminary and upon coder review may  be revised to meet current compliance requirements. Elon Alas. Abbey Chatters, DO [place] Abbey Chatters, DO 02/05/2021 11:18:32 AM This report has been signed electronically. Number of Addenda: 0

## 2021-02-05 NOTE — Transfer of Care (Signed)
Immediate Anesthesia Transfer of Care Note  Patient: Judy Davis  Procedure(s) Performed: COLONOSCOPY WITH PROPOFOL (N/A )  Patient Location: Short Stay  Anesthesia Type:General  Level of Consciousness: awake, alert , oriented and patient cooperative  Airway & Oxygen Therapy: Patient Spontanous Breathing  Post-op Assessment: Report given to RN, Post -op Vital signs reviewed and stable and Patient moving all extremities  Post vital signs: Reviewed and stable  Last Vitals:  Vitals Value Taken Time  BP    Temp    Pulse    Resp    SpO2      Last Pain:  Vitals:   02/05/21 1055  TempSrc:   PainSc: 0-No pain         Complications: No complications documented.

## 2021-02-05 NOTE — Anesthesia Preprocedure Evaluation (Signed)
Anesthesia Evaluation  Patient identified by MRN, date of birth, ID band Patient awake    Reviewed: Allergy & Precautions, NPO status , Patient's Chart, lab work & pertinent test results, reviewed documented beta blocker date and time   History of Anesthesia Complications Negative for: history of anesthetic complications  Airway Mallampati: II  TM Distance: >3 FB Neck ROM: Full    Dental  (+) Dental Advisory Given, Missing   Pulmonary    Pulmonary exam normal breath sounds clear to auscultation       Cardiovascular hypertension, Pt. on medications and Pt. on home beta blockers Normal cardiovascular exam Rhythm:Regular Rate:Normal     Neuro/Psych PSYCHIATRIC DISORDERS  Neuromuscular disease    GI/Hepatic GERD  Medicated and Controlled,(+)     substance abuse (h/o alcohol abuse)  alcohol use,   Endo/Other  Hypothyroidism   Renal/GU Renal disease (AKI)     Musculoskeletal negative musculoskeletal ROS (+)   Abdominal   Peds negative pediatric ROS (+)  Hematology  (+) anemia ,   Anesthesia Other Findings   Reproductive/Obstetrics negative OB ROS                            Anesthesia Physical Anesthesia Plan  ASA: III  Anesthesia Plan: General   Post-op Pain Management:    Induction: Intravenous  PONV Risk Score and Plan: Propofol infusion  Airway Management Planned: Nasal Cannula and Natural Airway  Additional Equipment:   Intra-op Plan:   Post-operative Plan:   Informed Consent: I have reviewed the patients History and Physical, chart, labs and discussed the procedure including the risks, benefits and alternatives for the proposed anesthesia with the patient or authorized representative who has indicated his/her understanding and acceptance.     Dental advisory given  Plan Discussed with: CRNA and Surgeon  Anesthesia Plan Comments:         Anesthesia Quick  Evaluation

## 2021-02-05 NOTE — Anesthesia Postprocedure Evaluation (Signed)
Anesthesia Post Note  Patient: Judy Davis  Procedure(s) Performed: COLONOSCOPY WITH PROPOFOL (N/A )  Patient location during evaluation: Phase II Anesthesia Type: General Level of consciousness: awake and alert and oriented Pain management: pain level controlled Vital Signs Assessment: post-procedure vital signs reviewed and stable Respiratory status: spontaneous breathing and respiratory function stable Cardiovascular status: blood pressure returned to baseline and stable Postop Assessment: no apparent nausea or vomiting Anesthetic complications: no   No complications documented.   Last Vitals:  Vitals:   02/05/21 0940 02/05/21 1119  BP: (!) 180/83 (!) 145/82  Pulse: 87   Resp:  14  Temp: 36.6 C 36.6 C  SpO2: 99% 99%    Last Pain:  Vitals:   02/05/21 1119  TempSrc: Oral  PainSc:                  Ilia Dimaano C Junior Kenedy

## 2021-02-06 ENCOUNTER — Telehealth: Payer: Self-pay

## 2021-02-06 NOTE — Telephone Encounter (Signed)
Patient seen in 11/2020 and did not have an understanding of what medications she was on. Pharmacy reported that she was not actively getting any medications filled.   She had endo with Dr. Abbey Chatters yesterday and he advised she resume her medications.   She will need to follow up with PCP, [facility] is listed. And they can get her back on her medications.

## 2021-02-06 NOTE — Telephone Encounter (Signed)
FYI: The pt came by the office today to show me documentation from her hospital visit where she isn't on any medications. I advised the pt that she has to go to her PCP and get started back on all the medications she was on. Pt thought we were her PCP and I had to tell her otherwise. So hopefully she went to see her PCP and scheduled appt with them to be evaluated and to get back on her medications that she needs.

## 2021-02-06 NOTE — Telephone Encounter (Signed)
noted 

## 2021-02-13 ENCOUNTER — Encounter (HOSPITAL_COMMUNITY): Payer: Self-pay | Admitting: Internal Medicine

## 2021-02-21 ENCOUNTER — Encounter: Payer: Self-pay | Admitting: *Deleted

## 2021-04-21 ENCOUNTER — Inpatient Hospital Stay (HOSPITAL_COMMUNITY)
Admission: EM | Admit: 2021-04-21 | Discharge: 2021-04-29 | DRG: 522 | Disposition: A | Discharge status: Skilled Nursing Facility | Payer: Medicare Other | Attending: Internal Medicine | Admitting: Internal Medicine

## 2021-04-21 ENCOUNTER — Emergency Department (HOSPITAL_COMMUNITY): Payer: Medicare Other

## 2021-04-21 ENCOUNTER — Encounter (HOSPITAL_COMMUNITY): Payer: Self-pay

## 2021-04-21 ENCOUNTER — Other Ambulatory Visit: Payer: Self-pay

## 2021-04-21 DIAGNOSIS — K219 Gastro-esophageal reflux disease without esophagitis: Secondary | ICD-10-CM | POA: Diagnosis present

## 2021-04-21 DIAGNOSIS — R2681 Unsteadiness on feet: Secondary | ICD-10-CM | POA: Diagnosis not present

## 2021-04-21 DIAGNOSIS — Z7982 Long term (current) use of aspirin: Secondary | ICD-10-CM | POA: Diagnosis not present

## 2021-04-21 DIAGNOSIS — Z6821 Body mass index (BMI) 21.0-21.9, adult: Secondary | ICD-10-CM

## 2021-04-21 DIAGNOSIS — I10 Essential (primary) hypertension: Secondary | ICD-10-CM | POA: Diagnosis present

## 2021-04-21 DIAGNOSIS — Z90710 Acquired absence of both cervix and uterus: Secondary | ICD-10-CM

## 2021-04-21 DIAGNOSIS — E785 Hyperlipidemia, unspecified: Secondary | ICD-10-CM | POA: Diagnosis present

## 2021-04-21 DIAGNOSIS — B9620 Unspecified Escherichia coli [E. coli] as the cause of diseases classified elsewhere: Secondary | ICD-10-CM | POA: Diagnosis present

## 2021-04-21 DIAGNOSIS — S72002A Fracture of unspecified part of neck of left femur, initial encounter for closed fracture: Secondary | ICD-10-CM | POA: Diagnosis present

## 2021-04-21 DIAGNOSIS — Z419 Encounter for procedure for purposes other than remedying health state, unspecified: Secondary | ICD-10-CM

## 2021-04-21 DIAGNOSIS — K860 Alcohol-induced chronic pancreatitis: Secondary | ICD-10-CM

## 2021-04-21 DIAGNOSIS — Y92018 Other place in single-family (private) house as the place of occurrence of the external cause: Secondary | ICD-10-CM

## 2021-04-21 DIAGNOSIS — Z20822 Contact with and (suspected) exposure to covid-19: Secondary | ICD-10-CM | POA: Diagnosis present

## 2021-04-21 DIAGNOSIS — E876 Hypokalemia: Secondary | ICD-10-CM | POA: Diagnosis not present

## 2021-04-21 DIAGNOSIS — E871 Hypo-osmolality and hyponatremia: Secondary | ICD-10-CM | POA: Diagnosis present

## 2021-04-21 DIAGNOSIS — D62 Acute posthemorrhagic anemia: Secondary | ICD-10-CM | POA: Diagnosis present

## 2021-04-21 DIAGNOSIS — E860 Dehydration: Secondary | ICD-10-CM | POA: Diagnosis present

## 2021-04-21 DIAGNOSIS — D649 Anemia, unspecified: Secondary | ICD-10-CM | POA: Diagnosis present

## 2021-04-21 DIAGNOSIS — F1010 Alcohol abuse, uncomplicated: Secondary | ICD-10-CM | POA: Diagnosis not present

## 2021-04-21 DIAGNOSIS — W1830XA Fall on same level, unspecified, initial encounter: Secondary | ICD-10-CM | POA: Diagnosis present

## 2021-04-21 DIAGNOSIS — K861 Other chronic pancreatitis: Secondary | ICD-10-CM | POA: Diagnosis present

## 2021-04-21 DIAGNOSIS — N390 Urinary tract infection, site not specified: Secondary | ICD-10-CM | POA: Diagnosis present

## 2021-04-21 DIAGNOSIS — E039 Hypothyroidism, unspecified: Secondary | ICD-10-CM | POA: Diagnosis present

## 2021-04-21 DIAGNOSIS — Z809 Family history of malignant neoplasm, unspecified: Secondary | ICD-10-CM

## 2021-04-21 DIAGNOSIS — R739 Hyperglycemia, unspecified: Secondary | ICD-10-CM | POA: Diagnosis present

## 2021-04-21 DIAGNOSIS — Z96642 Presence of left artificial hip joint: Secondary | ICD-10-CM

## 2021-04-21 DIAGNOSIS — R2689 Other abnormalities of gait and mobility: Secondary | ICD-10-CM | POA: Diagnosis present

## 2021-04-21 DIAGNOSIS — Y9301 Activity, walking, marching and hiking: Secondary | ICD-10-CM | POA: Diagnosis present

## 2021-04-21 DIAGNOSIS — Z79890 Hormone replacement therapy (postmenopausal): Secondary | ICD-10-CM

## 2021-04-21 DIAGNOSIS — E440 Moderate protein-calorie malnutrition: Secondary | ICD-10-CM | POA: Diagnosis present

## 2021-04-21 DIAGNOSIS — S72002D Fracture of unspecified part of neck of left femur, subsequent encounter for closed fracture with routine healing: Secondary | ICD-10-CM | POA: Diagnosis not present

## 2021-04-21 DIAGNOSIS — E559 Vitamin D deficiency, unspecified: Secondary | ICD-10-CM | POA: Diagnosis present

## 2021-04-21 DIAGNOSIS — Z79899 Other long term (current) drug therapy: Secondary | ICD-10-CM | POA: Diagnosis not present

## 2021-04-21 DIAGNOSIS — W19XXXA Unspecified fall, initial encounter: Secondary | ICD-10-CM

## 2021-04-21 DIAGNOSIS — M25552 Pain in left hip: Secondary | ICD-10-CM | POA: Diagnosis present

## 2021-04-21 LAB — CBC WITH DIFFERENTIAL/PLATELET
Abs Immature Granulocytes: 0.05 10*3/uL (ref 0.00–0.07)
Basophils Absolute: 0 10*3/uL (ref 0.0–0.1)
Basophils Relative: 0 %
Eosinophils Absolute: 0 10*3/uL (ref 0.0–0.5)
Eosinophils Relative: 0 %
HCT: 34.3 % — ABNORMAL LOW (ref 36.0–46.0)
Hemoglobin: 11.1 g/dL — ABNORMAL LOW (ref 12.0–15.0)
Immature Granulocytes: 1 %
Lymphocytes Relative: 12 %
Lymphs Abs: 1.2 10*3/uL (ref 0.7–4.0)
MCH: 31.4 pg (ref 26.0–34.0)
MCHC: 32.4 g/dL (ref 30.0–36.0)
MCV: 96.9 fL (ref 80.0–100.0)
Monocytes Absolute: 1 10*3/uL (ref 0.1–1.0)
Monocytes Relative: 10 %
Neutro Abs: 7.4 10*3/uL (ref 1.7–7.7)
Neutrophils Relative %: 77 %
Platelets: 270 10*3/uL (ref 150–400)
RBC: 3.54 MIL/uL — ABNORMAL LOW (ref 3.87–5.11)
RDW: 12.8 % (ref 11.5–15.5)
WBC: 9.7 10*3/uL (ref 4.0–10.5)
nRBC: 0 % (ref 0.0–0.2)

## 2021-04-21 LAB — TYPE AND SCREEN
ABO/RH(D): O POS
Antibody Screen: NEGATIVE

## 2021-04-21 LAB — BASIC METABOLIC PANEL
Anion gap: 7 (ref 5–15)
BUN: 10 mg/dL (ref 8–23)
CO2: 28 mmol/L (ref 22–32)
Calcium: 9.5 mg/dL (ref 8.9–10.3)
Chloride: 92 mmol/L — ABNORMAL LOW (ref 98–111)
Creatinine, Ser: 0.68 mg/dL (ref 0.44–1.00)
GFR, Estimated: >60 mL/min (ref >60)
Glucose, Bld: 122 mg/dL — ABNORMAL HIGH (ref 70–99)
Potassium: 3.8 mmol/L (ref 3.5–5.1)
Sodium: 127 mmol/L — ABNORMAL LOW (ref 135–145)

## 2021-04-21 LAB — RESP PANEL BY RT-PCR (FLU A&B, COVID) ARPGX2
Influenza A by PCR: NEGATIVE
Influenza B by PCR: NEGATIVE
SARS Coronavirus 2 by RT PCR: NEGATIVE

## 2021-04-21 LAB — PROTIME-INR
INR: 1.2 (ref 0.8–1.2)
Prothrombin Time: 14.8 seconds (ref 11.4–15.2)

## 2021-04-21 LAB — HIV ANTIBODY (ROUTINE TESTING W REFLEX): HIV Screen 4th Generation wRfx: NONREACTIVE

## 2021-04-21 LAB — VITAMIN D 25 HYDROXY (VIT D DEFICIENCY, FRACTURES): Vit D, 25-Hydroxy: 17.43 ng/mL — ABNORMAL LOW (ref 30–100)

## 2021-04-21 LAB — ETHANOL: Alcohol, Ethyl (B): <10 mg/dL (ref <10)

## 2021-04-21 MED ORDER — FOLIC ACID 1 MG PO TABS
1.0000 mg | ORAL_TABLET | Freq: Every day | ORAL | Status: DC
  Administered 2021-04-22 – 2021-04-29 (×8): 1 mg via ORAL
  Filled 2021-04-21 (×8): qty 1

## 2021-04-21 MED ORDER — THIAMINE HCL 100 MG PO TABS
100.0000 mg | ORAL_TABLET | Freq: Every day | ORAL | Status: DC
  Administered 2021-04-22 – 2021-04-29 (×8): 100 mg via ORAL
  Filled 2021-04-21 (×8): qty 1

## 2021-04-21 MED ORDER — THIAMINE HCL 100 MG/ML IJ SOLN
100.0000 mg | Freq: Every day | INTRAMUSCULAR | Status: DC
  Filled 2021-04-21 (×5): qty 2

## 2021-04-21 MED ORDER — HYDRALAZINE HCL 20 MG/ML IJ SOLN
10.0000 mg | INTRAMUSCULAR | Status: DC | PRN
  Administered 2021-04-21: 10 mg via INTRAVENOUS
  Filled 2021-04-21: qty 1

## 2021-04-21 MED ORDER — MORPHINE SULFATE (PF) 2 MG/ML IV SOLN
0.5000 mg | INTRAVENOUS | Status: DC | PRN
  Administered 2021-04-21 – 2021-04-22 (×5): 0.5 mg via INTRAVENOUS
  Filled 2021-04-21 (×5): qty 1

## 2021-04-21 MED ORDER — THIAMINE HCL 100 MG PO TABS: 100.0000 mg | ORAL_TABLET | Freq: Every day | ORAL | Status: DC

## 2021-04-21 MED ORDER — HYDROCODONE-ACETAMINOPHEN 5-325 MG PO TABS: 1.0000 | ORAL_TABLET | Freq: Four times a day (QID) | ORAL | Status: DC | PRN

## 2021-04-21 MED ORDER — ADULT MULTIVITAMIN W/MINERALS CH
1.0000 | ORAL_TABLET | Freq: Every day | ORAL | Status: DC
  Administered 2021-04-22 – 2021-04-29 (×8): 1 via ORAL
  Filled 2021-04-21 (×8): qty 1

## 2021-04-21 MED ORDER — FENTANYL CITRATE PF 50 MCG/ML IJ SOSY
50.0000 ug | PREFILLED_SYRINGE | INTRAMUSCULAR | Status: DC | PRN
  Administered 2021-04-21: 50 ug via INTRAVENOUS
  Filled 2021-04-21: qty 1

## 2021-04-21 MED ORDER — LORAZEPAM 1 MG PO TABS: 0.0000 mg | ORAL_TABLET | Freq: Two times a day (BID) | ORAL | Status: DC

## 2021-04-21 MED ORDER — LORAZEPAM 2 MG/ML IJ SOLN
1.0000 mg | INTRAMUSCULAR | Status: AC | PRN
  Filled 2021-04-21: qty 1

## 2021-04-21 MED ORDER — HEPARIN SODIUM (PORCINE) 5000 UNIT/ML IJ SOLN
5000.0000 [IU] | Freq: Three times a day (TID) | INTRAMUSCULAR | Status: AC
  Administered 2021-04-22: 5000 [IU] via SUBCUTANEOUS
  Filled 2021-04-21: qty 1

## 2021-04-21 MED ORDER — LEVOTHYROXINE SODIUM 50 MCG PO TABS
50.0000 ug | ORAL_TABLET | Freq: Every day | ORAL | Status: DC
  Administered 2021-04-23 – 2021-04-29 (×7): 50 ug via ORAL
  Filled 2021-04-21 (×7): qty 1

## 2021-04-21 MED ORDER — LORAZEPAM 2 MG/ML IJ SOLN: 0.0000 mg | Freq: Two times a day (BID) | INTRAMUSCULAR | Status: DC

## 2021-04-21 MED ORDER — LORAZEPAM 1 MG PO TABS: 1.0000 mg | ORAL_TABLET | ORAL | Status: AC | PRN

## 2021-04-21 MED ORDER — POLYETHYLENE GLYCOL 3350 17 G PO PACK: 17.0000 g | PACK | Freq: Every day | ORAL | Status: DC | PRN

## 2021-04-21 MED ORDER — LORAZEPAM 2 MG/ML IJ SOLN: 0.0000 mg | Freq: Four times a day (QID) | INTRAMUSCULAR | Status: DC

## 2021-04-21 MED ORDER — THIAMINE HCL 100 MG/ML IJ SOLN
100.0000 mg | Freq: Every day | INTRAMUSCULAR | Status: DC
  Administered 2021-04-21: 100 mg via INTRAVENOUS
  Filled 2021-04-21: qty 2

## 2021-04-21 MED ORDER — LORAZEPAM 2 MG/ML IJ SOLN
0.0000 mg | Freq: Four times a day (QID) | INTRAMUSCULAR | Status: AC
Start: 2021-04-21 — End: 2021-04-23
  Administered 2021-04-21 – 2021-04-22 (×2): 1 mg via INTRAVENOUS
  Filled 2021-04-21: qty 1

## 2021-04-21 MED ORDER — METOPROLOL TARTRATE 50 MG PO TABS
50.0000 mg | ORAL_TABLET | Freq: Two times a day (BID) | ORAL | Status: DC
  Administered 2021-04-21 – 2021-04-29 (×16): 50 mg via ORAL
  Filled 2021-04-21 (×16): qty 1

## 2021-04-21 MED ORDER — SODIUM CHLORIDE 0.9 % IV SOLN: INTRAVENOUS | Status: DC

## 2021-04-21 MED ORDER — ROSUVASTATIN CALCIUM 5 MG PO TABS
5.0000 mg | ORAL_TABLET | Freq: Every evening | ORAL | Status: DC
  Administered 2021-04-21 – 2021-04-27 (×6): 5 mg via ORAL
  Filled 2021-04-21 (×6): qty 1

## 2021-04-21 MED ORDER — LORAZEPAM 1 MG PO TABS: 0.0000 mg | ORAL_TABLET | Freq: Four times a day (QID) | ORAL | Status: DC

## 2021-04-21 MED ORDER — ONDANSETRON HCL 4 MG/2ML IJ SOLN
4.0000 mg | Freq: Four times a day (QID) | INTRAMUSCULAR | Status: DC | PRN
  Administered 2021-04-21: 4 mg via INTRAVENOUS
  Filled 2021-04-21: qty 2

## 2021-04-21 MED ORDER — LORAZEPAM 2 MG/ML IJ SOLN
0.0000 mg | Freq: Two times a day (BID) | INTRAMUSCULAR | Status: AC
  Administered 2021-04-23: 2 mg via INTRAVENOUS
  Administered 2021-04-24: 1 mg via INTRAVENOUS
  Filled 2021-04-21 (×2): qty 1

## 2021-04-21 MED ORDER — TRANEXAMIC ACID-NACL 1000-0.7 MG/100ML-% IV SOLN
1000.0000 mg | Freq: Once | INTRAVENOUS | Status: DC
  Filled 2021-04-21: qty 100

## 2021-04-21 NOTE — ED Notes (Signed)
Pt requesting pain medicine, hip pain 9/10

## 2021-04-21 NOTE — Progress Notes (Signed)
Left femoral neck fracture in ambulatory patient  Medical w/u in progress  [facility] when medically optimized and admission w/u complete

## 2021-04-21 NOTE — ED Triage Notes (Signed)
Patient was ambulating to kitchen and fell landing on left side. Complaining of left leg, hip, and knee pain.

## 2021-04-21 NOTE — ED Notes (Signed)
Patient in CT at this time.

## 2021-04-21 NOTE — ED Provider Notes (Signed)
[facility] EMERGENCY DEPARTMENT Provider Note   CSN: [id number] Arrival date & time: 04/21/21  0733     History Chief Complaint  Patient presents with   Judy Davis is a 65 y.o. female.   Fall  This patient is a 65 year old female, she has a history of alcohol use hypertension hyperlipidemia and hypothyroidism, she has been diagnosed with chronic pancreatitis and has some gait instability per my review of the medical record.  She presents after having a fall approximately 24 hours ago at her home, she was walking out of the kitchen when she fell and struck the ground, she states that she almost hit the wood stove that she was falling to the ground but was able to miss it, she did not injure her head or her neck and has no other injuries other than her left hip and proximal thigh.  Her family members were at home and helped lift her onto the couch where she has been for the last 24 hours.  She reports that her last food was 2 days ago.  She arrives by paramedic transport, she was nonambulatory at home    Past Medical History:  Diagnosis Date   ETOH abuse    HTN (hypertension)    Hyperlipidemia    Hypothyroidism     Patient Active Problem List   Diagnosis Date Noted   Closed left hip fracture ([place]) 04/21/2021   Hypothyroidism    Hyperlipidemia    Chronic pancreatitis ([place]) 12/10/2020   Encounter for screening colonoscopy 12/10/2020   Elevated CA 19-9 level 07/18/2020   Acute kidney injury ([place])    Severe Vitamin D deficiency 02/28/2020   Malnutrition of moderate degree 02/28/2020   Generalized abdominal pain 02/27/2020   GERD (gastroesophageal reflux disease) 02/27/2020   Altered mental state 02/27/2020   Sinus tachycardia 02/27/2020   Acute on chronic pancreatitis ([place]) 02/27/2020   Pancreatic mass 02/27/2020   Pancreatic lesion 02/27/2020   Gait instability 08/10/2019   Alcohol abuse 08/08/2019   Intractable nausea and vomiting 08/08/2019   Intractable  vomiting 08/07/2019   Hematoma 09/29/2016   Elevated AST (SGOT) 09/29/2016   Hyperglycemia 09/29/2016   Hypokalemia 09/29/2016   Upper respiratory infection 07/06/2011   Lower urinary tract infectious disease 07/06/2011   Nausea and vomiting 07/05/2011   Hyponatremia 07/05/2011   HTN (hypertension) 07/05/2011   Anemia 07/05/2011    Past Surgical History:  Procedure Laterality Date   ABDOMINAL HYSTERECTOMY  1970's   COLONOSCOPY WITH PROPOFOL N/A 02/05/2021   Procedure: COLONOSCOPY WITH PROPOFOL;  Surgeon: [name], DO;  [facility];  Service: Endoscopy;  Laterality: N/A;  AM   ESOPHAGOGASTRODUODENOSCOPY (EGD) WITH PROPOFOL N/A 08/09/2019   Fields: LA Grade C esophagitis. gastritis. no h.pylori     OB History   No obstetric history on file.     Family History  Problem Relation Age of Onset   Other Mother        Homicide   Cancer Father        possibly pancreatic   Colon cancer Neg Hx    Colon polyps Neg Hx     Social History   Tobacco Use   Smoking status: Never   Smokeless tobacco: Never  Vaping Use   Vaping Use: Never used  Substance Use Topics   Alcohol use: Yes    Comment: occ 1 beer/day   Drug use: No    Home Medications Prior to Admission medications  Medication Sig Start Date End Date Taking? Authorizing Provider  levothyroxine (SYNTHROID) 50 MCG tablet Take 50 mcg by mouth daily before breakfast.   Yes [provider]  metoprolol tartrate (LOPRESSOR) 50 MG tablet Take 50 mg by mouth 2 (two) times daily.   Yes [provider]  rosuvastatin (CRESTOR) 5 MG tablet Take 5 mg by mouth daily.   Yes [provider]    Allergies    Patient has no known allergies.  Review of Systems   Review of Systems  All other systems reviewed and are negative.  Physical Exam Updated Vital Signs BP (!) 154/89   Pulse 87   Temp 98.3 F (36.8 C) (Oral)   Resp 16   Ht 1.676 m ('5\' 6"'$ )   Wt 60.3 kg   SpO2 100%   BMI  21.47 kg/m   Physical Exam Vitals and nursing note reviewed.  Constitutional:      General: She is not in acute distress.    Appearance: She is well-developed.  HENT:     Head: Normocephalic and atraumatic.     Mouth/Throat:     Pharynx: No oropharyngeal exudate.  Eyes:     General: No scleral icterus.       Right eye: No discharge.        Left eye: No discharge.     Conjunctiva/sclera: Conjunctivae normal.     Pupils: Pupils are equal, round, and reactive to light.  Neck:     Thyroid: No thyromegaly.     Vascular: No JVD.  Cardiovascular:     Rate and Rhythm: Normal rate and regular rhythm.     Heart sounds: Normal heart sounds. No murmur heard.   No friction rub. No gallop.  Pulmonary:     Effort: Pulmonary effort is normal. No respiratory distress.     Breath sounds: Normal breath sounds. No wheezing or rales.  Abdominal:     General: Bowel sounds are normal. There is no distension.     Palpations: Abdomen is soft. There is no mass.     Tenderness: There is no abdominal tenderness.  Musculoskeletal:        General: Tenderness and deformity present.     Cervical back: Normal range of motion and neck supple.     Comments: Bilateral upper extremities without injury, right lower extremity without injury, left lower extremity has a leg length discrepancy it is shortened and externally rotated and has tenderness with any range of motion of the hip, there is no obvious deformities or tenderness to the knee ankle on the left side  Lymphadenopathy:     Cervical: No cervical adenopathy.  Skin:    General: Skin is warm and dry.     Findings: No erythema or rash.  Neurological:     Mental Status: She is alert.     Coordination: Coordination normal.  Psychiatric:        Behavior: Behavior normal.    ED Results / Procedures / Treatments   Labs (all labs ordered are listed, but only abnormal results are displayed) Labs Reviewed  BASIC METABOLIC PANEL - Abnormal; Notable for  the following components:      Result Value   Sodium 127 (*)    Chloride 92 (*)    Glucose, Bld 122 (*)    All other components within normal limits  CBC WITH DIFFERENTIAL/PLATELET - Abnormal; Notable for the following components:   RBC 3.54 (*)    Hemoglobin 11.1 (*)  HCT 34.3 (*)    All other components within normal limits  RESP PANEL BY RT-PCR (FLU A&B, COVID) [id number]  PROTIME-INR  ETHANOL  TYPE AND SCREEN    EKG EKG Interpretation  Date/Time:  Sunday April 21 2021 07:44:50 EDT Ventricular Rate:  90 PR Interval:  151 QRS Duration: 98 QT Interval:  400 QTC Calculation: 490 R Axis:   52 Text Interpretation: Sinus rhythm Borderline prolonged QT interval ECG OTHERWISE WITHIN NORMAL LIMITS Confirmed by Noemi Chapel [phone]) on 04/21/2021 8:46:24 AM  Radiology DG Chest 1 View  Result Date: 04/21/2021 CLINICAL DATA:  LEFT hip fracture. EXAM: CHEST  1 VIEW COMPARISON:  02/28/2020 FINDINGS: The cardiomediastinal silhouette is unremarkable. There is no evidence of focal airspace disease, pulmonary edema, suspicious pulmonary nodule/mass, pleural effusion, or pneumothorax. No acute bony abnormalities are identified. Remote LEFT rib fractures are again identified. IMPRESSION: No active disease. Electronically Signed   By: Margarette Canada M.D.   On: 04/21/2021 09:48   DG Hip Unilat With Pelvis 2-3 Views Left  Result Date: 04/21/2021 CLINICAL DATA:  Acute LEFT hip pain following fall. Initial encounter. EXAM: DG HIP (WITH OR WITHOUT PELVIS) 2-3V LEFT COMPARISON:  None. FINDINGS: A LEFT femoral neck fracture is identified with 2 cm SUPERIOR displacement and varus angulation. There is no evidence of dislocation. No other significant bony abnormalities are noted. IMPRESSION: LEFT femoral neck fracture with 2 cm SUPERIOR displacement and varus angulation. Electronically Signed   By: Margarette Canada M.D.   On: 04/21/2021 09:47    Procedures Procedures   Medications Ordered in ED Medications  0.9 %   sodium chloride infusion ( Intravenous New Bag/Given 04/21/21 0843)  fentaNYL (SUBLIMAZE) injection 50 mcg (50 mcg Intravenous Given 04/21/21 0841)  ondansetron (ZOFRAN) injection 4 mg (4 mg Intravenous Given 04/21/21 0841)  LORazepam (ATIVAN) injection 0-4 mg (0 mg Intravenous Not Given 04/21/21 0844)    Or  LORazepam (ATIVAN) tablet 0-4 mg ( Oral See Alternative 04/21/21 0844)  LORazepam (ATIVAN) injection 0-4 mg (has no administration in time range)    Or  LORazepam (ATIVAN) tablet 0-4 mg (has no administration in time range)  thiamine tablet 100 mg ( Oral See Alternative 04/21/21 0847)    Or  thiamine (B-1) injection 100 mg (100 mg Intravenous Given 04/21/21 0847)    ED Course  I have reviewed the triage vital signs and the nursing notes.  Pertinent labs & imaging results that were available during my care of the patient were reviewed by me and considered in my medical decision making (see chart for details).  Clinical Course as of 04/21/21 1037  Sun Apr 21, 2021  1011 Labs are reassuring - Na slightly low though. Covid negative, CXR negative Hip xray confirms hip fractures  Discussed with Dr. Marlou Sa - [facility] - will page hospitalist for admission and to coordinate the transfer [BM]    Clinical Course User Index [BM] Noemi Chapel, MD   MDM Rules/Calculators/A&P                           This patient appears uncomfortable with pain secondary to hip pain, I suspect she has a hip fracture given the deformity of the leg and the shortening and rotation.  Her vital signs are unremarkable otherwise except for some hypertension.  She will be given pain medication and the hip fracture order set will be used to further evaluate the cause of her  pain, she is n.p.o., fentanyl ordered as needed  The patient does not fact have a hip fracture, discussed with the hospitalist Dr. Wynetta Emery who has been kind enough to see this patient and facilitate transfer to another hospital  for surgery  Final Clinical Impression(s) / ED Diagnoses Final diagnoses:  Closed fracture of left hip, initial encounter [facility])    Rx / DC Orders ED Discharge Orders     None        Noemi Chapel, MD 04/21/21 1037

## 2021-04-21 NOTE — H&P (Signed)
History and Physical  [facility]  Judy Davis [id number] DOB: 1956-01-29 DOA: 04/21/2021  PCP: [facility] [place]  Patient coming from: Home  Level of care: Med-Surg  I have personally briefly reviewed patient's old medical records in [place]  Chief Complaint: Fall  HPI: Judy Davis is a 65 y.o. female with medical history significant for chronic alcoholism (last drink was 2 weeks ago), vitamin D deficiency, essential hypertension, chronic hyponatremia, hyperlipidemia, gait instability and moderate to severe malnutrition reportedly fell in her kitchen yesterday tripping over her feet. She denies that alcohol was involved.  She lives with husband.  She says that after falling she was unable to get up.  She denies hitting her head.  She reports that family lifter her onto the couch where she remained for 24 hours.  She last ate 2 days ago.  She continued to complain of left hip and thigh pain and inability to ambulate.  EMS brought her to the ED for further eval.   ED Course: Temperature 98.3, pulse rate 94, blood pressure 172/94, pulse ox 100% on room air, sodium 127, potassium 3.8, glucose 122, creatinine 0.68, hemoglobin 11.1, WBC 9.7, platelet count 270.  Ethyl alcohol level less than 10.  Chest x-ray no acute findings.  X-ray of left hip with findings of left femoral neck fracture.  EKG normal sinus rhythm.  Orthopedics Dr. Marlou Sa was consulted and requested patient be admitted to [facility] for total hip arthropathy.  Patient is NPO.  Admission was requested.  Review of Systems: Review of Systems  Constitutional:  Positive for malaise/fatigue. Negative for fever.  HENT: Negative.    Eyes: Negative.   Respiratory: Negative.    Gastrointestinal: Negative.   Genitourinary: Negative.   Musculoskeletal:  Positive for falls and joint pain.  Skin: Negative.   Neurological:  Positive for weakness. Negative for dizziness, sensory change and  headaches.  Endo/Heme/Allergies: Negative.   Psychiatric/Behavioral: Negative.    All other systems reviewed and are negative.   Past Medical History:  Diagnosis Date   ETOH abuse    HTN (hypertension)    Hyperlipidemia    Hypothyroidism     Past Surgical History:  Procedure Laterality Date   ABDOMINAL HYSTERECTOMY  1970's   COLONOSCOPY WITH PROPOFOL N/A 02/05/2021   Procedure: COLONOSCOPY WITH PROPOFOL;  Surgeon: [name], DO;  [facility];  Service: Endoscopy;  Laterality: N/A;  AM   ESOPHAGOGASTRODUODENOSCOPY (EGD) WITH PROPOFOL N/A 08/09/2019   Fields: LA Grade C esophagitis. gastritis. no h.pylori     reports that she has never smoked. She has never used smokeless tobacco. She reports current alcohol use. She reports that she does not use drugs.  No Known Allergies  Family History  Problem Relation Age of Onset   Other Mother        Homicide   Cancer Father        possibly pancreatic   Colon cancer Neg Hx    Colon polyps Neg Hx     Prior to Admission medications   Medication Sig Start Date End Date Taking? Authorizing Provider  levothyroxine (SYNTHROID) 50 MCG tablet Take 50 mcg by mouth daily before breakfast.   Yes [provider]  metoprolol tartrate (LOPRESSOR) 50 MG tablet Take 50 mg by mouth 2 (two) times daily.   Yes [provider]  rosuvastatin (CRESTOR) 5 MG tablet Take 5 mg by mouth daily.   Yes [provider]  Physical Exam: Vitals:   04/21/21 0836 04/21/21 0900 04/21/21 0930 04/21/21 1000  BP: (!) 175/94 (!) 169/85 (!) 178/86 (!) 154/89  Pulse:  89 78 87  Resp:  '13 16 16  '$ Temp:      TempSrc:      SpO2:  96% 99% 100%  Weight:      Height:       Constitutional: Frail emaciated appearing female awake and alert NAD, calm, appears uncomfortable. Eyes: PERRL, lids and conjunctivae normal, no scleral icterus ENMT: Mucous membranes are dry. Posterior pharynx clear of any exudate or lesions.poor  dentition.  Neck: normal, supple, no masses, no thyromegaly Respiratory: clear to auscultation bilaterally, no wheezing, no crackles. Normal respiratory effort. No accessory muscle use.  Cardiovascular: normal s1, s2 sounds, no murmurs / rubs / gallops. No extremity edema. 2+ pedal pulses. No carotid bruits.  Abdomen: no tenderness, no masses palpated. No hepatosplenomegaly. Bowel sounds positive.  Musculoskeletal: Left leg appears shortened and externally rotated.  Tender to palpation.  Pedal pulses palpated bilateral.  Warm lower extremities. Skin: no rashes, lesions, ulcers. No induration Neurologic: CN 2-12 grossly intact. Sensation intact, DTR normal. Strength 5/5 in all 4.  Psychiatric: Normal judgment and insight. Alert and oriented x 3. Normal mood.   Labs on Admission: I have personally reviewed following labs and imaging studies  CBC: Recent Labs  Lab 04/21/21 0841  WBC 9.7  NEUTROABS 7.4  HGB 11.1*  HCT 34.3*  MCV 96.9  PLT [id number]   Basic Metabolic Panel: Recent Labs  Lab 04/21/21 0841  NA 127*  K 3.8  CL 92*  CO2 28  GLUCOSE 122*  BUN 10  CREATININE 0.68  CALCIUM 9.5   GFR: Estimated Creatinine Clearance: 64.8 mL/min (by C-G formula based on SCr of 0.68 mg/dL). Liver Function Tests: No results for input(s): AST, ALT, ALKPHOS, BILITOT, PROT, ALBUMIN in the last 168 hours. No results for input(s): LIPASE, AMYLASE in the last 168 hours. No results for input(s): AMMONIA in the last 168 hours. Coagulation Profile: Recent Labs  Lab 04/21/21 0841  INR 1.2   Cardiac Enzymes: No results for input(s): CKTOTAL, CKMB, CKMBINDEX, TROPONINI in the last 168 hours. BNP (last 3 results) No results for input(s): PROBNP in the last 8760 hours. HbA1C: No results for input(s): HGBA1C in the last 72 hours. CBG: No results for input(s): GLUCAP in the last 168 hours. Lipid Profile: No results for input(s): CHOL, HDL, LDLCALC, TRIG, CHOLHDL, LDLDIRECT in the last 72  hours. Thyroid Function Tests: No results for input(s): TSH, T4TOTAL, FREET4, T3FREE, THYROIDAB in the last 72 hours. Anemia Panel: No results for input(s): VITAMINB12, FOLATE, FERRITIN, TIBC, IRON, RETICCTPCT in the last 72 hours. Urine analysis:    Component Value Date/Time   COLORURINE AMBER (A) 02/27/2020 0502   APPEARANCEUR CLOUDY (A) 02/27/2020 0502   LABSPEC 1.012 02/27/2020 0502   PHURINE 7.0 02/27/2020 0502   GLUCOSEU NEGATIVE 02/27/2020 0502   HGBUR NEGATIVE 02/27/2020 0502   BILIRUBINUR NEGATIVE 02/27/2020 0502   KETONESUR NEGATIVE 02/27/2020 0502   PROTEINUR 30 (A) 02/27/2020 0502   UROBILINOGEN 0.2 05/10/2015 1000   NITRITE NEGATIVE 02/27/2020 0502   LEUKOCYTESUR TRACE (A) 02/27/2020 0502   Radiological Exams on Admission: DG Chest 1 View  Result Date: 04/21/2021 CLINICAL DATA:  LEFT hip fracture. EXAM: CHEST  1 VIEW COMPARISON:  02/28/2020 FINDINGS: The cardiomediastinal silhouette is unremarkable. There is no evidence of focal airspace disease, pulmonary edema, suspicious pulmonary nodule/mass, pleural effusion, or pneumothorax. No acute  bony abnormalities are identified. Remote LEFT rib fractures are again identified. IMPRESSION: No active disease. Electronically Signed   By: Margarette Canada M.D.   On: 04/21/2021 09:48   DG Hip Unilat With Pelvis 2-3 Views Left  Result Date: 04/21/2021 CLINICAL DATA:  Acute LEFT hip pain following fall. Initial encounter. EXAM: DG HIP (WITH OR WITHOUT PELVIS) 2-3V LEFT COMPARISON:  None. FINDINGS: A LEFT femoral neck fracture is identified with 2 cm SUPERIOR displacement and varus angulation. There is no evidence of dislocation. No other significant bony abnormalities are noted. IMPRESSION: LEFT femoral neck fracture with 2 cm SUPERIOR displacement and varus angulation. Electronically Signed   By: Margarette Canada M.D.   On: 04/21/2021 09:47    EKG: Independently reviewed.  Normal sinus rhythm  Assessment/Plan Principal Problem:   Closed left  hip fracture (HCC) Active Problems:   Hyponatremia   HTN (hypertension)   Anemia   Hyperglycemia   Alcohol abuse   Gait instability   GERD (gastroesophageal reflux disease)   Severe Vitamin D deficiency   Malnutrition of moderate degree   Chronic pancreatitis ([place])   Hypothyroidism   Hyperlipidemia   LEFT femoral neck fracture with 2 cm SUPERIOR displacement and varus angulation -patient being admitted to [facility] as requested by Dr. Marlou Sa orthopedics for THA timing to be decided by orthopedics.  Patient is NPO.  Hip fracture order set has been initiated.  IV pain medication ordered.  Bed rest.  Fall precautions ordered.    Hyponatremia-this is chronic for patient.  She is clinically dehydrated at this time and poor oral intake over the last 2 days.  I have ordered normal saline infusion to rehydrate.  Follow BMP.  GERD-Protonix ordered for GI protection.  History of alcohol abuse-patient reports last alcohol consumption was 2 weeks ago.  She is on a CIWA protocol but if she is telling the truth about her alcohol consumption she is not in danger of acute withdrawal at this time.  Severe vitamin D deficiency-not currently taking vitamin D supplement recheck 25-hydroxy vitamin D level.  Hypothyroidism-resume home levothyroxine.  Essential hypertension-poorly controlled likely exacerbated by pain.  Resume home blood pressure medications and pain medication initiated.  Acute blood loss anemia-likely secondary to acute hip fracture.  Follow CBC.  Hyperlipidemia-patient is taking rosuvastatin daily.  Fasting lipid panel in a.m.  History of chronic pancreatitis-patient reports no longer taking Creon supplements and reports she stopped alcohol use 2 weeks ago.  DVT prophylaxis: Subcu heparin Code Status: Full Family Communication: Sister at bedside Disposition Plan: Transfer to [place] called: Orthopedics with Dr. Marlou Sa Admission status: Inpatient Level of care:  Med-Surg Irwin Brakeman MD [facility] How to contact the [facility] Attending or Consulting provider [place] or covering provider during after hours [place], for this patient?  Check the care team in [facility] and look for a) attending/consulting [facility] provider listed and b) the [facility] team listed Log into [email or web address] and use [place]'s universal password to access. If you do not have the password, please contact the hospital operator. Locate the [facility], [facility], [facility] provider you are looking for under [facility] and page to a number that you can be directly reached. If you still have difficulty reaching the provider, please page the [facility] (Director on Call) for the Hospitalists listed on amion for assistance.   If 7PM-7AM, please contact night-coverage [email or web address] Password [email or web address]  04/21/2021, 10:40 AM

## 2021-04-22 ENCOUNTER — Inpatient Hospital Stay (HOSPITAL_COMMUNITY): Payer: Medicare Other

## 2021-04-22 DIAGNOSIS — S72002D Fracture of unspecified part of neck of left femur, subsequent encounter for closed fracture with routine healing: Secondary | ICD-10-CM | POA: Diagnosis not present

## 2021-04-22 DIAGNOSIS — K860 Alcohol-induced chronic pancreatitis: Secondary | ICD-10-CM | POA: Diagnosis not present

## 2021-04-22 DIAGNOSIS — R2681 Unsteadiness on feet: Secondary | ICD-10-CM | POA: Diagnosis not present

## 2021-04-22 DIAGNOSIS — F1010 Alcohol abuse, uncomplicated: Secondary | ICD-10-CM | POA: Diagnosis not present

## 2021-04-22 LAB — TYPE AND SCREEN
ABO/RH(D): O POS
Antibody Screen: NEGATIVE

## 2021-04-22 LAB — COMPREHENSIVE METABOLIC PANEL
ALT: 11 U/L (ref 0–44)
AST: 18 U/L (ref 15–41)
Albumin: 3.7 g/dL (ref 3.5–5.0)
Alkaline Phosphatase: 50 U/L (ref 38–126)
Anion gap: 8 (ref 5–15)
BUN: 10 mg/dL (ref 8–23)
CO2: 24 mmol/L (ref 22–32)
Calcium: 8.9 mg/dL (ref 8.9–10.3)
Chloride: 97 mmol/L — ABNORMAL LOW (ref 98–111)
Creatinine, Ser: 0.59 mg/dL (ref 0.44–1.00)
GFR, Estimated: >60 mL/min (ref >60)
Glucose, Bld: 113 mg/dL — ABNORMAL HIGH (ref 70–99)
Potassium: 3.3 mmol/L — ABNORMAL LOW (ref 3.5–5.1)
Sodium: 129 mmol/L — ABNORMAL LOW (ref 135–145)
Total Bilirubin: 1.1 mg/dL (ref 0.3–1.2)
Total Protein: 7.6 g/dL (ref 6.5–8.1)

## 2021-04-22 LAB — LIPID PANEL
Cholesterol: 160 mg/dL (ref 0–200)
HDL: 78 mg/dL (ref >40)
LDL Cholesterol: 75 mg/dL (ref 0–99)
Total CHOL/HDL Ratio: 2.1 RATIO
Triglycerides: 37 mg/dL (ref <150)
VLDL: 7 mg/dL (ref 0–40)

## 2021-04-22 LAB — MAGNESIUM: Magnesium: 2 mg/dL (ref 1.7–2.4)

## 2021-04-22 LAB — PHOSPHORUS: Phosphorus: 3.6 mg/dL (ref 2.5–4.6)

## 2021-04-22 MED ORDER — VITAMIN D (ERGOCALCIFEROL) 1.25 MG (50000 UNIT) PO CAPS
50000.0000 [IU] | ORAL_CAPSULE | ORAL | Status: DC
  Administered 2021-04-22 – 2021-04-29 (×2): 50000 [IU] via ORAL
  Filled 2021-04-22 (×3): qty 1

## 2021-04-22 MED ORDER — POTASSIUM CHLORIDE 10 MEQ/100ML IV SOLN
10.0000 meq | INTRAVENOUS | Status: AC
Start: 2021-04-22 — End: 2021-04-22
  Administered 2021-04-22 (×3): 10 meq via INTRAVENOUS
  Filled 2021-04-22 (×3): qty 100

## 2021-04-22 NOTE — ED Notes (Signed)
[facility]ed [facility] for transport to [facility]. 

## 2021-04-22 NOTE — ED Notes (Signed)
[facility] at bedside 

## 2021-04-22 NOTE — ED Notes (Signed)
Pt with an unwitnessed fall at approximately 1230. Found to be lying on left side. 3 nurses, including charge nurse, at bedside to help pt back into bed. Pt removed IV. Safety floor pads and bed alarm in place. EDP in to assess. Pt c/o left hip and leg pain.

## 2021-04-22 NOTE — ED Notes (Signed)
Pt changed due to incontenience. Clean purwick placed at this time

## 2021-04-22 NOTE — Progress Notes (Addendum)
Judy Davis is a 65 y.o. female patient admitted. Awake, alert - oriented  X 4 - no acute distress noted.  VSS - Blood pressure (!) 167/91, pulse 90, temperature 98.3 F (36.8 C), temperature source Oral, resp. rate 16, height '5\' 6"'$  (1.676 m), weight 60.3 kg, SpO2 100 %.    IV in place, occlusive dsg intact without redness.  Orientation to room, and floor completed.  Admission INP armband ID verified with patient/family, and in place.   SR up x 2, fall assessment complete, with patient and family able to verbalize understanding of risk associated with falls, and verbalized understanding to call nsg before up out of bed.  Call light within reach, patient able to voice, and demonstrate understanding. No evidence of skin break down noted on exam.  Admission nurse notified of admission.     Will cont to eval and treat per MD orders.  Camillia Herter, RN 04/22/2021 6:56 PM

## 2021-04-22 NOTE — Progress Notes (Signed)
SCD on. NS 50 ml /hr started. Purewick. Report taken from

## 2021-04-22 NOTE — Progress Notes (Signed)
Okay to eat tonight. NPO after midnight, hold subQ heparin after midnight.  Plan for total hip arthroplasty tomorrow afternoon.  Full consult to follow

## 2021-04-22 NOTE — Progress Notes (Signed)
PROGRESS NOTE   Judy Davis  NWG:[id number] DOB: 1956/09/11 DOA: 04/21/2021 PCP: Health, [facility]   Chief Complaint  Patient presents with   Fall   Level of care: Med-Surg  Brief Admission History:  65 y.o. female with medical history significant for chronic alcoholism (last drink was 2 weeks ago), vitamin D deficiency, essential hypertension, chronic hyponatremia, hyperlipidemia, gait instability and moderate to severe malnutrition reportedly fell in her kitchen yesterday tripping over her feet. She denies that alcohol was involved.  She lives with husband.  She says that after falling she was unable to get up.  She denies hitting her head.  She reports that family lifter her onto the couch where she remained for 24 hours.  She last ate 2 days ago.  She continued to complain of left hip and thigh pain and inability to ambulate.  EMS brought her to the ED for further eval.   ED Course: Temperature 98.3, pulse rate 94, blood pressure 172/94, pulse ox 100% on room air, sodium 127, potassium 3.8, glucose 122, creatinine 0.68, hemoglobin 11.1, WBC 9.7, platelet count 270.  Ethyl alcohol level less than 10.  Chest x-ray no acute findings.  X-ray of left hip with findings of left femoral neck fracture.  EKG normal sinus rhythm.  Orthopedics Dr. August Saucer was consulted and requested patient be admitted to [facility] for total hip arthropathy.  Patient is NPO.  Admission was requested.  Assessment & Plan:   Principal Problem:   Closed left hip fracture (HCC) Active Problems:   Hyponatremia   HTN (hypertension)   Anemia   Hyperglycemia   Alcohol abuse   Gait instability   GERD (gastroesophageal reflux disease)   Severe Vitamin D deficiency   Malnutrition of moderate degree   Chronic pancreatitis (HCC)   Hypothyroidism   Hyperlipidemia  LEFT femoral neck fracture with 2 cm SUPERIOR displacement and varus angulation -patient being admitted to [facility] as requested by  Dr. August Saucer orthopedics for THA timing to be decided by orthopedics.  Patient is NPO.  Hip fracture order set has been initiated.  IV pain medication ordered.  Bed rest.  Fall precautions ordered.    Repeat Fall - Pt had unwitnessed fall while holding in ED, found on floor on left side.  C/o left hip and leg pain.  Repeat images ordered.  Unfortunately patient still waiting on bed assignment for transfer to [facility].    Hyponatremia-this is chronic for patient.  She is clinically dehydrated at this time and poor oral intake over the last 2 days.  I have ordered normal saline infusion to rehydrate.  Follow BMP.   GERD-Protonix ordered for GI protection.   History of alcohol abuse-patient reports last alcohol consumption was 2 weeks ago.  She is on a CIWA protocol but if she is telling the truth about her alcohol consumption she is not in danger of acute withdrawal at this time.   Severe vitamin D deficiency-not currently taking vitamin D supplement recheck 25-hydroxy vitamin D level was 17.  Oral vitamin D replacement ordered.     Hypothyroidism-resumed home levothyroxine.   Essential hypertension-poorly controlled likely exacerbated by pain.  Resume home blood pressure medications and pain medication initiated.  IV hydralazine ordered.     Acute blood loss anemia-likely secondary to acute hip fracture.  Follow CBC.   Hyperlipidemia-patient is taking rosuvastatin daily.  Fasting lipid panel in a.m.   History of chronic pancreatitis-patient reports no longer taking Creon supplements and reports  she stopped alcohol use 2 weeks ago.   DVT prophylaxis: Subcu heparin Code Status: Full Family Communication: Sister at bedside Disposition Plan: Transfer to [facility] Consults called: Orthopedics with Dr. August Saucer Admission status: Inpatient Status is: Inpatient  Remains inpatient appropriate because:IV treatments appropriate due to intensity of illness or inability to take PO and Inpatient level of  care appropriate due to severity of illness  Dispo: The patient is from: Home              Anticipated d/c is to: SNF              Patient currently is not medically stable to d/c.   Difficult to place patient No  Consultants:  Orthopedics Dr. August Saucer   Procedures:  Pending   Antimicrobials:     Subjective: Pt remains on bedrest awaiting for transfer to [facility].   RN reports patient had unwitnessed fall, found lying on left side.  Repeat xrays ordered.    Objective: Vitals:   04/22/21 0900 04/22/21 1130 04/22/21 1145 04/22/21 1200  BP: (!) 154/86 (!) 183/89 (!) 183/89   Pulse: 82  80 92  Resp: 14 20 17 17   Temp:      TempSrc:      SpO2: 99%  100% 100%  Weight:      Height:        Intake/Output Summary (Last 24 hours) at 04/22/2021 1301 Last data filed at 04/22/2021 1117 Gross per 24 hour  Intake 100 ml  Output --  Net 100 ml   Filed Weights   04/21/21 0739  Weight: 60.3 kg    Examination:  Constitutional: Frail emaciated appearing female awake and alert NAD, calm, appears uncomfortable. Eyes: PERRL, lids and conjunctivae normal, no scleral icterus ENMT: Mucous membranes are dry. Posterior pharynx clear of any exudate or lesions.poor dentition. Neck: normal, supple, no masses, no thyromegaly Respiratory: clear to auscultation bilaterally, no wheezing, no crackles. Normal respiratory effort. No accessory muscle use. Cardiovascular: normal s1, s2 sounds, no murmurs / rubs / gallops. No extremity edema. 2+ pedal pulses. No carotid bruits. Abdomen: no tenderness, no masses palpated. No hepatosplenomegaly. Bowel sounds positive. Musculoskeletal: Left leg appears shortened and externally rotated.  Tender to palpation.  Pedal pulses palpated bilateral.  Warm lower extremities. Skin: no rashes, lesions, ulcers. No induration Neurologic: CN 2-12 grossly intact. Sensation intact, DTR normal. Strength 5/5 in all 4. Psychiatric: Poor judgment and insight. Alert and oriented x 3. Normal  mood.  Data Reviewed: I have personally reviewed following labs and imaging studies  CBC: Recent Labs  Lab 04/21/21 0841  WBC 9.7  NEUTROABS 7.4  HGB 11.1*  HCT 34.3*  MCV 96.9  PLT 270    Basic Metabolic Panel: Recent Labs  Lab 04/21/21 0841 04/22/21 0536  NA 127* 129*  K 3.8 3.3*  CL 92* 97*  CO2 28 24  GLUCOSE 122* 113*  BUN 10 10  CREATININE 0.68 0.59  CALCIUM 9.5 8.9  MG  --  2.0  PHOS  --  3.6    GFR: Estimated Creatinine Clearance: 64.8 mL/min (by C-G formula based on SCr of 0.59 mg/dL).  Liver Function Tests: Recent Labs  Lab 04/22/21 0536  AST 18  ALT 11  ALKPHOS 50  BILITOT 1.1  PROT 7.6  ALBUMIN 3.7    CBG: No results for input(s): GLUCAP in the last 168 hours.  Recent Results (from the past 240 hour(s))  Resp Panel by RT-PCR (Flu A&B, Covid) Nasopharyngeal Swab  Status: None   Collection Time: 04/21/21  8:17 AM   Specimen: Nasopharyngeal Swab; Nasopharyngeal(NP) swabs in vial transport medium  Result Value Ref Range Status   SARS Coronavirus 2 by RT PCR NEGATIVE NEGATIVE Final    Comment: (NOTE) SARS-CoV-2 target nucleic acids are NOT DETECTED.  The SARS-CoV-2 RNA is generally detectable in upper respiratory specimens during the acute phase of infection. The lowest concentration of SARS-CoV-2 viral copies this assay can detect is 138 copies/mL. A negative result does not preclude SARS-Cov-2 infection and should not be used as the sole basis for treatment or other patient management decisions. A negative result may occur with  improper specimen collection/handling, submission of specimen other than nasopharyngeal swab, presence of viral mutation(s) within the areas targeted by this assay, and inadequate number of viral copies(<138 copies/mL). A negative result must be combined with clinical observations, patient history, and epidemiological information. The expected result is Negative.  Fact Sheet for Patients:   BloggerCourse.com  Fact Sheet for Healthcare Providers:  SeriousBroker.it  This test is no t yet approved or cleared by the [place] FDA and  has been authorized for detection and/or diagnosis of SARS-CoV-2 by FDA under an Emergency Use Authorization (EUA). This EUA will remain  in effect (meaning this test can be used) for the duration of the COVID-19 declaration under Section 564(b)(1) of the Act, 21 U.S.C.section 360bbb-3(b)(1), unless the authorization is terminated  or revoked sooner.       Influenza A by PCR NEGATIVE NEGATIVE Final   Influenza B by PCR NEGATIVE NEGATIVE Final    Comment: (NOTE) The Xpert Xpress SARS-CoV-2/FLU/RSV plus assay is intended as an aid in the diagnosis of influenza from Nasopharyngeal swab specimens and should not be used as a sole basis for treatment. Nasal washings and aspirates are unacceptable for Xpert Xpress SARS-CoV-2/FLU/RSV testing.  Fact Sheet for Patients: BloggerCourse.com  Fact Sheet for Healthcare Providers: SeriousBroker.it  This test is not yet approved or cleared by the [place] FDA and has been authorized for detection and/or diagnosis of SARS-CoV-2 by FDA under an Emergency Use Authorization (EUA). This EUA will remain in effect (meaning this test can be used) for the duration of the COVID-19 declaration under Section 564(b)(1) of the Act, 21 U.S.C. section 360bbb-3(b)(1), unless the authorization is terminated or revoked.  Performed at [facility], [address] [place], [place] [address]      Radiology Studies: DG Chest 1 View  Result Date: 04/21/2021 CLINICAL DATA:  LEFT hip fracture. EXAM: CHEST  1 VIEW COMPARISON:  02/28/2020 FINDINGS: The cardiomediastinal silhouette is unremarkable. There is no evidence of focal airspace disease, pulmonary edema, suspicious pulmonary nodule/mass, pleural effusion, or  pneumothorax. No acute bony abnormalities are identified. Remote LEFT rib fractures are again identified. IMPRESSION: No active disease. Electronically Signed   By: Harmon Pier M.D.   On: 04/21/2021 09:48   DG Hip Unilat With Pelvis 2-3 Views Left  Result Date: 04/21/2021 CLINICAL DATA:  Acute LEFT hip pain following fall. Initial encounter. EXAM: DG HIP (WITH OR WITHOUT PELVIS) 2-3V LEFT COMPARISON:  None. FINDINGS: A LEFT femoral neck fracture is identified with 2 cm SUPERIOR displacement and varus angulation. There is no evidence of dislocation. No other significant bony abnormalities are noted. IMPRESSION: LEFT femoral neck fracture with 2 cm SUPERIOR displacement and varus angulation. Electronically Signed   By: Harmon Pier M.D.   On: 04/21/2021 09:47    Scheduled Meds:  folic acid  1 mg Oral Daily  heparin  5,000 Units Subcutaneous Q8H   levothyroxine  50 mcg Oral QAC breakfast   LORazepam  0-4 mg Intravenous Q6H   Followed by   Melene Muller ON 04/23/2021] LORazepam  0-4 mg Intravenous Q12H   metoprolol tartrate  50 mg Oral BID   multivitamin with minerals  1 tablet Oral Daily   rosuvastatin  5 mg Oral QPM   thiamine  100 mg Oral Daily   Or   thiamine  100 mg Intravenous Daily   Continuous Infusions:  sodium chloride 50 mL/hr at 04/21/21 1044   tranexamic acid Stopped (04/21/21 1200)     LOS: 1 day   Time spent: 32 mins   Booker Bhatnagar Laural Benes, MD How to contact the [facility] Attending or Consulting provider 7A - 7P or covering provider during after hours 7P -7A, for this patient?  Check the care team in [facility] and look for a) attending/consulting [facility] provider listed and b) the [facility] team listed Log into [email or web address] and use [facility]'s universal password to access. If you do not have the password, please contact the hospital operator. Locate the [facility] provider you are looking for under [facility] and page to a number that you can be directly reached. If you still have difficulty reaching  the provider, please page the [facility] (Director on Call) for the Hospitalists listed on amion for assistance.  04/22/2021, 1:01 PM

## 2021-04-22 NOTE — ED Notes (Signed)
Report given to carelink 

## 2021-04-23 ENCOUNTER — Inpatient Hospital Stay (HOSPITAL_COMMUNITY): Payer: Medicare Other | Admitting: Anesthesiology

## 2021-04-23 ENCOUNTER — Inpatient Hospital Stay (HOSPITAL_COMMUNITY): Payer: Medicare Other

## 2021-04-23 ENCOUNTER — Encounter (HOSPITAL_COMMUNITY)
Admission: EM | Disposition: A | Discharge status: Skilled Nursing Facility | Payer: Self-pay | Source: Home / Self Care | Attending: Internal Medicine

## 2021-04-23 DIAGNOSIS — S72002D Fracture of unspecified part of neck of left femur, subsequent encounter for closed fracture with routine healing: Secondary | ICD-10-CM | POA: Diagnosis not present

## 2021-04-23 HISTORY — PX: TOTAL HIP ARTHROPLASTY: SHX124

## 2021-04-23 LAB — CBC WITH DIFFERENTIAL/PLATELET
Abs Immature Granulocytes: 0.03 K/uL (ref 0.00–0.07)
Basophils Absolute: 0 K/uL (ref 0.0–0.1)
Basophils Relative: 0 %
Eosinophils Absolute: 0.1 K/uL (ref 0.0–0.5)
Eosinophils Relative: 1 %
HCT: 30.7 % — ABNORMAL LOW (ref 36.0–46.0)
Hemoglobin: 10.5 g/dL — ABNORMAL LOW (ref 12.0–15.0)
Immature Granulocytes: 0 %
Lymphocytes Relative: 18 %
Lymphs Abs: 1.5 K/uL (ref 0.7–4.0)
MCH: 32.2 pg (ref 26.0–34.0)
MCHC: 34.2 g/dL (ref 30.0–36.0)
MCV: 94.2 fL (ref 80.0–100.0)
Monocytes Absolute: 0.9 K/uL (ref 0.1–1.0)
Monocytes Relative: 11 %
Neutro Abs: 5.9 K/uL (ref 1.7–7.7)
Neutrophils Relative %: 70 %
Platelets: 296 K/uL (ref 150–400)
RBC: 3.26 MIL/uL — ABNORMAL LOW (ref 3.87–5.11)
RDW: 12.8 % (ref 11.5–15.5)
WBC: 8.5 K/uL (ref 4.0–10.5)
nRBC: 0 % (ref 0.0–0.2)

## 2021-04-23 LAB — BASIC METABOLIC PANEL
Anion gap: 8 (ref 5–15)
BUN: 7 mg/dL — ABNORMAL LOW (ref 8–23)
CO2: 25 mmol/L (ref 22–32)
Calcium: 9 mg/dL (ref 8.9–10.3)
Chloride: 95 mmol/L — ABNORMAL LOW (ref 98–111)
Creatinine, Ser: 0.56 mg/dL (ref 0.44–1.00)
GFR, Estimated: >60 mL/min (ref >60)
Glucose, Bld: 122 mg/dL — ABNORMAL HIGH (ref 70–99)
Potassium: 4 mmol/L (ref 3.5–5.1)
Sodium: 128 mmol/L — ABNORMAL LOW (ref 135–145)

## 2021-04-23 LAB — SURGICAL PCR SCREEN
MRSA, PCR: NEGATIVE
Staphylococcus aureus: NEGATIVE

## 2021-04-23 SURGERY — ARTHROPLASTY, HIP, TOTAL, ANTERIOR APPROACH
Anesthesia: General | Site: Hip | Laterality: Left

## 2021-04-23 MED ORDER — CHLORHEXIDINE GLUCONATE CLOTH 2 % EX PADS
6.0000 | MEDICATED_PAD | Freq: Every day | CUTANEOUS | Status: DC
  Administered 2021-04-24 – 2021-04-29 (×6): 6 via TOPICAL

## 2021-04-23 MED ORDER — ONDANSETRON HCL 4 MG/2ML IJ SOLN
INTRAMUSCULAR | Status: DC | PRN
  Administered 2021-04-23: 4 mg via INTRAVENOUS

## 2021-04-23 MED ORDER — METOCLOPRAMIDE HCL 5 MG/ML IJ SOLN: 5.0000 mg | Freq: Three times a day (TID) | INTRAMUSCULAR | Status: DC | PRN

## 2021-04-23 MED ORDER — ONDANSETRON HCL 4 MG/2ML IJ SOLN: 4.0000 mg | Freq: Four times a day (QID) | INTRAMUSCULAR | Status: DC | PRN

## 2021-04-23 MED ORDER — CHLORHEXIDINE GLUCONATE 0.12 % MT SOLN: 15.0000 mL | Freq: Once | OROMUCOSAL | Status: AC

## 2021-04-23 MED ORDER — PROPOFOL 10 MG/ML IV BOLUS
INTRAVENOUS | Status: DC | PRN
  Administered 2021-04-23: 70 mg via INTRAVENOUS

## 2021-04-23 MED ORDER — TRANEXAMIC ACID 1000 MG/10ML IV SOLN
2000.0000 mg | INTRAVENOUS | Status: AC
  Administered 2021-04-23: 2000 mg via TOPICAL
  Filled 2021-04-23 (×3): qty 20

## 2021-04-23 MED ORDER — 0.9 % SODIUM CHLORIDE (POUR BTL) OPTIME
TOPICAL | Status: DC | PRN
  Administered 2021-04-23 (×2): 1000 mL

## 2021-04-23 MED ORDER — ORAL CARE MOUTH RINSE: 15.0000 mL | Freq: Once | OROMUCOSAL | Status: AC

## 2021-04-23 MED ORDER — VANCOMYCIN HCL 1000 MG IV SOLR
INTRAVENOUS | Status: AC
  Filled 2021-04-23: qty 1000

## 2021-04-23 MED ORDER — ONDANSETRON HCL 4 MG PO TABS: 4.0000 mg | ORAL_TABLET | Freq: Four times a day (QID) | ORAL | Status: DC | PRN

## 2021-04-23 MED ORDER — METHOCARBAMOL 1000 MG/10ML IJ SOLN
500.0000 mg | Freq: Four times a day (QID) | INTRAVENOUS | Status: DC | PRN
  Filled 2021-04-23: qty 5

## 2021-04-23 MED ORDER — BUPIVACAINE HCL (PF) 0.25 % IJ SOLN
INTRAMUSCULAR | Status: AC
  Filled 2021-04-23: qty 30

## 2021-04-23 MED ORDER — STERILE WATER FOR IRRIGATION IR SOLN
Status: DC | PRN
  Administered 2021-04-23: 1000 mL

## 2021-04-23 MED ORDER — FENTANYL CITRATE (PF) 250 MCG/5ML IJ SOLN
INTRAMUSCULAR | Status: AC
  Filled 2021-04-23: qty 5

## 2021-04-23 MED ORDER — SODIUM CHLORIDE 0.9 % IR SOLN
Status: DC | PRN
  Administered 2021-04-23: 3000 mL

## 2021-04-23 MED ORDER — LACTATED RINGERS IV SOLN: INTRAVENOUS | Status: DC

## 2021-04-23 MED ORDER — ROCURONIUM BROMIDE 100 MG/10ML IV SOLN
INTRAVENOUS | Status: DC | PRN
  Administered 2021-04-23: 50 mg via INTRAVENOUS

## 2021-04-23 MED ORDER — MORPHINE SULFATE (PF) 2 MG/ML IV SOLN: 0.5000 mg | INTRAVENOUS | Status: DC | PRN

## 2021-04-23 MED ORDER — DOCUSATE SODIUM 100 MG PO CAPS
100.0000 mg | ORAL_CAPSULE | Freq: Two times a day (BID) | ORAL | Status: DC
  Administered 2021-04-23 – 2021-04-24 (×2): 100 mg via ORAL
  Filled 2021-04-23 (×2): qty 1

## 2021-04-23 MED ORDER — PHENOL 1.4 % MT LIQD: 1.0000 | OROMUCOSAL | Status: DC | PRN

## 2021-04-23 MED ORDER — BUPIVACAINE HCL (PF) 0.25 % IJ SOLN
INTRAMUSCULAR | Status: DC | PRN
  Administered 2021-04-23: 30 mL

## 2021-04-23 MED ORDER — MORPHINE SULFATE (PF) 4 MG/ML IV SOLN
INTRAVENOUS | Status: AC
  Filled 2021-04-23: qty 2

## 2021-04-23 MED ORDER — CLONIDINE HCL (ANALGESIA) 100 MCG/ML EP SOLN
EPIDURAL | Status: AC
  Filled 2021-04-23: qty 10

## 2021-04-23 MED ORDER — SUGAMMADEX SODIUM 200 MG/2ML IV SOLN
INTRAVENOUS | Status: DC | PRN
  Administered 2021-04-23: 200 mg via INTRAVENOUS

## 2021-04-23 MED ORDER — ACETAMINOPHEN 500 MG PO TABS
500.0000 mg | ORAL_TABLET | Freq: Four times a day (QID) | ORAL | Status: AC
  Administered 2021-04-23 – 2021-04-24 (×3): 500 mg via ORAL
  Filled 2021-04-23 (×3): qty 1

## 2021-04-23 MED ORDER — HYDROCODONE-ACETAMINOPHEN 5-325 MG PO TABS
1.0000 | ORAL_TABLET | ORAL | Status: DC | PRN
  Administered 2021-04-24 – 2021-04-26 (×2): 1 via ORAL
  Administered 2021-04-26 – 2021-04-27 (×2): 2 via ORAL
  Administered 2021-04-27 – 2021-04-29 (×4): 1 via ORAL
  Filled 2021-04-23 (×3): qty 2
  Filled 2021-04-23 (×4): qty 1
  Filled 2021-04-23: qty 2
  Filled 2021-04-23: qty 1

## 2021-04-23 MED ORDER — PHENYLEPHRINE HCL-NACL 20-0.9 MG/250ML-% IV SOLN
INTRAVENOUS | Status: DC | PRN
  Administered 2021-04-23: 50 ug/min via INTRAVENOUS

## 2021-04-23 MED ORDER — POVIDONE-IODINE 10 % EX SWAB
2.0000 "application " | Freq: Once | CUTANEOUS | Status: AC
  Administered 2021-04-23: 2 via TOPICAL

## 2021-04-23 MED ORDER — IRRISEPT - 450ML BOTTLE WITH 0.05% CHG IN STERILE WATER, USP 99.95% OPTIME
TOPICAL | Status: DC | PRN
  Administered 2021-04-23: 450 mL via TOPICAL

## 2021-04-23 MED ORDER — METHOCARBAMOL 500 MG PO TABS
500.0000 mg | ORAL_TABLET | Freq: Four times a day (QID) | ORAL | Status: DC | PRN
  Administered 2021-04-23 – 2021-04-24 (×2): 500 mg via ORAL
  Filled 2021-04-23 (×2): qty 1

## 2021-04-23 MED ORDER — SUCCINYLCHOLINE CHLORIDE 200 MG/10ML IV SOSY
PREFILLED_SYRINGE | INTRAVENOUS | Status: DC | PRN
  Administered 2021-04-23: 100 mg via INTRAVENOUS

## 2021-04-23 MED ORDER — LACTATED RINGERS IV SOLN: INTRAVENOUS | Status: DC | PRN

## 2021-04-23 MED ORDER — ACETAMINOPHEN 325 MG PO TABS: 325.0000 mg | ORAL_TABLET | Freq: Four times a day (QID) | ORAL | Status: DC | PRN

## 2021-04-23 MED ORDER — MORPHINE SULFATE 4 MG/ML IJ SOLN
INTRAMUSCULAR | Status: DC | PRN
  Administered 2021-04-23: 8 mg via INTRAMUSCULAR

## 2021-04-23 MED ORDER — PHENYLEPHRINE HCL (PRESSORS) 10 MG/ML IV SOLN
INTRAVENOUS | Status: DC | PRN
  Administered 2021-04-23: 100 ug via INTRAVENOUS

## 2021-04-23 MED ORDER — ACETAMINOPHEN 325 MG PO TABS
325.0000 mg | ORAL_TABLET | Freq: Four times a day (QID) | ORAL | Status: DC | PRN
  Administered 2021-04-25: 650 mg via ORAL
  Administered 2021-04-27: 325 mg via ORAL
  Filled 2021-04-23: qty 2
  Filled 2021-04-23: qty 1

## 2021-04-23 MED ORDER — ASPIRIN 81 MG PO CHEW
81.0000 mg | CHEWABLE_TABLET | Freq: Two times a day (BID) | ORAL | Status: DC
  Administered 2021-04-24 – 2021-04-29 (×11): 81 mg via ORAL
  Filled 2021-04-23 (×12): qty 1

## 2021-04-23 MED ORDER — HYDROMORPHONE HCL 1 MG/ML IJ SOLN
INTRAMUSCULAR | Status: AC
  Filled 2021-04-23: qty 1

## 2021-04-23 MED ORDER — TRANEXAMIC ACID-NACL 1000-0.7 MG/100ML-% IV SOLN
1000.0000 mg | INTRAVENOUS | Status: AC
  Administered 2021-04-23: 1000 mg via INTRAVENOUS
  Filled 2021-04-23 (×3): qty 100

## 2021-04-23 MED ORDER — CHLORHEXIDINE GLUCONATE 4 % EX LIQD
60.0000 mL | Freq: Once | CUTANEOUS | Status: AC
  Administered 2021-04-23: 4 via TOPICAL
  Filled 2021-04-23: qty 15

## 2021-04-23 MED ORDER — ALBUMIN HUMAN 5 % IV SOLN: INTRAVENOUS | Status: DC | PRN

## 2021-04-23 MED ORDER — PROPOFOL 10 MG/ML IV BOLUS
INTRAVENOUS | Status: AC
  Filled 2021-04-23: qty 20

## 2021-04-23 MED ORDER — MENTHOL 3 MG MT LOZG: 1.0000 | LOZENGE | OROMUCOSAL | Status: DC | PRN

## 2021-04-23 MED ORDER — CEFAZOLIN SODIUM-DEXTROSE 2-4 GM/100ML-% IV SOLN
2.0000 g | Freq: Three times a day (TID) | INTRAVENOUS | Status: AC
Start: 2021-04-23 — End: 2021-04-24
  Administered 2021-04-23 – 2021-04-24 (×2): 2 g via INTRAVENOUS
  Filled 2021-04-23 (×2): qty 100

## 2021-04-23 MED ORDER — CHLORHEXIDINE GLUCONATE 0.12 % MT SOLN
OROMUCOSAL | Status: AC
  Administered 2021-04-23: 15 mL via OROMUCOSAL
  Filled 2021-04-23: qty 15

## 2021-04-23 MED ORDER — CLONIDINE HCL (ANALGESIA) 100 MCG/ML EP SOLN
EPIDURAL | Status: DC | PRN
  Administered 2021-04-23: .75 mL

## 2021-04-23 MED ORDER — TRANEXAMIC ACID-NACL 1000-0.7 MG/100ML-% IV SOLN
1000.0000 mg | Freq: Once | INTRAVENOUS | Status: AC
  Administered 2021-04-23: 1000 mg via INTRAVENOUS
  Filled 2021-04-23: qty 100

## 2021-04-23 MED ORDER — MIDAZOLAM HCL 2 MG/2ML IJ SOLN
INTRAMUSCULAR | Status: AC
  Filled 2021-04-23: qty 2

## 2021-04-23 MED ORDER — CEFAZOLIN SODIUM-DEXTROSE 2-4 GM/100ML-% IV SOLN
2.0000 g | INTRAVENOUS | Status: AC
  Administered 2021-04-23: 2 g via INTRAVENOUS
  Filled 2021-04-23 (×3): qty 100

## 2021-04-23 MED ORDER — VANCOMYCIN HCL 1000 MG IV SOLR
INTRAVENOUS | Status: DC | PRN
  Administered 2021-04-23: 1000 mg via TOPICAL

## 2021-04-23 MED ORDER — METOCLOPRAMIDE HCL 5 MG PO TABS: 5.0000 mg | ORAL_TABLET | Freq: Three times a day (TID) | ORAL | Status: DC | PRN

## 2021-04-23 MED ORDER — HYDROMORPHONE HCL 1 MG/ML IJ SOLN
0.2500 mg | INTRAMUSCULAR | Status: DC | PRN
  Administered 2021-04-23: 0.25 mg via INTRAVENOUS

## 2021-04-23 MED ORDER — FENTANYL CITRATE (PF) 100 MCG/2ML IJ SOLN
INTRAMUSCULAR | Status: DC | PRN
  Administered 2021-04-23: 50 ug via INTRAVENOUS
  Administered 2021-04-23: 100 ug via INTRAVENOUS
  Administered 2021-04-23 (×2): 50 ug via INTRAVENOUS

## 2021-04-23 MED ORDER — ONDANSETRON HCL 4 MG/2ML IJ SOLN: 4.0000 mg | Freq: Once | INTRAMUSCULAR | Status: DC | PRN

## 2021-04-23 SURGICAL SUPPLY — 55 items
BAG COUNTER SPONGE SURGICOUNT (BAG) ×2 IMPLANT
BAG DECANTER FOR FLEXI CONT (MISCELLANEOUS) ×2 IMPLANT
BAG SPNG CNTER NS LX DISP (BAG) ×1
BALL HIP CERAMIC 32MM PLUS 9 ×1 IMPLANT
BLADE CLIPPER SURG (BLADE) ×2 IMPLANT
BLADE SAW SGTL 18X1.27X75 (BLADE) ×2 IMPLANT
CELLS DAT CNTRL 66122 CELL SVR (MISCELLANEOUS) ×1 IMPLANT
CLSR STERI-STRIP ANTIMIC 1/2X4 (GAUZE/BANDAGES/DRESSINGS) ×2 IMPLANT
COVER PERINEAL POST (MISCELLANEOUS) ×2 IMPLANT
COVER SURGICAL LIGHT HANDLE (MISCELLANEOUS) ×2 IMPLANT
CUP ACET PINNACLE SECTR 48MM (Joint) IMPLANT
DRAPE C-ARM 42X72 X-RAY (DRAPES) ×2 IMPLANT
DRAPE STERI IOBAN 125X83 (DRAPES) ×2 IMPLANT
DRAPE U-SHAPE 47X51 STRL (DRAPES) ×6 IMPLANT
DRSG AQUACEL AG ADV 3.5X10 (GAUZE/BANDAGES/DRESSINGS) ×2 IMPLANT
DURAPREP 26ML APPLICATOR (WOUND CARE) ×2 IMPLANT
ELECT BLADE 4.0 EZ CLEAN MEGAD (MISCELLANEOUS) ×2
ELECT REM PT RETURN 9FT ADLT (ELECTROSURGICAL) ×2
ELECTRODE BLDE 4.0 EZ CLN MEGD (MISCELLANEOUS) ×1 IMPLANT
ELECTRODE REM PT RTRN 9FT ADLT (ELECTROSURGICAL) ×1 IMPLANT
GLOVE SRG 8 PF TXTR STRL LF DI (GLOVE) ×1 IMPLANT
GLOVE SURG LTX SZ8 (GLOVE) ×4 IMPLANT
GLOVE SURG UNDER POLY LF SZ8 (GLOVE) ×2
GOWN STRL REUS W/ TWL LRG LVL3 (GOWN DISPOSABLE) ×3 IMPLANT
GOWN STRL REUS W/TWL LRG LVL3 (GOWN DISPOSABLE) ×6
HANDPIECE INTERPULSE COAX TIP (DISPOSABLE) ×2
HIP BALL CERAMIC 32MM PLUS 9 ×2 IMPLANT
HOOD PEEL AWAY FLYTE STAYCOOL (MISCELLANEOUS) ×6 IMPLANT
JET LAVAGE IRRISEPT WOUND (IRRIGATION / IRRIGATOR) ×2
KIT BASIN OR (CUSTOM PROCEDURE TRAY) ×2 IMPLANT
KIT TURNOVER KIT B (KITS) ×2 IMPLANT
LAVAGE JET IRRISEPT WOUND (IRRIGATION / IRRIGATOR) ×1 IMPLANT
NEEDLE HYPO 22GX1.5 SAFETY (NEEDLE) ×2 IMPLANT
NS IRRIG 1000ML POUR BTL (IV SOLUTION) ×2 IMPLANT
PACK TOTAL JOINT (CUSTOM PROCEDURE TRAY) ×2 IMPLANT
PAD ARMBOARD 7.5X6 YLW CONV (MISCELLANEOUS) ×2 IMPLANT
PINN ALTRX NEUT ID X OD 32X48 ×1 IMPLANT
PINNSECTOR W/GRIP ACE CUP 48MM (Joint) ×2 IMPLANT
RTRCTR WOUND ALEXIS 18CM MED (MISCELLANEOUS) ×2
SET HNDPC FAN SPRY TIP SCT (DISPOSABLE) ×1 IMPLANT
STEM FEM ACTIS STD SZ2 (Stem) ×2 IMPLANT
STRIP CLOSURE SKIN 1/2X4 (GAUZE/BANDAGES/DRESSINGS) ×2 IMPLANT
SUT ETHIBOND NAB CT1 #1 30IN (SUTURE) ×6 IMPLANT
SUT VIC AB 0 CT1 27 (SUTURE) ×8
SUT VIC AB 0 CT1 27XBRD ANBCTR (SUTURE) ×4 IMPLANT
SUT VIC AB 1 CT1 27 (SUTURE) ×16
SUT VIC AB 1 CT1 27XBRD ANBCTR (SUTURE) ×8 IMPLANT
SUT VIC AB 2-0 CT1 27 (SUTURE) ×4
SUT VIC AB 2-0 CT1 TAPERPNT 27 (SUTURE) ×2 IMPLANT
SYR 30ML LL (SYRINGE) ×2 IMPLANT
TAPE STRIPS DRAPE STRL (GAUZE/BANDAGES/DRESSINGS) ×2 IMPLANT
TOWEL GREEN STERILE (TOWEL DISPOSABLE) ×2 IMPLANT
TOWEL GREEN STERILE FF (TOWEL DISPOSABLE) ×2 IMPLANT
TRAY FOLEY MTR SLVR 16FR STAT (SET/KITS/TRAYS/PACK) ×2 IMPLANT
WATER STERILE IRR 1000ML POUR (IV SOLUTION) ×2 IMPLANT

## 2021-04-23 NOTE — Progress Notes (Signed)
Patient off floor for procedure 

## 2021-04-23 NOTE — Transfer of Care (Signed)
Immediate Anesthesia Transfer of Care Note  Patient: Judy Davis  Procedure(s) Performed: LEFT TOTAL HIP ARTHROPLASTY ANTERIOR APPROACH (Left: Hip)  Patient Location: PACU  Anesthesia Type:General  Level of Consciousness: drowsy  Airway & Oxygen Therapy: Patient Spontanous Breathing and Patient connected to face mask oxygen  Post-op Assessment: Report given to RN and Post -op Vital signs reviewed and stable  Post vital signs: Reviewed and stable  Last Vitals:  Vitals Value Taken Time  BP 155/81 04/23/21 1901  Temp 36.1 C 04/23/21 1900  Pulse 80 04/23/21 1913  Resp 12 04/23/21 1913  SpO2 100 % 04/23/21 1913  Vitals shown include unvalidated device data.  Last Pain:  Vitals:   04/23/21 1900  TempSrc:   PainSc: 0-No pain      Patients Stated Pain Goal: 3 ([phone] [id number])  Complications: No notable events documented.

## 2021-04-23 NOTE — Progress Notes (Signed)
PROGRESS NOTE    Judy Davis  GEX:[id number] DOB: May 02, 1956 DOA: 04/21/2021 PCP: Health, [facility]   Brief Narrative:  This 65 year old female with PMH significant for chronic alcoholism(last drink was 2 weeks ago), vitamin D deficiency , essential hypertension, chronic hyponatremia, hyperlipidemia, gait instability and moderate to severe malnutrition reportedly fell in her kitchen yesterday, tripping over her feet.  She lives with her husband.  She was unable to get up after the fall.  She denies any head injury.  She reports her family put her on couch where she remained for 24 hours,  she continued to complain of left hip pain and thigh pain.  x-ray shows left hip with left femoral neck fracture.  Patient was transferred from [facility] for possible open reduction and internal fixation.  Patient is scheduled to have left total hip arthroplasty today  Assessment & Plan:   Principal Problem:   Closed left hip fracture (HCC) Active Problems:   Hyponatremia   HTN (hypertension)   Anemia   Hyperglycemia   Alcohol abuse   Gait instability   GERD (gastroesophageal reflux disease)   Severe Vitamin D deficiency   Malnutrition of moderate degree   Chronic pancreatitis (HCC)   Hypothyroidism   Hyperlipidemia  Left femoral neck fracture: Patient presented s/p fall with left femoral neck fracture. Adequate pain control  Orthopedic consulted, patient is a scheduled to have total left hip hemiarthroplasty today. Follow-up postoperative course, PT OT evaluation.  Hyponatremia: Patient does have chronic hyponatremia.   Baseline sodium runs between 127-130. Continue gentle IV hydration  GERD :  continue Protonix  Severe vitamin D deficiency: Patient is not taking any vitamin D supplements. Oral vitamin D replacement.  Hypothyroidism : Continue Levothyroxine  Hyperlipidemia :  continue rosuvastatin  Essential hypertension :  resume home blood pressure  medications  History of alcohol abuse: Patient reported lasting was 2 weeks ago.  Continue CIWA protocol.  History of chronic pancreatitis :  continue Creon.  DVT prophylaxis: Subcu heparin Code Status: Full code Family Communication: No family at bedside  Disposition Plan:   Status is: Inpatient  Remains inpatient appropriate because:Inpatient level of care appropriate due to severity of illness  Dispo: The patient is from: Home              Anticipated d/c is to: SNF              Patient currently is not medically stable to d/c.   Difficult to place patient No   Consultants:  Orthopedics  Procedures: Left total hip hemiarthroplasty   antimicrobials:   Anti-infectives (From admission, onward)    Start     Dose/Rate Route Frequency Ordered Stop   04/23/21 0745  ceFAZolin (ANCEF) IVPB 2g/100 mL premix        2 g 200 mL/hr over 30 Minutes Intravenous On call to O.R. 04/23/21 0102 04/24/21 0559        Subjective: Patient is seen and examined at bedside.  Overnight events noted.   Patient reports having pain in the left hip.  Objective: Vitals:   04/23/21 0700 04/23/21 1118 04/23/21 1146 04/23/21 1344  BP: 135/75 (!) 156/83 (!) 156/83 (!) 196/88  Pulse: 83 77 77 76  Resp: 17  18 18   Temp: 98.6 F (37 C)  98.4 F (36.9 C) 98.3 F (36.8 C)  TempSrc: Oral  Oral Oral  SpO2: 100%  100% 99%  Weight:      Height:  Intake/Output Summary (Last 24 hours) at 04/23/2021 1556 Last data filed at 04/22/2021 1900 Gross per 24 hour  Intake --  Output 200 ml  Net -200 ml   Filed Weights   04/21/21 0739  Weight: 60.3 kg    Examination:  General exam: Appears calm and comfortable, appears in a lot of pain. Respiratory system: Clear to auscultation. Respiratory effort normal. Cardiovascular system: S1 & S2 heard, RRR. No JVD, murmurs, rubs, gallops or clicks. No pedal edema. Gastrointestinal system: Abdomen is nondistended, soft and nontender. No organomegaly or  masses felt. Normal bowel sounds heard. Central nervous system: Alert and oriented. No focal neurological deficits. Extremities: Left leg externally rotated.  Significant tenderness in the left hip area. Skin: No rashes, lesions or ulcers Psychiatry: Judgement and insight appear normal. Mood & affect appropriate.     Data Reviewed: I have personally reviewed following labs and imaging studies  CBC: Recent Labs  Lab 04/21/21 0841 04/23/21 0110  WBC 9.7 8.5  NEUTROABS 7.4 5.9  HGB 11.1* 10.5*  HCT 34.3* 30.7*  MCV 96.9 94.2  PLT 270 296   Basic Metabolic Panel: Recent Labs  Lab 04/21/21 0841 04/22/21 0536 04/23/21 0110  NA 127* 129* 128*  K 3.8 3.3* 4.0  CL 92* 97* 95*  CO2 28 24 25   GLUCOSE 122* 113* 122*  BUN 10 10 7*  CREATININE 0.68 0.59 0.56  CALCIUM 9.5 8.9 9.0  MG  --  2.0  --   PHOS  --  3.6  --    GFR: Estimated Creatinine Clearance: 64.8 mL/min (by C-G formula based on SCr of 0.56 mg/dL). Liver Function Tests: Recent Labs  Lab 04/22/21 0536  AST 18  ALT 11  ALKPHOS 50  BILITOT 1.1  PROT 7.6  ALBUMIN 3.7   No results for input(s): LIPASE, AMYLASE in the last 168 hours. No results for input(s): AMMONIA in the last 168 hours. Coagulation Profile: Recent Labs  Lab 04/21/21 0841  INR 1.2   Cardiac Enzymes: No results for input(s): CKTOTAL, CKMB, CKMBINDEX, TROPONINI in the last 168 hours. BNP (last 3 results) No results for input(s): PROBNP in the last 8760 hours. HbA1C: No results for input(s): HGBA1C in the last 72 hours. CBG: No results for input(s): GLUCAP in the last 168 hours. Lipid Profile: Recent Labs    04/22/21 0536  CHOL 160  HDL 78  LDLCALC 75  TRIG 37  CHOLHDL 2.1   Thyroid Function Tests: No results for input(s): TSH, T4TOTAL, FREET4, T3FREE, THYROIDAB in the last 72 hours. Anemia Panel: No results for input(s): VITAMINB12, FOLATE, FERRITIN, TIBC, IRON, RETICCTPCT in the last 72 hours. Sepsis Labs: No results for  input(s): PROCALCITON, LATICACIDVEN in the last 168 hours.  Recent Results (from the past 240 hour(s))  Resp Panel by RT-PCR (Flu A&B, Covid) Nasopharyngeal Swab     Status: None   Collection Time: 04/21/21  8:17 AM   Specimen: Nasopharyngeal Swab; Nasopharyngeal(NP) swabs in vial transport medium  Result Value Ref Range Status   SARS Coronavirus 2 by RT PCR NEGATIVE NEGATIVE Final    Comment: (NOTE) SARS-CoV-2 target nucleic acids are NOT DETECTED.  The SARS-CoV-2 RNA is generally detectable in upper respiratory specimens during the acute phase of infection. The lowest concentration of SARS-CoV-2 viral copies this assay can detect is 138 copies/mL. A negative result does not preclude SARS-Cov-2 infection and should not be used as the sole basis for treatment or other patient management decisions. A negative result may occur with  improper specimen collection/handling, submission of specimen other than nasopharyngeal swab, presence of viral mutation(s) within the areas targeted by this assay, and inadequate number of viral copies(<138 copies/mL). A negative result must be combined with clinical observations, patient history, and epidemiological information. The expected result is Negative.  Fact Sheet for Patients:  BloggerCourse.com  Fact Sheet for Healthcare Providers:  SeriousBroker.it  This test is no t yet approved or cleared by the [place] FDA and  has been authorized for detection and/or diagnosis of SARS-CoV-2 by FDA under an Emergency Use Authorization (EUA). This EUA will remain  in effect (meaning this test can be used) for the duration of the COVID-19 declaration under Section 564(b)(1) of the Act, 21 U.S.C.section 360bbb-3(b)(1), unless the authorization is terminated  or revoked sooner.       Influenza A by PCR NEGATIVE NEGATIVE Final   Influenza B by PCR NEGATIVE NEGATIVE Final    Comment: (NOTE) The  Xpert Xpress SARS-CoV-2/FLU/RSV plus assay is intended as an aid in the diagnosis of influenza from Nasopharyngeal swab specimens and should not be used as a sole basis for treatment. Nasal washings and aspirates are unacceptable for Xpert Xpress SARS-CoV-2/FLU/RSV testing.  Fact Sheet for Patients: BloggerCourse.com  Fact Sheet for Healthcare Providers: SeriousBroker.it  This test is not yet approved or cleared by the [place] FDA and has been authorized for detection and/or diagnosis of SARS-CoV-2 by FDA under an Emergency Use Authorization (EUA). This EUA will remain in effect (meaning this test can be used) for the duration of the COVID-19 declaration under Section 564(b)(1) of the Act, 21 U.S.C. section 360bbb-3(b)(1), unless the authorization is terminated or revoked.  Performed at [facility], [address], [place], [place] [address]     Radiology Studies: DG Pelvis 1-2 Views  Result Date: 04/22/2021 CLINICAL DATA:  Known femoral neck fracture EXAM: PELVIS - 1-2 VIEW COMPARISON:  Hip radiographs obtained earlier the same day FINDINGS: Again seen is a displaced fracture of the left femoral neck with superior and mild lateral displacement of the distal fragment. Varus angulation appears slightly increased from prior, though this may be positional. No other fracture is seen.  Right hip alignment is normal. Vascular calcifications are noted. IMPRESSION: Left femoral neck fracture is again seen. Apparent increase in varus angulation may be positional. Electronically Signed   By: Lesia Hausen MD   On: 04/22/2021 15:11   DG Femur Min 2 Views Left  Result Date: 04/22/2021 CLINICAL DATA:  Re-evaluate fracture EXAM: LEFT FEMUR 2 VIEWS COMPARISON:  X-ray dated April 21, 2021 FINDINGS: Redemonstrated left femoral neck fracture with 3.0 cm of superior displacement, previously 2.0 cm. Unchanged varus angulation. No new fractures  identified. IMPRESSION: Left femoral neck fracture with increased superior displacement compared to prior exam. Electronically Signed   By: Allegra Lai MD   On: 04/22/2021 15:12    Scheduled Meds:  [HQI Hold] folic acid  1 mg Oral Daily   [MAR Hold] levothyroxine  50 mcg Oral QAC breakfast   [MAR Hold] LORazepam  0-4 mg Intravenous Q12H   [MAR Hold] metoprolol tartrate  50 mg Oral BID   [MAR Hold] multivitamin with minerals  1 tablet Oral Daily   [MAR Hold] rosuvastatin  5 mg Oral QPM   [MAR Hold] thiamine  100 mg Oral Daily   Or   [MAR Hold] thiamine  100 mg Intravenous Daily   tranexamic acid (CYKLOKAPRON) topical - INTRAOP  2,000 mg Topical To OR   [MAR Hold]  Vitamin D (Ergocalciferol)  50,000 Units Oral Q7 days   Continuous Infusions:  sodium chloride 50 mL/hr at 04/22/21 1813    ceFAZolin (ANCEF) IV     lactated ringers 10 mL/hr at 04/23/21 1411   tranexamic acid       LOS: 2 days    Time spent: 25 mins    Mehran Guderian, MD [facility]   If 7PM-7AM, please contact night-coverage

## 2021-04-23 NOTE — Brief Op Note (Signed)
   04/23/2021  6:55 PM  PATIENT:  Judy Davis  65 y.o. female  PRE-OPERATIVE DIAGNOSIS:  Left Hip Fracture Post op dx - left hip fracture  PROCEDURE:  Procedure(s): LEFT TOTAL HIP ARTHROPLASTY ANTERIOR APPROACH  SURGEON:  Surgeon(s): Meredith Pel, MD  ASSISTANT: magnant pa  ANESTHESIA:   general  EBL: 150 ml    Total I/O In: 1850 [I.V.:1600; IV Piggyback:250] Out: [id number] [Urine:305; Blood:150]  BLOOD ADMINISTERED: none  DRAINS: none   LOCAL MEDICATIONS USED:  vanco marcaine mso4  SPECIMEN:  No Specimen  COUNTS:  YES  TOURNIQUET:  * No tourniquets in log *  DICTATION: .Other Dictation: Dictation Number [id number]  PLAN OF CARE: Admit to inpatient   PATIENT DISPOSITION:  PACU - hemodynamically stable

## 2021-04-23 NOTE — Anesthesia Postprocedure Evaluation (Signed)
Anesthesia Post Note  Patient: Judy Davis  Procedure(s) Performed: LEFT TOTAL HIP ARTHROPLASTY ANTERIOR APPROACH (Left: Hip)     Patient location during evaluation: PACU Anesthesia Type: General Level of consciousness: awake and alert Pain management: pain level controlled Vital Signs Assessment: post-procedure vital signs reviewed and stable Respiratory status: spontaneous breathing, nonlabored ventilation, respiratory function stable and patient connected to nasal cannula oxygen Cardiovascular status: blood pressure returned to baseline and stable Postop Assessment: no apparent nausea or vomiting Anesthetic complications: no   No notable events documented.  Last Vitals:  Vitals:   04/23/21 1945 04/23/21 2000  BP: 133/69 128/70  Pulse: 78 82  Resp: 10 12  Temp:  (!) 36.2 C  SpO2: 100% 100%    Last Pain:  Vitals:   04/23/21 2000  TempSrc:   PainSc: Asleep                 Effie Berkshire

## 2021-04-23 NOTE — Anesthesia Preprocedure Evaluation (Signed)
Anesthesia Evaluation  Patient identified by MRN, date of birth, ID band Patient awake    Reviewed: Allergy & Precautions, NPO status , Patient's Chart, lab work & pertinent test results  Airway Mallampati: II  TM Distance: >3 FB Neck ROM: Full    Dental  (+) Missing, Dental Advisory Given   Pulmonary neg pulmonary ROS,    Pulmonary exam normal breath sounds clear to auscultation       Cardiovascular hypertension, Normal cardiovascular exam Rhythm:Regular Rate:Normal     Neuro/Psych  Mild Altered mental status. Possibly due to long term alcohol abusenegative neurological ROS     GI/Hepatic GERD  ,(+)     substance abuse  alcohol use,   Endo/Other  Hypothyroidism   Renal/GU negative Renal ROS  negative genitourinary   Musculoskeletal negative musculoskeletal ROS (+)   Abdominal   Peds negative pediatric ROS (+)  Hematology  (+) anemia ,   Anesthesia Other Findings   Reproductive/Obstetrics negative OB ROS                             Anesthesia Physical Anesthesia Plan  ASA: 3  Anesthesia Plan: General   Post-op Pain Management:    Induction: Intravenous  PONV Risk Score and Plan: 3 and Ondansetron, Dexamethasone and Treatment may vary due to age or medical condition  Airway Management Planned: Oral ETT  Additional Equipment:   Intra-op Plan:   Post-operative Plan: Extubation in OR  Informed Consent: I have reviewed the patients History and Physical, chart, labs and discussed the procedure including the risks, benefits and alternatives for the proposed anesthesia with the patient or authorized representative who has indicated his/her understanding and acceptance.     Dental advisory given  Plan Discussed with: CRNA and Surgeon  Anesthesia Plan Comments:         Anesthesia Quick Evaluation

## 2021-04-23 NOTE — Anesthesia Procedure Notes (Signed)
Procedure Name: Intubation Date/Time: 04/23/2021 3:55 PM Performed by: Eligha Bridegroom, CRNA Pre-anesthesia Checklist: Patient identified, Emergency Drugs available, Suction available, Patient being monitored and Timeout performed Patient Re-evaluated:Patient Re-evaluated prior to induction Oxygen Delivery Method: Circle system utilized Preoxygenation: Pre-oxygenation with 100% oxygen Induction Type: IV induction Laryngoscope Size: Mac and 3 Grade View: Grade II Tube type: Oral Tube size: 7.0 mm Number of attempts: 1 Airway Equipment and Method: Stylet Placement Confirmation: ETT inserted through vocal cords under direct vision Secured at: 21 cm Tube secured with: Tape Dental Injury: Teeth and Oropharynx as per pre-operative assessment

## 2021-04-23 NOTE — Progress Notes (Signed)
Initial Nutrition Assessment  DOCUMENTATION CODES:   Not applicable  INTERVENTION:  RD will follow for diet advancement and add appropriate interventions   NUTRITION DIAGNOSIS:   Increased nutrient needs related to hip fracture, post-op healing as evidenced by NPO status.   GOAL:  Patient will meet greater than or equal to 90% of their needs   MONITOR:  Diet advancement, Supplement acceptance, Weight trends, I & O's, Labs, PO intake  REASON FOR ASSESSMENT:   Consult Hip fracture protocol  ASSESSMENT: Patient is a 65 yo female with history of Malnutrition, Hyponatremia, severe vitamin D deficiency, GERD, ETOH abuse, Chronic pancreatitis, HTN, HLD, Hypothyroidism. Fall at home resulting in closed left hip fracture.   She transferred from [facility] and is scheduled for surgery (left hip arthroplasty) this afternoon per MD.   Patient NPO for surgery. Dysphagia 3 diet 8/7 -no meal documentation.   Weight gain noted since May 54.4 kg to current 60.3 kg.    Intake/Output Summary (Last 24 hours) at 04/23/2021 0956 Last data filed at 04/22/2021 1900 Gross per 24 hour  Intake 1415 ml  Output 200 ml  Net 1215 ml      Medications reviewed and include: Folic acid, MVI, Thiamine, Vitamin D 2. Per MD pt not taking Creon.   Labs:  BMP Latest Ref Rng & Units 04/23/2021 04/22/2021 04/21/2021  Glucose 70 - 99 mg/dL 122(H) 113(H) 122(H)  BUN 8 - 23 mg/dL 7(L) 10 10  Creatinine 0.44 - 1.00 mg/dL 0.56 0.59 0.68  BUN/Creat Ratio 6 - 22 (calc) - - -  Sodium 135 - 145 mmol/L 128(L) 129(L) 127(L)  Potassium 3.5 - 5.1 mmol/L 4.0 3.3(L) 3.8  Chloride 98 - 111 mmol/L 95(L) 97(L) 92(L)  CO2 22 - 32 mmol/L '25 24 28  '$ Calcium 8.9 - 10.3 mg/dL 9.0 8.9 9.5     NUTRITION - FOCUSED PHYSICAL EXAM:  Unable to complete Nutrition-Focused physical exam at this time. RD working remotely.  Diet Order:   Diet Order             Diet NPO time specified Except for: Sips with Meds  Diet effective midnight                    EDUCATION NEEDS:  Not appropriate for education at this time  Skin:  Skin Assessment: Reviewed RN Assessment  Last BM:  Prior to admission?  Height:   Ht Readings from Last 1 Encounters:  04/21/21 '5\' 6"'$  (1.676 m)    Weight:   Wt Readings from Last 1 Encounters:  04/21/21 60.3 kg    Ideal Body Weight:   59 kg  BMI:  Body mass index is 21.47 kg/m.  Estimated Nutritional Needs:   Kcal:  1980-2100  Protein:  84-90 gr  Fluid:  1800 ml daily   Colman Cater MS,RD,CSG,LDN Contact: Shea Evans

## 2021-04-24 ENCOUNTER — Encounter (HOSPITAL_COMMUNITY): Payer: Self-pay | Admitting: Orthopedic Surgery

## 2021-04-24 DIAGNOSIS — S72002D Fracture of unspecified part of neck of left femur, subsequent encounter for closed fracture with routine healing: Secondary | ICD-10-CM | POA: Diagnosis not present

## 2021-04-24 LAB — BASIC METABOLIC PANEL
Anion gap: 8 (ref 5–15)
BUN: 6 mg/dL — ABNORMAL LOW (ref 8–23)
CO2: 23 mmol/L (ref 22–32)
Calcium: 8.8 mg/dL — ABNORMAL LOW (ref 8.9–10.3)
Chloride: 98 mmol/L (ref 98–111)
Creatinine, Ser: 0.62 mg/dL (ref 0.44–1.00)
GFR, Estimated: >60 mL/min (ref >60)
Glucose, Bld: 100 mg/dL — ABNORMAL HIGH (ref 70–99)
Potassium: 3.9 mmol/L (ref 3.5–5.1)
Sodium: 129 mmol/L — ABNORMAL LOW (ref 135–145)

## 2021-04-24 LAB — CBC
HCT: 28.1 % — ABNORMAL LOW (ref 36.0–46.0)
Hemoglobin: 8.9 g/dL — ABNORMAL LOW (ref 12.0–15.0)
MCH: 31 pg (ref 26.0–34.0)
MCHC: 31.7 g/dL (ref 30.0–36.0)
MCV: 97.9 fL (ref 80.0–100.0)
Platelets: 287 10*3/uL (ref 150–400)
RBC: 2.87 MIL/uL — ABNORMAL LOW (ref 3.87–5.11)
RDW: 12.7 % (ref 11.5–15.5)
WBC: 10 10*3/uL (ref 4.0–10.5)
nRBC: 0 % (ref 0.0–0.2)

## 2021-04-24 LAB — PHOSPHORUS: Phosphorus: 3.4 mg/dL (ref 2.5–4.6)

## 2021-04-24 LAB — MAGNESIUM: Magnesium: 1.6 mg/dL — ABNORMAL LOW (ref 1.7–2.4)

## 2021-04-24 MED ORDER — SENNOSIDES-DOCUSATE SODIUM 8.6-50 MG PO TABS
1.0000 | ORAL_TABLET | Freq: Two times a day (BID) | ORAL | Status: DC
  Administered 2021-04-24 – 2021-04-29 (×11): 1 via ORAL
  Filled 2021-04-24 (×11): qty 1

## 2021-04-24 MED ORDER — POLYETHYLENE GLYCOL 3350 17 G PO PACK
17.0000 g | PACK | Freq: Every day | ORAL | Status: DC
  Administered 2021-04-24 – 2021-04-29 (×6): 17 g via ORAL
  Filled 2021-04-24 (×6): qty 1

## 2021-04-24 MED ORDER — MAGNESIUM SULFATE 2 GM/50ML IV SOLN
2.0000 g | Freq: Once | INTRAVENOUS | Status: AC
  Administered 2021-04-24: 2 g via INTRAVENOUS
  Filled 2021-04-24: qty 50

## 2021-04-24 NOTE — Op Note (Signed)
NAMEAKEEMA, BOURRET MEDICAL RECORD NO: [id number] ACCOUNT NO: [id number] DATE OF BIRTH: 10/28/1955 FACILITY: [facility] LOCATION: [facility] PHYSICIAN: Yetta Barre. Marlou Sa, MD  Operative Report   PREOPERATIVE DIAGNOSIS:  Left hip femoral neck fracture.  POSTOPERATIVE DIAGNOSIS:  Left hip femoral neck fracture.  PROCEDURE:  Left total hip replacement, anterior approach.  IMPLANTS:  DePuy components, 48 Pinnacle cup, +4 liner, 32+9 ceramic head, ACTIS stem size 2.  SURGEON:  Meredith Pel, MD  ASSISTANT:  Annie Main, PA  INDICATIONS:  The patient is a 65 year old patient with left hip pain following fracture, presents for operative management after explanation of risks and benefits.  DESCRIPTION OF PROCEDURE:  The patient was brought to the operating room where general endotracheal anesthesia was induced.  Preoperative IV antibiotics were administered.  Timeout was called.  The patient was placed on the Hana bed with the left hip  under some traction.  Left hip was then prescrubbed with alcohol and Betadine, allowed to air dry, prepped with DuraPrep solution, and draped in a sterile manner.  Charlie Pitter was used to scrub the operative field.  Timeout was called. Tranexamic acid was  given.  An incision was made 2 cm posterior and distal to the anterior superior iliac crest.  Skin and subcutaneous tissue were sharply divided.  Fascia lata was encountered and branch of the lateral femoral cutaneous nerve was protected when possible  fascia over the tensor fascia lata was opened.  Plane developed between the tensor fascia lata and the rectus.  Crossing vessels were coagulated.  Blunt acetabular retractors were placed on the inferior and superior aspect of the femoral neck.  The  capsule over the femoral neck was T'd and marked with #1 Ethibond suture.  Head was removed.  Femoral neck cut was performed back to about a centimeter above the lesser.  At this time, some traction and external rotation were  placed.  Reaming was then  performed on the acetabulum up to a size 47.  A 48 cup was then placed with excellent purchase obtained.  Cup was placed in about 40 degrees of abduction and 10 degrees of anteversion.  Next, a +4 liner was placed.  Attention was then directed towards  the femur.  Femoral lift was placed.  Actually, the leg was externally rotated.  Then, adduction and extension were performed.  Next, the femur was broached up to a size 2 and then multiple ball sizers were placed.  The 9 gave very good stability with 50  degrees of external rotation and 50 degrees of extension.  True stem was placed along with a +9 ball.  Leg lengths were approximately equal.  Thorough irrigation was then performed.  IrriSept solution used after the incision, after arthrotomy, and  multiple times during the case.  Next, the capsule was closed using #1 Vicryl suture over vancomycin powder, which was placed.  Skin was anesthetized with Marcaine, morphine, clonidine.  Fascia lata was then closed using #1 Vicryl suture followed by  interrupted inverted 0 Vicryl suture, 2-0 Vicryl suture, and 3-0 Monocryl with Steri-Strips and Aquacel dressing applied.  Luke's assistance was required at all times for retraction, opening, closing, mobilization of tissue.  His assistance was a medical necessity.   ROH D: 04/23/2021 7:01:06 pm T: 04/23/2021 10:54:00 pm  JOB: [id number]

## 2021-04-24 NOTE — TOC CAGE-AID Note (Signed)
Transition of Care [facility]) - CAGE-AID Screening   Patient Details  Name: MARTEEN CATA MRN: [id number] Date of Birth: [birth year]  Transition of Care [facility]) CM/SW Contact:    Carrah Eppolito C Tarpley-Carter, [place] Phone Number: 04/24/2021, 3:22 PM   Clinical Narrative:  Pt participated in [place].  Pt stated she stopped using ETOH 4 months ago, and does not use substance.  Pt was offered resources.  Pt stated they did not feel that they were in need of resources at this time.   King Pinzon Tarpley-Carter, MSW, LCSW-A Pronouns:  She/Her/Hers [facility] HealthTransitions of Care Clinical Social Worker Direct Number:  [phone] [email or web address]   CAGE-AID Screening:    Have You Ever Felt You Ought to Cut Down on Your Drinking or Drug Use?: No Have People Annoyed You By [employer] Your Drinking Or Drug Use?: No   Have You Ever Had a Drink or Used Drugs First Thing In The Morning to Steady Your Nerves or to Get Rid of a Hangover?: No    Substance Abuse Education Offered: No

## 2021-04-24 NOTE — Progress Notes (Signed)
Went to checked pt, noted foley out. No bleeding noted. Asked pt what happened, pt stated she does not know how it got out.

## 2021-04-24 NOTE — Progress Notes (Signed)
  Subjective: Judy Davis is a 65 y.o. female s/p left THA.  They are POD 1.  Pt's pain is controlled.  Pt has ambulated with some difficulty.   Denies any urinary complaints, chest pain, shortness of breath, calf pain.  She did have cloudy appearing urine yesterday with Foley placement and urine culture still pending.  Objective: Vital signs in last 24 hours: Temp:  [97 F (36.1 C)-99.1 F (37.3 C)] 99.1 F (37.3 C) (08/10 0834) Pulse Rate:  [76-100] 100 (08/10 0834) Resp:  [10-49] 16 (08/10 0834) BP: (122-196)/(66-88) 145/78 (08/10 0834) SpO2:  [99 %-100 %] 99 % (08/10 0834)  Intake/Output from previous day: 08/09 0701 - 08/10 0700 In: 3091.1 [P.O.:120; I.V.:2521.1; IV Piggyback:450] Out: 905 [Urine:755; Blood:150] Intake/Output this shift: No intake/output data recorded.  Exam:  No gross blood or drainage overlying the dressing 2+ DP pulse Sensation intact distally in the left foot Able to dorsiflex and plantarflex the left foot No calf tenderness   Labs: Recent Labs    04/23/21 0110 04/24/21 0041  HGB 10.5* 8.9*   Recent Labs    04/23/21 0110 04/24/21 0041  WBC 8.5 10.0  RBC 3.26* 2.87*  HCT 30.7* 28.1*  PLT 296 287   Recent Labs    04/23/21 0110 04/24/21 0041  NA 128* 129*  K 4.0 3.9  CL 95* 98  CO2 25 23  BUN 7* 6*  CREATININE 0.56 0.62  GLUCOSE 122* 100*  CALCIUM 9.0 8.8*   No results for input(s): LABPT, INR in the last 72 hours.  Assessment/Plan: Pt is POD 1 s/p left THA.    -Plan to discharge to home versus SNF in coming days pending patient's pain and PT eval  -WBAT with a walker  -Follow-up with Dr. Marlou Sa 2 weeks postoperatively.  Awaiting urine culture results.      Tahir Blank L Nadira Single 04/24/2021, 9:15 AM

## 2021-04-24 NOTE — Evaluation (Signed)
Physical Therapy Evaluation Patient Details Name: Judy Davis MRN: [id number] DOB: 06-26-1956 Today's Date: 04/24/2021   History of Present Illness  65 yo female with onset of L femoral neck fracture from a trip at home now has L THA, direct anterior approach with WBAT.  Home with husband, has had issues of EtOH.  PMHx:  Vit D deficiency, EtOH use, HTN, hyponatremia, HLD, malnutrition, hypothyroidism,  Clinical Impression  Pt was seen for mobility on side of bed to use walker and get to chair.  Pt is having some difficulty with impulsivity and struggling to get up when PT is not ready to move her.  Finally got her to chair with care as pt is attempting to sit suddenly mult times during the transition, which would make two person help more ideal.  Follow along with her to increase safety and independence and reduce time needed to stay in SNF for return home.    Follow Up Recommendations SNF    Equipment Recommendations  None recommended by PT    Recommendations for Other Services       Precautions / Restrictions Precautions Precautions: Fall Precaution Comments: confusion Restrictions Weight Bearing Restrictions: Yes LLE Weight Bearing: Weight bearing as tolerated      Mobility  Bed Mobility Overal bed mobility: Needs Assistance Bed Mobility: Supine to Sit     Supine to sit: Min assist     General bed mobility comments: min assist but mainly help to move LLE    Transfers Overall transfer level: Needs assistance Equipment used: Rolling walker (2 wheeled);1 person hand held assist Transfers: Sit to/from [facility] Sit to Stand: Mod assist Stand pivot transfers: Mod assist;Max assist       General transfer comment: mod to max assist to power up and control balance  Ambulation/Gait Ambulation/Gait assistance: Min assist;Mod assist Gait Distance (Feet): 5 Feet Assistive device: Rolling walker (2 wheeled);1 person hand held assist Gait  Pattern/deviations: Step-to pattern;Decreased stride length;Decreased stance time - left;Decreased weight shift to left;Wide base of support Gait velocity: reduce   General Gait Details: pt is stepping with anxiety and agitation, trying to rush to get to chair but with extra time is more stable  Stairs            Wheelchair Mobility    Modified Rankin (Stroke Patients Only)       Balance Overall balance assessment: History of Falls;Needs assistance   Sitting balance-Leahy Scale: Good Sitting balance - Comments: good to handle side of bed but requires close S due to confusion   Standing balance support: Bilateral upper extremity supported;During functional activity Standing balance-Leahy Scale: Poor                               Pertinent Vitals/Pain Pain Assessment: Faces Faces Pain Scale: Hurts little more Pain Location: L hip Pain Descriptors / Indicators: Operative site guarding Pain Intervention(s): Limited activity within patient's tolerance;Monitored during session;Premedicated before session;Repositioned    Home Living Family/patient expects to be discharged to:: Private residence Living Arrangements: Spouse/significant other Available Help at Discharge: Family;Available 24 hours/day Type of Home: House       Home Layout: One level Home Equipment: Walker - 2 wheels Additional Comments: pt was distracted and not able to give a history    Prior Function Level of Independence: Needs assistance   Gait / Transfers Assistance Needed: pt could not give details but did walk  ADL's /  Homemaking Assistance Needed: home with husband        Hand Dominance        Extremity/Trunk Assessment   Upper Extremity Assessment Upper Extremity Assessment: Overall WFL for tasks assessed    Lower Extremity Assessment Lower Extremity Assessment: LLE deficits/detail LLE Deficits / Details: weakness in hip and cannot move without help LLE Coordination:  decreased gross motor    Cervical / Trunk Assessment Cervical / Trunk Assessment: Kyphotic  Communication   Communication: No difficulties  Cognition Arousal/Alertness: Awake/alert Behavior During Therapy: Impulsive Overall Cognitive Status: No family/caregiver present to determine baseline cognitive functioning                                 General Comments: pt is distracted and poor historian but can assist to move      General Comments General comments (skin integrity, edema, etc.): pt demonstrates better control of standing balance with dense cues and assist, but is also impulsive without dense repetitive instructions    Exercises     Assessment/Plan    PT Assessment Patient needs continued PT services  PT Problem List Decreased strength;Decreased range of motion;Decreased activity tolerance;Decreased balance;Decreased mobility;Decreased coordination;Decreased cognition;Decreased knowledge of use of DME;Decreased safety awareness;Decreased knowledge of precautions;Cardiopulmonary status limiting activity;Decreased skin integrity;Pain       PT Treatment Interventions DME instruction;Gait training;Functional mobility training;Therapeutic activities;Therapeutic exercise;Balance training;Neuromuscular re-education;Patient/family education    PT Goals (Current goals can be found in the Care Plan section)  Acute Rehab PT Goals Patient Stated Goal: to get up to chair PT Goal Formulation: With patient Potential to Achieve Goals: Good    Frequency 7X/week   Barriers to discharge Decreased caregiver support home with husband    Co-evaluation               AM-PAC PT "6 Clicks" Mobility  Outcome Measure Help needed turning from your back to your side while in a flat bed without using bedrails?: A Lot Help needed moving from lying on your back to sitting on the side of a flat bed without using bedrails?: A Lot Help needed moving to and from a bed to a  chair (including a wheelchair)?: A Lot Help needed standing up from a chair using your arms (e.g., wheelchair or bedside chair)?: A Lot Help needed to walk in hospital room?: A Lot Help needed climbing 3-5 steps with a railing? : Total 6 Click Score: 11    End of Session Equipment Utilized During Treatment: Gait belt Activity Tolerance: Patient limited by fatigue;Patient limited by pain;Treatment limited secondary to medical complications (Comment) Patient left: in chair;with call bell/phone within reach;with chair alarm set Nurse Communication: Mobility status PT Visit Diagnosis: Unsteadiness on feet (R26.81);Muscle weakness (generalized) (M62.81);Difficulty in walking, not elsewhere classified (R26.2)    Time: [id number] PT Time Calculation (min) (ACUTE ONLY): 24 min   Charges:   PT Evaluation $PT Eval Moderate Complexity: 1 Mod PT Treatments $Therapeutic Activity: 8-22 mins       Ramond Dial 04/24/2021, 3:53 PM  Mee Hives, PT MS Acute Rehab Dept. Number: [place]

## 2021-04-24 NOTE — Progress Notes (Signed)
[facility] Progress Note  Patient: Judy Davis    [id number]  DOA: 04/21/2021     Date of Service: the patient was seen and examined on 04/24/2021  Brief hospital course: This 65 year old female with PMH significant for chronic alcoholism(last drink was 2 weeks ago), vitamin D deficiency , essential hypertension, chronic hyponatremia, hyperlipidemia, gait instability and moderate to severe malnutrition reportedly fell in her kitchen yesterday, tripping over her feet.  She lives with her husband.  She was unable to get up after the fall.  She denies any head injury.  She reports her family put her on couch where she remained for 24 hours,  she continued to complain of left hip pain and thigh pain.  x-ray shows left hip with left femoral neck fracture.  Patient was transferred from [facility] for possible open reduction and internal fixation.  Currently plan is monitor for postop recovery.  Subjective: Pain well controlled.  No nausea no vomiting.  Remains fatigue and tired and therefore unable to work with PT today.  Assessment and Plan: Left femoral neck fracture: Patient presented s/p fall with left femoral neck fracture. Adequate pain control Orthopedic consulted, Underwent total left hip hemiarthroplasty. Follow-up postoperative course, PT OT evaluation.  Postoperative acute blood loss anemia. Based on hemoglobin around 10. Postoperatively hemoglobin around 9. Will monitor H&H. Transfuse for hemoglobin less than 8.   Hyponatremia: Patient does have chronic hyponatremia.   Baseline sodium runs between 127-130. Treated with IV hydration.  Will discontinue.   GERD :  continue Protonix   Severe vitamin D deficiency: Patient is not taking any vitamin D supplements. Oral vitamin D replacement.   Hypothyroidism : Continue Levothyroxine   Hyperlipidemia :  continue rosuvastatin   Essential hypertension :  resume home blood pressure medications   History  of alcohol abuse: Patient reported lasting was 2 weeks ago.  Continue CIWA protocol.   History of chronic pancreatitis :  continue Creon.  Scheduled Meds:  acetaminophen  500 mg Oral Q6H   aspirin  81 mg Oral BID   Chlorhexidine Gluconate Cloth  6 each Topical Daily   folic acid  1 mg Oral Daily   levothyroxine  50 mcg Oral QAC breakfast   LORazepam  0-4 mg Intravenous Q12H   metoprolol tartrate  50 mg Oral BID   multivitamin with minerals  1 tablet Oral Daily   polyethylene glycol  17 g Oral Daily   rosuvastatin  5 mg Oral QPM   senna-docusate  1 tablet Oral BID   thiamine  100 mg Oral Daily   Or   thiamine  100 mg Intravenous Daily   Vitamin D (Ergocalciferol)  50,000 Units Oral Q7 days   Continuous Infusions:  methocarbamol (ROBAXIN) IV     PRN Meds: acetaminophen, hydrALAZINE, HYDROcodone-acetaminophen, menthol-cetylpyridinium **OR** phenol, methocarbamol **OR** methocarbamol (ROBAXIN) IV, morphine injection, ondansetron **OR** ondansetron (ZOFRAN) IV  Body mass index is 21.47 kg/m.  Nutrition Problem: Increased nutrient needs Etiology: hip fracture, post-op healing     DVT Prophylaxis:   SCDs Start: 04/23/21 2025 Place TED hose Start: 04/23/21 2025    Advance goals of care discussion: Pt is Full code.  Family Communication: no family was present at bedside, at the time of interview.   Data Reviewed: I have personally reviewed and interpreted daily labs, tele strips, imaging. Hemoglobin 8.9.  Electrolytes stable.  Physical Exam:  General: Appear in mild distress, no Rash; Oral Mucosa Clear, moist. no Abnormal Neck Mass Or lumps, Conjunctiva  normal  Cardiovascular: S1 and S2 Present, no Murmur, Respiratory: good respiratory effort, Bilateral Air entry present and CTA, no Crackles, no wheezes Abdomen: Bowel Sound present, Soft and no tenderness Extremities: no Pedal edema Neurology: alert and oriented to time, place, and person affect appropriate. no new focal  deficit Gait not checked due to patient safety concerns  Vitals:   04/24/21 0403 04/24/21 0834 04/24/21 1214 04/24/21 1557  BP: 127/66 (!) 145/78 (!) 152/80 137/62  Pulse: 85 100 92 91  Resp: '16 16 16 16  '$ Temp: 99 F (37.2 C) 99.1 F (37.3 C) 99 F (37.2 C) 100 F (37.8 C)  TempSrc: Oral Oral Oral Oral  SpO2: 100% 99% 100% 100%  Weight:      Height:        Disposition:  Status is: Inpatient  Remains inpatient appropriate because:Ongoing active pain requiring inpatient pain management and Unsafe d/c plan  Dispo: The patient is from: Home              Anticipated d/c is to: SNF              Patient currently is not medically stable to d/c.   Difficult to place patient No        Time spent: 35 minutes. I reviewed all nursing notes, pharmacy notes, vitals, pertinent old records. I have discussed plan of care as described above with RN.  Author: Berle Mull, MD [facility] 04/24/2021 6:52 PM  To reach On-call, see care teams to locate the attending and reach out via [email or web address]. Between 7PM-7AM, please contact night-coverage If you still have difficulty reaching the attending provider, please page the [facility] (Director on Call) for [facility] on amion for assistance.

## 2021-04-24 NOTE — Addendum Note (Signed)
Addendum  created 04/24/21 1551 by Myrtie Soman, MD   Intraprocedure Staff edited

## 2021-04-24 NOTE — Progress Notes (Signed)
PT Cancellation Note  Patient Details Name: Judy Davis MRN: [id number] DOB: 01/24/1956   Cancelled Treatment:    Reason Eval/Treat Not Completed: Fatigue/lethargy limiting ability to participate.  Pt declines PT this AM, asking to wait and asked for help to extend LLE on the bed.  Follow up in PM to see if pt will start mobility.   Ramond Dial 04/24/2021, 11:39 AM Mee Hives, PT MS Acute Rehab Dept. Number: [place] and [place]

## 2021-04-25 DIAGNOSIS — S72002D Fracture of unspecified part of neck of left femur, subsequent encounter for closed fracture with routine healing: Secondary | ICD-10-CM | POA: Diagnosis not present

## 2021-04-25 LAB — CBC WITH DIFFERENTIAL/PLATELET
Abs Immature Granulocytes: 0.06 10*3/uL (ref 0.00–0.07)
Basophils Absolute: 0 10*3/uL (ref 0.0–0.1)
Basophils Relative: 0 %
Eosinophils Absolute: 0 10*3/uL (ref 0.0–0.5)
Eosinophils Relative: 0 %
HCT: 23.9 % — ABNORMAL LOW (ref 36.0–46.0)
Hemoglobin: 7.9 g/dL — ABNORMAL LOW (ref 12.0–15.0)
Immature Granulocytes: 1 %
Lymphocytes Relative: 14 %
Lymphs Abs: 1.5 10*3/uL (ref 0.7–4.0)
MCH: 31.2 pg (ref 26.0–34.0)
MCHC: 33.1 g/dL (ref 30.0–36.0)
MCV: 94.5 fL (ref 80.0–100.0)
Monocytes Absolute: 1.4 10*3/uL — ABNORMAL HIGH (ref 0.1–1.0)
Monocytes Relative: 13 %
Neutro Abs: 7.7 10*3/uL (ref 1.7–7.7)
Neutrophils Relative %: 72 %
Platelets: 255 10*3/uL (ref 150–400)
RBC: 2.53 MIL/uL — ABNORMAL LOW (ref 3.87–5.11)
RDW: 12.6 % (ref 11.5–15.5)
WBC: 10.7 10*3/uL — ABNORMAL HIGH (ref 4.0–10.5)
nRBC: 0 % (ref 0.0–0.2)

## 2021-04-25 LAB — BASIC METABOLIC PANEL
Anion gap: 12 (ref 5–15)
BUN: 5 mg/dL — ABNORMAL LOW (ref 8–23)
CO2: 24 mmol/L (ref 22–32)
Calcium: 8.8 mg/dL — ABNORMAL LOW (ref 8.9–10.3)
Chloride: 91 mmol/L — ABNORMAL LOW (ref 98–111)
Creatinine, Ser: 0.73 mg/dL (ref 0.44–1.00)
GFR, Estimated: >60 mL/min (ref >60)
Glucose, Bld: 102 mg/dL — ABNORMAL HIGH (ref 70–99)
Potassium: 3.5 mmol/L (ref 3.5–5.1)
Sodium: 127 mmol/L — ABNORMAL LOW (ref 135–145)

## 2021-04-25 MED ORDER — CEFAZOLIN SODIUM-DEXTROSE 1-4 GM/50ML-% IV SOLN
1.0000 g | Freq: Two times a day (BID) | INTRAVENOUS | Status: DC
  Administered 2021-04-25 (×2): 1 g via INTRAVENOUS
  Filled 2021-04-25 (×3): qty 50

## 2021-04-25 MED ORDER — NITROFURANTOIN MONOHYD MACRO 100 MG PO CAPS
100.0000 mg | ORAL_CAPSULE | Freq: Two times a day (BID) | ORAL | Status: DC
  Administered 2021-04-25 (×2): 100 mg via ORAL
  Filled 2021-04-25 (×3): qty 1

## 2021-04-25 MED ORDER — ENSURE ENLIVE PO LIQD
237.0000 mL | Freq: Three times a day (TID) | ORAL | Status: DC
  Administered 2021-04-25 – 2021-04-29 (×14): 237 mL via ORAL

## 2021-04-25 NOTE — Progress Notes (Signed)
Physical Therapy Treatment Patient Details Name: Judy Davis MRN: [id number] DOB: 1956-07-03 Today's Date: 04/25/2021    History of Present Illness 65 yo female with onset of L femoral neck fracture from a trip at home now has L THA, direct anterior approach with WBAT.  Home with husband, has had issues of EtOH.  PMHx:  Vit D deficiency, EtOH use, HTN, hyponatremia, HLD, malnutrition, hypothyroidism,    PT Comments    Pt supine in bed on arrival this session.  Pt required max cues for encouragement.  Focused on LLE exercises and progression of gt training.  Pt presented with LOB requiring mod assistance to correct balance.  Continue to recommend rehab in a post acute setting.    Follow Up Recommendations  SNF     Equipment Recommendations  None recommended by PT    Recommendations for Other Services       Precautions / Restrictions Precautions Precautions: Fall Precaution Comments: confusion Restrictions Weight Bearing Restrictions: Yes LLE Weight Bearing: Weight bearing as tolerated    Mobility  Bed Mobility Overal bed mobility: Needs Assistance Bed Mobility: Supine to Sit;Sit to Supine     Supine to sit: Min assist     General bed mobility comments: Min assistance to advance LLE to edge of bed.  Pt required increased time and effort this session.    Transfers Overall transfer level: Needs assistance Equipment used: Rolling walker (2 wheeled) Transfers: Sit to/from Stand Sit to Stand: Min assist         General transfer comment: Cues for hand placement to rise into standing.  Ambulation/Gait Ambulation/Gait assistance: Min assist;Mod assist Gait Distance (Feet): 30 Feet Assistive device: Rolling walker (2 wheeled) Gait Pattern/deviations: Step-to pattern;Decreased stride length;Decreased stance time - left;Decreased weight shift to left Gait velocity: reduce   General Gait Details: Cues for sequencing RW safety and posture.  Pt able to progress gt  distance this session.  Demonstrates LOB posterior when turning and back to recliner, she removed hand and attempted to reach for recliner too soon heavy mod assistance to correct.   Stairs             Wheelchair Mobility    Modified Rankin (Stroke Patients Only)       Balance Overall balance assessment: History of Falls;Needs assistance   Sitting balance-Leahy Scale: Good       Standing balance-Leahy Scale: Poor                              Cognition Arousal/Alertness: Awake/alert Behavior During Therapy: Impulsive;WFL for tasks assessed/performed Overall Cognitive Status: No family/caregiver present to determine baseline cognitive functioning                                 General Comments: Required repeated cueing during session.      Exercises General Exercises - Lower Extremity Ankle Circles/Pumps: AROM;Both;20 reps;Supine Quad Sets: AROM;Left;10 reps;Supine Heel Slides: Left;10 reps;Supine;AAROM Hip ABduction/ADduction: AAROM;Left;10 reps;Supine    General Comments        Pertinent Vitals/Pain Pain Assessment: Faces Faces Pain Scale: Hurts little more Pain Location: L hip Pain Descriptors / Indicators: Operative site guarding Pain Intervention(s): Monitored during session;Repositioned    Home Living                      Prior Function  PT Goals (current goals can now be found in the care plan section) Acute Rehab PT Goals Patient Stated Goal: to get up to chair Potential to Achieve Goals: Good Progress towards PT goals: Progressing toward goals    Frequency    7X/week      PT Plan Current plan remains appropriate    Co-evaluation              AM-PAC PT "6 Clicks" Mobility   Outcome Measure  Help needed turning from your back to your side while in a flat bed without using bedrails?: A Little Help needed moving from lying on your back to sitting on the side of a flat bed without  using bedrails?: A Little Help needed moving to and from a bed to a chair (including a wheelchair)?: A Little Help needed standing up from a chair using your arms (e.g., wheelchair or bedside chair)?: A Little Help needed to walk in hospital room?: A Lot Help needed climbing 3-5 steps with a railing? : Total 6 Click Score: 15    End of Session Equipment Utilized During Treatment: Gait belt Activity Tolerance: Patient limited by fatigue;Patient limited by pain;Treatment limited secondary to medical complications (Comment) Patient left: in chair;with call bell/phone within reach;with chair alarm set Nurse Communication: Mobility status PT Visit Diagnosis: Unsteadiness on feet (R26.81);Muscle weakness (generalized) (M62.81);Difficulty in walking, not elsewhere classified (R26.2)     Time: 4098-1191 PT Time Calculation (min) (ACUTE ONLY): 22 min  Charges:  $Gait Training: 8-22 mins                     Bonney Leitz , PTA Acute Rehabilitation Services Pager [phone] Office [phone]    Demonta Wombles Artis Delay 04/25/2021, 1:57 PM

## 2021-04-25 NOTE — Plan of Care (Signed)

## 2021-04-25 NOTE — Care Management Important Message (Signed)
Important Message  Patient Details  Name: Judy Davis MRN: [id number] Date of Birth: [birth year]   Medicare Important Message Given:  Yes     Grigor Lipschutz Montine Circle 04/25/2021, 3:03 PM

## 2021-04-25 NOTE — Progress Notes (Signed)
  Subjective: Judy Davis is a 65 y.o. female s/p left THA.  They are POD 2.  Pt's pain is controlled.  Pt has ambulated with some difficulty.   Physical therapy went okay today but she is being recommended to go to SNF.  She denies any chest pain, shortness of breath, calf pain.  Denies any urinary symptoms.  Objective: Vital signs in last 24 hours: Temp:  [97.6 F (36.4 C)-99.3 F (37.4 C)] 98.3 F (36.8 C) (08/11 1501) Pulse Rate:  [85-108] 86 (08/11 1501) Resp:  [14-17] 17 (08/11 1501) BP: (125-136)/(59-74) 129/67 (08/11 1501) SpO2:  [100 %] 100 % (08/11 1501)  Intake/Output from previous day: 08/10 0701 - 08/11 0700 In: 120 [P.O.:120] Out: 700 [Urine:700] Intake/Output this shift: Total I/O In: 170 [P.O.:120; IV Piggyback:50] Out: 300 [Urine:300]  Exam:  No gross blood or drainage overlying the dressing 1+ DP pulse Sensation intact distally in the left foot Able to dorsiflex and plantarflex the left foot No calf tenderness bilaterally No pain with logroll of the left lower extremity   Labs: Recent Labs    04/23/21 0110 04/24/21 0041 04/25/21 0115  HGB 10.5* 8.9* 7.9*   Recent Labs    04/24/21 0041 04/25/21 0115  WBC 10.0 10.7*  RBC 2.87* 2.53*  HCT 28.1* 23.9*  PLT 287 255   Recent Labs    04/24/21 0041 04/25/21 0115  NA 129* 127*  K 3.9 3.5  CL 98 91*  CO2 23 24  BUN 6* 5*  CREATININE 0.62 0.73  GLUCOSE 100* 102*  CALCIUM 8.8* 8.8*   No results for input(s): LABPT, INR in the last 72 hours.  Assessment/Plan: Pt is POD 2 s/p left THA.    -Plan to discharge to SNF in coming days pending patient's pain and PT eval  -WBAT with a walker  -Urine culture came back with E. coli and Enterococcus.  Sensitivities pending.  Started her on Macrobid in addition to the postoperative Ancef.  Plan to adjust antibiotics based on sensitivities.   Judy Davis 04/25/2021, 5:31 PM

## 2021-04-25 NOTE — NC FL2 (Signed)
[place] LEVEL OF CARE SCREENING TOOL     IDENTIFICATION  Patient Name: Judy Davis Birthdate: 1956-06-24 Sex: female Admission Date (Current Location): 04/21/2021  [facility] and [place] Number:  Herbalist and Address:  [employer] [place], [place] [address] [place], [place] [address]      Provider Number: [id number]  Attending Physician Name and Address:  Lavina Hamman, MD  Relative Name and Phone Number:       Current Level of Care: Hospital Recommended Level of Care: [place] Prior Approval Number:    Date Approved/Denied:   PASRR Number: [id number]  Discharge Plan: SNF    Current Diagnoses: Patient Active Problem List   Diagnosis Date Noted   Closed left hip fracture ([place]) 04/21/2021   Hypothyroidism    Hyperlipidemia    Chronic pancreatitis ([place]) 12/10/2020   Encounter for screening colonoscopy 12/10/2020   Elevated CA 19-9 level 07/18/2020   Acute kidney injury ([place])    Severe Vitamin D deficiency 02/28/2020   Malnutrition of moderate degree 02/28/2020   Generalized abdominal pain 02/27/2020   GERD (gastroesophageal reflux disease) 02/27/2020   Altered mental state 02/27/2020   Sinus tachycardia 02/27/2020   Acute on chronic pancreatitis ([place]) 02/27/2020   Pancreatic mass 02/27/2020   Pancreatic lesion 02/27/2020   Gait instability 08/10/2019   Alcohol abuse 08/08/2019   Intractable nausea and vomiting 08/08/2019   Intractable vomiting 08/07/2019   Hematoma 09/29/2016   Elevated AST (SGOT) 09/29/2016   Hyperglycemia 09/29/2016   Hypokalemia 09/29/2016   Upper respiratory infection 07/06/2011   Lower urinary tract infectious disease 07/06/2011   Nausea and vomiting 07/05/2011   Hyponatremia 07/05/2011   HTN (hypertension) 07/05/2011   Anemia 07/05/2011    Orientation RESPIRATION BLADDER Height & Weight     Self, Place  Normal Incontinent Weight: 133 lb (60.3 kg) Height:  '5\' 6"'$  (167.6  cm)  BEHAVIORAL SYMPTOMS/MOOD NEUROLOGICAL BOWEL NUTRITION STATUS      Incontinent Diet  AMBULATORY STATUS COMMUNICATION OF NEEDS Skin   Limited Assist Verbally Surgical wounds                       Personal Care Assistance Level of Assistance  Bathing, Feeding, Dressing Bathing Assistance: Limited assistance Feeding assistance: Limited assistance Dressing Assistance: Limited assistance     Functional Limitations Info  Sight, Hearing, Speech Sight Info: Adequate Hearing Info: Adequate Speech Info: Adequate    SPECIAL CARE FACTORS FREQUENCY  PT (By licensed PT), OT (By licensed OT)     PT Frequency: 5x a week OT Frequency: 5x a week            Contractures Contractures Info: Not present    Additional Factors Info  Code Status, Allergies Code Status Info: full Allergies Info: NKA           Current Medications (04/25/2021):  This is the current hospital active medication list Current Facility-Administered Medications  Medication Dose Route Frequency Provider Last Rate Last Admin   acetaminophen (TYLENOL) tablet 325-650 mg  325-650 mg Oral Q6H PRN Shawna Clamp, MD       aspirin chewable tablet 81 mg  81 mg Oral BID Magnant, Charles L, PA-C   81 mg at 04/25/21 1002   ceFAZolin (ANCEF) IVPB 1 g/50 mL premix  1 g Intravenous Q12H Magnant, Charles L, PA-C 100 mL/hr at 04/25/21 1423 1 g at 04/25/21 1423   Chlorhexidine Gluconate Cloth 2 % PADS  6 each  6 each Topical Daily Shawna Clamp, MD   6 each at 04/25/21 1002   feeding supplement (ENSURE ENLIVE / ENSURE PLUS) liquid 237 mL  237 mL Oral TID BM Lavina Hamman, MD   237 mL at [id number]   folic acid (FOLVITE) tablet 1 mg  1 mg Oral Daily Johnson, Clanford L, MD   1 mg at 04/25/21 1002   hydrALAZINE (APRESOLINE) injection 10 mg  10 mg Intravenous Q4H PRN Wynetta Emery, Clanford L, MD   10 mg at 04/21/21 1127   HYDROcodone-acetaminophen (NORCO/VICODIN) 5-325 MG per tablet 1-2 tablet  1-2 tablet Oral Q4H PRN Magnant,  Charles L, PA-C   1 tablet at 04/24/21 0404   levothyroxine (SYNTHROID) tablet 50 mcg  50 mcg Oral QAC breakfast Wynetta Emery, Clanford L, MD   50 mcg at 04/25/21 [id number]   menthol-cetylpyridinium (CEPACOL) lozenge 3 mg  1 lozenge Oral PRN Magnant, Charles L, PA-C       Or   phenol (CHLORASEPTIC) mouth spray 1 spray  1 spray Mouth/Throat PRN Magnant, Charles L, PA-C       methocarbamol (ROBAXIN) tablet 500 mg  500 mg Oral Q6H PRN Magnant, Charles L, PA-C   500 mg at 04/24/21 0405   Or   methocarbamol (ROBAXIN) 500 mg in dextrose 5 % 50 mL IVPB  500 mg Intravenous Q6H PRN Magnant, Charles L, PA-C       metoprolol tartrate (LOPRESSOR) tablet 50 mg  50 mg Oral BID Johnson, Clanford L, MD   50 mg at 04/25/21 1002   morphine 2 MG/ML injection 0.5-1 mg  0.5-1 mg Intravenous Q2H PRN Magnant, Charles L, PA-C       multivitamin with minerals tablet 1 tablet  1 tablet Oral Daily Johnson, Clanford L, MD   1 tablet at 04/25/21 1002   nitrofurantoin (macrocrystal-monohydrate) (MACROBID) capsule 100 mg  100 mg Oral Q12H Magnant, Charles L, PA-C   100 mg at 04/25/21 1419   ondansetron (ZOFRAN) tablet 4 mg  4 mg Oral Q6H PRN Magnant, Charles L, PA-C       Or   ondansetron (ZOFRAN) injection 4 mg  4 mg Intravenous Q6H PRN Magnant, Charles L, PA-C       polyethylene glycol (MIRALAX / GLYCOLAX) packet 17 g  17 g Oral Daily Lavina Hamman, MD   17 g at 04/25/21 1002   rosuvastatin (CRESTOR) tablet 5 mg  5 mg Oral QPM Johnson, Clanford L, MD   5 mg at 04/24/21 1713   senna-docusate (Senokot-S) tablet 1 tablet  1 tablet Oral BID Lavina Hamman, MD   1 tablet at 04/25/21 1002   thiamine tablet 100 mg  100 mg Oral Daily Johnson, Clanford L, MD   100 mg at 04/25/21 1002   Or   thiamine (B-1) injection 100 mg  100 mg Intravenous Daily Johnson, Clanford L, MD       Vitamin D (Ergocalciferol) (DRISDOL) capsule 50,000 Units  50,000 Units Oral Q7 days Murlean Iba, MD   50,000 Units at 04/22/21 1708     Discharge  Medications: Please see discharge summary for a list of discharge medications.  Relevant Imaging Results:  Relevant Lab Results:   Additional Information [id number], covid vaccinated no booster  Emeterio Reeve, LCSW

## 2021-04-25 NOTE — Progress Notes (Signed)
[facility] Progress Note  Patient: Judy Davis    [id number]  DOA: 04/21/2021     Date of Service: the patient was seen and examined on 04/25/2021  Brief hospital course: This 65 year old female with PMH significant for chronic alcoholism(last drink was 2 weeks ago), vitamin D deficiency , essential hypertension, chronic hyponatremia, hyperlipidemia, gait instability and moderate to severe malnutrition reportedly fell in her kitchen yesterday, tripping over her feet.  She lives with her husband.  She was unable to get up after the fall.  She denies any head injury.  She reports her family put her on couch where she remained for 24 hours,  she continued to complain of left hip pain and thigh pain.  x-ray shows left hip with left femoral neck fracture.  Patient was transferred from [facility] for possible open reduction and internal fixation.  Currently plan is monitor for postop recovery.  Subjective: Pain remains controlled.  No nausea no vomiting.  No fever no chills.  Passing gas.  Assessment and Plan: Left femoral neck fracture: Patient presented s/p fall with left femoral neck fracture. Adequate pain control Orthopedic consulted, Underwent total left hip hemiarthroplasty. Follow-up postoperative course, PT OT evaluation.  Postoperative acute blood loss anemia. Based on hemoglobin around 10. Postoperatively hemoglobin around 9.  Down to around 8. Will monitor H&H. Transfuse for hemoglobin less than 8.   Hyponatremia: Patient does have chronic hyponatremia.   Baseline sodium runs between 127-130. Treated with IV hydration.  Will discontinue.  Add protein supplements.   GERD :  continue Protonix   Severe vitamin D deficiency: Patient is not taking any vitamin D supplements. Oral vitamin D replacement.   Hypothyroidism : Continue Levothyroxine   Hyperlipidemia :  continue rosuvastatin   Essential hypertension :  resume home blood pressure medications    History of alcohol abuse: Patient reported lasting was 2 weeks ago.  Continue CIWA protocol.   History of chronic pancreatitis :  continue Creon.  Scheduled Meds:  aspirin  81 mg Oral BID   Chlorhexidine Gluconate Cloth  6 each Topical Daily   feeding supplement  237 mL Oral TID BM   folic acid  1 mg Oral Daily   levothyroxine  50 mcg Oral QAC breakfast   metoprolol tartrate  50 mg Oral BID   multivitamin with minerals  1 tablet Oral Daily   nitrofurantoin (macrocrystal-monohydrate)  100 mg Oral Q12H   polyethylene glycol  17 g Oral Daily   rosuvastatin  5 mg Oral QPM   senna-docusate  1 tablet Oral BID   thiamine  100 mg Oral Daily   Or   thiamine  100 mg Intravenous Daily   Vitamin D (Ergocalciferol)  50,000 Units Oral Q7 days   Continuous Infusions:   ceFAZolin (ANCEF) IV 1 g (04/25/21 1423)   methocarbamol (ROBAXIN) IV     PRN Meds: acetaminophen, hydrALAZINE, HYDROcodone-acetaminophen, menthol-cetylpyridinium **OR** phenol, methocarbamol **OR** methocarbamol (ROBAXIN) IV, morphine injection, ondansetron **OR** ondansetron (ZOFRAN) IV  Body mass index is 21.47 kg/m.  Nutrition Problem: Increased nutrient needs Etiology: hip fracture, post-op healing     DVT Prophylaxis:   SCDs Start: 04/23/21 2025 Place TED hose Start: 04/23/21 2025    Advance goals of care discussion: Pt is Full code.  Family Communication: no family was present at bedside, at the time of interview.   Data Reviewed: I have personally reviewed and interpreted daily labs, tele strips, imaging. Sodium 127.  Hemoglobin 11.01-10.5-8.9-7.9.  Physical Exam:  General:  Appear in mild distress, no Rash; Oral Mucosa Clear, moist. no Abnormal Neck Mass Or lumps, Conjunctiva normal  Cardiovascular: S1 and S2 Present, no Murmur, Respiratory: good respiratory effort, Bilateral Air entry present and CTA, no Crackles, no wheezes Abdomen: Bowel Sound present, Soft and no tenderness Extremities: no Pedal  edema Neurology: alert and oriented to time, place, and person affect appropriate. no new focal deficit Gait not checked due to patient safety concerns  Vitals:   04/24/21 2356 04/25/21 0630 04/25/21 0933 04/25/21 1501  BP: 133/65 (!) 125/59 136/74 129/67  Pulse: 85 97 (!) 108 86  Resp: '14 15 16 17  '$ Temp: 99.1 F (37.3 C) 99.3 F (37.4 C) 99 F (37.2 C) 98.3 F (36.8 C)  TempSrc: Oral Oral Oral Oral  SpO2: 100% 100% 100% 100%  Weight:      Height:        Disposition:  Status is: Inpatient  Remains inpatient appropriate because:Ongoing active pain requiring inpatient pain management and Unsafe d/c plan  Dispo: The patient is from: Home              Anticipated d/c is to: SNF              Patient currently is not medically stable to d/c.   Difficult to place patient No        Time spent: 35 minutes. I reviewed all nursing notes, pharmacy notes, vitals, pertinent old records. I have discussed plan of care as described above with RN.  Author: Berle Mull, MD [facility] 04/25/2021 5:37 PM  To reach On-call, see care teams to locate the attending and reach out via [email or web address]. Between 7PM-7AM, please contact night-coverage If you still have difficulty reaching the attending provider, please page the [facility] (Director on Call) for [facility] on amion for assistance.

## 2021-04-25 NOTE — TOC Initial Note (Signed)
Transition of Care [facility]) - Initial/Assessment Note    Patient Details  Name: Judy Davis MRN: [id number] Date of Birth: March 29, 1956  Transition of Care [facility]) CM/SW Contact:    Emeterio Reeve, LCSW Phone Number: 04/25/2021, 2:40 PM  Clinical Narrative:                  CSW received SNF consult. CSW met with pt at bedside. CSW introduced self and explained role at the hospital. Pt reports that PTA She lived at home with her husband. Pt reports she is independent with walking at ADL's.  CSW reviewed PT/OT recommendations for SNF. Pt states she doesn't know what CSW is talking about and gave CSW permission to call her husband.  Pts husband gave CSW permission to fax out to facilities in the area. Pt has no preference of facility at this time but wants to stay in [place] area. CSW gave pt medicare.gov rating list to review. CSW explained insurance auth process. Pt reports they are covid vaccinated.  CSW will continue to follow.   Expected Discharge Plan: Skilled Nursing Facility Barriers to Discharge: Continued Medical Work up   Patient Goals and CMS Choice   CMS Medicare.gov Compare Post Acute Care list provided to:: Patient Choice offered to / list presented to : Patient  Expected Discharge Plan and Services Expected Discharge Plan: [place] arrangements for the past 2 months: Single Family Home                                      Prior Living Arrangements/Services Living arrangements for the past 2 months: Single Family Home Lives with:: Spouse Patient language and need for interpreter reviewed:: Yes Do you feel safe going back to the place where you live?: Yes      Need for Family Participation in Patient Care: Yes (Comment) Care giver support system in place?: Yes (comment)   Criminal Activity/Legal Involvement Pertinent to Current Situation/Hospitalization: No - Comment as needed  Activities of Daily Living       Permission Sought/Granted Permission sought to share information with : Facility Art therapist granted to share information with : Yes, Verbal Permission Granted     Permission granted to share info w AGENCY: SNF  Permission granted to share info w Relationship: Husband     Emotional Assessment Appearance:: Appears stated age Attitude/Demeanor/Rapport: Engaged Affect (typically observed): Appropriate Orientation: : Oriented to Self, Oriented to Place Alcohol / Substance Use: Not Applicable Psych Involvement: No (comment)  Admission diagnosis:  Fall [W19.XXXA] Closed left hip fracture ([place]) [S72.002A] Closed fracture of left hip, initial encounter ([place]) [S72.002A] Hip fx, left, closed, initial encounter [facility]) [S72.002A] Patient Active Problem List   Diagnosis Date Noted   Closed left hip fracture ([place]) 04/21/2021   Hypothyroidism    Hyperlipidemia    Chronic pancreatitis ([place]) 12/10/2020   Encounter for screening colonoscopy 12/10/2020   Elevated CA 19-9 level 07/18/2020   Acute kidney injury ([place])    Severe Vitamin D deficiency 02/28/2020   Malnutrition of moderate degree 02/28/2020   Generalized abdominal pain 02/27/2020   GERD (gastroesophageal reflux disease) 02/27/2020   Altered mental state 02/27/2020   Sinus tachycardia 02/27/2020   Acute on chronic pancreatitis ([place]) 02/27/2020   Pancreatic mass 02/27/2020   Pancreatic lesion 02/27/2020   Gait instability 08/10/2019   Alcohol abuse 08/08/2019  Intractable nausea and vomiting 08/08/2019   Intractable vomiting 08/07/2019   Hematoma 09/29/2016   Elevated AST (SGOT) 09/29/2016   Hyperglycemia 09/29/2016   Hypokalemia 09/29/2016   Upper respiratory infection 07/06/2011   Lower urinary tract infectious disease 07/06/2011   Nausea and vomiting 07/05/2011   Hyponatremia 07/05/2011   HTN (hypertension) 07/05/2011   Anemia 07/05/2011   PCP:  [facility], [place]:    [place], [place] - [address] AT [address] [place] [address] Phone: [phone] Fax: [phone]     Social Determinants of Health (SDOH) Interventions    Readmission Risk Interventions No flowsheet data found.  Emeterio Reeve, Latanya Presser, [place] Social Worker [phone]

## 2021-04-26 DIAGNOSIS — S72002D Fracture of unspecified part of neck of left femur, subsequent encounter for closed fracture with routine healing: Secondary | ICD-10-CM | POA: Diagnosis not present

## 2021-04-26 LAB — CBC
HCT: 23.4 % — ABNORMAL LOW (ref 36.0–46.0)
Hemoglobin: 7.5 g/dL — ABNORMAL LOW (ref 12.0–15.0)
MCH: 30.7 pg (ref 26.0–34.0)
MCHC: 32.1 g/dL (ref 30.0–36.0)
MCV: 95.9 fL (ref 80.0–100.0)
Platelets: 339 10*3/uL (ref 150–400)
RBC: 2.44 MIL/uL — ABNORMAL LOW (ref 3.87–5.11)
RDW: 12.8 % (ref 11.5–15.5)
WBC: 11.2 10*3/uL — ABNORMAL HIGH (ref 4.0–10.5)
nRBC: 0 % (ref 0.0–0.2)

## 2021-04-26 LAB — CBC WITH DIFFERENTIAL/PLATELET
Abs Immature Granulocytes: 0.09 10*3/uL — ABNORMAL HIGH (ref 0.00–0.07)
Basophils Absolute: 0 10*3/uL (ref 0.0–0.1)
Basophils Relative: 0 %
Eosinophils Absolute: 0.1 10*3/uL (ref 0.0–0.5)
Eosinophils Relative: 1 %
HCT: 23.5 % — ABNORMAL LOW (ref 36.0–46.0)
Hemoglobin: 7.7 g/dL — ABNORMAL LOW (ref 12.0–15.0)
Immature Granulocytes: 1 %
Lymphocytes Relative: 15 %
Lymphs Abs: 1.8 10*3/uL (ref 0.7–4.0)
MCH: 30.9 pg (ref 26.0–34.0)
MCHC: 32.8 g/dL (ref 30.0–36.0)
MCV: 94.4 fL (ref 80.0–100.0)
Monocytes Absolute: 1.7 10*3/uL — ABNORMAL HIGH (ref 0.1–1.0)
Monocytes Relative: 15 %
Neutro Abs: 8 10*3/uL — ABNORMAL HIGH (ref 1.7–7.7)
Neutrophils Relative %: 68 %
Platelets: 310 10*3/uL (ref 150–400)
RBC: 2.49 MIL/uL — ABNORMAL LOW (ref 3.87–5.11)
RDW: 12.6 % (ref 11.5–15.5)
WBC: 11.6 10*3/uL — ABNORMAL HIGH (ref 4.0–10.5)
nRBC: 0 % (ref 0.0–0.2)

## 2021-04-26 LAB — URINE CULTURE: Culture: >=100000 — AB

## 2021-04-26 LAB — BASIC METABOLIC PANEL
Anion gap: 7 (ref 5–15)
Anion gap: 7 (ref 5–15)
BUN: 9 mg/dL (ref 8–23)
BUN: 18 mg/dL (ref 8–23)
CO2: 28 mmol/L (ref 22–32)
CO2: 27 mmol/L (ref 22–32)
Calcium: 8.9 mg/dL (ref 8.9–10.3)
Calcium: 8.7 mg/dL — ABNORMAL LOW (ref 8.9–10.3)
Chloride: 91 mmol/L — ABNORMAL LOW (ref 98–111)
Chloride: 90 mmol/L — ABNORMAL LOW (ref 98–111)
Creatinine, Ser: 0.63 mg/dL (ref 0.44–1.00)
Creatinine, Ser: 0.66 mg/dL (ref 0.44–1.00)
GFR, Estimated: >60 mL/min (ref >60)
GFR, Estimated: >60 mL/min (ref >60)
Glucose, Bld: 148 mg/dL — ABNORMAL HIGH (ref 70–99)
Glucose, Bld: 139 mg/dL — ABNORMAL HIGH (ref 70–99)
Potassium: 3.2 mmol/L — ABNORMAL LOW (ref 3.5–5.1)
Potassium: 4.3 mmol/L (ref 3.5–5.1)
Sodium: 126 mmol/L — ABNORMAL LOW (ref 135–145)
Sodium: 124 mmol/L — ABNORMAL LOW (ref 135–145)

## 2021-04-26 LAB — MAGNESIUM: Magnesium: 1.7 mg/dL (ref 1.7–2.4)

## 2021-04-26 MED ORDER — POTASSIUM CHLORIDE CRYS ER 20 MEQ PO TBCR
40.0000 meq | EXTENDED_RELEASE_TABLET | ORAL | Status: AC
  Administered 2021-04-26 (×2): 40 meq via ORAL
  Filled 2021-04-26: qty 2

## 2021-04-26 MED ORDER — UREA 15 G PO PACK
15.0000 g | PACK | Freq: Two times a day (BID) | ORAL | Status: DC
  Administered 2021-04-26 – 2021-04-29 (×7): 15 g via ORAL
  Filled 2021-04-26 (×8): qty 1

## 2021-04-26 MED ORDER — AMOXICILLIN 500 MG PO CAPS
500.0000 mg | ORAL_CAPSULE | Freq: Three times a day (TID) | ORAL | Status: DC
  Administered 2021-04-26 – 2021-04-29 (×11): 500 mg via ORAL
  Filled 2021-04-26 (×13): qty 1

## 2021-04-26 NOTE — TOC Progression Note (Addendum)
Transition of Care [facility]) - Progression Note    Patient Details  Name: Judy Davis MRN: [id number] Date of Birth: Jun 02, 1956  Transition of Care [facility]) CM/SW Contact  Emeterio Reeve, [place] Phone Number: 04/26/2021, 12:36 PM  Clinical Narrative:     CSW gave bed offers to husband over the phone. Husband stated he want [facility] since they live in [place]. CSW confirmed that [facility] can accept pt. [facility] will have a bed available on Sunday. CSW will inform MD. CSW started insurance auth, reference number [id number]. Pt will need a covid test on 8/13.   Expected Discharge Plan: [place] Barriers to Discharge: Continued Medical Work up  Expected Discharge Plan and Services Expected Discharge Plan: [place] arrangements for the past 2 months: Single Family Home                                       Social Determinants of Health (SDOH) Interventions    Readmission Risk Interventions No flowsheet data found.  Emeterio Reeve, Latanya Presser, [place] Social Worker [phone]

## 2021-04-26 NOTE — Progress Notes (Signed)
[facility] Progress Note  Patient: Judy Davis    [id number]  DOA: 04/21/2021     Date of Service: the patient was seen and examined on 04/26/2021  Brief hospital course: This 65 year old female with PMH significant for chronic alcoholism(last drink was 2 weeks ago), vitamin D deficiency , essential hypertension, chronic hyponatremia, hyperlipidemia, gait instability and moderate to severe malnutrition reportedly fell in her kitchen yesterday, tripping over her feet.  She lives with her husband.  She was unable to get up after the fall.  She denies any head injury.  She reports her family put her on couch where she remained for 24 hours,  she continued to complain of left hip pain and thigh pain.  x-ray shows left hip with left femoral neck fracture.  Patient was transferred from [facility] for possible open reduction and internal fixation.  Currently plan is monitor for postop recovery.  Subjective: Reports severe pain.  Has not called the nurse for pain medication.  No nausea no vomiting.  Minimal oral intake.  No appetite.  Assessment and Plan: Left femoral neck fracture: Patient presented s/p fall with left femoral neck fracture. Adequate pain control Orthopedic consulted, Underwent total left hip hemiarthroplasty. Follow-up postoperative course, PT OT evaluation.  Postoperative acute blood loss anemia. Based on hemoglobin around 10. Postoperatively hemoglobin around 9.  Down to around 8. Will monitor H&H.  Now remaining stable. Transfuse for hemoglobin less than 7.   Hyponatremia: Patient does have chronic hyponatremia.   Baseline sodium runs between 127-130. Treated with IV hydration.  Will discontinue.  Add protein supplements.   GERD :   continue Protonix   Severe vitamin D deficiency: Patient is not taking any vitamin D supplements. Oral vitamin D replacement.   Hypothyroidism :  Continue Levothyroxine   Hyperlipidemia :   continue  rosuvastatin   Essential hypertension :   resume home blood pressure medications   History of alcohol abuse: Patient reported lasting was 2 weeks ago.  Continue CIWA protocol.   History of chronic pancreatitis :  continue Creon.  Hypokalemia. Replaced.   Scheduled Meds:  amoxicillin  500 mg Oral Q8H   aspirin  81 mg Oral BID   Chlorhexidine Gluconate Cloth  6 each Topical Daily   feeding supplement  237 mL Oral TID BM   folic acid  1 mg Oral Daily   levothyroxine  50 mcg Oral QAC breakfast   metoprolol tartrate  50 mg Oral BID   multivitamin with minerals  1 tablet Oral Daily   polyethylene glycol  17 g Oral Daily   rosuvastatin  5 mg Oral QPM   senna-docusate  1 tablet Oral BID   thiamine  100 mg Oral Daily   Or   thiamine  100 mg Intravenous Daily   urea  15 g Oral BID   Vitamin D (Ergocalciferol)  50,000 Units Oral Q7 days   Continuous Infusions:  methocarbamol (ROBAXIN) IV     PRN Meds: acetaminophen, HYDROcodone-acetaminophen, menthol-cetylpyridinium **OR** phenol, methocarbamol **OR** methocarbamol (ROBAXIN) IV, morphine injection, ondansetron **OR** ondansetron (ZOFRAN) IV  Body mass index is 21.47 kg/m.  Nutrition Problem: Increased nutrient needs Etiology: hip fracture, post-op healing     DVT Prophylaxis:   SCDs Start: 04/23/21 2025 Place TED hose Start: 04/23/21 2025    Advance goals of care discussion: Pt is Full code.  Family Communication: no family was present at bedside, at the time of interview.   Data Reviewed: I have personally reviewed and  interpreted daily labs, tele strips, imaging. Sodium 126.  Potassium 3.2.  Hemoglobin 7.7.  Physical Exam:  General: Appear in mild distress, no Rash; Oral Mucosa Clear, moist. no Abnormal Neck Mass Or lumps, Conjunctiva normal  Cardiovascular: S1 and S2 Present, no Murmur, Respiratory: good respiratory effort, Bilateral Air entry present and CTA, no Crackles, no wheezes Abdomen: Bowel Sound present,  Soft and no tenderness Extremities: no Pedal edema Neurology: alert and oriented to time, place, and person affect appropriate. no new focal deficit Gait not checked due to patient safety concerns   Vitals:   04/26/21 0448 04/26/21 0913 04/26/21 1313 04/26/21 1726  BP: 116/70 130/70 128/83 131/72  Pulse: 79 95 87 80  Resp: '16 16 16 14  '$ Temp: 99.3 F (37.4 C) 98.6 F (37 C) 98.5 F (36.9 C) 98.5 F (36.9 C)  TempSrc: Oral Oral Oral Oral  SpO2: 100% 100% 100% 100%  Weight:      Height:        Disposition:  Status is: Inpatient  Remains inpatient appropriate because:Ongoing active pain requiring inpatient pain management and Unsafe d/c plan  Dispo: The patient is from: Home              Anticipated d/c is to: SNF              Patient currently is not medically stable to d/c.   Difficult to place patient No        Time spent: 35 minutes. I reviewed all nursing notes, pharmacy notes, vitals, pertinent old records. I have discussed plan of care as described above with RN.  Author: Berle Mull, MD [facility] 04/26/2021 6:31 PM  To reach On-call, see care teams to locate the attending and reach out via [email or web address]. Between 7PM-7AM, please contact night-coverage If you still have difficulty reaching the attending provider, please page the [facility] (Director on Call) for [facility] on amion for assistance.

## 2021-04-26 NOTE — Progress Notes (Signed)
Physical Therapy Treatment Patient Details Name: Judy Davis MRN: [id number] DOB: 02/14/1956 Today's Date: 04/26/2021    History of Present Illness 65 yo female with onset of L femoral neck fracture from a trip at home now has L THA, direct anterior approach with WBAT.  Home with husband, has had issues of EtOH.  PMHx:  Vit D deficiency, EtOH use, HTN, hyponatremia, HLD, malnutrition, hypothyroidism,    PT Comments    Pt supine in bed on arrival this session.  Pt required encouragement to participate in PT session.  She remains to present with poor balance and weakness but able to tolerate increased activity.  Continue to recommend snf placement at d/c.      Follow Up Recommendations  SNF     Equipment Recommendations  None recommended by PT    Recommendations for Other Services       Precautions / Restrictions Precautions Precautions: Fall Precaution Comments: confusion Restrictions Weight Bearing Restrictions: Yes LLE Weight Bearing: Weight bearing as tolerated    Mobility  Bed Mobility Overal bed mobility: Needs Assistance Bed Mobility: Supine to Sit;Sit to Supine     Supine to sit: Min assist Sit to supine: Min assist   General bed mobility comments: Min assistance to advance LLE to edge of bed.  Pt required increased time and effort this session.  Min assistance for back to bed.    Transfers Overall transfer level: Needs assistance Equipment used: Rolling walker (2 wheeled) Transfers: Sit to/from Stand Sit to Stand: Min assist;Mod assist         General transfer comment: Cues for hand placement to rise into standing. min from elevated bed and mod assistance from commode.  Ambulation/Gait Ambulation/Gait assistance: Mod assist;Min assist Gait Distance (Feet): 10 Feet (+ 25 ft.  seated rest break between trials.) Assistive device: Rolling walker (2 wheeled) Gait Pattern/deviations: Step-to pattern;Decreased stride length;Decreased stance time -  left;Decreased weight shift to left Gait velocity: reduced   General Gait Details: Cues for sequencing RW safety and posture.  Pt able to progress gt distance this session.  Demonstrates unsteady gt and ambulated too close to device.   Stairs             Wheelchair Mobility    Modified Rankin (Stroke Patients Only)       Balance Overall balance assessment: History of Falls;Needs assistance   Sitting balance-Leahy Scale: Good       Standing balance-Leahy Scale: Poor                              Cognition Arousal/Alertness: Awake/alert Behavior During Therapy: Impulsive;WFL for tasks assessed/performed Overall Cognitive Status: No family/caregiver present to determine baseline cognitive functioning                                 General Comments: Required repeated cueing during session.      Exercises General Exercises - Lower Extremity Ankle Circles/Pumps: AROM;Both;20 reps;Supine Quad Sets: AROM;Left;10 reps;Supine Long Arc Quad: AROM;Left;10 reps;Seated Heel Slides: Left;10 reps;Supine;AAROM Hip ABduction/ADduction: AAROM;Left;10 reps;Supine    General Comments        Pertinent Vitals/Pain Pain Assessment: Faces Faces Pain Scale: Hurts even more Pain Location: L hip Pain Descriptors / Indicators: Operative site guarding Pain Intervention(s): Monitored during session;Repositioned    Home Living  Prior Function            PT Goals (current goals can now be found in the care plan section) Acute Rehab PT Goals Potential to Achieve Goals: Good Progress towards PT goals: Progressing toward goals    Frequency    7X/week      PT Plan Current plan remains appropriate    Co-evaluation              AM-PAC PT "6 Clicks" Mobility   Outcome Measure  Help needed turning from your back to your side while in a flat bed without using bedrails?: A Little Help needed moving from lying on  your back to sitting on the side of a flat bed without using bedrails?: A Little Help needed moving to and from a bed to a chair (including a wheelchair)?: A Little Help needed standing up from a chair using your arms (e.g., wheelchair or bedside chair)?: A Little Help needed to walk in hospital room?: A Lot Help needed climbing 3-5 steps with a railing? : A Lot 6 Click Score: 16    End of Session Equipment Utilized During Treatment: Gait belt Activity Tolerance: Patient limited by fatigue;Patient limited by pain;Treatment limited secondary to medical complications (Comment) Patient left: with call bell/phone within reach;in bed;with bed alarm set (in chair position.) Nurse Communication: Mobility status PT Visit Diagnosis: Unsteadiness on feet (R26.81);Muscle weakness (generalized) (M62.81);Difficulty in walking, not elsewhere classified (R26.2)     Time: 1610-9604 PT Time Calculation (min) (ACUTE ONLY): 36 min  Charges:  $Gait Training: 8-22 mins $Therapeutic Exercise: 8-22 mins                     Bonney Leitz , PTA Acute Rehabilitation Services Pager [phone] Office [phone]    Judy Davis Artis Delay 04/26/2021, 5:08 PM

## 2021-04-26 NOTE — Progress Notes (Signed)
Patient examined and she is stable from orthopedic standpoint.  Okay for discharge from Ortho standpoint.  She reports that she is doing well and her hip pain is well controlled.  Mostly reporting incisional pain with no groin pain or radicular pain.  Dressing looks intact without gross blood or drainage.  No calf tenderness on exam.  No pain with logroll of left lower extremity.  Urine culture has returned with sensitivities and antibiotics were adjusted; appreciate hospitalist team input.  Placed written pain medication prescriptions on patient's chart for eventual discharge to skilled nursing facility.  Plan for follow-up with Dr. Marlou Sa in clinic 2 weeks after procedure.

## 2021-04-27 DIAGNOSIS — S72002D Fracture of unspecified part of neck of left femur, subsequent encounter for closed fracture with routine healing: Secondary | ICD-10-CM | POA: Diagnosis not present

## 2021-04-27 LAB — CBC WITH DIFFERENTIAL/PLATELET
Abs Immature Granulocytes: 0.07 10*3/uL (ref 0.00–0.07)
Basophils Absolute: 0.1 10*3/uL (ref 0.0–0.1)
Basophils Relative: 1 %
Eosinophils Absolute: 0.2 10*3/uL (ref 0.0–0.5)
Eosinophils Relative: 2 %
HCT: 22.9 % — ABNORMAL LOW (ref 36.0–46.0)
Hemoglobin: 7.7 g/dL — ABNORMAL LOW (ref 12.0–15.0)
Immature Granulocytes: 1 %
Lymphocytes Relative: 22 %
Lymphs Abs: 2.3 10*3/uL (ref 0.7–4.0)
MCH: 31.8 pg (ref 26.0–34.0)
MCHC: 33.6 g/dL (ref 30.0–36.0)
MCV: 94.6 fL (ref 80.0–100.0)
Monocytes Absolute: 1.2 10*3/uL — ABNORMAL HIGH (ref 0.1–1.0)
Monocytes Relative: 12 %
Neutro Abs: 6.5 10*3/uL (ref 1.7–7.7)
Neutrophils Relative %: 62 %
Platelets: 370 10*3/uL (ref 150–400)
RBC: 2.42 MIL/uL — ABNORMAL LOW (ref 3.87–5.11)
RDW: 12.6 % (ref 11.5–15.5)
WBC: 10.3 10*3/uL (ref 4.0–10.5)
nRBC: 0 % (ref 0.0–0.2)

## 2021-04-27 LAB — BASIC METABOLIC PANEL
Anion gap: 7 (ref 5–15)
BUN: 27 mg/dL — ABNORMAL HIGH (ref 8–23)
CO2: 30 mmol/L (ref 22–32)
Calcium: 9.4 mg/dL (ref 8.9–10.3)
Chloride: 91 mmol/L — ABNORMAL LOW (ref 98–111)
Creatinine, Ser: 0.6 mg/dL (ref 0.44–1.00)
GFR, Estimated: >60 mL/min (ref >60)
Glucose, Bld: 119 mg/dL — ABNORMAL HIGH (ref 70–99)
Potassium: 4.4 mmol/L (ref 3.5–5.1)
Sodium: 128 mmol/L — ABNORMAL LOW (ref 135–145)

## 2021-04-27 NOTE — Progress Notes (Signed)
[facility] Progress Note  Patient: Judy Davis    [id number]  DOA: 04/21/2021     Date of Service: the patient was seen and examined on 04/27/2021  Brief hospital course: This 65 year old female with PMH significant for chronic alcoholism(last drink was 2 weeks ago), vitamin D deficiency , essential hypertension, chronic hyponatremia, hyperlipidemia, gait instability and moderate to severe malnutrition reportedly fell in her kitchen yesterday, tripping over her feet.  She lives with her husband.  She was unable to get up after the fall.  She denies any head injury.  She reports her family put her on couch where she remained for 24 hours,  she continued to complain of left hip pain and thigh pain.  x-ray shows left hip with left femoral neck fracture.  Patient was transferred from [facility] for possible open reduction and internal fixation.  Currently plan is arranging for safe discharge  Subjective: Pain well controlled.  No nausea no vomiting.  No fever no chills.  Assessment and Plan: Left femoral neck fracture: Patient presented s/p fall with left femoral neck fracture. Adequate pain control Orthopedic consulted, Underwent total left hip hemiarthroplasty. Follow-up postoperative course, PT OT evaluation recommends SNF. Likely will go to SNF tomorrow.  Postoperative acute blood loss anemia. Based on hemoglobin around 10. Currently hemoglobin around 7.  Stable.  No active bleeding.  No evidence of hematoma.   Hyponatremia: Patient does have chronic hyponatremia.   Baseline sodium runs between 127-130. Treated with IV hydration.  Will discontinue.  Add protein supplements.   GERD :   continue Protonix   Severe vitamin D deficiency: Patient is not taking any vitamin D supplements. Oral vitamin D replacement.   Hypothyroidism :  Continue Levothyroxine   Hyperlipidemia :   continue rosuvastatin   Essential hypertension :   resume home blood pressure  medications   History of alcohol abuse: Patient reported lasting was 2 weeks ago.  Continue CIWA protocol.   History of chronic pancreatitis :  continue Creon.  Hypokalemia. Replaced.   Scheduled Meds:  amoxicillin  500 mg Oral Q8H   aspirin  81 mg Oral BID   Chlorhexidine Gluconate Cloth  6 each Topical Daily   feeding supplement  237 mL Oral TID BM   folic acid  1 mg Oral Daily   levothyroxine  50 mcg Oral QAC breakfast   metoprolol tartrate  50 mg Oral BID   multivitamin with minerals  1 tablet Oral Daily   polyethylene glycol  17 g Oral Daily   rosuvastatin  5 mg Oral QPM   senna-docusate  1 tablet Oral BID   thiamine  100 mg Oral Daily   Or   thiamine  100 mg Intravenous Daily   urea  15 g Oral BID   Vitamin D (Ergocalciferol)  50,000 Units Oral Q7 days   Continuous Infusions:  methocarbamol (ROBAXIN) IV     PRN Meds: acetaminophen, HYDROcodone-acetaminophen, menthol-cetylpyridinium **OR** phenol, methocarbamol **OR** methocarbamol (ROBAXIN) IV, morphine injection, ondansetron **OR** ondansetron (ZOFRAN) IV  Body mass index is 21.47 kg/m.  Nutrition Problem: Increased nutrient needs Etiology: hip fracture, post-op healing     DVT Prophylaxis:   SCDs Start: 04/23/21 2025 Place TED hose Start: 04/23/21 2025    Advance goals of care discussion: Pt is Full code.  Family Communication: no family was present at bedside, at the time of interview.   Data Reviewed: I have personally reviewed and interpreted daily labs, tele strips, imaging. Sodium 128.  Serum  creatinine stable.  Hemoglobin 7.7.  Physical Exam:  General: Appear in mild distress, no Rash; Oral Mucosa Clear, moist. no Abnormal Neck Mass Or lumps, Conjunctiva normal  Cardiovascular: S1 and S2 Present, no Murmur, Respiratory: good respiratory effort, Bilateral Air entry present and CTA, no Crackles, no wheezes Abdomen: Bowel Sound present, Soft and no tenderness Extremities: no Pedal  edema Neurology: alert and oriented to time, place, and person affect appropriate. no new focal deficit Gait not checked due to patient safety concerns   Vitals:   04/27/21 0435 04/27/21 0754 04/27/21 1138 04/27/21 1549  BP: 105/62 114/73 137/73 138/66  Pulse: 75 70 70 77  Resp: '17 16 16 19  '$ Temp: 98.5 F (36.9 C) 98.4 F (36.9 C) 97.9 F (36.6 C) 98.4 F (36.9 C)  TempSrc: Oral Oral Oral Oral  SpO2: 99% 100% 100% 100%  Weight:      Height:        Disposition:  Status is: Inpatient  Remains inpatient appropriate because:Ongoing active pain requiring inpatient pain management and Unsafe d/c plan  Dispo: The patient is from: Home              Anticipated d/c is to: SNF              Patient currently is not medically stable to d/c.   Difficult to place patient No        Time spent: 35 minutes. I reviewed all nursing notes, pharmacy notes, vitals, pertinent old records. I have discussed plan of care as described above with RN.  Author: Berle Mull, MD [facility] 04/27/2021 7:00 PM  To reach On-call, see care teams to locate the attending and reach out via [email or web address]. Between 7PM-7AM, please contact night-coverage If you still have difficulty reaching the attending provider, please page the [facility] (Director on Call) for [facility] on amion for assistance.

## 2021-04-27 NOTE — Plan of Care (Signed)
  Problem: Nutrition: Goal: Adequate nutrition will be maintained Outcome: Progressing   

## 2021-04-27 NOTE — Progress Notes (Signed)
Physical Therapy Treatment Patient Details Name: Judy Davis MRN: [id number] DOB: 09/19/1955 Today's Date: 04/27/2021    History of Present Illness 65 yo female with onset of L femoral neck fracture from a trip at home now has L THA, direct anterior approach with WBAT.  Home with husband, has had issues of EtOH.  PMHx:  Vit D deficiency, EtOH use, HTN, hyponatremia, HLD, malnutrition, hypothyroidism,    PT Comments    Pt supine in bed on arrival this session.  Pt required cues for sequencing and safety.  She was able to increase gt distance and no LOB noted this session. Continue to recommend SNF placement at d/c.      Follow Up Recommendations  SNF     Equipment Recommendations  None recommended by PT    Recommendations for Other Services       Precautions / Restrictions Precautions Precautions: Fall Precaution Comments: confusion Restrictions Weight Bearing Restrictions: Yes LLE Weight Bearing: Weight bearing as tolerated    Mobility  Bed Mobility Overal bed mobility: Needs Assistance Bed Mobility: Supine to Sit;Sit to Supine     Supine to sit: Min assist     General bed mobility comments: Min assistance to advance LLE to edge of bed.  Pt required increased time and effort this session.    Transfers Overall transfer level: Needs assistance Equipment used: Rolling walker (2 wheeled) Transfers: Sit to/from Stand Sit to Stand: Min assist;From elevated surface         General transfer comment: Cues for hand placement to rise into standing.  Ambulation/Gait Ambulation/Gait assistance: Min assist Gait Distance (Feet): 40 Feet Assistive device: Rolling walker (2 wheeled) Gait Pattern/deviations: Step-to pattern;Decreased stride length;Decreased stance time - left;Decreased weight shift to left     General Gait Details: Cues for sequencing RW safety and posture.  Pt able to progress gt distance this session.  No LOB noted this session but cadence remains  slow.   Stairs             Wheelchair Mobility    Modified Rankin (Stroke Patients Only)       Balance Overall balance assessment: History of Falls;Needs assistance   Sitting balance-Leahy Scale: Good       Standing balance-Leahy Scale: Poor                              Cognition Arousal/Alertness: Awake/alert Behavior During Therapy: Impulsive;WFL for tasks assessed/performed Overall Cognitive Status: No family/caregiver present to determine baseline cognitive functioning                                 General Comments: Required repeated cueing during session.      Exercises General Exercises - Lower Extremity Ankle Circles/Pumps: AROM;Both;20 reps;Supine Quad Sets: AROM;Left;10 reps;Supine Heel Slides: Left;10 reps;Supine;AAROM Hip ABduction/ADduction: AAROM;Left;10 reps;Supine    General Comments        Pertinent Vitals/Pain Pain Assessment: Faces Faces Pain Scale: Hurts little more Pain Location: L hip Pain Descriptors / Indicators: Operative site guarding Pain Intervention(s): Monitored during session;Repositioned;Ice applied    Home Living                      Prior Function            PT Goals (current goals can now be found in the care plan section) Acute Rehab PT Goals  Patient Stated Goal: to get up to chair Potential to Achieve Goals: Good    Frequency    7X/week      PT Plan Current plan remains appropriate    Co-evaluation              AM-PAC PT "6 Clicks" Mobility   Outcome Measure  Help needed turning from your back to your side while in a flat bed without using bedrails?: A Little Help needed moving from lying on your back to sitting on the side of a flat bed without using bedrails?: A Little Help needed moving to and from a bed to a chair (including a wheelchair)?: A Little Help needed standing up from a chair using your arms (e.g., wheelchair or bedside chair)?: A  Little Help needed to walk in hospital room?: A Little Help needed climbing 3-5 steps with a railing? : A Little 6 Click Score: 18    End of Session Equipment Utilized During Treatment: Gait belt Activity Tolerance: Patient limited by fatigue;Patient limited by pain;Treatment limited secondary to medical complications (Comment) Patient left: with call bell/phone within reach;in chair;with chair alarm set Nurse Communication: Mobility status PT Visit Diagnosis: Unsteadiness on feet (R26.81);Muscle weakness (generalized) (M62.81);Difficulty in walking, not elsewhere classified (R26.2)     Time: 1610-9604 PT Time Calculation (min) (ACUTE ONLY): 18 min  Charges:  $Gait Training: 8-22 mins                    Bonney Leitz , PTA Acute Rehabilitation Services Pager [phone] Office [phone]    Judy Davis Artis Delay 04/27/2021, 1:12 PM

## 2021-04-28 DIAGNOSIS — S72002D Fracture of unspecified part of neck of left femur, subsequent encounter for closed fracture with routine healing: Secondary | ICD-10-CM | POA: Diagnosis not present

## 2021-04-28 LAB — SARS CORONAVIRUS 2 (TAT 6-24 HRS): SARS Coronavirus 2: NEGATIVE

## 2021-04-28 MED ORDER — POLYETHYLENE GLYCOL 3350 17 G PO PACK: 17.0000 g | PACK | Freq: Every day | ORAL | 0 refills | Status: DC

## 2021-04-28 MED ORDER — VITAMIN D (ERGOCALCIFEROL) 1.25 MG (50000 UNIT) PO CAPS: 50000.0000 [IU] | ORAL_CAPSULE | ORAL | 0 refills | Status: DC

## 2021-04-28 MED ORDER — ENSURE ENLIVE PO LIQD: 237.0000 mL | Freq: Three times a day (TID) | ORAL | 0 refills | Status: DC

## 2021-04-28 MED ORDER — ADULT MULTIVITAMIN W/MINERALS CH: 1.0000 | ORAL_TABLET | Freq: Every day | ORAL | 0 refills | Status: AC

## 2021-04-28 MED ORDER — AMOXICILLIN 500 MG PO CAPS: 500.0000 mg | ORAL_CAPSULE | Freq: Three times a day (TID) | ORAL | 0 refills | Status: AC

## 2021-04-28 MED ORDER — FOLIC ACID 1 MG PO TABS: 1.0000 mg | ORAL_TABLET | Freq: Every day | ORAL | 0 refills | Status: DC

## 2021-04-28 MED ORDER — HYDROCODONE-ACETAMINOPHEN 5-325 MG PO TABS: 1.0000 | ORAL_TABLET | Freq: Four times a day (QID) | ORAL | 0 refills | Status: DC | PRN

## 2021-04-28 MED ORDER — ASPIRIN 81 MG PO CHEW: 81.0000 mg | CHEWABLE_TABLET | Freq: Two times a day (BID) | ORAL | 0 refills | Status: AC

## 2021-04-28 MED ORDER — THIAMINE HCL 100 MG PO TABS: 100.0000 mg | ORAL_TABLET | Freq: Every day | ORAL | 0 refills | Status: DC

## 2021-04-28 MED ORDER — FERROUS SULFATE 325 (65 FE) MG PO TABS: 325.0000 mg | ORAL_TABLET | Freq: Three times a day (TID) | ORAL | 3 refills | Status: DC

## 2021-04-28 NOTE — Progress Notes (Signed)
Physical Therapy Treatment Patient Details Name: Judy Davis MRN: [id number] DOB: 23-Apr-1956 Today's Date: 04/28/2021    History of Present Illness 65 yo female with onset of L femoral neck fracture from a trip at home now has L THA, direct anterior approach with WBAT.  Home with husband, has had issues of EtOH.  PMHx:  Vit D deficiency, EtOH use, HTN, hyponatremia, HLD, malnutrition, hypothyroidism,    PT Comments    Pt supine in bed on arrival this session.  Pt required assistance to transfer and perform progression of gt training.  Noted to be lying on soiled pad and required assistance to change her underwear and hospital gown.  Continue to recommend snf placement at d/c.     Follow Up Recommendations  SNF     Equipment Recommendations  None recommended by PT    Recommendations for Other Services       Precautions / Restrictions Precautions Precautions: Fall Precaution Comments: confusion Restrictions Weight Bearing Restrictions: Yes LLE Weight Bearing: Weight bearing as tolerated    Mobility  Bed Mobility Overal bed mobility: Needs Assistance Bed Mobility: Supine to Sit;Sit to Supine     Supine to sit: Min assist Sit to supine: Min assist   General bed mobility comments: Min assistance to advance LLE to edge of bed.  Pt required increased time and effort this session.    Transfers Overall transfer level: Needs assistance Equipment used: Rolling walker (2 wheeled) Transfers: Sit to/from Stand Sit to Stand: From elevated surface;Min guard         General transfer comment: Cues for hand placement to rise into standing.  Ambulation/Gait Ambulation/Gait assistance: Min assist Gait Distance (Feet): 60 Feet Assistive device: Rolling walker (2 wheeled) Gait Pattern/deviations: Step-to pattern;Decreased stride length;Decreased stance time - left;Decreased weight shift to left Gait velocity: reduced   General Gait Details: Cues for sequencing RW safety and  posture.  Pt able to progress gt distance this session.  No LOB noted this session but cadence remains slow.   Stairs             Wheelchair Mobility    Modified Rankin (Stroke Patients Only)       Balance Overall balance assessment: History of Falls;Needs assistance   Sitting balance-Leahy Scale: Good       Standing balance-Leahy Scale: Poor                              Cognition Arousal/Alertness: Awake/alert Behavior During Therapy: Impulsive;WFL for tasks assessed/performed Overall Cognitive Status: No family/caregiver present to determine baseline cognitive functioning                                 General Comments: Required repeated cueing during session.      Exercises General Exercises - Lower Extremity Ankle Circles/Pumps: AROM;Both;20 reps;Supine Quad Sets: AROM;Left;10 reps;Supine Long Arc Quad: AROM;Left;10 reps;Seated Heel Slides: Left;10 reps;Supine;AAROM Hip ABduction/ADduction: AAROM;Left;10 reps;Supine    General Comments        Pertinent Vitals/Pain Pain Assessment: Faces Faces Pain Scale: Hurts little more Pain Location: L hip Pain Descriptors / Indicators: Operative site guarding Pain Intervention(s): Monitored during session;Repositioned    Home Living                      Prior Function            PT  Goals (current goals can now be found in the care plan section) Acute Rehab PT Goals Patient Stated Goal: to get up to chair Potential to Achieve Goals: Good Progress towards PT goals: Progressing toward goals    Frequency    7X/week      PT Plan Current plan remains appropriate    Co-evaluation              AM-PAC PT "6 Clicks" Mobility   Outcome Measure  Help needed turning from your back to your side while in a flat bed without using bedrails?: A Little Help needed moving from lying on your back to sitting on the side of a flat bed without using bedrails?: A Little Help  needed moving to and from a bed to a chair (including a wheelchair)?: A Little Help needed standing up from a chair using your arms (e.g., wheelchair or bedside chair)?: A Little Help needed to walk in hospital room?: A Little Help needed climbing 3-5 steps with a railing? : A Little 6 Click Score: 18    End of Session Equipment Utilized During Treatment: Gait belt Activity Tolerance: Patient limited by fatigue;Patient limited by pain;Treatment limited secondary to medical complications (Comment) Patient left: with call bell/phone within reach;in chair;with chair alarm set Nurse Communication: Mobility status PT Visit Diagnosis: Unsteadiness on feet (R26.81);Muscle weakness (generalized) (M62.81);Difficulty in walking, not elsewhere classified (R26.2)     Time: 4782-9562 PT Time Calculation (min) (ACUTE ONLY): 29 min  Charges:  $Gait Training: 8-22 mins $Therapeutic Exercise: 8-22 mins                     Bonney Leitz , PTA Acute Rehabilitation Services Pager [phone] Office [phone]    Judy Davis 04/28/2021, 2:07 PM

## 2021-04-28 NOTE — Discharge Summary (Addendum)
[facility] Discharge Summary   Patient: Judy Davis [id number]  PCP: [facility] [place]  Date of admission: 04/21/2021   Date of discharge:  04/28/2021     Discharge Diagnoses:  Principal Problem:   Closed left hip fracture ([place]) Active Problems:   Hyponatremia   HTN (hypertension)   Anemia   Hyperglycemia   Alcohol abuse   Gait instability   GERD (gastroesophageal reflux disease)   Severe Vitamin D deficiency   Malnutrition of moderate degree   Chronic pancreatitis ([place])   Hypothyroidism   Hyperlipidemia   Admitted From: home Disposition:  SNF   Recommendations for Outpatient Follow-up:  PCP: follow up with PCP in 1 week Follow up LABS/TEST:  cbc in 1 week   [place], [facility]. Schedule an appointment as soon as possible for a visit in 1 week(s).   Contact information: [place] [address] [phone]         Meredith Pel, MD. Schedule an appointment as soon as possible for a visit in 2 week(s).   Specialty: Orthopedic Surgery Contact information: [address] [place] [address] [phone]                Discharge Instructions     Diet - low sodium heart healthy   Complete by: As directed    Increase activity slowly   Complete by: As directed    Leave dressing on - Keep it clean, dry, and intact until clinic visit   Complete by: As directed        Diet recommendation: Cardiac diet  Activity: The patient is advised to gradually reintroduce usual activities, as tolerated  Discharge Condition: stable  Code Status: Full code   History of present illness: As per the H and P dictated on admission, "Judy Davis is a 65 y.o. female with medical history significant for chronic alcoholism (last drink was 2 weeks ago), vitamin D deficiency, essential hypertension, chronic hyponatremia, hyperlipidemia, gait instability and moderate to severe  malnutrition reportedly fell in her kitchen yesterday tripping over her feet. She denies that alcohol was involved.  She lives with husband.  She says that after falling she was unable to get up.  She denies hitting her head.  She reports that family lifter her onto the couch where she remained for 24 hours.  She last ate 2 days ago.  She continued to complain of left hip and thigh pain and inability to ambulate.  EMS brought her to the ED for further eval.    ED Course: Temperature 98.3, pulse rate 94, blood pressure 172/94, pulse ox 100% on room air, sodium 127, potassium 3.8, glucose 122, creatinine 0.68, hemoglobin 11.1, WBC 9.7, platelet count 270.  Ethyl alcohol level less than 10.  Chest x-ray no acute findings.  X-ray of left hip with findings of left femoral neck fracture.  EKG normal sinus rhythm.  Orthopedics Dr. Marlou Sa was consulted and requested patient be admitted to [facility] for total hip arthropathy.  Patient is NPO.  Admission was requested."  Hospital Course:  Summary of her active problems in the hospital is as following. Left femoral neck fracture: Patient presented s/p fall with left femoral neck fracture. Adequate pain control Orthopedic consulted, Underwent total left hip hemiarthroplasty. Follow up with Orthopedics    Postoperative acute blood loss anemia. Based on hemoglobin around 10. Currently hemoglobin around 7.  Stable.  No active bleeding.  No evidence of hematoma. Continue iron.    Hyponatremia: Patient does have chronic hyponatremia.   Baseline sodium runs between 127-130. Treated with IV hydration.  Will discontinue.  Add protein supplements.   GERD :   continue Protonix   Severe vitamin D deficiency: Patient is not taking any vitamin D supplements. Oral vitamin D replacement.   Hypothyroidism :  Continue Levothyroxine   Hyperlipidemia :   continue rosuvastatin   Essential hypertension :   resume home blood pressure medications    History of alcohol abuse: Patient reported lasting was 2 weeks ago. Continue thiamine   History of chronic pancreatitis :  continue Creon.   Hypokalemia. Replaced.  Body mass index is 21.47 kg/m.  Nutrition Problem: Increased nutrient needs Etiology: hip fracture, post-op healing Nutrition Interventions:  Pain control  - [place] Controlled Substance Reporting System database was reviewed. - 5 day supply was provided. - Patient was instructed, not to drive, operate heavy machinery, perform activities at heights, swimming or participation in water activities or provide baby sitting services while on Pain, Sleep and Anxiety Medications; until her outpatient Physician has advised to do so again.  - Also recommended to not to take more than prescribed Pain, Sleep and Anxiety Medications.  Patient was seen by physical therapy, who recommended SNF, . On the day of the discharge the patient's vitals were stable, and no other new acute medical condition were reported. The patient was felt safe to be discharge at SNF with no therapy needed on discharge.  Consultants: orthopedics  Procedures: Left total hip replacement, anterior approach  DISCHARGE MEDICATION: Allergies as of 04/28/2021   No Known Allergies      Medication List     TAKE these medications    amoxicillin 500 MG capsule Commonly known as: AMOXIL Take 1 capsule (500 mg total) by mouth 3 (three) times daily for 3 days.   aspirin 81 MG chewable tablet Chew 1 tablet (81 mg total) by mouth 2 (two) times daily for 14 days.   feeding supplement Liqd Take 237 mLs by mouth 3 (three) times daily between meals.   ferrous sulfate 325 (65 FE) MG tablet Take 1 tablet (325 mg total) by mouth 3 (three) times daily with meals.   folic acid 1 MG tablet Commonly known as: FOLVITE Take 1 tablet (1 mg total) by mouth daily. Start taking on: April 29, 2021   HYDROcodone-acetaminophen 5-325 MG tablet Commonly known as:  NORCO/VICODIN Take 1 tablet by mouth every 6 (six) hours as needed for moderate pain (pain score 4-6).   levothyroxine 50 MCG tablet Commonly known as: SYNTHROID Take 50 mcg by mouth daily before breakfast.   metoprolol tartrate 50 MG tablet Commonly known as: LOPRESSOR Take 50 mg by mouth 2 (two) times daily.   multivitamin with minerals Tabs tablet Take 1 tablet by mouth daily. Start taking on: April 29, 2021   polyethylene glycol 17 g packet Commonly known as: MIRALAX / GLYCOLAX Take 17 g by mouth daily. Start taking on: April 29, 2021   rosuvastatin 5 MG tablet Commonly known as: CRESTOR Take 5 mg by mouth daily.   thiamine 100 MG tablet Take 1 tablet (100 mg total) by mouth daily. Start taking on: April 29, 2021   Vitamin D (Ergocalciferol) 1.25 MG (50000 UNIT) Caps capsule Commonly known as: DRISDOL Take 1 capsule (50,000 Units total) by mouth every 7 (seven) days. Start taking on: April 29, 2021  Discharge Care Instructions  (From admission, onward)           Start     Ordered   04/28/21 0000  Leave dressing on - Keep it clean, dry, and intact until clinic visit        04/28/21 1220            Discharge Exam: Filed Weights   04/21/21 0739  Weight: 60.3 kg   Vitals:   04/28/21 0549 04/28/21 0751  BP: 129/88 136/77  Pulse: 87 79  Resp: 17 16  Temp: 98.1 F (36.7 C) 98.2 F (36.8 C)  SpO2: 100%    General: Appear in no distress, no Rash; Oral Mucosa Clear, moist. no Abnormal Neck Mass Or lumps, Conjunctiva normal  Cardiovascular: S1 and S2 Present, no Murmur Respiratory: good respiratory effort, Bilateral Air entry present and CTA, no Crackles, no wheezes Abdomen: Bowel Sound present, Soft and no tenderness Extremities: no Pedal edema Neurology: alert and oriented to time, place, and person affect appropriate. no new focal deficit  The results of significant diagnostics from this hospitalization (including imaging,  microbiology, ancillary and laboratory) are listed below for reference.    Significant Diagnostic Studies: DG Chest 1 View  Result Date: 04/21/2021 CLINICAL DATA:  LEFT hip fracture. EXAM: CHEST  1 VIEW COMPARISON:  02/28/2020 FINDINGS: The cardiomediastinal silhouette is unremarkable. There is no evidence of focal airspace disease, pulmonary edema, suspicious pulmonary nodule/mass, pleural effusion, or pneumothorax. No acute bony abnormalities are identified. Remote LEFT rib fractures are again identified. IMPRESSION: No active disease. Electronically Signed   By: Margarette Canada M.D.   On: 04/21/2021 09:48   DG Pelvis 1-2 Views  Result Date: 04/22/2021 CLINICAL DATA:  Known femoral neck fracture EXAM: PELVIS - 1-2 VIEW COMPARISON:  Hip radiographs obtained earlier the same day FINDINGS: Again seen is a displaced fracture of the left femoral neck with superior and mild lateral displacement of the distal fragment. Varus angulation appears slightly increased from prior, though this may be positional. No other fracture is seen.  Right hip alignment is normal. Vascular calcifications are noted. IMPRESSION: Left femoral neck fracture is again seen. Apparent increase in varus angulation may be positional. Electronically Signed   By: Valetta Mole MD   On: 04/22/2021 15:11   Pelvis Portable  Result Date: 04/23/2021 CLINICAL DATA:  PACU image, left hip replacement EXAM: PORTABLE PELVIS 1-2 VIEWS COMPARISON:  Pelvic radiograph 04/22/2021, fluoroscopy 04/23/2021 FINDINGS: Interval placement of a total left hip arthroplasty with expected postsurgical soft tissue changes including adjacent soft tissue swelling, soft tissue gas. Hardware appears in expected alignment. No acute complication is radiographically evident. Degenerative changes remain in the right hip. Vascular calcium in numerous phleboliths in the pelvis. Remaining soft tissues are unremarkable. IMPRESSION: Expected postoperative appearance of total left hip  arthroplasty. Electronically Signed   By: Lovena Le M.D.   On: 04/23/2021 19:43   DG C-Arm 1-60 Min  Result Date: 04/23/2021 CLINICAL DATA:  Surgery, elective Z41.9 (ICD-10-CM). Additional history provided: Left total hip arthroplasty anterior approach. Provided fluoroscopy time 49 seconds (2.98 mGy). EXAM: DG HIP (WITH OR WITHOUT PELVIS) 2-3V LEFT; DG C-ARM 1-60 MIN COMPARISON:  Radiograph of the pelvis 04/22/2021. Radiographs of the left femur 04/22/2021. FINDINGS: Three intraoperative fluoroscopic images of the left hip are submitted. On the provided images, there are findings of interval left total hip arthroplasty. The femoral and acetabular components appear well seated. No unexpected finding on the provided views. IMPRESSION: Three intraoperative fluoroscopic images  of the left hip from left total hip arthroplasty. Electronically Signed   By: Kellie Simmering DO   On: 04/23/2021 18:22   DG HIP UNILAT WITH PELVIS 2-3 VIEWS LEFT  Result Date: 04/23/2021 CLINICAL DATA:  Surgery, elective Z41.9 (ICD-10-CM). Additional history provided: Left total hip arthroplasty anterior approach. Provided fluoroscopy time 49 seconds (2.98 mGy). EXAM: DG HIP (WITH OR WITHOUT PELVIS) 2-3V LEFT; DG C-ARM 1-60 MIN COMPARISON:  Radiograph of the pelvis 04/22/2021. Radiographs of the left femur 04/22/2021. FINDINGS: Three intraoperative fluoroscopic images of the left hip are submitted. On the provided images, there are findings of interval left total hip arthroplasty. The femoral and acetabular components appear well seated. No unexpected finding on the provided views. IMPRESSION: Three intraoperative fluoroscopic images of the left hip from left total hip arthroplasty. Electronically Signed   By: Kellie Simmering DO   On: 04/23/2021 18:22   DG Hip Unilat With Pelvis 2-3 Views Left  Result Date: 04/21/2021 CLINICAL DATA:  Acute LEFT hip pain following fall. Initial encounter. EXAM: DG HIP (WITH OR WITHOUT PELVIS) 2-3V LEFT  COMPARISON:  None. FINDINGS: A LEFT femoral neck fracture is identified with 2 cm SUPERIOR displacement and varus angulation. There is no evidence of dislocation. No other significant bony abnormalities are noted. IMPRESSION: LEFT femoral neck fracture with 2 cm SUPERIOR displacement and varus angulation. Electronically Signed   By: Margarette Canada M.D.   On: 04/21/2021 09:47   DG Femur Min 2 Views Left  Result Date: 04/22/2021 CLINICAL DATA:  Re-evaluate fracture EXAM: LEFT FEMUR 2 VIEWS COMPARISON:  X-ray dated April 21, 2021 FINDINGS: Redemonstrated left femoral neck fracture with 3.0 cm of superior displacement, previously 2.0 cm. Unchanged varus angulation. No new fractures identified. IMPRESSION: Left femoral neck fracture with increased superior displacement compared to prior exam. Electronically Signed   By: Yetta Glassman MD   On: 04/22/2021 15:12    Microbiology: Recent Results (from the past 240 hour(s))  Resp Panel by RT-PCR (Flu A&B, Covid) Nasopharyngeal Swab     Status: None   Collection Time: 04/21/21  8:17 AM   Specimen: Nasopharyngeal Swab; Nasopharyngeal(NP) swabs in vial transport medium  Result Value Ref Range Status   SARS Coronavirus 2 by RT PCR NEGATIVE NEGATIVE Final    Comment: (NOTE) SARS-CoV-2 target nucleic acids are NOT DETECTED.  The SARS-CoV-2 RNA is generally detectable in upper respiratory specimens during the acute phase of infection. The lowest concentration of SARS-CoV-2 viral copies this assay can detect is 138 copies/mL. A negative result does not preclude SARS-Cov-2 infection and should not be used as the sole basis for treatment or other patient management decisions. A negative result may occur with  improper specimen collection/handling, submission of specimen other than nasopharyngeal swab, presence of viral mutation(s) within the areas targeted by this assay, and inadequate number of viral copies(<138 copies/mL). A negative result must be combined  with clinical observations, patient history, and epidemiological information. The expected result is Negative.  Fact Sheet for Patients:  EntrepreneurPulse.com.au  Fact Sheet for Healthcare Providers:  IncredibleEmployment.be  This test is no t yet approved or cleared by the [place] FDA and  has been authorized for detection and/or diagnosis of SARS-CoV-2 by FDA under an Emergency Use Authorization (EUA). This EUA will remain  in effect (meaning this test can be used) for the duration of the COVID-19 declaration under Section 564(b)(1) of the Act, 21 U.S.C.section 360bbb-3(b)(1), unless the authorization is terminated  or revoked sooner.  Influenza A by PCR NEGATIVE NEGATIVE Final   Influenza B by PCR NEGATIVE NEGATIVE Final    Comment: (NOTE) The Xpert Xpress SARS-CoV-2/FLU/RSV plus assay is intended as an aid in the diagnosis of influenza from Nasopharyngeal swab specimens and should not be used as a sole basis for treatment. Nasal washings and aspirates are unacceptable for Xpert Xpress SARS-CoV-2/FLU/RSV testing.  Fact Sheet for Patients: EntrepreneurPulse.com.au  Fact Sheet for Healthcare Providers: IncredibleEmployment.be  This test is not yet approved or cleared by the [place] FDA and has been authorized for detection and/or diagnosis of SARS-CoV-2 by FDA under an Emergency Use Authorization (EUA). This EUA will remain in effect (meaning this test can be used) for the duration of the COVID-19 declaration under Section 564(b)(1) of the Act, 21 U.S.C. section 360bbb-3(b)(1), unless the authorization is terminated or revoked.  Performed at [facility], [address], [place], [place] [address]   Surgical pcr screen     Status: None   Collection Time: 04/23/21  1:33 PM   Specimen: Nasal Mucosa; Nasal Swab  Result Value Ref Range Status   MRSA, PCR NEGATIVE NEGATIVE Final    Staphylococcus aureus NEGATIVE NEGATIVE Final    Comment: (NOTE) The Xpert SA Assay (FDA approved for NASAL specimens in patients 75 years of age and older), is one component of a comprehensive surveillance program. It is not intended to diagnose infection nor to guide or monitor treatment. Performed at [facility], [place] [address] [place], [place] [address]   Urine Culture     Status: Abnormal   Collection Time: 04/23/21  4:40 PM   Specimen: Urine, Catheterized  Result Value Ref Range Status   Specimen Description URINE, CATHETERIZED  Final   Special Requests   Final    NONE Performed at [facility], [place] [address], [place],  [address]    Culture (A)  Final    >=100,000 COLONIES/mL ESCHERICHIA COLI >=100,000 COLONIES/mL ENTEROCOCCUS FAECALIS    Report Status 04/26/2021 FINAL  Final   Organism ID, Bacteria ESCHERICHIA COLI (A)  Final   Organism ID, Bacteria ENTEROCOCCUS FAECALIS (A)  Final      Susceptibility   Escherichia coli - MIC*    AMPICILLIN 4 SENSITIVE Sensitive     CEFAZOLIN <=4 SENSITIVE Sensitive     CEFEPIME <=0.12 SENSITIVE Sensitive     CEFTRIAXONE <=0.25 SENSITIVE Sensitive     CIPROFLOXACIN <=0.25 SENSITIVE Sensitive     GENTAMICIN <=1 SENSITIVE Sensitive     IMIPENEM <=0.25 SENSITIVE Sensitive     NITROFURANTOIN <=16 SENSITIVE Sensitive     TRIMETH/SULFA <=20 SENSITIVE Sensitive     AMPICILLIN/SULBACTAM <=2 SENSITIVE Sensitive     PIP/TAZO <=4 SENSITIVE Sensitive     * >=100,000 COLONIES/mL ESCHERICHIA COLI   Enterococcus faecalis - MIC*    AMPICILLIN <=2 SENSITIVE Sensitive     NITROFURANTOIN <=16 SENSITIVE Sensitive     VANCOMYCIN 1 SENSITIVE Sensitive     * >=100,000 COLONIES/mL ENTEROCOCCUS FAECALIS  SARS CORONAVIRUS 2 (TAT 6-24 HRS) Nasopharyngeal Nasopharyngeal Swab     Status: None   Collection Time: 04/27/21  1:56 PM   Specimen: Nasopharyngeal Swab  Result Value Ref Range Status   SARS Coronavirus 2 NEGATIVE NEGATIVE Final     Comment: (NOTE) SARS-CoV-2 target nucleic acids are NOT DETECTED.  The SARS-CoV-2 RNA is generally detectable in upper and lower respiratory specimens during the acute phase of infection. Negative results do not preclude SARS-CoV-2 infection, do not rule out co-infections  with other pathogens, and should not be used as the sole basis for treatment or other patient management decisions. Negative results must be combined with clinical observations, patient history, and epidemiological information. The expected result is Negative.  Fact Sheet for Patients: SugarRoll.be  Fact Sheet for Healthcare Providers: https://www.woods-mathews.com/  This test is not yet approved or cleared by the [place] FDA and  has been authorized for detection and/or diagnosis of SARS-CoV-2 by FDA under an Emergency Use Authorization (EUA). This EUA will remain  in effect (meaning this test can be used) for the duration of the COVID-19 declaration under Se ction 564(b)(1) of the Act, 21 U.S.C. section 360bbb-3(b)(1), unless the authorization is terminated or revoked sooner.  Performed at [facility], [place] [address], [place], [place] [address]      Labs: CBC: Recent Labs  Lab 04/23/21 0110 04/24/21 0041 04/25/21 0115 04/26/21 0111 04/26/21 1853 04/27/21 0212  WBC 8.5 10.0 10.7* 11.6* 11.2* 10.3  NEUTROABS 5.9  --  7.7 8.0*  --  6.5  HGB 10.5* 8.9* 7.9* 7.7* 7.5* 7.7*  HCT 30.7* 28.1* 23.9* 23.5* 23.4* 22.9*  MCV 94.2 97.9 94.5 94.4 95.9 94.6  PLT 296 287 255 310 339 0000000   Basic Metabolic Panel: Recent Labs  Lab 04/22/21 0536 04/23/21 0110 04/24/21 0041 04/25/21 0115 04/26/21 0111 04/26/21 1853 04/27/21 0212  NA 129*   < > 129* 127* 126* 124* 128*  K 3.3*   < > 3.9 3.5 3.2* 4.3 4.4  CL 97*   < > 98 91* 91* 90* 91*  CO2 24   < > '23 24 28 27 30  '$ GLUCOSE 113*   < > 100* 102* 148* 139* 119*  BUN 10   < > 6* 5* 9 18 27*  CREATININE  0.59   < > 0.62 0.73 0.63 0.66 0.60  CALCIUM 8.9   < > 8.8* 8.8* 8.9 8.7* 9.4  MG 2.0  --  1.6*  --   --  1.7  --   PHOS 3.6  --  3.4  --   --   --   --    < > = values in this interval not displayed.   Liver Function Tests: Recent Labs  Lab 04/22/21 0536  AST 18  ALT 11  ALKPHOS 50  BILITOT 1.1  PROT 7.6  ALBUMIN 3.7   CBG: No results for input(s): GLUCAP in the last 168 hours.  Time spent: 35 minutes  Signed:  Berle Mull  [facility]  04/28/2021

## 2021-04-28 NOTE — TOC Progression Note (Addendum)
Transition of Care [facility]) - Progression Note    Patient Details  Name: Judy Davis MRN: [id number] Date of Birth: 1956/07/21  Transition [facility] CM/SW Contact  Elliot Gurney [place], [place] Phone Number: [phone] 04/28/2021, 1:42 PM  Clinical Narrative:    Phone call to [facility] to follow up on discharge there today. Spoke with Jackelyn Poling who will confirm and call this social worker back.  2:05pm Return call from [place] at [facility], patient will be able to transfer to the facility on 04/29/21.  [address], LCSW Transition of Care [phone]    Expected Discharge Plan: Skilled Nursing Facility Barriers to Discharge: Continued Medical Work up  Expected Discharge Plan and Services Expected Discharge Plan: [place] arrangements for the past 2 months: Single Family Home Expected Discharge Date: 04/28/21                                     Social Determinants of Health (SDOH) Interventions    Readmission Risk Interventions No flowsheet data found.

## 2021-04-29 DIAGNOSIS — S72002D Fracture of unspecified part of neck of left femur, subsequent encounter for closed fracture with routine healing: Secondary | ICD-10-CM | POA: Diagnosis not present

## 2021-04-29 NOTE — Progress Notes (Signed)
Physical Therapy Treatment Patient Details Name: Judy Davis MRN: [id number] DOB: [birth year] Today's Date: 04/29/2021    History of Present Illness 65 yo female with onset of L femoral neck fracture from a trip at home now has L THA, direct anterior approach with WBAT.  Home with husband, has had issues of EtOH.  PMHx:  Vit D deficiency, EtOH use, HTN, hyponatremia, HLD, malnutrition, hypothyroidism,    PT Comments    Pt supine in bed on arrival this session.  Pt required assistance to move to edge of bed but reports increased pain in L thigh.  PTA applied ice and informed nurse of need for pain meds.  Pt resting in supine awaiting transport to rehab.       Follow Up Recommendations  SNF     Equipment Recommendations  None recommended by PT    Recommendations for Other Services       Precautions / Restrictions Precautions Precautions: Fall Precaution Comments: confusion Restrictions Weight Bearing Restrictions: Yes LLE Weight Bearing: Weight bearing as tolerated    Mobility  Bed Mobility Overal bed mobility: Needs Assistance Bed Mobility: Supine to Sit;Sit to Supine     Supine to sit: Min assist Sit to supine: Min guard   General bed mobility comments: Min assistance to advance LLE to edge of bed.  Pt required increased time and effort this session.    Transfers Overall transfer level: Needs assistance Equipment used: Rolling walker (2 wheeled) Transfers: Sit to/from Stand Sit to Stand: From elevated surface;Min guard         General transfer comment: Cues for hand placement to rise into standing.  Ambulation/Gait Ambulation/Gait assistance: Min assist Gait Distance (Feet): 30 Feet Assistive device: Rolling walker (2 wheeled) Gait Pattern/deviations: Step-to pattern;Decreased stride length;Decreased stance time - left;Decreased weight shift to left     General Gait Details: Cues for sequencing RW safety and posture.  Pt reports more pain this pm and  therefore decreased gt distance this session.   Stairs             Wheelchair Mobility    Modified Rankin (Stroke Patients Only)       Balance Overall balance assessment: History of Falls;Needs assistance   Sitting balance-Leahy Scale: Good       Standing balance-Leahy Scale: Poor                              Cognition Arousal/Alertness: Awake/alert Behavior During Therapy: Impulsive;WFL for tasks assessed/performed Overall Cognitive Status: No family/caregiver present to determine baseline cognitive functioning                                 General Comments: Required repeated cueing during session.      Exercises General Exercises - Lower Extremity Ankle Circles/Pumps: AROM;Both;20 reps;Supine Quad Sets: AROM;Left;10 reps;Supine Long Arc Quad: AROM;Left;10 reps;Seated Heel Slides: Left;10 reps;Supine;AAROM Hip ABduction/ADduction: AAROM;Left;10 reps;Supine    General Comments        Pertinent Vitals/Pain Pain Assessment: Faces Faces Pain Scale: Hurts little more Pain Location: L hip Pain Descriptors / Indicators: Operative site guarding Pain Intervention(s): Monitored during session;Repositioned    Home Living                      Prior Function            PT Goals (current goals can  now be found in the care plan section) Acute Rehab PT Goals Patient Stated Goal: to get up to chair Potential to Achieve Goals: Good Progress towards PT goals: Progressing toward goals    Frequency    7X/week      PT Plan Current plan remains appropriate    Co-evaluation              AM-PAC PT "6 Clicks" Mobility   Outcome Measure  Help needed turning from your back to your side while in a flat bed without using bedrails?: A Little Help needed moving from lying on your back to sitting on the side of a flat bed without using bedrails?: A Little Help needed moving to and from a bed to a chair (including a  wheelchair)?: A Little Help needed standing up from a chair using your arms (e.g., wheelchair or bedside chair)?: A Little Help needed to walk in hospital room?: A Little Help needed climbing 3-5 steps with a railing? : A Little 6 Click Score: 18    End of Session Equipment Utilized During Treatment: Gait belt Activity Tolerance: Patient limited by fatigue;Patient limited by pain;Treatment limited secondary to medical complications (Comment) Patient left: with call bell/phone within reach;in chair;with chair alarm set Nurse Communication: Mobility status PT Visit Diagnosis: Unsteadiness on feet (R26.81);Muscle weakness (generalized) (M62.81);Difficulty in walking, not elsewhere classified (R26.2)     Time: 1610-9604 PT Time Calculation (min) (ACUTE ONLY): 17 min  Charges:  $Gait Training: 8-22 mins                     Bonney Leitz , PTA Acute Rehabilitation Services Pager [phone] Office [phone]    Faaris Arizpe Artis Delay 04/29/2021, 4:09 PM

## 2021-04-29 NOTE — Progress Notes (Signed)
Report called to [facility] with Rennis Golden, RN who will be patients nurse in Room B14 at their facility. Nurse verbalized understanding of report and discharge teaching, and had no further questions. Patient currently waiting in room for transportation to come and pick her up.

## 2021-04-29 NOTE — Progress Notes (Signed)
Patient dischared to [facility] with all belongings. IV removed before discharge.

## 2021-04-29 NOTE — Discharge Summary (Signed)
[facility] Discharge Summary   Patient: Judy Davis [id number]  PCP: [facility] [place]  Date of admission: 04/21/2021   Date of discharge:  04/29/2021     Discharge Diagnoses:  Principal Problem:   Closed left hip fracture ([place]) Active Problems:   Hyponatremia   HTN (hypertension)   Anemia   Hyperglycemia   Alcohol abuse   Gait instability   GERD (gastroesophageal reflux disease)   Severe Vitamin D deficiency   Malnutrition of moderate degree   Chronic pancreatitis ([place])   Hypothyroidism   Hyperlipidemia   Admitted From: home Disposition:  SNF   Recommendations for Outpatient Follow-up:  PCP: follow up with PCP in 1 week Follow up LABS/TEST:  cbc in 1 week   [place], [facility]. Schedule an appointment as soon as possible for a visit in 1 week(s).   Contact information: [place] [address] [phone]         Meredith Pel, MD. Schedule an appointment as soon as possible for a visit in 2 week(s).   Specialty: Orthopedic Surgery Contact information: [address] [place] [address] [phone]                Discharge Instructions     Diet - low sodium heart healthy   Complete by: As directed    Increase activity slowly   Complete by: As directed    Leave dressing on - Keep it clean, dry, and intact until clinic visit   Complete by: As directed        Diet recommendation: Cardiac diet  Activity: The patient is advised to gradually reintroduce usual activities, as tolerated  Discharge Condition: stable  Code Status: Full code   History of present illness: As per the H and P dictated on admission, "Judy Davis is a 65 y.o. female with medical history significant for chronic alcoholism (last drink was 2 weeks ago), vitamin D deficiency, essential hypertension, chronic hyponatremia, hyperlipidemia, gait instability and moderate to severe  malnutrition reportedly fell in her kitchen yesterday tripping over her feet. She denies that alcohol was involved.  She lives with husband.  She says that after falling she was unable to get up.  She denies hitting her head.  She reports that family lifter her onto the couch where she remained for 24 hours.  She last ate 2 days ago.  She continued to complain of left hip and thigh pain and inability to ambulate.  EMS brought her to the ED for further eval.    ED Course: Temperature 98.3, pulse rate 94, blood pressure 172/94, pulse ox 100% on room air, sodium 127, potassium 3.8, glucose 122, creatinine 0.68, hemoglobin 11.1, WBC 9.7, platelet count 270.  Ethyl alcohol level less than 10.  Chest x-ray no acute findings.  X-ray of left hip with findings of left femoral neck fracture.  EKG normal sinus rhythm.  Orthopedics Dr. Marlou Sa was consulted and requested patient be admitted to [facility] for total hip arthropathy.  Patient is NPO.  Admission was requested."  Hospital Course:  Summary of her active problems in the hospital is as following. Left femoral neck fracture: Patient presented s/p fall with left femoral neck fracture. Adequate pain control Orthopedic consulted, Underwent total left hip hemiarthroplasty. Follow up with Orthopedics    Postoperative acute blood loss anemia. Based on hemoglobin around 10. Currently hemoglobin around 7.  Stable.  No active bleeding.  No evidence of hematoma. Continue iron.    Hyponatremia: Patient does have chronic hyponatremia.   Baseline sodium runs between 127-130. Treated with IV hydration.  Will discontinue.  Add protein supplements.   GERD :   continue Protonix   Severe vitamin D deficiency: Patient is not taking any vitamin D supplements. Oral vitamin D replacement.   Hypothyroidism :  Continue Levothyroxine   Hyperlipidemia :   continue rosuvastatin   Essential hypertension :   resume home blood pressure medications    History of alcohol abuse: Patient reported lasting was 2 weeks ago. Continue thiamine   History of chronic pancreatitis :  continue Creon.   Hypokalemia. Replaced.  Body mass index is 21.47 kg/m.  Nutrition Problem: Increased nutrient needs Etiology: hip fracture, post-op healing Nutrition Interventions:  Pain control  - [place] Controlled Substance Reporting System database was reviewed. - 5 day supply was provided. - Patient was instructed, not to drive, operate heavy machinery, perform activities at heights, swimming or participation in water activities or provide baby sitting services while on Pain, Sleep and Anxiety Medications; until her outpatient Physician has advised to do so again.  - Also recommended to not to take more than prescribed Pain, Sleep and Anxiety Medications.  Patient was seen by physical therapy, who recommended SNF, . On the day of the discharge the patient's vitals were stable, and no other new acute medical condition were reported. The patient was felt safe to be discharge at SNF with no therapy needed on discharge.  Consultants: orthopedics  Procedures: Left total hip replacement, anterior approach  DISCHARGE MEDICATION: Allergies as of 04/29/2021   No Known Allergies      Medication List     TAKE these medications    amoxicillin 500 MG capsule Commonly known as: AMOXIL Take 1 capsule (500 mg total) by mouth 3 (three) times daily for 3 days.   aspirin 81 MG chewable tablet Chew 1 tablet (81 mg total) by mouth 2 (two) times daily for 14 days.   feeding supplement Liqd Take 237 mLs by mouth 3 (three) times daily between meals.   ferrous sulfate 325 (65 FE) MG tablet Take 1 tablet (325 mg total) by mouth 3 (three) times daily with meals.   folic acid 1 MG tablet Commonly known as: FOLVITE Take 1 tablet (1 mg total) by mouth daily.   HYDROcodone-acetaminophen 5-325 MG tablet Commonly known as: NORCO/VICODIN Take 1 tablet by  mouth every 6 (six) hours as needed for moderate pain (pain score 4-6).   levothyroxine 50 MCG tablet Commonly known as: SYNTHROID Take 50 mcg by mouth daily before breakfast.   metoprolol tartrate 50 MG tablet Commonly known as: LOPRESSOR Take 50 mg by mouth 2 (two) times daily.   multivitamin with minerals Tabs tablet Take 1 tablet by mouth daily.   polyethylene glycol 17 g packet Commonly known as: MIRALAX / GLYCOLAX Take 17 g by mouth daily.   rosuvastatin 5 MG tablet Commonly known as: CRESTOR Take 5 mg by mouth daily.   thiamine 100 MG tablet Take 1 tablet (100 mg total) by mouth daily.   Vitamin D (Ergocalciferol) 1.25 MG (50000 UNIT) Caps capsule Commonly known as: DRISDOL Take 1 capsule (50,000 Units total) by mouth every 7 (seven) days.               Discharge Care Instructions  (From admission, onward)           Start     Ordered  04/28/21 0000  Leave dressing on - Keep it clean, dry, and intact until clinic visit        04/28/21 1220            Discharge Exam: [facility] Weights   04/21/21 0739  Weight: 60.3 kg   Vitals:   04/29/21 0442 04/29/21 0858  BP: 112/61 (!) 141/65  Pulse: 72 76  Resp: 17 18  Temp: 98 F (36.7 C) 97.8 F (36.6 C)  SpO2: 100% 100%   General: Appear in mild distress, no Rash; Oral Mucosa Clear, moist. no Abnormal Neck Mass Or lumps, Conjunctiva normal  Cardiovascular: S1 and S2 Present, no Murmur, Respiratory: good respiratory effort, Bilateral Air entry present and CTA, no Crackles, no wheezes Abdomen: Bowel Sound present, Soft and no tenderness Extremities: no Pedal edema Neurology: alert and oriented to time, place, and person affect appropriate. no new focal deficit Gait not checked due to patient safety concerns   The results of significant diagnostics from this hospitalization (including imaging, microbiology, ancillary and laboratory) are listed below for reference.    Significant Diagnostic  Studies: DG Chest 1 View  Result Date: 04/21/2021 CLINICAL DATA:  LEFT hip fracture. EXAM: CHEST  1 VIEW COMPARISON:  02/28/2020 FINDINGS: The cardiomediastinal silhouette is unremarkable. There is no evidence of focal airspace disease, pulmonary edema, suspicious pulmonary nodule/mass, pleural effusion, or pneumothorax. No acute bony abnormalities are identified. Remote LEFT rib fractures are again identified. IMPRESSION: No active disease. Electronically Signed   By: Margarette Canada M.D.   On: 04/21/2021 09:48   DG Pelvis 1-2 Views  Result Date: 04/22/2021 CLINICAL DATA:  Known femoral neck fracture EXAM: PELVIS - 1-2 VIEW COMPARISON:  Hip radiographs obtained earlier the same day FINDINGS: Again seen is a displaced fracture of the left femoral neck with superior and mild lateral displacement of the distal fragment. Varus angulation appears slightly increased from prior, though this may be positional. No other fracture is seen.  Right hip alignment is normal. Vascular calcifications are noted. IMPRESSION: Left femoral neck fracture is again seen. Apparent increase in varus angulation may be positional. Electronically Signed   By: Valetta Mole MD   On: 04/22/2021 15:11   Pelvis Portable  Result Date: 04/23/2021 CLINICAL DATA:  PACU image, left hip replacement EXAM: PORTABLE PELVIS 1-2 VIEWS COMPARISON:  Pelvic radiograph 04/22/2021, fluoroscopy 04/23/2021 FINDINGS: Interval placement of a total left hip arthroplasty with expected postsurgical soft tissue changes including adjacent soft tissue swelling, soft tissue gas. Hardware appears in expected alignment. No acute complication is radiographically evident. Degenerative changes remain in the right hip. Vascular calcium in numerous phleboliths in the pelvis. Remaining soft tissues are unremarkable. IMPRESSION: Expected postoperative appearance of total left hip arthroplasty. Electronically Signed   By: Lovena Le M.D.   On: 04/23/2021 19:43   DG C-Arm 1-60  Min  Result Date: 04/23/2021 CLINICAL DATA:  Surgery, elective Z41.9 (ICD-10-CM). Additional history provided: Left total hip arthroplasty anterior approach. Provided fluoroscopy time 49 seconds (2.98 mGy). EXAM: DG HIP (WITH OR WITHOUT PELVIS) 2-3V LEFT; DG C-ARM 1-60 MIN COMPARISON:  Radiograph of the pelvis 04/22/2021. Radiographs of the left femur 04/22/2021. FINDINGS: Three intraoperative fluoroscopic images of the left hip are submitted. On the provided images, there are findings of interval left total hip arthroplasty. The femoral and acetabular components appear well seated. No unexpected finding on the provided views. IMPRESSION: Three intraoperative fluoroscopic images of the left hip from left total hip arthroplasty. Electronically Signed   By: Marylyn Ishihara  Golden DO   On: 04/23/2021 18:22   DG HIP UNILAT WITH PELVIS 2-3 VIEWS LEFT  Result Date: 04/23/2021 CLINICAL DATA:  Surgery, elective Z41.9 (ICD-10-CM). Additional history provided: Left total hip arthroplasty anterior approach. Provided fluoroscopy time 49 seconds (2.98 mGy). EXAM: DG HIP (WITH OR WITHOUT PELVIS) 2-3V LEFT; DG C-ARM 1-60 MIN COMPARISON:  Radiograph of the pelvis 04/22/2021. Radiographs of the left femur 04/22/2021. FINDINGS: Three intraoperative fluoroscopic images of the left hip are submitted. On the provided images, there are findings of interval left total hip arthroplasty. The femoral and acetabular components appear well seated. No unexpected finding on the provided views. IMPRESSION: Three intraoperative fluoroscopic images of the left hip from left total hip arthroplasty. Electronically Signed   By: Kellie Simmering DO   On: 04/23/2021 18:22   DG Hip Unilat With Pelvis 2-3 Views Left  Result Date: 04/21/2021 CLINICAL DATA:  Acute LEFT hip pain following fall. Initial encounter. EXAM: DG HIP (WITH OR WITHOUT PELVIS) 2-3V LEFT COMPARISON:  None. FINDINGS: A LEFT femoral neck fracture is identified with 2 cm SUPERIOR displacement  and varus angulation. There is no evidence of dislocation. No other significant bony abnormalities are noted. IMPRESSION: LEFT femoral neck fracture with 2 cm SUPERIOR displacement and varus angulation. Electronically Signed   By: Margarette Canada M.D.   On: 04/21/2021 09:47   DG Femur Min 2 Views Left  Result Date: 04/22/2021 CLINICAL DATA:  Re-evaluate fracture EXAM: LEFT FEMUR 2 VIEWS COMPARISON:  X-ray dated April 21, 2021 FINDINGS: Redemonstrated left femoral neck fracture with 3.0 cm of superior displacement, previously 2.0 cm. Unchanged varus angulation. No new fractures identified. IMPRESSION: Left femoral neck fracture with increased superior displacement compared to prior exam. Electronically Signed   By: Yetta Glassman MD   On: 04/22/2021 15:12    Microbiology: Recent Results (from the past 240 hour(s))  Resp Panel by RT-PCR (Flu A&B, Covid) Nasopharyngeal Swab     Status: None   Collection Time: 04/21/21  8:17 AM   Specimen: Nasopharyngeal Swab; Nasopharyngeal(NP) swabs in vial transport medium  Result Value Ref Range Status   SARS Coronavirus 2 by RT PCR NEGATIVE NEGATIVE Final    Comment: (NOTE) SARS-CoV-2 target nucleic acids are NOT DETECTED.  The SARS-CoV-2 RNA is generally detectable in upper respiratory specimens during the acute phase of infection. The lowest concentration of SARS-CoV-2 viral copies this assay can detect is 138 copies/mL. A negative result does not preclude SARS-Cov-2 infection and should not be used as the sole basis for treatment or other patient management decisions. A negative result may occur with  improper specimen collection/handling, submission of specimen other than nasopharyngeal swab, presence of viral mutation(s) within the areas targeted by this assay, and inadequate number of viral copies(<138 copies/mL). A negative result must be combined with clinical observations, patient history, and epidemiological information. The expected result is  Negative.  Fact Sheet for Patients:  EntrepreneurPulse.com.au  Fact Sheet for Healthcare Providers:  IncredibleEmployment.be  This test is no t yet approved or cleared by the [place] FDA and  has been authorized for detection and/or diagnosis of SARS-CoV-2 by FDA under an Emergency Use Authorization (EUA). This EUA will remain  in effect (meaning this test can be used) for the duration of the COVID-19 declaration under Section 564(b)(1) of the Act, 21 U.S.C.section 360bbb-3(b)(1), unless the authorization is terminated  or revoked sooner.       Influenza A by PCR NEGATIVE NEGATIVE Final   Influenza B by PCR  NEGATIVE NEGATIVE Final    Comment: (NOTE) The Xpert Xpress SARS-CoV-2/FLU/RSV plus assay is intended as an aid in the diagnosis of influenza from Nasopharyngeal swab specimens and should not be used as a sole basis for treatment. Nasal washings and aspirates are unacceptable for Xpert Xpress SARS-CoV-2/FLU/RSV testing.  Fact Sheet for Patients: EntrepreneurPulse.com.au  Fact Sheet for Healthcare Providers: IncredibleEmployment.be  This test is not yet approved or cleared by the [place] FDA and has been authorized for detection and/or diagnosis of SARS-CoV-2 by FDA under an Emergency Use Authorization (EUA). This EUA will remain in effect (meaning this test can be used) for the duration of the COVID-19 declaration under Section 564(b)(1) of the Act, 21 U.S.C. section 360bbb-3(b)(1), unless the authorization is terminated or revoked.  Performed at [facility], [address], [place], [place] [address]   Surgical pcr screen     Status: None   Collection Time: 04/23/21  1:33 PM   Specimen: Nasal Mucosa; Nasal Swab  Result Value Ref Range Status   MRSA, PCR NEGATIVE NEGATIVE Final   Staphylococcus aureus NEGATIVE NEGATIVE Final    Comment: (NOTE) The Xpert SA Assay (FDA approved for  NASAL specimens in patients 15 years of age and older), is one component of a comprehensive surveillance program. It is not intended to diagnose infection nor to guide or monitor treatment. Performed at [facility], [place] [address] [place], [place] [address]   Urine Culture     Status: Abnormal   Collection Time: 04/23/21  4:40 PM   Specimen: Urine, Catheterized  Result Value Ref Range Status   Specimen Description URINE, CATHETERIZED  Final   Special Requests   Final    NONE Performed at [facility], [place] [address], [place], [place] [address]    Culture (A)  Final    >=100,000 COLONIES/mL ESCHERICHIA COLI >=100,000 COLONIES/mL ENTEROCOCCUS FAECALIS    Report Status 04/26/2021 FINAL  Final   Organism ID, Bacteria ESCHERICHIA COLI (A)  Final   Organism ID, Bacteria ENTEROCOCCUS FAECALIS (A)  Final      Susceptibility   Escherichia coli - MIC*    AMPICILLIN 4 SENSITIVE Sensitive     CEFAZOLIN <=4 SENSITIVE Sensitive     CEFEPIME <=0.12 SENSITIVE Sensitive     CEFTRIAXONE <=0.25 SENSITIVE Sensitive     CIPROFLOXACIN <=0.25 SENSITIVE Sensitive     GENTAMICIN <=1 SENSITIVE Sensitive     IMIPENEM <=0.25 SENSITIVE Sensitive     NITROFURANTOIN <=16 SENSITIVE Sensitive     TRIMETH/SULFA <=20 SENSITIVE Sensitive     AMPICILLIN/SULBACTAM <=2 SENSITIVE Sensitive     PIP/TAZO <=4 SENSITIVE Sensitive     * >=100,000 COLONIES/mL ESCHERICHIA COLI   Enterococcus faecalis - MIC*    AMPICILLIN <=2 SENSITIVE Sensitive     NITROFURANTOIN <=16 SENSITIVE Sensitive     VANCOMYCIN 1 SENSITIVE Sensitive     * >=100,000 COLONIES/mL ENTEROCOCCUS FAECALIS  SARS CORONAVIRUS 2 (TAT 6-24 HRS) Nasopharyngeal Nasopharyngeal Swab     Status: None   Collection Time: 04/27/21  1:56 PM   Specimen: Nasopharyngeal Swab  Result Value Ref Range Status   SARS Coronavirus 2 NEGATIVE NEGATIVE Final    Comment: (NOTE) SARS-CoV-2 target nucleic acids are NOT DETECTED.  The SARS-CoV-2 RNA is  generally detectable in upper and lower respiratory specimens during the acute phase of infection. Negative results do not preclude SARS-CoV-2 infection, do not rule out co-infections with other pathogens, and should not be used as the sole basis for  treatment or other patient management decisions. Negative results must be combined with clinical observations, patient history, and epidemiological information. The expected result is Negative.  Fact Sheet for Patients: SugarRoll.be  Fact Sheet for Healthcare Providers: https://www.woods-mathews.com/  This test is not yet approved or cleared by the [place] FDA and  has been authorized for detection and/or diagnosis of SARS-CoV-2 by FDA under an Emergency Use Authorization (EUA). This EUA will remain  in effect (meaning this test can be used) for the duration of the COVID-19 declaration under Se ction 564(b)(1) of the Act, 21 U.S.C. section 360bbb-3(b)(1), unless the authorization is terminated or revoked sooner.  Performed at [facility], [place] [address] [place], [place] [address]      Labs: CBC: Recent Labs  Lab 04/23/21 0110 04/24/21 0041 04/25/21 0115 04/26/21 0111 04/26/21 1853 04/27/21 0212  WBC 8.5 10.0 10.7* 11.6* 11.2* 10.3  NEUTROABS 5.9  --  7.7 8.0*  --  6.5  HGB 10.5* 8.9* 7.9* 7.7* 7.5* 7.7*  HCT 30.7* 28.1* 23.9* 23.5* 23.4* 22.9*  MCV 94.2 97.9 94.5 94.4 95.9 94.6  PLT 296 287 255 310 339 0000000    Basic Metabolic Panel: Recent Labs  Lab 04/24/21 0041 04/25/21 0115 04/26/21 0111 04/26/21 1853 04/27/21 0212  NA 129* 127* 126* 124* 128*  K 3.9 3.5 3.2* 4.3 4.4  CL 98 91* 91* 90* 91*  CO2 '23 24 28 27 30  '$ GLUCOSE 100* 102* 148* 139* 119*  BUN 6* 5* 9 18 27*  CREATININE 0.62 0.73 0.63 0.66 0.60  CALCIUM 8.8* 8.8* 8.9 8.7* 9.4  MG 1.6*  --   --  1.7  --   PHOS 3.4  --   --   --   --     Liver Function Tests: No results for input(s): AST, ALT,  ALKPHOS, BILITOT, PROT, ALBUMIN in the last 168 hours.  CBG: No results for input(s): GLUCAP in the last 168 hours.  Time spent: 35 minutes  Signed:  Berle Mull  [facility]  04/29/2021

## 2021-04-29 NOTE — TOC Transition Note (Signed)
Transition of Care [facility]) - CM/SW Discharge Note   Patient Details  Name: Judy Davis MRN: [id number] Date of Birth: [birth year]  [facility] CM/SW Contact:  Emeterio Reeve, LCSW Phone Number: 04/29/2021, 1:34 PM   Clinical Narrative:     Patient will DC to: [facility] Anticipated DC date: 04/29/21 Family notified: Husband Transport by: Corey Harold     Per MD patient ready for DC to [facility]. RN, patient, patient's family, and facility notified of DC. Discharge Summary and FL2 sent to facility. DC packet on chart. Insurance Josem Kaufmann has been received and pt is covid negative. Ambulance transport requested for patient.    RN to call report to [phone]. Room B14  CSW will sign off for now as social work intervention is no longer needed. Please consult [place] again if new needs arise.  Final next level of care: Skilled Nursing Facility Barriers to Discharge: Barriers Resolved   Patient Goals and CMS Choice   CMS Medicare.gov Compare Post Acute Care list provided to:: Patient Choice offered to / list presented to : Patient  Discharge Placement              Patient chooses bed at: Other - please specify in the comment section below: [facility]) Patient to be transferred to facility by: [facility] Name of family member notified: Husband Patient and family notified of of transfer: 04/29/21  Discharge Plan and Services                                     Social Determinants of Health (SDOH) Interventions     Readmission Risk Interventions No flowsheet data found.   Emeterio Reeve, [place] Clinical Social Worker [phone]

## 2021-05-08 ENCOUNTER — Ambulatory Visit (INDEPENDENT_AMBULATORY_CARE_PROVIDER_SITE_OTHER): Payer: Medicare Other

## 2021-05-08 ENCOUNTER — Encounter: Payer: Self-pay | Admitting: Orthopedic Surgery

## 2021-05-08 ENCOUNTER — Other Ambulatory Visit: Payer: Self-pay

## 2021-05-08 ENCOUNTER — Ambulatory Visit (INDEPENDENT_AMBULATORY_CARE_PROVIDER_SITE_OTHER): Payer: Medicare Other | Admitting: Orthopedic Surgery

## 2021-05-08 DIAGNOSIS — Z96642 Presence of left artificial hip joint: Secondary | ICD-10-CM | POA: Diagnosis not present

## 2021-05-08 NOTE — Progress Notes (Signed)
Post-Op Visit Note   Patient: Judy Davis           Date of Birth: 06/13/1956           MRN: [id number] Visit Date: 05/08/2021 PCP: [facility], [place]:  Chief Complaint:  Chief Complaint  Patient presents with   Other    04/23/21 left THA   Visit Diagnoses:  1. S/P total left hip arthroplasty     Plan: We will is a 65 year old patient left hip replacement for hip fracture 2 weeks ago.  Doing well.  In therapy.  On exam no pain with range of motion of the left hip.  Hip flexion 5- out of 5 on the left compared to the right.  Incision intact.  Radiographs look good.  Plan is to continue weightbearing as tolerated.  She is in a nursing home.  She has a husband at home who can help her.  I think in general she is doing well and could be released to go back home once she meets safety criteria from nursing home therapy standpoint.  Follow-up with [place] as needed  Follow-Up Instructions: Return if symptoms worsen or fail to improve.   Orders:  Orders Placed This Encounter  Procedures   XR HIP UNILAT W OR W/O PELVIS 2-3 VIEWS LEFT   No orders of the defined types were placed in this encounter.   Imaging: XR HIP UNILAT W OR W/O PELVIS 2-3 VIEWS LEFT  Result Date: 05/08/2021 AP pelvis lateral left hip reviewed.  Total hip prosthesis in good position and alignment with no complicating features.  Leg lengths approximately equal.   PMFS History: Patient Active Problem List   Diagnosis Date Noted   Closed left hip fracture ([place]) 04/21/2021   Hypothyroidism    Hyperlipidemia    Chronic pancreatitis ([place]) 12/10/2020   Encounter for screening colonoscopy 12/10/2020   Elevated CA 19-9 level 07/18/2020   Acute kidney injury ([place])    Severe Vitamin D deficiency 02/28/2020   Malnutrition of moderate degree 02/28/2020   Generalized abdominal pain 02/27/2020   GERD (gastroesophageal reflux disease) 02/27/2020   Altered mental state 02/27/2020    Sinus tachycardia 02/27/2020   Acute on chronic pancreatitis ([place]) 02/27/2020   Pancreatic mass 02/27/2020   Pancreatic lesion 02/27/2020   Gait instability 08/10/2019   Alcohol abuse 08/08/2019   Intractable nausea and vomiting 08/08/2019   Intractable vomiting 08/07/2019   Hematoma 09/29/2016   Elevated AST (SGOT) 09/29/2016   Hyperglycemia 09/29/2016   Hypokalemia 09/29/2016   Upper respiratory infection 07/06/2011   Lower urinary tract infectious disease 07/06/2011   Nausea and vomiting 07/05/2011   Hyponatremia 07/05/2011   HTN (hypertension) 07/05/2011   Anemia 07/05/2011   Past Medical History:  Diagnosis Date   ETOH abuse    HTN (hypertension)    Hyperlipidemia    Hypothyroidism     Family History  Problem Relation Age of Onset   Other Mother        Homicide   Cancer Father        possibly pancreatic   Colon cancer Neg Hx    Colon polyps Neg Hx     Past Surgical History:  Procedure Laterality Date   ABDOMINAL HYSTERECTOMY  1970's   COLONOSCOPY WITH PROPOFOL N/A 02/05/2021   Procedure: COLONOSCOPY WITH PROPOFOL;  Surgeon: [name], DO;  [facility];  Service: Endoscopy;  Laterality: N/A;  AM   ESOPHAGOGASTRODUODENOSCOPY (EGD) WITH  PROPOFOL N/A 08/09/2019   Fields: LA Grade C esophagitis. gastritis. no h.pylori   TOTAL HIP ARTHROPLASTY Left 04/23/2021   Procedure: LEFT TOTAL HIP ARTHROPLASTY ANTERIOR APPROACH;  Surgeon: Meredith Pel, MD;  Location: [place];  Service: Orthopedics;  Laterality: Left;   Social History   Occupational History   Occupation: unemployed  Tobacco Use   Smoking status: Never   Smokeless tobacco: Never  Vaping Use   Vaping Use: Never used  Substance and Sexual Activity   Alcohol use: Yes    Comment: occ 1 beer/day   Drug use: No   Sexual activity: Not on file

## 2021-07-29 ENCOUNTER — Telehealth: Payer: Self-pay

## 2021-07-29 ENCOUNTER — Telehealth: Payer: Self-pay | Admitting: *Deleted

## 2021-07-29 ENCOUNTER — Encounter: Payer: Self-pay | Admitting: *Deleted

## 2021-07-29 ENCOUNTER — Ambulatory Visit (INDEPENDENT_AMBULATORY_CARE_PROVIDER_SITE_OTHER): Payer: Medicare Other | Admitting: Gastroenterology

## 2021-07-29 ENCOUNTER — Telehealth: Payer: Self-pay | Admitting: Gastroenterology

## 2021-07-29 ENCOUNTER — Encounter: Payer: Self-pay | Admitting: Gastroenterology

## 2021-07-29 ENCOUNTER — Other Ambulatory Visit: Payer: Self-pay

## 2021-07-29 VITALS — BP 184/98 | HR 99 | Temp 97.1°F | Ht 66.0 in | Wt 123.0 lb

## 2021-07-29 DIAGNOSIS — D649 Anemia, unspecified: Secondary | ICD-10-CM

## 2021-07-29 DIAGNOSIS — Z1211 Encounter for screening for malignant neoplasm of colon: Secondary | ICD-10-CM | POA: Diagnosis not present

## 2021-07-29 MED ORDER — PEG 3350-KCL-NA BICARB-NACL 420 G PO SOLR: ORAL | 0 refills | Status: DC

## 2021-07-29 NOTE — Progress Notes (Signed)
Called pt to inform of pre-op appt, VM not set up.

## 2021-07-29 NOTE — Telephone Encounter (Signed)
Pharmacy LMOVM regarding was the Rx for 2 jugs of Nulytely right or not. They left this number [phone]

## 2021-07-29 NOTE — Patient Instructions (Signed)
Please have labs done at [facility] lab. Colonoscopy to be scheduled. See separate instructions.  Samples of Linzess provided today to be used as part of your bowel prep.

## 2021-07-29 NOTE — Telephone Encounter (Signed)
Yes, see instructions. Patient having to do 1.5 preps.  Called [facility] and spoke with pharmacists. Advised this was correct.

## 2021-07-29 NOTE — Telephone Encounter (Signed)
PA approved via [facility] website. Auth# [id number], DOS Aug 19, 2021 - Sep 14, 2021

## 2021-07-29 NOTE — H&P (View-Only) (Signed)
Primary Care Physician: [facility]  Primary Gastroenterologist:  Elon Alas. Abbey Chatters, DO   Chief Complaint  Patient presents with   Colonoscopy    Poor prep. Drank all of prep and followed instructions    HPI: Judy Davis is a 65 y.o. female here to consider rescheduling colonoscopy due to poor bowel prep.  Since her last office visit she suffered left femoral neck fracture requiring hospitalization in August 2022.  She underwent left total hip replacement.  She had postoperative acute blood loss anemia with baseline hemoglobin around 10 but postoperatively hemoglobin of 7.  Today: Feels well.  States she is fully recovered from her hip surgery.  Drinks about one 12 ounce beer every 1 to 2 weeks.  Takes pain medication about once per week. BM about every other day. No melena, brbpr.  No heartburn, dysphagia, vomiting.  States she took her prep appropriately and follow clear liquid diet as prescribed for colonoscopy in May 2022.  She is not sure why she was not adequately prepped.  Attempted colonoscopy May 2022: Preparation of the colon was inadequate. - Non-bleeding internal hemorrhoids. - Diverticulosis in the sigmoid colon and in the descending colon. - Stool in the descending colon and in the transverse colon. - No specimens collected.     Current Outpatient Medications  Medication Sig Dispense Refill   feeding supplement (ENSURE ENLIVE / ENSURE PLUS) LIQD Take 237 mLs by mouth 3 (three) times daily between meals. (Patient taking differently: Take 237 mLs by mouth 2 (two) times daily between meals.) 10000 mL 0   ferrous sulfate 325 (65 FE) MG tablet Take 1 tablet (325 mg total) by mouth 3 (three) times daily with meals. 30 tablet 3   HYDROcodone-acetaminophen (NORCO/VICODIN) 5-325 MG tablet Take 1 tablet by mouth every 6 (six) hours as needed for moderate pain (pain score 4-6). 20 tablet 0   levothyroxine (SYNTHROID) 50 MCG tablet Take 50 mcg  by mouth daily before breakfast.     metoprolol tartrate (LOPRESSOR) 50 MG tablet Take 50 mg by mouth 2 (two) times daily.     Multiple Vitamin (MULTIVITAMIN WITH MINERALS) TABS tablet Take 1 tablet by mouth daily. 30 tablet 0   polyethylene glycol (MIRALAX / GLYCOLAX) 17 g packet Take 17 g by mouth daily. (Patient taking differently: Take 17 g by mouth daily as needed.) 14 each 0   Vitamin D, Ergocalciferol, (DRISDOL) 1.25 MG (50000 UNIT) CAPS capsule Take 1 capsule (50,000 Units total) by mouth every 7 (seven) days. 5 capsule 0   polyethylene glycol-electrolytes (NULYTELY) 420 g solution As directed 8000 mL 0   No current facility-administered medications for this visit.    Allergies as of 07/29/2021   (No Known Allergies)   Past Medical History:  Diagnosis Date   ETOH abuse    HTN (hypertension)    Hyperlipidemia    Hypothyroidism    Past Surgical History:  Procedure Laterality Date   ABDOMINAL HYSTERECTOMY  1970's   COLONOSCOPY WITH PROPOFOL N/A 02/05/2021   Procedure: COLONOSCOPY WITH PROPOFOL;  Surgeon: [name], DO;  [facility];  Service: Endoscopy;  Laterality: N/A;  AM   ESOPHAGOGASTRODUODENOSCOPY (EGD) WITH PROPOFOL N/A 08/09/2019   Fields: LA Grade C esophagitis. gastritis. no h.pylori   TOTAL HIP ARTHROPLASTY Left 04/23/2021   Procedure: LEFT TOTAL HIP ARTHROPLASTY ANTERIOR APPROACH;  Surgeon: Meredith Pel, MD;  Location: [place];  Service: Orthopedics;  Laterality: Left;   Family History  Problem Relation Age of Onset   Other Mother        Homicide   Cancer Father        possibly pancreatic   Colon cancer Neg Hx    Colon polyps Neg Hx    Social History   Tobacco Use   Smoking status: Never   Smokeless tobacco: Never  Vaping Use   Vaping Use: Never used  Substance Use Topics   Alcohol use: Yes    Comment: occ beer   Drug use: No    ROS:  General: Negative for anorexia, weight loss, fever, chills, fatigue, weakness. ENT:  Negative for hoarseness, difficulty swallowing , nasal congestion. CV: Negative for chest pain, angina, palpitations, dyspnea on exertion, peripheral edema.  Respiratory: Negative for dyspnea at rest, dyspnea on exertion, cough, sputum, wheezing.  GI: See history of present illness. GU:  Negative for dysuria, hematuria, urinary incontinence, urinary frequency, nocturnal urination.  Endo: Negative for unusual weight change.    Physical Examination:   BP (!) 184/98   Pulse 99   Temp (!) 97.1 F (36.2 C) (Temporal)   Ht 5\' 6"  (1.676 m)   Wt 123 lb (55.8 kg)   BMI 19.85 kg/m   General: Well-nourished, well-developed in no acute distress.  Eyes: No icterus. Mouth:masked Lungs: Clear to auscultation bilaterally.  Heart: Regular rate and rhythm, no murmurs rubs or gallops.  Abdomen: Bowel sounds are normal, nontender, nondistended, no hepatosplenomegaly or masses, no abdominal bruits or hernia , no rebound or guarding.   Extremities: No lower extremity edema. No clubbing or deformities. Neuro: Alert and oriented x 4   Skin: Warm and dry, no jaundice.   Psych: Alert and cooperative, normal mood and affect.  Labs:  Lab Results  Component Value Date   CREATININE 0.60 04/27/2021   BUN 27 (H) 04/27/2021   NA 128 (L) 04/27/2021   K 4.4 04/27/2021   CL 91 (L) 04/27/2021   CO2 30 04/27/2021   Lab Results  Component Value Date   ALT 11 04/22/2021   AST 18 04/22/2021   ALKPHOS 50 04/22/2021   BILITOT 1.1 04/22/2021   Lab Results  Component Value Date   WBC 10.3 04/27/2021   HGB 7.7 (L) 04/27/2021   HCT 22.9 (L) 04/27/2021   MCV 94.6 04/27/2021   PLT 370 04/27/2021   Lab Results  Component Value Date   LIPASE 24 12/26/2020   Lab Results  Component Value Date   IRON 33 03/01/2020   TIBC 160 (L) 03/01/2020   FERRITIN 435 (H) 03/01/2020   Lab Results  Component Value Date   VITAMINB12 315 03/01/2020   Lab Results  Component Value Date   FOLATE 9.6 03/01/2020      Imaging Studies: No results found.   Assessment:  H/o anemia dating back 3 years.  Recent decline in hemoglobin postoperatively.  We will update labs.  Check for iron deficiency as well but none present last year.    She is overdue for screening colonoscopy, recent attempt unsuccessful due to poor bowel prep.  Plan for 2-day bowel prep.     Plan: CBC, iron/TIBC/ferritin. Colonoscopy with Dr. Abbey Chatters. ASA III.  I have discussed the risks, alternatives, benefits with regards to but not limited to the risk of reaction to medication, bleeding, infection, perforation and the patient is agreeable to proceed. Written consent to be obtained.  2-day bowel prep planned. Will also use Linzess few days preceding bowel prep.

## 2021-07-29 NOTE — Progress Notes (Signed)
Primary Care Physician: [facility]  Primary Gastroenterologist:  Elon Alas. Abbey Chatters, DO   Chief Complaint  Patient presents with   Colonoscopy    Poor prep. Drank all of prep and followed instructions    HPI: Judy Davis is a 65 y.o. female here to consider rescheduling colonoscopy due to poor bowel prep.  Since her last office visit she suffered left femoral neck fracture requiring hospitalization in August 2022.  She underwent left total hip replacement.  She had postoperative acute blood loss anemia with baseline hemoglobin around 10 but postoperatively hemoglobin of 7.  Today: Feels well.  States she is fully recovered from her hip surgery.  Drinks about one 12 ounce beer every 1 to 2 weeks.  Takes pain medication about once per week. BM about every other day. No melena, brbpr.  No heartburn, dysphagia, vomiting.  States she took her prep appropriately and follow clear liquid diet as prescribed for colonoscopy in May 2022.  She is not sure why she was not adequately prepped.  Attempted colonoscopy May 2022: Preparation of the colon was inadequate. - Non-bleeding internal hemorrhoids. - Diverticulosis in the sigmoid colon and in the descending colon. - Stool in the descending colon and in the transverse colon. - No specimens collected.     Current Outpatient Medications  Medication Sig Dispense Refill   feeding supplement (ENSURE ENLIVE / ENSURE PLUS) LIQD Take 237 mLs by mouth 3 (three) times daily between meals. (Patient taking differently: Take 237 mLs by mouth 2 (two) times daily between meals.) 10000 mL 0   ferrous sulfate 325 (65 FE) MG tablet Take 1 tablet (325 mg total) by mouth 3 (three) times daily with meals. 30 tablet 3   HYDROcodone-acetaminophen (NORCO/VICODIN) 5-325 MG tablet Take 1 tablet by mouth every 6 (six) hours as needed for moderate pain (pain score 4-6). 20 tablet 0   levothyroxine (SYNTHROID) 50 MCG tablet Take 50 mcg  by mouth daily before breakfast.     metoprolol tartrate (LOPRESSOR) 50 MG tablet Take 50 mg by mouth 2 (two) times daily.     Multiple Vitamin (MULTIVITAMIN WITH MINERALS) TABS tablet Take 1 tablet by mouth daily. 30 tablet 0   polyethylene glycol (MIRALAX / GLYCOLAX) 17 g packet Take 17 g by mouth daily. (Patient taking differently: Take 17 g by mouth daily as needed.) 14 each 0   Vitamin D, Ergocalciferol, (DRISDOL) 1.25 MG (50000 UNIT) CAPS capsule Take 1 capsule (50,000 Units total) by mouth every 7 (seven) days. 5 capsule 0   polyethylene glycol-electrolytes (NULYTELY) 420 g solution As directed 8000 mL 0   No current facility-administered medications for this visit.    Allergies as of 07/29/2021   (No Known Allergies)   Past Medical History:  Diagnosis Date   ETOH abuse    HTN (hypertension)    Hyperlipidemia    Hypothyroidism    Past Surgical History:  Procedure Laterality Date   ABDOMINAL HYSTERECTOMY  1970's   COLONOSCOPY WITH PROPOFOL N/A 02/05/2021   Procedure: COLONOSCOPY WITH PROPOFOL;  Surgeon: [name], DO;  [facility];  Service: Endoscopy;  Laterality: N/A;  AM   ESOPHAGOGASTRODUODENOSCOPY (EGD) WITH PROPOFOL N/A 08/09/2019   Fields: LA Grade C esophagitis. gastritis. no h.pylori   TOTAL HIP ARTHROPLASTY Left 04/23/2021   Procedure: LEFT TOTAL HIP ARTHROPLASTY ANTERIOR APPROACH;  Surgeon: Meredith Pel, MD;  Location: [place];  Service: Orthopedics;  Laterality: Left;   Family History  Problem Relation Age of Onset   Other Mother        Homicide   Cancer Father        possibly pancreatic   Colon cancer Neg Hx    Colon polyps Neg Hx    Social History   Tobacco Use   Smoking status: Never   Smokeless tobacco: Never  Vaping Use   Vaping Use: Never used  Substance Use Topics   Alcohol use: Yes    Comment: occ beer   Drug use: No    ROS:  General: Negative for anorexia, weight loss, fever, chills, fatigue, weakness. ENT:  Negative for hoarseness, difficulty swallowing , nasal congestion. CV: Negative for chest pain, angina, palpitations, dyspnea on exertion, peripheral edema.  Respiratory: Negative for dyspnea at rest, dyspnea on exertion, cough, sputum, wheezing.  GI: See history of present illness. GU:  Negative for dysuria, hematuria, urinary incontinence, urinary frequency, nocturnal urination.  Endo: Negative for unusual weight change.    Physical Examination:   BP (!) 184/98   Pulse 99   Temp (!) 97.1 F (36.2 C) (Temporal)   Ht 5\' 6"  (1.676 m)   Wt 123 lb (55.8 kg)   BMI 19.85 kg/m   General: Well-nourished, well-developed in no acute distress.  Eyes: No icterus. Mouth:masked Lungs: Clear to auscultation bilaterally.  Heart: Regular rate and rhythm, no murmurs rubs or gallops.  Abdomen: Bowel sounds are normal, nontender, nondistended, no hepatosplenomegaly or masses, no abdominal bruits or hernia , no rebound or guarding.   Extremities: No lower extremity edema. No clubbing or deformities. Neuro: Alert and oriented x 4   Skin: Warm and dry, no jaundice.   Psych: Alert and cooperative, normal mood and affect.  Labs:  Lab Results  Component Value Date   CREATININE 0.60 04/27/2021   BUN 27 (H) 04/27/2021   NA 128 (L) 04/27/2021   K 4.4 04/27/2021   CL 91 (L) 04/27/2021   CO2 30 04/27/2021   Lab Results  Component Value Date   ALT 11 04/22/2021   AST 18 04/22/2021   ALKPHOS 50 04/22/2021   BILITOT 1.1 04/22/2021   Lab Results  Component Value Date   WBC 10.3 04/27/2021   HGB 7.7 (L) 04/27/2021   HCT 22.9 (L) 04/27/2021   MCV 94.6 04/27/2021   PLT 370 04/27/2021   Lab Results  Component Value Date   LIPASE 24 12/26/2020   Lab Results  Component Value Date   IRON 33 03/01/2020   TIBC 160 (L) 03/01/2020   FERRITIN 435 (H) 03/01/2020   Lab Results  Component Value Date   VITAMINB12 315 03/01/2020   Lab Results  Component Value Date   FOLATE 9.6 03/01/2020      Imaging Studies: No results found.   Assessment:  H/o anemia dating back 3 years.  Recent decline in hemoglobin postoperatively.  We will update labs.  Check for iron deficiency as well but none present last year.    She is overdue for screening colonoscopy, recent attempt unsuccessful due to poor bowel prep.  Plan for 2-day bowel prep.     Plan: CBC, iron/TIBC/ferritin. Colonoscopy with Dr. Abbey Chatters. ASA III.  I have discussed the risks, alternatives, benefits with regards to but not limited to the risk of reaction to medication, bleeding, infection, perforation and the patient is agreeable to proceed. Written consent to be obtained.  2-day bowel prep planned. Will also use Linzess few days preceding bowel prep.

## 2021-07-29 NOTE — Telephone Encounter (Signed)
Opened in error

## 2021-08-02 LAB — IRON,TIBC AND FERRITIN PANEL
%SAT: 21 % (calc) (ref 16–45)
Ferritin: 130 ng/mL (ref 16–288)
Iron: 67 ug/dL (ref 45–160)
TIBC: 316 mcg/dL (calc) (ref 250–450)

## 2021-08-02 LAB — CBC WITH DIFFERENTIAL/PLATELET
Absolute Monocytes: 467 cells/uL (ref 200–950)
Basophils Absolute: 51 cells/uL (ref 0–200)
Basophils Relative: 0.9 %
Eosinophils Absolute: 108 cells/uL (ref 15–500)
Eosinophils Relative: 1.9 %
HCT: 33.5 % — ABNORMAL LOW (ref 35.0–45.0)
Hemoglobin: 10.9 g/dL — ABNORMAL LOW (ref 11.7–15.5)
Lymphs Abs: 2149 cells/uL (ref 850–3900)
MCH: 28.4 pg (ref 27.0–33.0)
MCHC: 32.5 g/dL (ref 32.0–36.0)
MCV: 87.2 fL (ref 80.0–100.0)
MPV: 10.8 fL (ref 7.5–12.5)
Monocytes Relative: 8.2 %
Neutro Abs: 2924 cells/uL (ref 1500–7800)
Neutrophils Relative %: 51.3 %
Platelets: 277 10*3/uL (ref 140–400)
RBC: 3.84 10*6/uL (ref 3.80–5.10)
RDW: 13.8 % (ref 11.0–15.0)
Total Lymphocyte: 37.7 %
WBC: 5.7 10*3/uL (ref 3.8–10.8)

## 2021-08-15 ENCOUNTER — Other Ambulatory Visit: Payer: Self-pay

## 2021-08-15 ENCOUNTER — Encounter (HOSPITAL_COMMUNITY)
Admission: RE | Admit: 2021-08-15 | Discharge: 2021-08-15 | Disposition: A | Discharge status: Home / Self Care | Payer: Medicare Other | Source: Ambulatory Visit | Attending: Internal Medicine | Admitting: Internal Medicine

## 2021-08-15 ENCOUNTER — Encounter (HOSPITAL_COMMUNITY): Payer: Self-pay

## 2021-08-15 NOTE — Pre-Procedure Instructions (Signed)
Attempted to call for preop phonecall and it has voicemail that has not been set up.

## 2021-08-15 NOTE — Patient Instructions (Signed)
Judy Davis  08/15/2021     @PREFPERIOPPHARMACY @   Your procedure is scheduled on  08/19/2021.   Report to [facility] at  0700  A.M.   Call this number if you have problems the morning of surgery:  [phone]   Remember:  Follow the diet and prep instructions given to you by the office.    Take these medicines the morning of surgery with A SIP OF WATER                                 None     Do not wear jewelry, make-up or nail polish.  Do not wear lotions, powders, or perfumes, or deodorant.  Do not shave 48 hours prior to surgery.  Men may shave face and neck.  Do not bring valuables to the hospital.  [facility] is not responsible for any belongings or valuables.  Contacts, dentures or bridgework may not be worn into surgery.  Leave your suitcase in the car.  After surgery it may be brought to your room.  For patients admitted to the hospital, discharge time will be determined by your treatment team.  Patients discharged the day of surgery will not be allowed to drive home and must have someone with them for 24 hours.    Special instructions:   DO NOT smoke tobacco or vape for 24 hors before your procedure.  Please read over the following fact sheets that you were given. Anesthesia Post-op Instructions and Care and Recovery After Surgery      Colonoscopy, Adult, Care After This sheet gives you information about how to care for yourself after your procedure. Your health care provider may also give you more specific instructions. If you have problems or questions, contact your health care provider. What can I expect after the procedure? After the procedure, it is common to have: A small amount of blood in your stool for 24 hours after the procedure. Some gas. Mild cramping or bloating of your abdomen. Follow these instructions at home: Eating and drinking  Drink enough fluid to keep your urine pale yellow. Follow instructions from your health  care provider about eating or drinking restrictions. Resume your normal diet as instructed by your health care provider. Avoid heavy or fried foods that are hard to digest. Activity Rest as told by your health care provider. Avoid sitting for a long time without moving. Get up to take short walks every 1-2 hours. This is important to improve blood flow and breathing. Ask for help if you feel weak or unsteady. Return to your normal activities as told by your health care provider. Ask your health care provider what activities are safe for you. Managing cramping and bloating  Try walking around when you have cramps or feel bloated. Apply heat to your abdomen as told by your health care provider. Use the heat source that your health care provider recommends, such as a moist heat pack or a heating pad. Place a towel between your skin and the heat source. Leave the heat on for 20-30 minutes. Remove the heat if your skin turns bright red. This is especially important if you are unable to feel pain, heat, or cold. You may have a greater risk of getting burned. General instructions If you were given a sedative during the procedure, it can affect you for several hours. Do not drive or operate  machinery until your health care provider says that it is safe. For the first 24 hours after the procedure: Do not sign important documents. Do not drink alcohol. Do your regular daily activities at a slower pace than normal. Eat soft foods that are easy to digest. Take over-the-counter and prescription medicines only as told by your health care provider. Keep all follow-up visits as told by your health care provider. This is important. Contact a health care provider if: You have blood in your stool 2-3 days after the procedure. Get help right away if you have: More than a small spotting of blood in your stool. Large blood clots in your stool. Swelling of your abdomen. Nausea or vomiting. A fever. Increasing  pain in your abdomen that is not relieved with medicine. Summary After the procedure, it is common to have a small amount of blood in your stool. You may also have mild cramping and bloating of your abdomen. If you were given a sedative during the procedure, it can affect you for several hours. Do not drive or operate machinery until your health care provider says that it is safe. Get help right away if you have a lot of blood in your stool, nausea or vomiting, a fever, or increased pain in your abdomen. This information is not intended to replace advice given to you by your health care provider. Make sure you discuss any questions you have with your health care provider. Document Revised: 07/08/2019 Document Reviewed: 03/28/2019 Elsevier Patient Education  [place] After This sheet gives you information about how to care for yourself after your procedure. Your health care provider may also give you more specific instructions. If you have problems or questions, contact your health care provider. What can I expect after the procedure? After the procedure, it is common to have: Tiredness. Forgetfulness about what happened after the procedure. Impaired judgment for important decisions. Nausea or vomiting. Some difficulty with balance. Follow these instructions at home: For the time period you were told by your health care provider:   Rest as needed. Do not participate in activities where you could fall or become injured. Do not drive or use machinery. Do not drink alcohol. Do not take sleeping pills or medicines that cause drowsiness. Do not make important decisions or sign legal documents. Do not take care of children on your own. Eating and drinking Follow the diet that is recommended by your health care provider. Drink enough fluid to keep your urine pale yellow. If you vomit: Drink water, juice, or soup when you can drink without  vomiting. Make sure you have little or no nausea before eating solid foods. General instructions Have a responsible adult stay with you for the time you are told. It is important to have someone help care for you until you are awake and alert. Take over-the-counter and prescription medicines only as told by your health care provider. If you have sleep apnea, surgery and certain medicines can increase your risk for breathing problems. Follow instructions from your health care provider about wearing your sleep device: Anytime you are sleeping, including during daytime naps. While taking prescription pain medicines, sleeping medicines, or medicines that make you drowsy. Avoid smoking. Keep all follow-up visits as told by your health care provider. This is important. Contact a health care provider if: You keep feeling nauseous or you keep vomiting. You feel light-headed. You are still sleepy or having trouble with balance after 24 hours. You develop  a rash. You have a fever. You have redness or swelling around the IV site. Get help right away if: You have trouble breathing. You have new-onset confusion at home. Summary For several hours after your procedure, you may feel tired. You may also be forgetful and have poor judgment. Have a responsible adult stay with you for the time you are told. It is important to have someone help care for you until you are awake and alert. Rest as told. Do not drive or operate machinery. Do not drink alcohol or take sleeping pills. Get help right away if you have trouble breathing, or if you suddenly become confused. This information is not intended to replace advice given to you by your health care provider. Make sure you discuss any questions you have with your health care provider. Document Revised: 05/17/2020 Document Reviewed: 08/04/2019 Elsevier Patient Education  2022 [employer].

## 2021-08-19 ENCOUNTER — Encounter (HOSPITAL_COMMUNITY): Payer: Self-pay

## 2021-08-19 ENCOUNTER — Ambulatory Visit (HOSPITAL_COMMUNITY): Payer: Medicare Other | Admitting: Anesthesiology

## 2021-08-19 ENCOUNTER — Encounter (HOSPITAL_COMMUNITY)
Admission: RE | Disposition: A | Discharge status: Home / Self Care | Payer: Self-pay | Source: Home / Self Care | Attending: Internal Medicine

## 2021-08-19 ENCOUNTER — Other Ambulatory Visit: Payer: Self-pay

## 2021-08-19 ENCOUNTER — Ambulatory Visit (HOSPITAL_COMMUNITY)
Admission: RE | Admit: 2021-08-19 | Discharge: 2021-08-19 | Disposition: A | Discharge status: Home / Self Care | Payer: Medicare Other | Attending: Internal Medicine | Admitting: Internal Medicine

## 2021-08-19 DIAGNOSIS — Z1211 Encounter for screening for malignant neoplasm of colon: Secondary | ICD-10-CM

## 2021-08-19 DIAGNOSIS — D124 Benign neoplasm of descending colon: Secondary | ICD-10-CM | POA: Insufficient documentation

## 2021-08-19 DIAGNOSIS — E039 Hypothyroidism, unspecified: Secondary | ICD-10-CM | POA: Diagnosis not present

## 2021-08-19 DIAGNOSIS — K648 Other hemorrhoids: Secondary | ICD-10-CM | POA: Insufficient documentation

## 2021-08-19 DIAGNOSIS — Z96642 Presence of left artificial hip joint: Secondary | ICD-10-CM | POA: Insufficient documentation

## 2021-08-19 DIAGNOSIS — G709 Myoneural disorder, unspecified: Secondary | ICD-10-CM | POA: Insufficient documentation

## 2021-08-19 DIAGNOSIS — D649 Anemia, unspecified: Secondary | ICD-10-CM | POA: Insufficient documentation

## 2021-08-19 DIAGNOSIS — K635 Polyp of colon: Secondary | ICD-10-CM

## 2021-08-19 DIAGNOSIS — I10 Essential (primary) hypertension: Secondary | ICD-10-CM | POA: Diagnosis not present

## 2021-08-19 DIAGNOSIS — K5730 Diverticulosis of large intestine without perforation or abscess without bleeding: Secondary | ICD-10-CM | POA: Diagnosis not present

## 2021-08-19 HISTORY — PX: COLONOSCOPY WITH PROPOFOL: SHX5780

## 2021-08-19 HISTORY — PX: POLYPECTOMY: SHX5525

## 2021-08-19 SURGERY — COLONOSCOPY WITH PROPOFOL
Anesthesia: General

## 2021-08-19 MED ORDER — LACTATED RINGERS IV SOLN: INTRAVENOUS | Status: DC

## 2021-08-19 MED ORDER — LIDOCAINE HCL (CARDIAC) PF 100 MG/5ML IV SOSY
PREFILLED_SYRINGE | INTRAVENOUS | Status: DC | PRN
  Administered 2021-08-19: 50 mg via INTRAVENOUS

## 2021-08-19 MED ORDER — PROPOFOL 10 MG/ML IV BOLUS
INTRAVENOUS | Status: DC | PRN
  Administered 2021-08-19 (×2): 30 mg via INTRAVENOUS
  Administered 2021-08-19: 100 mg via INTRAVENOUS
  Administered 2021-08-19: 30 mg via INTRAVENOUS
  Administered 2021-08-19: 20 mg via INTRAVENOUS
  Administered 2021-08-19: 50 mg via INTRAVENOUS
  Administered 2021-08-19: 30 mg via INTRAVENOUS

## 2021-08-19 NOTE — Discharge Instructions (Addendum)
  Colonoscopy Discharge Instructions  Read the instructions outlined below and refer to this sheet in the next few weeks. These discharge instructions provide you with general information on caring for yourself after you leave the hospital. Your doctor may also give you specific instructions. While your treatment has been planned according to the most current medical practices available, unavoidable complications occasionally occur.   ACTIVITY You may resume your regular activity, but move at a slower pace for the next 24 hours.  Take frequent rest periods for the next 24 hours.  Walking will help get rid of the air and reduce the bloated feeling in your belly (abdomen).  No driving for 24 hours (because of the medicine (anesthesia) used during the test).   Do not sign any important legal documents or operate any machinery for 24 hours (because of the anesthesia used during the test).  NUTRITION Drink plenty of fluids.  You may resume your normal diet as instructed by your doctor.  Begin with a light meal and progress to your normal diet. Heavy or fried foods are harder to digest and may make you feel sick to your stomach (nauseated).  Avoid alcoholic beverages for 24 hours or as instructed.  MEDICATIONS You may resume your normal medications unless your doctor tells you otherwise.  WHAT YOU CAN EXPECT TODAY Some feelings of bloating in the abdomen.  Passage of more gas than usual.  Spotting of blood in your stool or on the toilet paper.  IF YOU HAD POLYPS REMOVED DURING THE COLONOSCOPY: No aspirin products for 7 days or as instructed.  No alcohol for 7 days or as instructed.  Eat a soft diet for the next 24 hours.  FINDING OUT THE RESULTS OF YOUR TEST Not all test results are available during your visit. If your test results are not back during the visit, make an appointment with your caregiver to find out the results. Do not assume everything is normal if you have not heard from your  caregiver or the medical facility. It is important for you to follow up on all of your test results.  SEEK IMMEDIATE MEDICAL ATTENTION IF: You have more than a spotting of blood in your stool.  Your belly is swollen (abdominal distention).  You are nauseated or vomiting.  You have a temperature over 101.  You have abdominal pain or discomfort that is severe or gets worse throughout the day.   Your colonoscopy revealed 1 polyp(s) which I removed successfully. Await pathology results, my office will contact you. I recommend repeating colonoscopy in 5 years for surveillance purposes. You also have diverticulosis and internal hemorrhoids. I would recommend increasing fiber in your diet or adding OTC Benefiber/Metamucil. Be sure to drink at least 4 to 6 glasses of water daily. Follow-up with GI in 6 months.     I hope you have a great rest of your week!  Elon Alas. Abbey Chatters, D.O. Gastroenterology and Hepatology [facility]

## 2021-08-19 NOTE — Op Note (Signed)
[facility] Patient Name: Judy Davis Procedure Date: 08/19/2021 7:22 AM MRN: [id number] Date of Birth: May 02, 1956 Attending MD: Elon Alas. Abbey Chatters DO CSN: [id number] Age: 65 Admit Type: Outpatient Procedure:                Colonoscopy Indications:              Screening for colorectal malignant neoplasm Providers:                Elon Alas. Abbey Chatters, DO, Charlsie Quest. Theda Sers RN, RN,                            Nelma Rothman, Technician Referring MD:              Medicines:                See the Anesthesia note for documentation of the                            administered medications Complications:            No immediate complications. Estimated Blood Loss:     Estimated blood loss was minimal. Procedure:                Pre-Anesthesia Assessment:                           - The anesthesia plan was to use monitored                            anesthesia care (MAC).                           After obtaining informed consent, the colonoscope                            was passed under direct vision. Throughout the                            procedure, the patient's blood pressure, pulse, and                            oxygen saturations were monitored continuously. The                            [id number]-HQ190L ([id number]) scope was introduced through                            the anus and advanced to the the cecum, identified                            by appendiceal orifice and ileocecal valve. The                            colonoscopy was performed without difficulty. The                            patient tolerated the procedure  well. The quality                            of the bowel preparation was evaluated using the                            BBPS [facility] Bowel Preparation Scale) with scores                            of: Right Colon = 2 (minor amount of residual                            staining, small fragments of stool and/or opaque                            liquid, but  mucosa seen well), Transverse Colon = 2                            (minor amount of residual staining, small fragments                            of stool and/or opaque liquid, but mucosa seen                            well) and Left Colon = 2 (minor amount of residual                            staining, small fragments of stool and/or opaque                            liquid, but mucosa seen well). The total BBPS score                            equals 6. The quality of the bowel preparation was                            fair. Scope In: 7:44:33 AM Scope Out: 8:07:05 AM Scope Withdrawal Time: 0 hours 14 minutes 0 seconds  Total Procedure Duration: 0 hours 22 minutes 32 seconds  Findings:      The perianal and digital rectal examinations were normal.      Non-bleeding internal hemorrhoids were found during endoscopy.      Multiple medium-mouthed diverticula were found in the sigmoid colon.      A 10 mm polyp was found in the descending colon. The polyp was       pedunculated. The polyp was removed with a cold snare. Resection and       retrieval were complete.      The exam was otherwise without abnormality. Impression:               - Preparation of the colon was fair.                           - Non-bleeding internal hemorrhoids.                           -  Diverticulosis in the sigmoid colon.                           - One 10 mm polyp in the descending colon, removed                            with a cold snare. Resected and retrieved.                           - The examination was otherwise normal. Moderate Sedation:      Per Anesthesia Care Recommendation:           - Patient has a contact number available for                            emergencies. The signs and symptoms of potential                            delayed complications were discussed with the                            patient. Return to normal activities tomorrow.                            Written discharge  instructions were provided to the                            patient.                           - Resume previous diet.                           - Continue present medications.                           - Await pathology results.                           - Repeat colonoscopy in 5 years for surveillance.                           - Return to GI clinic in 6 months. Procedure Code(s):        --- Professional ---                           [id number], Colonoscopy, flexible; with removal of                            tumor(s), polyp(s), or other lesion(s) by snare                            technique Diagnosis Code(s):        --- Professional ---  Z12.11, Encounter for screening for malignant                            neoplasm of colon                           K64.8, Other hemorrhoids                           K63.5, Polyp of colon                           K57.30, Diverticulosis of large intestine without                            perforation or abscess without bleeding CPT copyright 2019 American Medical Association. All rights reserved. The codes documented in this report are preliminary and upon coder review may  be revised to meet current compliance requirements. Elon Alas. Abbey Chatters, DO [place] Abbey Chatters, DO 08/19/2021 8:14:38 AM This report has been signed electronically. Number of Addenda: 0

## 2021-08-19 NOTE — Interval H&P Note (Signed)
History and Physical Interval Note:  08/19/2021 7:33 AM  Judy Davis  has presented today for surgery, with the diagnosis of screening.  The various methods of treatment have been discussed with the patient and family. After consideration of risks, benefits and other options for treatment, the patient has consented to  Procedure(s) with comments: COLONOSCOPY WITH PROPOFOL (N/A) - 9:00am as a surgical intervention.  The patient's history has been reviewed, patient examined, no change in status, stable for surgery.  I have reviewed the patient's chart and labs.  Questions were answered to the patient's satisfaction.     [name]

## 2021-08-19 NOTE — Transfer of Care (Signed)
Immediate Anesthesia Transfer of Care Note  Patient: Judy Davis  Procedure(s) Performed: COLONOSCOPY WITH PROPOFOL POLYPECTOMY  Patient Location: Short Stay  Anesthesia Type:General  Level of Consciousness: awake and alert   Airway & Oxygen Therapy: Patient Spontanous Breathing  Post-op Assessment: Report given to RN and Post -op Vital signs reviewed and stable  Post vital signs: Reviewed and stable  Last Vitals:  Vitals Value Taken Time  BP    Temp    Pulse    Resp    SpO2      Last Pain:  Vitals:   08/19/21 0744  TempSrc:   PainSc: 0-No pain         Complications: No notable events documented.

## 2021-08-19 NOTE — Anesthesia Postprocedure Evaluation (Signed)
Anesthesia Post Note  Patient: Judy Davis  Procedure(s) Performed: COLONOSCOPY WITH PROPOFOL POLYPECTOMY  Patient location during evaluation: Phase II Anesthesia Type: General Level of consciousness: awake and alert and oriented Pain management: pain level controlled Vital Signs Assessment: post-procedure vital signs reviewed and stable Respiratory status: spontaneous breathing, nonlabored ventilation and respiratory function stable Cardiovascular status: blood pressure returned to baseline and stable Postop Assessment: no apparent nausea or vomiting Anesthetic complications: no   No notable events documented.   Last Vitals:  Vitals:   08/19/21 0640 08/19/21 0811  BP: (!) 193/90 (!) 141/88  Pulse: 76 77  Resp: 16 14  Temp: 36.7 C 36.7 C  SpO2: 100% 100%    Last Pain:  Vitals:   08/19/21 0811  TempSrc: Axillary  PainSc: 0-No pain                 Jaser Fullen C Mahasin Riviere

## 2021-08-19 NOTE — Anesthesia Preprocedure Evaluation (Addendum)
Anesthesia Evaluation  Patient identified by MRN, date of birth, ID band Patient awake    Reviewed: Allergy & Precautions, NPO status , Patient's Chart, lab work & pertinent test results, reviewed documented beta blocker date and time   History of Anesthesia Complications Negative for: history of anesthetic complications  Airway Mallampati: II  TM Distance: >3 FB Neck ROM: Full    Dental  (+) Dental Advisory Given, Missing, Loose, Poor Dentition,    Pulmonary    Pulmonary exam normal breath sounds clear to auscultation       Cardiovascular Exercise Tolerance: Good hypertension ( not taking beta blockers ), Pt. on medications and Pt. on home beta blockers Normal cardiovascular exam Rhythm:Regular Rate:Normal  1. Left ventricular ejection fraction, by estimation, is 60 to 65%. The left ventricle has normal function. The left ventricle has no regional wall motion abnormalities. Left ventricular diastolic parameters were normal.  2. Right ventricular systolic function is normal. The right ventricular size is normal.  3. The mitral valve is normal in structure. No evidence of mitral valve regurgitation. No evidence of mitral stenosis.  4. The aortic valve is normal in structure. Aortic valve regurgitation is trivial. No aortic stenosis is present.  5. The inferior vena cava is normal in size with greater than 50% respiratory variability, suggesting right atrial pressure of 3 mmHg.    Neuro/Psych PSYCHIATRIC DISORDERS  Neuromuscular disease    GI/Hepatic GERD  Medicated and Controlled,(+)     substance abuse (h/o alcohol abuse)  alcohol use,   Endo/Other  Hypothyroidism (not taking meds)   Renal/GU Renal disease (AKI)     Musculoskeletal negative musculoskeletal ROS (+)   Abdominal   Peds negative pediatric ROS (+)  Hematology  (+) anemia ,   Anesthesia Other Findings   Reproductive/Obstetrics negative OB ROS                           Anesthesia Physical  Anesthesia Plan  ASA: 3  Anesthesia Plan: General   Post-op Pain Management:    Induction: Intravenous  PONV Risk Score and Plan: Propofol infusion  Airway Management Planned: Nasal Cannula and Natural Airway  Additional Equipment:   Intra-op Plan:   Post-operative Plan:   Informed Consent: I have reviewed the patients History and Physical, chart, labs and discussed the procedure including the risks, benefits and alternatives for the proposed anesthesia with the patient or authorized representative who has indicated his/her understanding and acceptance.     Dental advisory given  Plan Discussed with: CRNA and Surgeon  Anesthesia Plan Comments:        Anesthesia Quick Evaluation

## 2021-08-19 NOTE — Anesthesia Procedure Notes (Signed)
Date/Time: 08/19/2021 7:51 AM Performed by: Orlie Dakin, CRNA Pre-anesthesia Checklist: Patient identified, Emergency Drugs available, Suction available and Patient being monitored Patient Re-evaluated:Patient Re-evaluated prior to induction Oxygen Delivery Method: Nasal cannula Induction Type: IV induction Placement Confirmation: positive ETCO2

## 2021-08-20 LAB — SURGICAL PATHOLOGY

## 2021-08-22 ENCOUNTER — Encounter (HOSPITAL_COMMUNITY): Payer: Self-pay | Admitting: Internal Medicine

## 2022-01-02 ENCOUNTER — Encounter: Payer: Self-pay | Admitting: Internal Medicine

## 2022-02-18 ENCOUNTER — Ambulatory Visit: Payer: Medicare Other | Admitting: Gastroenterology

## 2022-03-04 ENCOUNTER — Ambulatory Visit: Payer: Medicare Other | Admitting: Gastroenterology

## 2022-08-04 ENCOUNTER — Other Ambulatory Visit (HOSPITAL_COMMUNITY): Payer: Self-pay | Admitting: Adult Health

## 2022-08-04 DIAGNOSIS — Z1231 Encounter for screening mammogram for malignant neoplasm of breast: Secondary | ICD-10-CM

## 2022-08-07 IMAGING — MR MR ABDOMEN WO/W CM MRCP
20 of 22 series · 44 of 48 positions shown · IV contrast (5 ml Gadavist)
Comparison: 02/28/2020

CLINICAL DATA: Follow-up pancreatitis and pancreatic pseudocyst.



[Series 4: cor haste · coronal · 6.0mm · 1.25mm/px · 1 of 26 slices shown]
[im 1/26]
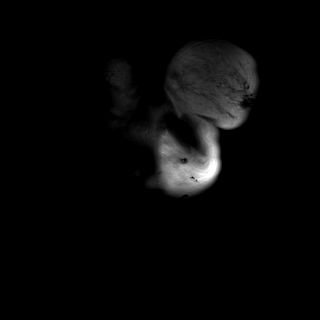

[Series 5: ax haste · axial · 6.0mm · 1.19mm/px · 1 of 30 slices shown]
[im 1/30]
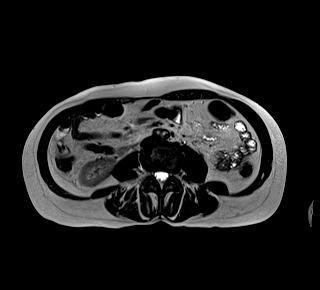

[Series 6: T2 fat-sat · axial · 6.0mm · 1.19mm/px · 1 of 30 slices shown]
[im 1/30]
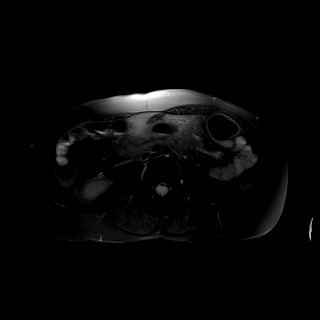

[Series 13: DWI · axial · 6.0mm · 1.42mm/px · 1 of 30 slices shown (1 of 4)]
[im 1/30]
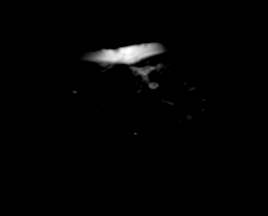

[Series 13: DWI · axial · 6.0mm · 1.42mm/px · 1 of 30 slices shown (2 of 4)]
[im 1/30]
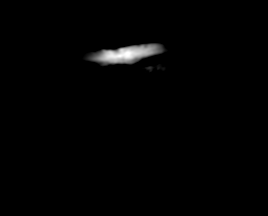

[Series 13: DWI · axial · 6.0mm · 1.42mm/px · 1 of 30 slices shown (3 of 4)]
[im 1/30]
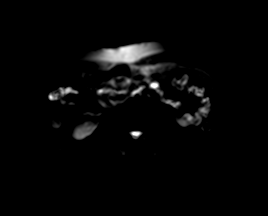

[Series 14: DWI · axial · 6.0mm · 1.42mm/px · 1 of 30 slices shown (4 of 4)]
[im 1/30]
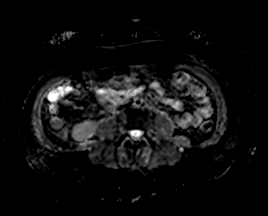

[Series 15: ax in and · axial · 3.0mm · 1.19mm/px · z∈[-239,-26]mm · 3 of 72 slices shown (1 of 2)]
[im 1/72]
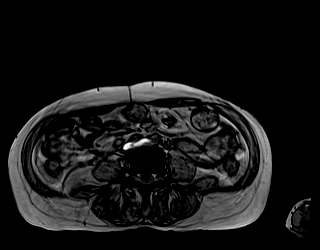
[im 36/72]
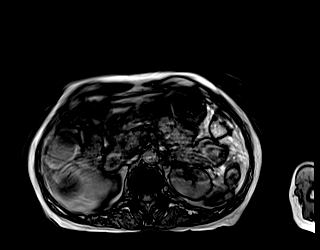
[im 72/72]
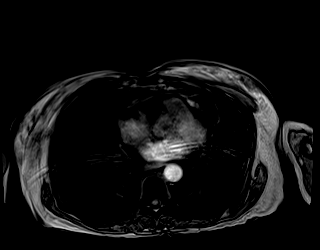

[Series 16: ax in and · axial · 3.0mm · 1.19mm/px · z∈[-239,-26]mm · 3 of 72 slices shown (2 of 2)]
[im 1/72]
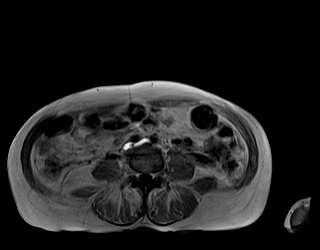
[im 36/72]
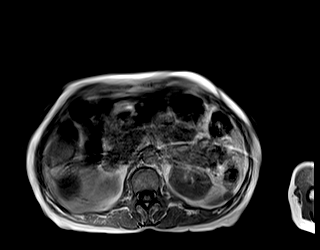
[im 72/72]
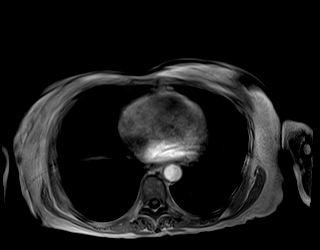

[Series 17: MRCP · coronal · 4.0mm · 1.12mm/px · 1 of 15 slices shown]
[im 1/15]
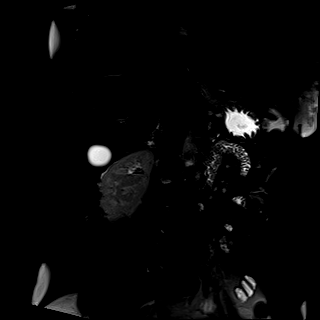

[Series 18: T1 dynamic · axial · non-contrast · 3.0mm · 1.19mm/px · z∈[-227,-14]mm · 3 of 72 slices shown (1 of 4)]
[im 1/72]
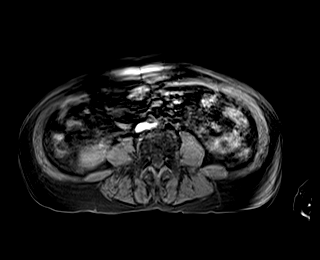
[im 36/72]
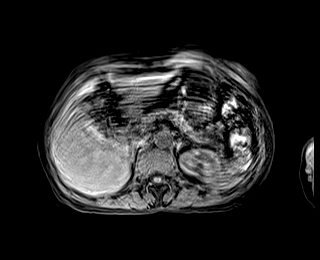
[im 72/72]
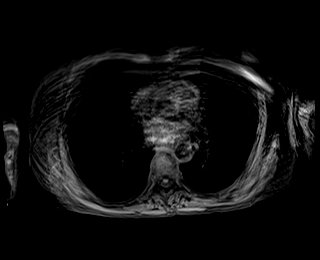

[Series 20: T1 dynamic post-contrast · axial · 3.0mm · 1.19mm/px · z∈[-227,-14]mm · 3 of 72 slices shown (1 of 6)]
[im 1/72]
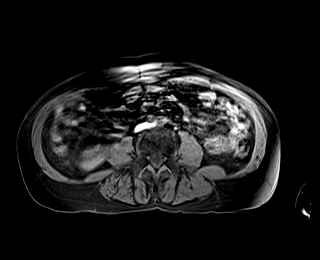
[im 36/72]
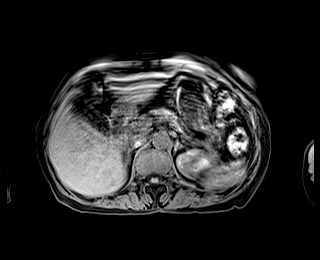
[im 72/72]
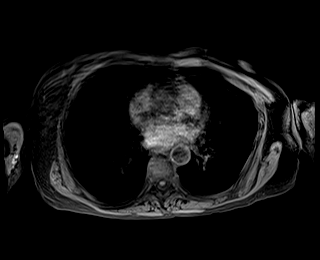

[Series 21: T1 dynamic · axial · 3.0mm · 1.19mm/px · z∈[-227,-14]mm · 3 of 72 slices shown (2 of 4)]
[im 1/72]
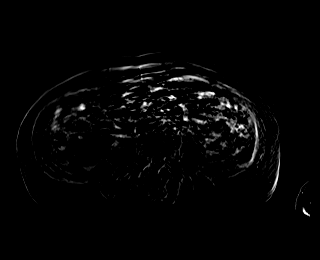
[im 36/72]
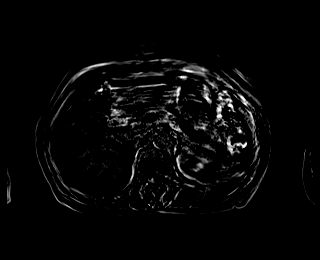
[im 72/72]
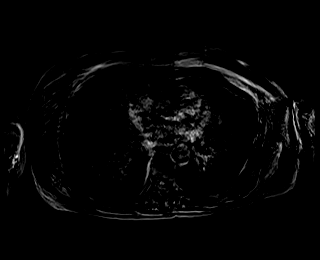

[Series 22: T1 dynamic post-contrast · axial · 3.0mm · 1.19mm/px · z∈[-227,-14]mm · 3 of 72 slices shown (2 of 6)]
[im 1/72]
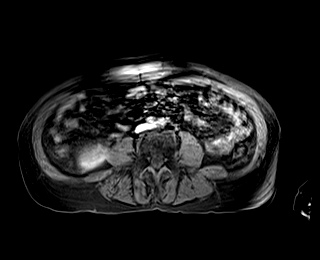
[im 36/72]
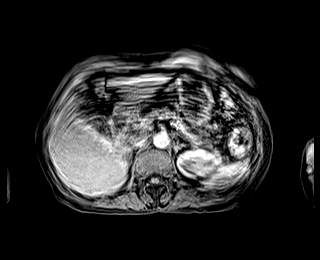
[im 72/72]
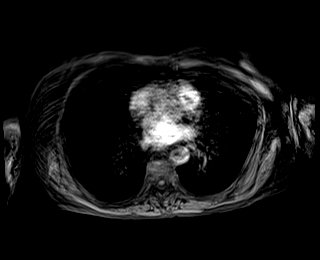

[Series 23: T1 dynamic · axial · 3.0mm · 1.19mm/px · z∈[-227,-14]mm · 3 of 72 slices shown (3 of 4)]
[im 1/72]
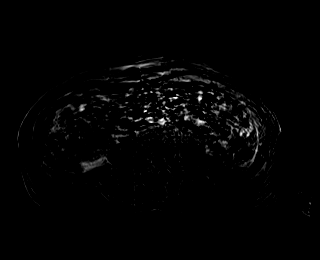
[im 36/72]
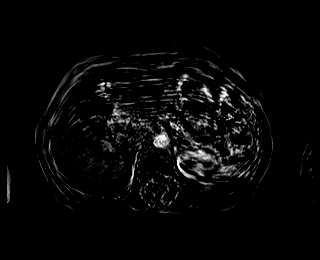
[im 72/72]
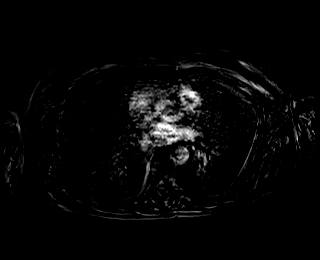

[Series 24: T1 dynamic post-contrast · axial · 3.0mm · 1.19mm/px · z∈[-227,-14]mm · 3 of 72 slices shown (3 of 6)]
[im 1/72]
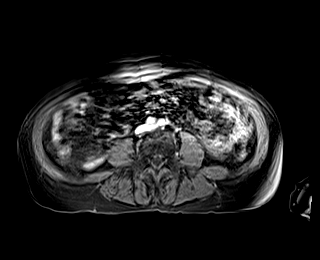
[im 36/72]
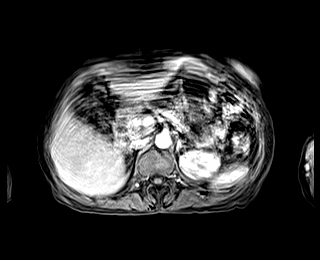
[im 72/72]
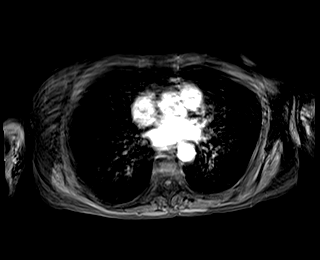

[Series 25: T1 dynamic · axial · 3.0mm · 1.19mm/px · z∈[-227,-14]mm · 3 of 72 slices shown (4 of 4)]
[im 1/72]
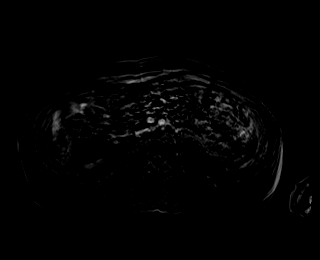
[im 36/72]
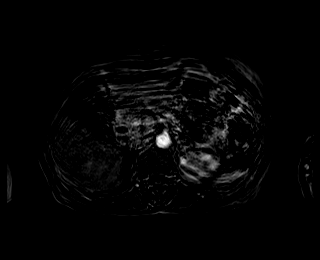
[im 72/72]
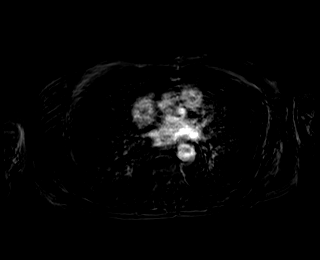

[Series 26: T1 dynamic post-contrast · coronal · 3.0mm · 1.31mm/px · 3 of 64 slices shown (4 of 6)]
[im 1/64]
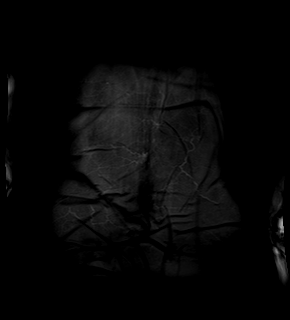
[im 32/64]
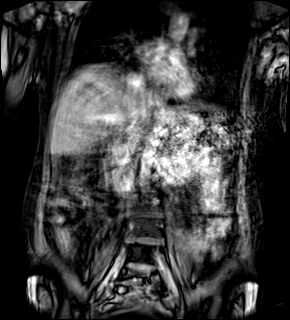
[im 64/64]
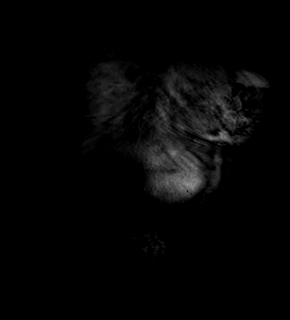

[Series 27: T1 dynamic post-contrast · axial · 3.0mm · 1.19mm/px · z∈[-227,-14]mm · 3 of 72 slices shown (5 of 6)]
[im 1/72]
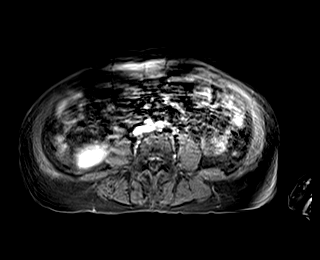
[im 36/72]
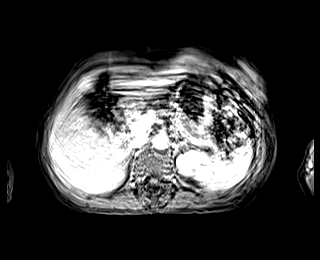
[im 72/72]
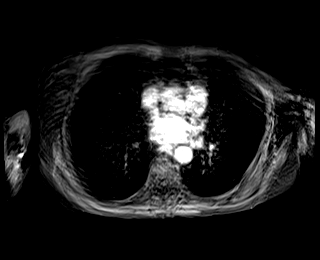

[Series 28: T1 dynamic post-contrast · axial · 3.0mm · 1.19mm/px · z∈[-227,-14]mm · 3 of 72 slices shown (6 of 6)]
[im 1/72]
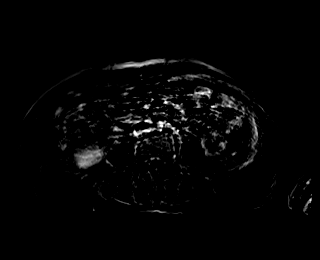
[im 36/72]
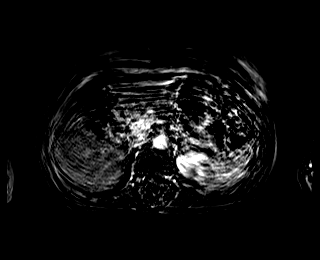
[im 72/72]
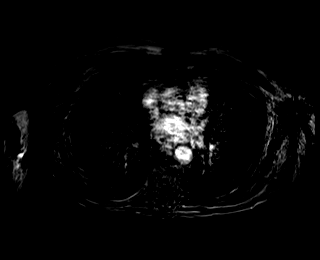

[44 of 48 positions shown; findings below may reference images not displayed]

FINDINGS: Lower chest: No acute findings.

Hepatobiliary: Image degradation by motion artifact noted. No
hepatic masses identified. 3 cm benign-appearing cyst in inferior
right lobe remains stable. Gallbladder is unremarkable. No evidence
of biliary ductal dilatation.

Pancreas: Image degradation by motion artifact noted. There has been
resolution of previously seen peripancreatic inflammatory changes
since prior study. Previously seen cystic lesions in the pancreatic
head and body have also resolved, consistent with resolving
pancreatic pseudocyst. Beaded appearance of the main pancreatic duct
is noted, consistent with chronic pancreatitis.

Spleen:  Within normal limits in size and appearance.

Adrenals/Urinary Tract: No masses identified. No evidence of
hydronephrosis.

Stomach/Bowel: Visualized portion unremarkable.

Vascular/Lymphatic: No pathologically enlarged lymph nodes
identified. No abdominal aortic aneurysm.

Other:  None.

Musculoskeletal:  No suspicious bone lesions identified.
IMPRESSION: Resolution of acute pancreatitis and pancreatic pseudocysts since
prior exam. No acute findings.

## 2022-10-06 ENCOUNTER — Ambulatory Visit (HOSPITAL_COMMUNITY)
Admission: RE | Admit: 2022-10-06 | Discharge: 2022-10-06 | Disposition: A | Discharge status: Home / Self Care | Payer: 59 | Source: Ambulatory Visit | Attending: Adult Health | Admitting: Adult Health

## 2022-10-06 DIAGNOSIS — Z1231 Encounter for screening mammogram for malignant neoplasm of breast: Secondary | ICD-10-CM

## 2023-02-26 DIAGNOSIS — E039 Hypothyroidism, unspecified: Secondary | ICD-10-CM | POA: Diagnosis not present

## 2023-02-26 DIAGNOSIS — I10 Essential (primary) hypertension: Secondary | ICD-10-CM | POA: Diagnosis not present

## 2023-02-26 DIAGNOSIS — E559 Vitamin D deficiency, unspecified: Secondary | ICD-10-CM | POA: Diagnosis not present

## 2023-03-17 DIAGNOSIS — Z79899 Other long term (current) drug therapy: Secondary | ICD-10-CM | POA: Diagnosis not present

## 2023-03-17 DIAGNOSIS — E039 Hypothyroidism, unspecified: Secondary | ICD-10-CM | POA: Diagnosis not present

## 2023-03-17 DIAGNOSIS — F1021 Alcohol dependence, in remission: Secondary | ICD-10-CM | POA: Diagnosis not present

## 2023-03-17 DIAGNOSIS — I10 Essential (primary) hypertension: Secondary | ICD-10-CM | POA: Diagnosis not present

## 2023-03-17 DIAGNOSIS — E871 Hypo-osmolality and hyponatremia: Secondary | ICD-10-CM | POA: Diagnosis not present

## 2023-03-17 DIAGNOSIS — E559 Vitamin D deficiency, unspecified: Secondary | ICD-10-CM | POA: Diagnosis not present

## 2023-04-20 ENCOUNTER — Emergency Department (HOSPITAL_COMMUNITY)
Admission: EM | Admit: 2023-04-20 | Discharge: 2023-04-20 | Disposition: A | Discharge status: Home / Self Care | Payer: Medicare HMO | Attending: Emergency Medicine | Admitting: Emergency Medicine

## 2023-04-20 ENCOUNTER — Emergency Department (HOSPITAL_COMMUNITY): Payer: Medicare HMO

## 2023-04-20 ENCOUNTER — Encounter (HOSPITAL_COMMUNITY): Payer: Self-pay | Admitting: *Deleted

## 2023-04-20 ENCOUNTER — Other Ambulatory Visit: Payer: Self-pay

## 2023-04-20 DIAGNOSIS — S42291A Other displaced fracture of upper end of right humerus, initial encounter for closed fracture: Secondary | ICD-10-CM | POA: Diagnosis not present

## 2023-04-20 DIAGNOSIS — R4182 Altered mental status, unspecified: Secondary | ICD-10-CM | POA: Insufficient documentation

## 2023-04-20 DIAGNOSIS — F1090 Alcohol use, unspecified, uncomplicated: Secondary | ICD-10-CM | POA: Insufficient documentation

## 2023-04-20 DIAGNOSIS — Z7982 Long term (current) use of aspirin: Secondary | ICD-10-CM | POA: Diagnosis not present

## 2023-04-20 DIAGNOSIS — M79601 Pain in right arm: Secondary | ICD-10-CM | POA: Diagnosis not present

## 2023-04-20 DIAGNOSIS — S20212A Contusion of left front wall of thorax, initial encounter: Secondary | ICD-10-CM | POA: Diagnosis not present

## 2023-04-20 DIAGNOSIS — I6782 Cerebral ischemia: Secondary | ICD-10-CM | POA: Diagnosis not present

## 2023-04-20 DIAGNOSIS — S42002A Fracture of unspecified part of left clavicle, initial encounter for closed fracture: Secondary | ICD-10-CM | POA: Insufficient documentation

## 2023-04-20 DIAGNOSIS — M19021 Primary osteoarthritis, right elbow: Secondary | ICD-10-CM | POA: Diagnosis not present

## 2023-04-20 DIAGNOSIS — W19XXXA Unspecified fall, initial encounter: Secondary | ICD-10-CM | POA: Diagnosis not present

## 2023-04-20 DIAGNOSIS — X58XXXA Exposure to other specified factors, initial encounter: Secondary | ICD-10-CM | POA: Insufficient documentation

## 2023-04-20 DIAGNOSIS — S42025A Nondisplaced fracture of shaft of left clavicle, initial encounter for closed fracture: Secondary | ICD-10-CM | POA: Diagnosis not present

## 2023-04-20 DIAGNOSIS — G238 Other specified degenerative diseases of basal ganglia: Secondary | ICD-10-CM | POA: Diagnosis not present

## 2023-04-20 DIAGNOSIS — R791 Abnormal coagulation profile: Secondary | ICD-10-CM | POA: Diagnosis not present

## 2023-04-20 DIAGNOSIS — M19012 Primary osteoarthritis, left shoulder: Secondary | ICD-10-CM | POA: Diagnosis not present

## 2023-04-20 DIAGNOSIS — I10 Essential (primary) hypertension: Secondary | ICD-10-CM | POA: Insufficient documentation

## 2023-04-20 DIAGNOSIS — S4991XA Unspecified injury of right shoulder and upper arm, initial encounter: Secondary | ICD-10-CM | POA: Diagnosis present

## 2023-04-20 DIAGNOSIS — S42201A Unspecified fracture of upper end of right humerus, initial encounter for closed fracture: Secondary | ICD-10-CM | POA: Insufficient documentation

## 2023-04-20 LAB — PROTIME-INR
INR: 1.2 (ref 0.8–1.2)
Prothrombin Time: 15.5 seconds — ABNORMAL HIGH (ref 11.4–15.2)

## 2023-04-20 LAB — COMPREHENSIVE METABOLIC PANEL
ALT: 20 U/L (ref 0–44)
AST: 37 U/L (ref 15–41)
Albumin: 4.4 g/dL (ref 3.5–5.0)
Alkaline Phosphatase: 75 U/L (ref 38–126)
Anion gap: 10 (ref 5–15)
BUN: 14 mg/dL (ref 8–23)
CO2: 20 mmol/L — ABNORMAL LOW (ref 22–32)
Calcium: 8.9 mg/dL (ref 8.9–10.3)
Chloride: 97 mmol/L — ABNORMAL LOW (ref 98–111)
Creatinine, Ser: 0.93 mg/dL (ref 0.44–1.00)
GFR, Estimated: >60 mL/min (ref >60)
Glucose, Bld: 129 mg/dL — ABNORMAL HIGH (ref 70–99)
Potassium: 4.1 mmol/L (ref 3.5–5.1)
Sodium: 127 mmol/L — ABNORMAL LOW (ref 135–145)
Total Bilirubin: 1.4 mg/dL — ABNORMAL HIGH (ref 0.3–1.2)
Total Protein: 8.4 g/dL — ABNORMAL HIGH (ref 6.5–8.1)

## 2023-04-20 LAB — CBC
HCT: 31 % — ABNORMAL LOW (ref 36.0–46.0)
Hemoglobin: 10.1 g/dL — ABNORMAL LOW (ref 12.0–15.0)
MCH: 30 pg (ref 26.0–34.0)
MCHC: 32.6 g/dL (ref 30.0–36.0)
MCV: 92 fL (ref 80.0–100.0)
Platelets: 325 10*3/uL (ref 150–400)
RBC: 3.37 MIL/uL — ABNORMAL LOW (ref 3.87–5.11)
RDW: 13.3 % (ref 11.5–15.5)
WBC: 10.3 10*3/uL (ref 4.0–10.5)
nRBC: 0 % (ref 0.0–0.2)

## 2023-04-20 LAB — CK: Total CK: 463 U/L — ABNORMAL HIGH (ref 38–234)

## 2023-04-20 LAB — ETHANOL: Alcohol, Ethyl (B): <10 mg/dL (ref <10)

## 2023-04-20 MED ORDER — ACETAMINOPHEN 325 MG PO TABS
650.0000 mg | ORAL_TABLET | Freq: Once | ORAL | Status: AC
  Administered 2023-04-20: 650 mg via ORAL
  Filled 2023-04-20: qty 2

## 2023-04-20 MED ORDER — OXYCODONE HCL 5 MG PO TABS: 2.5000 mg | ORAL_TABLET | ORAL | 0 refills | Status: DC | PRN

## 2023-04-20 NOTE — ED Notes (Signed)
Ice pack placed on pt's right shoulder and arm per MD order. Pt instructed on use.

## 2023-04-20 NOTE — ED Provider Notes (Signed)
[place] EMERGENCY DEPARTMENT AT [facility] Provider Note   CSN: [id number] Arrival date & time: 04/20/23  0865     History {Add pertinent medical, surgical, social history, OB history to HPI:1} No chief complaint on file.   Judy Davis is a 67 y.o. female.  67 year old female with a history of chronic alcoholism, vitamin D deficiency, hypertension, chronic hyponatremia, malnutrition, and gait and sufficiency who presents emergency department with bruising on her right shoulder.  Says that last night she started having some pain of her right shoulder.  Does not recall any trauma or injuries.  Says that she noticed some bruising and decided to come into the emergency department today for evaluation.  Also has some bruising over her left chest wall but is unsure how it got there.  Denies any head strike or LOC.  Patient was interviewed independently and says that she is not being abused.  Did also interview her sister independently who says that there are no concerns for abuse at home from her husband or any other family members or caretakers.  Patient does drink alcohol frequently with last drink yesterday.  Sister denies any confusion for the patient recently.       Home Medications Prior to Admission medications   Medication Sig Start Date End Date Taking? Authorizing Provider  aspirin EC 325 MG tablet Take 325 mg by mouth every 4 (four) hours as needed for moderate pain.    [provider]  Multiple Vitamin (MULTIVITAMIN WITH MINERALS) TABS tablet Take 1 tablet by mouth daily. 04/29/21   Rolly Salter, MD  Multiple Vitamin (MULTIVITAMIN WITH MINERALS) TABS tablet Take 1 tablet by mouth 2 (two) times daily.    [provider]      Allergies    Patient has no known allergies.    Review of Systems   Review of Systems  Physical Exam Updated Vital Signs There were no vitals taken for this visit. Physical Exam Vitals and nursing note reviewed.   Constitutional:      General: She is not in acute distress.    Appearance: She is well-developed.     Comments: Alert and oriented to self and place.  Thought that the month was June.  Per sister is at her mental baseline  HENT:     Head: Normocephalic and atraumatic.     Right Ear: External ear normal.     Left Ear: External ear normal.     Nose: Nose normal.  Eyes:     Extraocular Movements: Extraocular movements intact.     Conjunctiva/sclera: Conjunctivae normal.     Pupils: Pupils are equal, round, and reactive to light.  Neck:     Comments: No C-spine midline tenderness to palpation. Cardiovascular:     Rate and Rhythm: Normal rate and regular rhythm.     Heart sounds: No murmur heard.    Comments: Bruising over the left clavicle. Pulmonary:     Effort: Pulmonary effort is normal. No respiratory distress.     Breath sounds: Normal breath sounds.  Abdominal:     General: Abdomen is flat. There is no distension.     Palpations: Abdomen is soft. There is no mass.     Tenderness: There is no abdominal tenderness. There is no guarding.  Musculoskeletal:     Cervical back: Normal range of motion and neck supple.     Right lower leg: No edema.     Left lower leg: No edema.  Comments: Bruising over right shoulder.  Active range of motion limited due to pain.  Symmetrically palpable radial and ulnar pulses. Capillary refill <2 seconds to all digits.  Intact sensation to light touch of the radial, median and ulnar nerves demonstrated by testing in the dorsal web space of the thumb, the hypothenar eminence of the palm, and the radial aspect of the dorsum of the hand.  Intact motor function of the radial, median and ulnar nerves demonstrated by strength of hand grip, and spreading of the 2nd through 5th digits, thumb apposition, and ability to make "OK sign".  No snuffbox or other bony TTP of upper extremity.    Skin:    General: Skin is warm and dry.  Neurological:      Mental Status: She is alert. Mental status is at baseline.     Cranial Nerves: No cranial nerve deficit.  Psychiatric:        Mood and Affect: Mood normal.        ED Results / Procedures / Treatments   Labs (all labs ordered are listed, but only abnormal results are displayed) Labs Reviewed - No data to display  EKG None  Radiology No results found.  Procedures Procedures  {Document cardiac monitor, telemetry assessment procedure when appropriate:1}  Medications Ordered in ED Medications - No data to display  ED Course/ Medical Decision Making/ A&P   {   Click here for ABCD2, HEART and other calculatorsREFRESH Note before signing :1}                              Medical Decision Making Amount and/or Complexity of Data Reviewed Labs: ordered. Radiology: ordered.   ***  {Document critical care time when appropriate:1} {Document review of labs and clinical decision tools ie heart score, Chads2Vasc2 etc:1}  {Document your independent review of radiology images, and any outside records:1} {Document your discussion with family members, caretakers, and with consultants:1} {Document social determinants of health affecting pt's care:1} {Document your decision making why or why not admission, treatments were needed:1} Final Clinical Impression(s) / ED Diagnoses Final diagnoses:  None    Rx / DC Orders ED Discharge Orders     None

## 2023-04-20 NOTE — Discharge Instructions (Addendum)
You were seen for your broken arm and collarbone in the emergency department.   At home, please take Tylenol and ibuprofen for your pain. You may also take the oxycodone we have prescribed you for any breakthrough pain that may have.  Do not take this before driving or operating heavy machinery.  Do not take this medication with alcohol.    Check your MyChart online for the results of any tests that had not resulted by the time you left the emergency department.   Follow-up with an orthopedic doctor in 1 week regarding her symptoms.  Return immediately to the emergency department if you experience any of the following: Worsening pain, numbness or weakness of your hand, or any other concerning symptoms.    Thank you for visiting our Emergency Department. It was a pleasure taking care of you today.

## 2023-04-20 NOTE — ED Triage Notes (Signed)
Pt c/o right arm pain that started last night when she went to bed  Right arm is swollen and red and pt has very limited movement  Pt has healing bruise to left side of chest and does not know how it appeared  Pt denies any falls

## 2023-04-24 ENCOUNTER — Emergency Department (HOSPITAL_COMMUNITY): Payer: Medicare HMO

## 2023-04-24 ENCOUNTER — Encounter (HOSPITAL_COMMUNITY): Payer: Self-pay

## 2023-04-24 ENCOUNTER — Inpatient Hospital Stay (HOSPITAL_COMMUNITY): Payer: Medicare HMO

## 2023-04-24 ENCOUNTER — Inpatient Hospital Stay (HOSPITAL_COMMUNITY)
Admission: EM | Admit: 2023-04-24 | Discharge: 2023-05-01 | DRG: 812 | Disposition: A | Discharge status: Home Health | Payer: Medicare HMO | Attending: Family Medicine | Admitting: Family Medicine

## 2023-04-24 ENCOUNTER — Other Ambulatory Visit (HOSPITAL_COMMUNITY): Payer: Medicare HMO

## 2023-04-24 ENCOUNTER — Other Ambulatory Visit: Payer: Self-pay

## 2023-04-24 DIAGNOSIS — K6389 Other specified diseases of intestine: Secondary | ICD-10-CM | POA: Diagnosis not present

## 2023-04-24 DIAGNOSIS — I10 Essential (primary) hypertension: Secondary | ICD-10-CM | POA: Diagnosis present

## 2023-04-24 DIAGNOSIS — Z79890 Hormone replacement therapy (postmenopausal): Secondary | ICD-10-CM

## 2023-04-24 DIAGNOSIS — S42309A Unspecified fracture of shaft of humerus, unspecified arm, initial encounter for closed fracture: Secondary | ICD-10-CM | POA: Diagnosis present

## 2023-04-24 DIAGNOSIS — D62 Acute posthemorrhagic anemia: Principal | ICD-10-CM | POA: Diagnosis present

## 2023-04-24 DIAGNOSIS — I700 Atherosclerosis of aorta: Secondary | ICD-10-CM | POA: Diagnosis not present

## 2023-04-24 DIAGNOSIS — R296 Repeated falls: Secondary | ICD-10-CM | POA: Diagnosis present

## 2023-04-24 DIAGNOSIS — S42201D Unspecified fracture of upper end of right humerus, subsequent encounter for fracture with routine healing: Secondary | ICD-10-CM

## 2023-04-24 DIAGNOSIS — Z91199 Patient's noncompliance with other medical treatment and regimen due to unspecified reason: Secondary | ICD-10-CM | POA: Diagnosis not present

## 2023-04-24 DIAGNOSIS — K921 Melena: Secondary | ICD-10-CM | POA: Diagnosis not present

## 2023-04-24 DIAGNOSIS — S42002D Fracture of unspecified part of left clavicle, subsequent encounter for fracture with routine healing: Secondary | ICD-10-CM | POA: Diagnosis not present

## 2023-04-24 DIAGNOSIS — E039 Hypothyroidism, unspecified: Secondary | ICD-10-CM | POA: Diagnosis not present

## 2023-04-24 DIAGNOSIS — E871 Hypo-osmolality and hyponatremia: Secondary | ICD-10-CM | POA: Diagnosis not present

## 2023-04-24 DIAGNOSIS — Z800 Family history of malignant neoplasm of digestive organs: Secondary | ICD-10-CM

## 2023-04-24 DIAGNOSIS — K2950 Unspecified chronic gastritis without bleeding: Secondary | ICD-10-CM | POA: Diagnosis not present

## 2023-04-24 DIAGNOSIS — R55 Syncope and collapse: Secondary | ICD-10-CM | POA: Diagnosis not present

## 2023-04-24 DIAGNOSIS — E7800 Pure hypercholesterolemia, unspecified: Secondary | ICD-10-CM | POA: Diagnosis present

## 2023-04-24 DIAGNOSIS — K449 Diaphragmatic hernia without obstruction or gangrene: Secondary | ICD-10-CM | POA: Diagnosis present

## 2023-04-24 DIAGNOSIS — K2100 Gastro-esophageal reflux disease with esophagitis, without bleeding: Secondary | ICD-10-CM | POA: Diagnosis present

## 2023-04-24 DIAGNOSIS — K922 Gastrointestinal hemorrhage, unspecified: Secondary | ICD-10-CM | POA: Diagnosis not present

## 2023-04-24 DIAGNOSIS — K3189 Other diseases of stomach and duodenum: Secondary | ICD-10-CM | POA: Diagnosis not present

## 2023-04-24 DIAGNOSIS — S42291A Other displaced fracture of upper end of right humerus, initial encounter for closed fracture: Secondary | ICD-10-CM | POA: Diagnosis not present

## 2023-04-24 DIAGNOSIS — J9811 Atelectasis: Secondary | ICD-10-CM | POA: Diagnosis not present

## 2023-04-24 DIAGNOSIS — Y900 Blood alcohol level of less than 20 mg/100 ml: Secondary | ICD-10-CM | POA: Diagnosis present

## 2023-04-24 DIAGNOSIS — W19XXXD Unspecified fall, subsequent encounter: Secondary | ICD-10-CM | POA: Diagnosis present

## 2023-04-24 DIAGNOSIS — F1010 Alcohol abuse, uncomplicated: Secondary | ICD-10-CM | POA: Diagnosis present

## 2023-04-24 DIAGNOSIS — E876 Hypokalemia: Secondary | ICD-10-CM | POA: Diagnosis present

## 2023-04-24 DIAGNOSIS — M47812 Spondylosis without myelopathy or radiculopathy, cervical region: Secondary | ICD-10-CM | POA: Diagnosis not present

## 2023-04-24 DIAGNOSIS — R531 Weakness: Secondary | ICD-10-CM | POA: Diagnosis not present

## 2023-04-24 DIAGNOSIS — Z79899 Other long term (current) drug therapy: Secondary | ICD-10-CM

## 2023-04-24 DIAGNOSIS — Z96642 Presence of left artificial hip joint: Secondary | ICD-10-CM | POA: Diagnosis present

## 2023-04-24 DIAGNOSIS — R9431 Abnormal electrocardiogram [ECG] [EKG]: Secondary | ICD-10-CM | POA: Diagnosis not present

## 2023-04-24 DIAGNOSIS — D649 Anemia, unspecified: Secondary | ICD-10-CM | POA: Diagnosis not present

## 2023-04-24 DIAGNOSIS — E878 Other disorders of electrolyte and fluid balance, not elsewhere classified: Secondary | ICD-10-CM | POA: Diagnosis present

## 2023-04-24 DIAGNOSIS — E785 Hyperlipidemia, unspecified: Secondary | ICD-10-CM | POA: Diagnosis not present

## 2023-04-24 DIAGNOSIS — W19XXXA Unspecified fall, initial encounter: Secondary | ICD-10-CM | POA: Diagnosis not present

## 2023-04-24 DIAGNOSIS — K222 Esophageal obstruction: Secondary | ICD-10-CM | POA: Diagnosis not present

## 2023-04-24 DIAGNOSIS — G238 Other specified degenerative diseases of basal ganglia: Secondary | ICD-10-CM | POA: Diagnosis not present

## 2023-04-24 DIAGNOSIS — I6523 Occlusion and stenosis of bilateral carotid arteries: Secondary | ICD-10-CM | POA: Diagnosis not present

## 2023-04-24 DIAGNOSIS — S199XXA Unspecified injury of neck, initial encounter: Secondary | ICD-10-CM | POA: Diagnosis not present

## 2023-04-24 DIAGNOSIS — S0990XA Unspecified injury of head, initial encounter: Secondary | ICD-10-CM | POA: Diagnosis not present

## 2023-04-24 DIAGNOSIS — S42002A Fracture of unspecified part of left clavicle, initial encounter for closed fracture: Secondary | ICD-10-CM | POA: Diagnosis present

## 2023-04-24 DIAGNOSIS — R195 Other fecal abnormalities: Secondary | ICD-10-CM

## 2023-04-24 LAB — URINALYSIS, ROUTINE W REFLEX MICROSCOPIC
Bilirubin Urine: NEGATIVE
Glucose, UA: NEGATIVE mg/dL
Ketones, ur: 20 mg/dL — AB
Nitrite: POSITIVE — AB
Protein, ur: NEGATIVE mg/dL
Specific Gravity, Urine: 1.009 (ref 1.005–1.030)
pH: 5 (ref 5.0–8.0)

## 2023-04-24 LAB — CBC WITH DIFFERENTIAL/PLATELET
Abs Immature Granulocytes: 0.08 10*3/uL — ABNORMAL HIGH (ref 0.00–0.07)
Basophils Absolute: 0.1 10*3/uL (ref 0.0–0.1)
Basophils Relative: 0 %
Eosinophils Absolute: 0 10*3/uL (ref 0.0–0.5)
Eosinophils Relative: 0 %
HCT: 26 % — ABNORMAL LOW (ref 36.0–46.0)
Hemoglobin: 8.6 g/dL — ABNORMAL LOW (ref 12.0–15.0)
Immature Granulocytes: 1 %
Lymphocytes Relative: 10 %
Lymphs Abs: 1.2 10*3/uL (ref 0.7–4.0)
MCH: 30.4 pg (ref 26.0–34.0)
MCHC: 33.1 g/dL (ref 30.0–36.0)
MCV: 91.9 fL (ref 80.0–100.0)
Monocytes Absolute: 0.9 10*3/uL (ref 0.1–1.0)
Monocytes Relative: 8 %
Neutro Abs: 9 10*3/uL — ABNORMAL HIGH (ref 1.7–7.7)
Neutrophils Relative %: 81 %
Platelets: 355 10*3/uL (ref 150–400)
RBC: 2.83 MIL/uL — ABNORMAL LOW (ref 3.87–5.11)
RDW: 12.9 % (ref 11.5–15.5)
WBC: 11.2 10*3/uL — ABNORMAL HIGH (ref 4.0–10.5)
nRBC: 0 % (ref 0.0–0.2)

## 2023-04-24 LAB — COMPREHENSIVE METABOLIC PANEL
ALT: 16 U/L (ref 0–44)
AST: 29 U/L (ref 15–41)
Albumin: 4 g/dL (ref 3.5–5.0)
Alkaline Phosphatase: 61 U/L (ref 38–126)
Anion gap: 11 (ref 5–15)
BUN: 14 mg/dL (ref 8–23)
CO2: 23 mmol/L (ref 22–32)
Calcium: 9.1 mg/dL (ref 8.9–10.3)
Chloride: 90 mmol/L — ABNORMAL LOW (ref 98–111)
Creatinine, Ser: 0.71 mg/dL (ref 0.44–1.00)
GFR, Estimated: >60 mL/min (ref >60)
Glucose, Bld: 123 mg/dL — ABNORMAL HIGH (ref 70–99)
Potassium: 3.5 mmol/L (ref 3.5–5.1)
Sodium: 124 mmol/L — ABNORMAL LOW (ref 135–145)
Total Bilirubin: 1.1 mg/dL (ref 0.3–1.2)
Total Protein: 8.1 g/dL (ref 6.5–8.1)

## 2023-04-24 LAB — ETHANOL: Alcohol, Ethyl (B): <10 mg/dL (ref <10)

## 2023-04-24 LAB — POC OCCULT BLOOD, ED: Fecal Occult Blood: POSITIVE

## 2023-04-24 LAB — TROPONIN I (HIGH SENSITIVITY)
Troponin I (High Sensitivity): 3 ng/L (ref <18)
Troponin I (High Sensitivity): 3 ng/L (ref <18)

## 2023-04-24 LAB — IRON AND TIBC
Iron: 32 ug/dL (ref 28–170)
Saturation Ratios: 10 % — ABNORMAL LOW (ref 10.4–31.8)
TIBC: 321 ug/dL (ref 250–450)
UIBC: 289 ug/dL

## 2023-04-24 LAB — CBG MONITORING, ED: Glucose-Capillary: 105 mg/dL — ABNORMAL HIGH (ref 70–99)

## 2023-04-24 LAB — FERRITIN: Ferritin: 154 ng/mL (ref 11–307)

## 2023-04-24 LAB — TSH: TSH: 3.697 u[IU]/mL (ref 0.350–4.500)

## 2023-04-24 MED ORDER — AMLODIPINE BESYLATE 5 MG PO TABS
5.0000 mg | ORAL_TABLET | Freq: Every day | ORAL | Status: DC
  Administered 2023-04-25 – 2023-05-01 (×8): 5 mg via ORAL
  Filled 2023-04-24 (×8): qty 1

## 2023-04-24 MED ORDER — PANTOPRAZOLE SODIUM 40 MG IV SOLR
40.0000 mg | Freq: Two times a day (BID) | INTRAVENOUS | Status: DC
  Administered 2023-04-24 – 2023-05-01 (×16): 40 mg via INTRAVENOUS
  Filled 2023-04-24 (×15): qty 10

## 2023-04-24 MED ORDER — ONDANSETRON HCL 4 MG/2ML IJ SOLN: 4.0000 mg | Freq: Four times a day (QID) | INTRAMUSCULAR | Status: DC | PRN

## 2023-04-24 MED ORDER — LEVOTHYROXINE SODIUM 50 MCG PO TABS
50.0000 ug | ORAL_TABLET | Freq: Every day | ORAL | Status: DC
  Administered 2023-04-25 – 2023-05-01 (×5): 50 ug via ORAL
  Filled 2023-04-24 (×5): qty 1

## 2023-04-24 MED ORDER — BISACODYL 10 MG RE SUPP: 10.0000 mg | Freq: Every day | RECTAL | Status: DC | PRN

## 2023-04-24 MED ORDER — POLYETHYLENE GLYCOL 3350 17 G PO PACK: 17.0000 g | PACK | Freq: Every day | ORAL | Status: DC | PRN

## 2023-04-24 MED ORDER — THIAMINE MONONITRATE 100 MG PO TABS
100.0000 mg | ORAL_TABLET | Freq: Every day | ORAL | Status: DC
  Administered 2023-04-24 – 2023-05-01 (×8): 100 mg via ORAL
  Filled 2023-04-24 (×8): qty 1

## 2023-04-24 MED ORDER — SODIUM CHLORIDE 0.9% FLUSH
3.0000 mL | Freq: Two times a day (BID) | INTRAVENOUS | Status: DC
  Administered 2023-04-25 – 2023-05-01 (×13): 3 mL via INTRAVENOUS

## 2023-04-24 MED ORDER — THIAMINE HCL 100 MG/ML IJ SOLN: 100.0000 mg | Freq: Every day | INTRAMUSCULAR | Status: DC

## 2023-04-24 MED ORDER — LORAZEPAM 1 MG PO TABS: 1.0000 mg | ORAL_TABLET | ORAL | Status: AC | PRN

## 2023-04-24 MED ORDER — ACETAMINOPHEN 325 MG PO TABS
650.0000 mg | ORAL_TABLET | Freq: Four times a day (QID) | ORAL | Status: DC | PRN
  Administered 2023-04-25 – 2023-04-30 (×7): 650 mg via ORAL
  Filled 2023-04-24 (×7): qty 2

## 2023-04-24 MED ORDER — DIAZEPAM 2 MG PO TABS
2.0000 mg | ORAL_TABLET | Freq: Three times a day (TID) | ORAL | Status: AC
  Administered 2023-04-24 – 2023-04-26 (×6): 2 mg via ORAL
  Filled 2023-04-24 (×6): qty 1

## 2023-04-24 MED ORDER — LORAZEPAM 2 MG/ML IJ SOLN: 1.0000 mg | INTRAMUSCULAR | Status: AC | PRN

## 2023-04-24 MED ORDER — FOLIC ACID 1 MG PO TABS
1.0000 mg | ORAL_TABLET | Freq: Every day | ORAL | Status: DC
  Administered 2023-04-24 – 2023-05-01 (×8): 1 mg via ORAL
  Filled 2023-04-24 (×8): qty 1

## 2023-04-24 MED ORDER — SODIUM CHLORIDE 0.9% FLUSH
3.0000 mL | Freq: Two times a day (BID) | INTRAVENOUS | Status: DC
  Administered 2023-04-25 – 2023-04-26 (×4): 3 mL via INTRAVENOUS

## 2023-04-24 MED ORDER — SODIUM CHLORIDE 0.9% FLUSH: 3.0000 mL | INTRAVENOUS | Status: DC | PRN

## 2023-04-24 MED ORDER — PANTOPRAZOLE SODIUM 40 MG IV SOLR
40.0000 mg | Freq: Once | INTRAVENOUS | Status: DC
  Filled 2023-04-24: qty 10

## 2023-04-24 MED ORDER — METOPROLOL TARTRATE 25 MG PO TABS
75.0000 mg | ORAL_TABLET | Freq: Two times a day (BID) | ORAL | Status: DC
  Administered 2023-04-25 – 2023-05-01 (×14): 75 mg via ORAL
  Filled 2023-04-24 (×14): qty 3

## 2023-04-24 MED ORDER — ADULT MULTIVITAMIN W/MINERALS CH
1.0000 | ORAL_TABLET | Freq: Every day | ORAL | Status: DC
  Administered 2023-04-24 – 2023-05-01 (×8): 1 via ORAL
  Filled 2023-04-24 (×8): qty 1

## 2023-04-24 MED ORDER — SODIUM CHLORIDE 0.9 % IV SOLN: INTRAVENOUS | Status: DC | PRN

## 2023-04-24 MED ORDER — THIAMINE HCL 100 MG PO TABS
100.0000 mg | ORAL_TABLET | Freq: Once | ORAL | Status: AC
  Administered 2023-04-24: 100 mg via ORAL
  Filled 2023-04-24 (×2): qty 1

## 2023-04-24 MED ORDER — TRAZODONE HCL 50 MG PO TABS
50.0000 mg | ORAL_TABLET | Freq: Every evening | ORAL | Status: DC | PRN
  Administered 2023-04-25 – 2023-04-30 (×5): 50 mg via ORAL
  Filled 2023-04-24 (×5): qty 1

## 2023-04-24 MED ORDER — SODIUM CHLORIDE 0.9 % IV SOLN: INTRAVENOUS | Status: DC

## 2023-04-24 MED ORDER — ACETAMINOPHEN 650 MG RE SUPP
650.0000 mg | Freq: Four times a day (QID) | RECTAL | Status: DC | PRN
  Administered 2023-04-26: 650 mg via RECTAL
  Filled 2023-04-24: qty 1

## 2023-04-24 NOTE — ED Notes (Signed)
Pt states she does not have a pacemaker.

## 2023-04-24 NOTE — ED Provider Notes (Signed)
[place] EMERGENCY DEPARTMENT AT [facility] Provider Note   CSN: [id number] Arrival date & time: 04/24/23  5956     History  Chief Complaint  Patient presents with   Judeth Cornfield Yanik is a 67 y.o. female.  See ED for evaluation of syncope.  Patient states "my brother said I fell out".  She states last thing she remembers was standing in the room talking to her brother and next thing she knew she woke up on the floor, does not remember falling.  No fevers or chills, no nausea or vomiting.  She has pain to the right shoulder but had a fracture 4 days ago and states the pain is unchanged.  Denies any numbness tingling or weakness.  Did not have any preceding symptoms such as palpitations, dizziness, shortness of breath, headache or any other symptoms.  Patient has past history of hypothyroidism, GERD, anemia, alcohol abuse, high cholesterol, and gait instability.   Fall       Home Medications Prior to Admission medications   Medication Sig Start Date End Date Taking? Authorizing Provider  aspirin EC 325 MG tablet Take 325 mg by mouth every 4 (four) hours as needed for moderate pain.    [provider]  Multiple Vitamin (MULTIVITAMIN WITH MINERALS) TABS tablet Take 1 tablet by mouth daily. 04/29/21   Rolly Salter, MD  Multiple Vitamin (MULTIVITAMIN WITH MINERALS) TABS tablet Take 1 tablet by mouth 2 (two) times daily.    [provider]  oxyCODONE (ROXICODONE) 5 MG immediate release tablet Take 0.5 tablets (2.5 mg total) by mouth every 4 (four) hours as needed for severe pain. 04/20/23   Rondel Baton, MD      Allergies    Patient has no known allergies.    Review of Systems   Review of Systems  Physical Exam Updated Vital Signs BP 138/69   Pulse 77   Temp 98 F (36.7 C) (Oral)   Resp 16   Ht 5\' 6"  (1.676 m)   Wt 54.2 kg   SpO2 100%   BMI 19.30 kg/m  Physical Exam Vitals and nursing note reviewed.  Constitutional:       General: She is not in acute distress.    Appearance: She is well-developed.  HENT:     Head: Normocephalic and atraumatic.     Nose: Nose normal.     Mouth/Throat:     Mouth: Mucous membranes are moist.  Eyes:     Conjunctiva/sclera: Conjunctivae normal.  Cardiovascular:     Rate and Rhythm: Normal rate and regular rhythm.     Heart sounds: No murmur heard. Pulmonary:     Effort: Pulmonary effort is normal. No respiratory distress.     Breath sounds: Normal breath sounds.  Abdominal:     Palpations: Abdomen is soft.     Tenderness: There is no abdominal tenderness.  Musculoskeletal:        General: No swelling. Normal range of motion.     Cervical back: Neck supple.  Skin:    General: Skin is warm and dry.     Capillary Refill: Capillary refill takes less than 2 seconds.  Neurological:     General: No focal deficit present.     Mental Status: She is alert.     Comments: And oriented person place and situation but confused as to the month  Psychiatric:        Mood and Affect: Mood normal.  ED Results / Procedures / Treatments   Labs (all labs ordered are listed, but only abnormal results are displayed) Labs Reviewed  CBC WITH DIFFERENTIAL/PLATELET - Abnormal; Notable for the following components:      Result Value   WBC 11.2 (*)    RBC 2.83 (*)    Hemoglobin 8.6 (*)    HCT 26.0 (*)    Neutro Abs 9.0 (*)    Abs Immature Granulocytes 0.08 (*)    All other components within normal limits  COMPREHENSIVE METABOLIC PANEL - Abnormal; Notable for the following components:   Sodium 124 (*)    Chloride 90 (*)    Glucose, Bld 123 (*)    All other components within normal limits  IRON AND TIBC - Abnormal; Notable for the following components:   Saturation Ratios 10 (*)    All other components within normal limits  CBG MONITORING, ED - Abnormal; Notable for the following components:   Glucose-Capillary 105 (*)    All other components within normal limits  ETHANOL   FERRITIN  URINALYSIS, ROUTINE W REFLEX MICROSCOPIC  POC OCCULT BLOOD, ED  TROPONIN I (HIGH SENSITIVITY)  TROPONIN I (HIGH SENSITIVITY)    EKG None  Radiology CT CERVICAL SPINE WO CONTRAST  Result Date: 04/24/2023 CLINICAL DATA:  Trauma, fall EXAM: CT CERVICAL SPINE WITHOUT CONTRAST TECHNIQUE: Multidetector CT imaging of the cervical spine was performed without intravenous contrast. Multiplanar CT image reconstructions were also generated. RADIATION DOSE REDUCTION: This exam was performed according to the departmental dose-optimization program which includes automated exposure control, adjustment of the mA and/or kV according to patient size and/or use of iterative reconstruction technique. COMPARISON:  03/18/2018 FINDINGS: Alignment: Alignment of posterior margins of vertebral bodies appears normal. Skull base and vertebrae: No recent fracture is seen. Degenerative changes are noted with bony spurs at multiple levels, more prominent at C5-C6 and C6-C7 levels. Soft tissues and spinal canal: There is extrinsic pressure over the ventral margin of thecal sac caused by posterior bony spurs at C5-C6 level extending more to the right. Disc levels: There is encroachment of neural foramina by bony spurs from C3-C7 levels. Upper chest: Scarring is seen in the apices of both lungs. Other: Scattered arterial calcifications are seen. IMPRESSION: No recent fracture is seen in cervical spine. Cervical spondylosis with encroachment of neural foramina from C3 to C7 levels, more severe at C5-C6 level. Electronically Signed   By: Ernie Avena M.D.   On: 04/24/2023 13:21   CT HEAD WO CONTRAST  Result Date: 04/24/2023 CLINICAL DATA:  Trauma, fall, syncope EXAM: CT HEAD WITHOUT CONTRAST TECHNIQUE: Contiguous axial images were obtained from the base of the skull through the vertex without intravenous contrast. RADIATION DOSE REDUCTION: This exam was performed according to the departmental dose-optimization program  which includes automated exposure control, adjustment of the mA and/or kV according to patient size and/or use of iterative reconstruction technique. COMPARISON:  04/20/2023 FINDINGS: Brain: No acute intracranial findings are seen. There are no signs of bleeding within the cranium. Calcifications are seen in basal ganglia. Cortical sulci are prominent. There is decreased density in periventricular and subcortical white matter. Vascular: Unremarkable. Skull: No fracture is seen in calvarium. Sinuses/Orbits: There are no air-fluid levels or significant mucosal thickening. Old blowout fracture is seen in the floor of the left orbit which has not changed in comparison with the earlier examination done on 03/18/2018. Other: None. IMPRESSION: No acute intracranial findings are seen in noncontrast CT brain. Atrophy. Small-vessel disease. Old blowout fracture  is seen in the floor of left orbit. Electronically Signed   By: Ernie Avena M.D.   On: 04/24/2023 13:14   DG Chest 1 View  Result Date: 04/24/2023 CLINICAL DATA:  Syncope.  Fall EXAM: CHEST  1 VIEW COMPARISON:  04/21/2021 FINDINGS: Minimal linear opacity at the lung bases likely scar or atelectasis. No consolidation. Normal cardiopericardial silhouette. No edema, pneumothorax or effusion. Known comminuted right humeral neck fracture. Please correlate with the recent shoulder x-ray of 04/20/2023. IMPRESSION: Slight basilar atelectasis.  No pneumothorax or effusion. Known right proximal humeral fracture Electronically Signed   By: Karen Kays M.D.   On: 04/24/2023 12:51    Procedures Procedures    Medications Ordered in ED Medications  pantoprazole (PROTONIX) injection 40 mg (40 mg Intravenous Given 04/24/23 1457)  LORazepam (ATIVAN) tablet 1-4 mg (has no administration in time range)    Or  LORazepam (ATIVAN) injection 1-4 mg (has no administration in time range)  thiamine (VITAMIN B1) tablet 100 mg (has no administration in time range)    Or   thiamine (VITAMIN B1) injection 100 mg (has no administration in time range)  folic acid (FOLVITE) tablet 1 mg (has no administration in time range)  multivitamin with minerals tablet 1 tablet (has no administration in time range)  diazepam (VALIUM) tablet 2 mg (has no administration in time range)  ondansetron (ZOFRAN) injection 4 mg (has no administration in time range)  thiamine (VITAMIN B1) tablet 100 mg (100 mg Oral Given 04/24/23 1033)    ED Course/ Medical Decision Making/ A&P                                 Medical Decision Making This patient presents to the ED for concern of syncope, this involves an extensive number of treatment options, and is a complaint that carries with it a high risk of complications and morbidity.  The differential diagnosis includes arrhythmia, seizure, vasovagal syncope, anemia, orthostatic hypotension, CVA, PE, other   Co morbidities that complicate the patient evaluation :   Alcohol abuse, anemia   Additional history obtained:  Additional history obtained from EMR External records from outside source obtained and reviewed including prior notes, prior labs   Lab Tests:  I Ordered, and personally interpreted labs.  The pertinent results include: CBC shows mild leukocytosis 11.2 with hemoglobin decreased at 8.6, was 10.1 4 days ago   Imaging Studies ordered:  I ordered imaging studies including CT head, CT cervical spine chest x-ray I  independently reviewed with scope of interpretation limited to determining acute life threatening conditions related to emergency care: CT head shows no intracranial hemorrhage, CT C-spine shows no acute fracture or traumatic malalignment, chest x-ray shows no pulmonary edema or infiltrate.  Noted persistent right proximal humerus fracture I agree with the radiologist interpretation   Cardiac Monitoring: / EKG:  The patient was maintained on a cardiac monitor.  I personally viewed and interpreted the cardiac  monitored which showed an underlying rhythm of: Sinus rhythm   Consultations Obtained:  Dr. Earmon Phoenix, Dr. Shon Hale  Problem List / ED Course / Critical interventions / Medication management Syncope-patient had witnessed syncope by her brother-no postictal period, no reports of convulsions or clonus, no tongue biting or incontinence.  Dizziness or palpitations or other symptoms preceding this episode.  No chest pain, shortness of breath, hypoxia or tachycardia to suggest there is a PE as a cause of syncope.  Initial SpO2 had been recorded at 74% but likely an error, has remained 100% on room air throughout her stay. Labs show 1.5 g/dL drop in her hemoglobin over the past 4 days.  Rectal exam reveals brown stool with positive Hemoccult.  Abdomen is soft and nontender, CT head and C-spine reassuring with no acute injuries, chest x-ray shows her persistent right proximal humerus fracture.  Denies any new pain in this area no numbness or tingling, radial pulses intact.  She is still wearing her shoulder immobilizer.  Discussed with Dr. Levon Hedger from GI who is agreeable with patient coming and he will consult regarding the anemia and positive Hemoccult, discussed with Dr. Shon Hale for admission for the syncope.   Noted to be alcoholic, sodium chronically low, 124 today, slightly lower than a couple of days ago.  She does drink beer almost daily.  This is likely contributing to her hyponatremia.  He appears to be euvolemic, so I did not give any normal saline.  No confusion or acute gait changes from her baseline per family so do not feel this is an acute issue that needs to be addressed with hypertonic saline.   I have reviewed the patients home medicines and have made adjustments as needed   Social Determinants of Health:  Patient drinks two [employer] 45 12 oz cans daily, occasional liquor.  Patient states she had not drank in about a month but her sister Lura Em, states she drinks most days  still currently.       Amount and/or Complexity of Data Reviewed External Data Reviewed: labs, radiology and notes. Labs: ordered. Radiology: ordered. ECG/medicine tests: ordered.  Risk OTC drugs. Prescription drug management. Decision regarding hospitalization.           Final Clinical Impression(s) / ED Diagnoses Final diagnoses:  Syncope and collapse  Anemia, unspecified type    Rx / DC Orders ED Discharge Orders     None         Josem Kaufmann 04/24/23 1509    Glendora Score, MD 04/24/23 [phone]

## 2023-04-24 NOTE — ED Notes (Signed)
ED TO INPATIENT HANDOFF REPORT  ED Nurse Name and Phone #: [phone]  S Name/Age/Gender Judy Davis 67 y.o. female Room/Bed: APA10/APA10  Code Status   Code Status: Prior  Home/SNF/Other Home Patient oriented to: self, place, time, and situation Is this baseline? Yes   Triage Complete: Triage complete  Chief Complaint Syncope and collapse [R55]  Triage Note Pt brought in by [facility] for a fall. Pt was talking to her brother in the store "and just fell." Pt states just hitting her head no LOC and rt arm pain but "previously hurt" before fall.    Allergies No Known Allergies  Level of Care/Admitting Diagnosis ED Disposition     ED Disposition  Admit   Condition  --   Comment  Hospital Area: [facility] [[id number]]  Level of Care: Telemetry [5]  Covid Evaluation: Asymptomatic - no recent exposure (last 10 days) testing not required  Diagnosis: Syncope and collapse [780.2.ICD-9-CM]  Admitting Physician: Marylyn Ishihara  Attending Physician: Marylyn Ishihara  Certification:: I certify this patient will need inpatient services for at least 2 midnights  Estimated Length of Stay: 3          B Medical/Surgery History Past Medical History:  Diagnosis Date   ETOH abuse    HTN (hypertension)    Hyperlipidemia    Hypothyroidism    Past Surgical History:  Procedure Laterality Date   ABDOMINAL HYSTERECTOMY  1970's   COLONOSCOPY WITH PROPOFOL N/A 02/05/2021   Procedure: COLONOSCOPY WITH PROPOFOL;  Surgeon: Lanelle Bal, DO;  [facility];  Service: Endoscopy;  Laterality: N/A;  AM   COLONOSCOPY WITH PROPOFOL N/A 08/19/2021   Procedure: COLONOSCOPY WITH PROPOFOL;  Surgeon: Lanelle Bal, DO;  [facility];  Service: Endoscopy;  Laterality: N/A;  9:00am   ESOPHAGOGASTRODUODENOSCOPY (EGD) WITH PROPOFOL N/A 08/09/2019   Fields: LA Grade C esophagitis. gastritis. no h.pylori   POLYPECTOMY  08/19/2021   Procedure:  POLYPECTOMY;  Surgeon: Lanelle Bal, DO;  [facility];  Service: Endoscopy;;   TOTAL HIP ARTHROPLASTY Left 04/23/2021   Procedure: LEFT TOTAL HIP ARTHROPLASTY ANTERIOR APPROACH;  Surgeon: Cammy Copa, MD;  Location: [facility] OR;  Service: Orthopedics;  Laterality: Left;     A IV Location/Drains/Wounds Patient Lines/Drains/Airways Status     Active Line/Drains/Airways     Name Placement date Placement time Site Days   Peripheral IV 04/24/23 20 G 1.88" Anterior;Left;Upper Arm 04/24/23  1440  Arm  less than 1            Intake/Output Last 24 hours No intake or output data in the 24 hours ending 04/24/23 1535  Labs/Imaging Results for orders placed or performed during the hospital encounter of 04/24/23 (from the past 48 hour(s))  CBC WITH DIFFERENTIAL     Status: Abnormal   Collection Time: 04/24/23 10:30 AM  Result Value Ref Range   WBC 11.2 (H) 4.0 - 10.5 K/uL   RBC 2.83 (L) 3.87 - 5.11 MIL/uL   Hemoglobin 8.6 (L) 12.0 - 15.0 g/dL   HCT 54.0 (L) 98.1 - 19.1 %   MCV 91.9 80.0 - 100.0 fL   MCH 30.4 26.0 - 34.0 pg   MCHC 33.1 30.0 - 36.0 g/dL   RDW 47.8 29.5 - 62.1 %   Platelets 355 150 - 400 K/uL   nRBC 0.0 0.0 - 0.2 %   Neutrophils Relative % 81 %   Neutro Abs 9.0 (H) 1.7 - 7.7 K/uL  Lymphocytes Relative 10 %   Lymphs Abs 1.2 0.7 - 4.0 K/uL   Monocytes Relative 8 %   Monocytes Absolute 0.9 0.1 - 1.0 K/uL   Eosinophils Relative 0 %   Eosinophils Absolute 0.0 0.0 - 0.5 K/uL   Basophils Relative 0 %   Basophils Absolute 0.1 0.0 - 0.1 K/uL   Immature Granulocytes 1 %   Abs Immature Granulocytes 0.08 (H) 0.00 - 0.07 K/uL    Comment: Performed at [facility], [address], [place], [place] [address]  Comprehensive metabolic panel     Status: Abnormal   Collection Time: 04/24/23 10:30 AM  Result Value Ref Range   Sodium 124 (L) 135 - 145 mmol/L   Potassium 3.5 3.5 - 5.1 mmol/L   Chloride 90 (L) 98 - 111 mmol/L   CO2 23 22 - 32 mmol/L   Glucose, Bld  123 (H) 70 - 99 mg/dL    Comment: Glucose reference range applies only to samples taken after fasting for at least 8 hours.   BUN 14 8 - 23 mg/dL   Creatinine, Ser 7.82 0.44 - 1.00 mg/dL   Calcium 9.1 8.9 - 95.6 mg/dL   Total Protein 8.1 6.5 - 8.1 g/dL   Albumin 4.0 3.5 - 5.0 g/dL   AST 29 15 - 41 U/L   ALT 16 0 - 44 U/L   Alkaline Phosphatase 61 38 - 126 U/L   Total Bilirubin 1.1 0.3 - 1.2 mg/dL   GFR, Estimated >21 >30 mL/min    Comment: (NOTE) Calculated using the CKD-EPI Creatinine Equation (2021)    Anion gap 11 5 - 15    Comment: Performed at [facility], [address] [place], [place] [address]  Ethanol     Status: None   Collection Time: 04/24/23 10:30 AM  Result Value Ref Range   Alcohol, Ethyl (B) <10 <10 mg/dL    Comment: (NOTE) Lowest detectable limit for serum alcohol is 10 mg/dL.  For medical purposes only. Performed at [facility], [address], [place], [place] [address]   Troponin I (High Sensitivity)     Status: None   Collection Time: 04/24/23 10:30 AM  Result Value Ref Range   Troponin I (High Sensitivity) 3 <18 ng/L    Comment: (NOTE) Elevated high sensitivity troponin I (hsTnI) values and significant  changes across serial measurements may suggest ACS but many other  chronic and acute conditions are known to elevate hsTnI results.  Refer to the "Links" section for chest pain algorithms and additional  guidance. Performed at [facility], [address] [place], [place] [address]   CBG monitoring, ED     Status: Abnormal   Collection Time: 04/24/23 10:31 AM  Result Value Ref Range   Glucose-Capillary 105 (H) 70 - 99 mg/dL    Comment: Glucose reference range applies only to samples taken after fasting for at least 8 hours.  Troponin I (High Sensitivity)     Status: None   Collection Time: 04/24/23 12:50 PM  Result Value Ref Range   Troponin I (High Sensitivity) 3 <18 ng/L    Comment: (NOTE) Elevated high sensitivity troponin I (hsTnI) values  and significant  changes across serial measurements may suggest ACS but many other  chronic and acute conditions are known to elevate hsTnI results.  Refer to the "Links" section for chest pain algorithms and additional  guidance. Performed at [facility], [address], [place], [place] [address]   Iron and TIBC  Status: Abnormal   Collection Time: 04/24/23 12:50 PM  Result Value Ref Range   Iron 32 28 - 170 ug/dL   TIBC 474 259 - 563 ug/dL   Saturation Ratios 10 (L) 10.4 - 31.8 %   UIBC 289 ug/dL    Comment: Performed at [facility], [address], [place], [place] [address]  Ferritin     Status: None   Collection Time: 04/24/23 12:50 PM  Result Value Ref Range   Ferritin 154 11 - 307 ng/mL    Comment: Performed at [facility], [address], [place], [place] [address]  POC occult blood, ED Provider will collect     Status: None   Collection Time: 04/24/23  1:17 PM  Result Value Ref Range   Fecal Occult Blood Positive   Urinalysis, Routine w reflex microscopic -Urine, Clean Catch     Status: Abnormal   Collection Time: 04/24/23  2:15 PM  Result Value Ref Range   Color, Urine YELLOW YELLOW   APPearance CLEAR CLEAR   Specific Gravity, Urine 1.009 1.005 - 1.030   pH 5.0 5.0 - 8.0   Glucose, UA NEGATIVE NEGATIVE mg/dL   Hgb urine dipstick SMALL (A) NEGATIVE   Bilirubin Urine NEGATIVE NEGATIVE   Ketones, ur 20 (A) NEGATIVE mg/dL   Protein, ur NEGATIVE NEGATIVE mg/dL   Nitrite POSITIVE (A) NEGATIVE   Leukocytes,Ua TRACE (A) NEGATIVE   RBC / HPF 0-5 0 - 5 RBC/hpf   WBC, UA 0-5 0 - 5 WBC/hpf   Bacteria, UA MANY (A) NONE SEEN   Squamous Epithelial / HPF 0-5 0 - 5 /HPF   Mucus PRESENT     Comment: Performed at [facility], [address] [place], [place] [address]   CT CERVICAL SPINE WO CONTRAST  Result Date: 04/24/2023 CLINICAL DATA:  Trauma, fall EXAM: CT CERVICAL SPINE WITHOUT CONTRAST TECHNIQUE: Multidetector CT imaging of the cervical spine was performed without  intravenous contrast. Multiplanar CT image reconstructions were also generated. RADIATION DOSE REDUCTION: This exam was performed according to the departmental dose-optimization program which includes automated exposure control, adjustment of the mA and/or kV according to patient size and/or use of iterative reconstruction technique. COMPARISON:  03/18/2018 FINDINGS: Alignment: Alignment of posterior margins of vertebral bodies appears normal. Skull base and vertebrae: No recent fracture is seen. Degenerative changes are noted with bony spurs at multiple levels, more prominent at C5-C6 and C6-C7 levels. Soft tissues and spinal canal: There is extrinsic pressure over the ventral margin of thecal sac caused by posterior bony spurs at C5-C6 level extending more to the right. Disc levels: There is encroachment of neural foramina by bony spurs from C3-C7 levels. Upper chest: Scarring is seen in the apices of both lungs. Other: Scattered arterial calcifications are seen. IMPRESSION: No recent fracture is seen in cervical spine. Cervical spondylosis with encroachment of neural foramina from C3 to C7 levels, more severe at C5-C6 level. Electronically Signed   By: Ernie Avena M.D.   On: 04/24/2023 13:21   CT HEAD WO CONTRAST  Result Date: 04/24/2023 CLINICAL DATA:  Trauma, fall, syncope EXAM: CT HEAD WITHOUT CONTRAST TECHNIQUE: Contiguous axial images were obtained from the base of the skull through the vertex without intravenous contrast. RADIATION DOSE REDUCTION: This exam was performed according to the departmental dose-optimization program which includes automated exposure control, adjustment of the mA and/or kV according to patient size and/or use of iterative reconstruction technique. COMPARISON:  04/20/2023 FINDINGS: Brain: No acute intracranial findings are seen. There  are no signs of bleeding within the cranium. Calcifications are seen in basal ganglia. Cortical sulci are prominent. There is decreased  density in periventricular and subcortical white matter. Vascular: Unremarkable. Skull: No fracture is seen in calvarium. Sinuses/Orbits: There are no air-fluid levels or significant mucosal thickening. Old blowout fracture is seen in the floor of the left orbit which has not changed in comparison with the earlier examination done on 03/18/2018. Other: None. IMPRESSION: No acute intracranial findings are seen in noncontrast CT brain. Atrophy. Small-vessel disease. Old blowout fracture is seen in the floor of left orbit. Electronically Signed   By: Ernie Avena M.D.   On: 04/24/2023 13:14   DG Chest 1 View  Result Date: 04/24/2023 CLINICAL DATA:  Syncope.  Fall EXAM: CHEST  1 VIEW COMPARISON:  04/21/2021 FINDINGS: Minimal linear opacity at the lung bases likely scar or atelectasis. No consolidation. Normal cardiopericardial silhouette. No edema, pneumothorax or effusion. Known comminuted right humeral neck fracture. Please correlate with the recent shoulder x-ray of 04/20/2023. IMPRESSION: Slight basilar atelectasis.  No pneumothorax or effusion. Known right proximal humeral fracture Electronically Signed   By: Karen Kays M.D.   On: 04/24/2023 12:51    Pending Labs Unresulted Labs (From admission, onward)    None       Vitals/Pain Today's Vitals   04/24/23 0937 04/24/23 0938 04/24/23 0945 04/24/23 1506  BP:   138/69   Pulse:   77   Resp:      Temp:      TempSrc:      SpO2:   100%   Weight:  54.2 kg    Height:  5\' 6"  (1.676 m)    PainSc: 8    7     Isolation Precautions No active isolations  Medications Medications  pantoprazole (PROTONIX) injection 40 mg (40 mg Intravenous Given 04/24/23 1457)  LORazepam (ATIVAN) tablet 1-4 mg (has no administration in time range)    Or  LORazepam (ATIVAN) injection 1-4 mg (has no administration in time range)  thiamine (VITAMIN B1) tablet 100 mg (has no administration in time range)    Or  thiamine (VITAMIN B1) injection 100 mg (has no  administration in time range)  folic acid (FOLVITE) tablet 1 mg (has no administration in time range)  multivitamin with minerals tablet 1 tablet (has no administration in time range)  diazepam (VALIUM) tablet 2 mg (has no administration in time range)  ondansetron (ZOFRAN) injection 4 mg (has no administration in time range)  thiamine (VITAMIN B1) tablet 100 mg (100 mg Oral Given 04/24/23 1033)    Mobility non-ambulatory     Focused Assessments    R Recommendations: See Admitting Provider Note  Report given to:   Additional Notes:

## 2023-04-24 NOTE — H&P (Signed)
Patient Demographics:    Judy Davis, is a 67 y.o. female  MRN: [id number]   DOB - 27-Dec-1955  Admit Date - 04/24/2023  Outpatient Primary MD for the patient is [facility]   Assessment & Plan:   Assessment and Plan:  1)Syncope- recurrent falls in the setting of alcohol abuse, --Troponin is 3, repeat troponin is still 3, -EKG sinus without acute findings --admit to telemetry monitored unit, watch for arrhythmias, check serial troponins and EKG for rule out ACS protocol, check echocardiogram to rule out significant aortic stenosis or other outflow obstruction, and also to evaluate EF and to rule out segmental/Regional wall motion abnormalities. Check carotid artery Dopplers to rule out hemodynamically significant stenosis -CT C-spine without acute fractures, patient does not have cervical spondylosis with encroachment of the neural foramina from C3-C7 levels most severe at C5-C6- -CT of the head without acute findings patient does have atrophy and small vessel disease, old blowout fracture is seen in the floor of the left orbit --She was seen in the ED on 04/20/2023 after passing out and sustaining fractures of the right proximal humerus and left clavicle   2) acute on chronic anemia=---Hgb is down to 8.6 , Hgb was 10.1 on 04/20/2023 -Stool occult blood is positive in the ER -GI consult appreciated --Ferritin is 154, serum iron is low normal at 32 iron saturation is low at 10 TIBC 321 -Folate and B12 requested -IV Protonix for now -Defer timing of possible EGD to GI team  3)Etoh Abuse-- BAL less than 10 --she drinks two [employer]t 45 12 oz cans daily, occasional liquor  -- High risk for DTs, lorazepam per CIWA protocol -Thiamine and folic acid as ordered  4) chronic  hyponatremia/hypochloremia--suspect alcohol abuse related -Sodium is chronically low at 124 which is not far from her baseline which usually ranges from 124 to 1 28, low at this 90 which is also close to baseline, -Gentle hydration with normal saline -Monitor closely  5) hypothyroidism--continue levothyroxine, check TSH  6) right proximal humerus fracture/left clavicular fracture--diagnosed on 04/20/2023 after patient fell -Continue to use sling -Outpatient follow-up with orthopedic surgery advised  7)HTN--amlodipine and metoprolol as ordered  Status is: Inpatient  Remains inpatient appropriate because:   Dispo: The patient is from: Home              Anticipated d/c is to: Home              Anticipated d/c date is: 2 days              Patient currently is not medically stable to d/c. Barriers: Not Clinically Stable-    With History of - Reviewed by me  Past Medical History:  Diagnosis Date   ETOH abuse    HTN (hypertension)    Hyperlipidemia    Hypothyroidism       Past Surgical History:  Procedure Laterality Date   ABDOMINAL HYSTERECTOMY  1970's  COLONOSCOPY WITH PROPOFOL N/A 02/05/2021   Procedure: COLONOSCOPY WITH PROPOFOL;  Surgeon: Lanelle Bal, DO;  [facility]: [facility];  Service: Endoscopy;  Laterality: N/A;  AM   COLONOSCOPY WITH PROPOFOL N/A 08/19/2021   Procedure: COLONOSCOPY WITH PROPOFOL;  Surgeon: Lanelle Bal, DO;  Location: [facility];  Service: Endoscopy;  Laterality: N/A;  9:00am   ESOPHAGOGASTRODUODENOSCOPY (EGD) WITH PROPOFOL N/A 08/09/2019   Fields: LA Grade C esophagitis. gastritis. 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 alcohol abuse, recurrent falls in the setting of alcohol abuse, HTN, hypothyroidism and HLD presents to the ED after apparently passing out while talking to her brother--- -patient remembers standing and talking to her brother She remembers is waking up on the floor but she does not know how she ended up on the floor -Poor recall of events -She was apparently not postictal and no tongue biting, no incontinence -No headaches no chest pains palpitations dizziness shortness of breath or other prodromal symptoms before or after passing out -she drinks two Colt 45 12 oz cans daily, occasional liquor  -She was seen in the ED on 04/20/2023 after passing out and sustaining fractures of the right proximal humerus and left clavicle -CT C-spine without acute fractures, patient does not have cervical spondylosis with encroachment of the neural foramina from C3-C7 levels most severe at C5-C6- -CT of the head without acute findings patient does have atrophy and small vessel disease, old blowout fracture is seen in the floor of the left orbit -Chest x-ray without acute findings -Hgb is down to 8.6 , Hgb was 10.1 on 04/20/2023 -Stool occult blood is positive in the ER -Sodium is chronically low at 124 which is not far from her baseline which usually ranges from 124 to 1 28, low at this 90 which is also close to baseline, potassium is 3.5 creatinine 0.71, LFTs are not elevated BAL less than 10 -Troponin is 3, repeat troponin is still 3, -EKG sinus without acute findings -Ferritin is 154, serum iron is low normal at 32 iron saturation is low at 10 TIBC 321 -Additional history obtained from patient's sister Patsy   Review of systems:    In addition to the HPI above,   A full Review of  Systems was done, all other systems reviewed are negative except as noted above in HPI  , .    Social History:  Reviewed by me    Social History   Tobacco Use   Smoking status: Never   Smokeless tobacco: Never  Substance Use Topics   Alcohol use: Yes    Comment: occ beer    Family History :  Reviewed by me    Family History  Problem Relation Age of Onset   Other Mother        Homicide   Cancer Father        possibly pancreatic   Colon cancer Neg Hx    Colon polyps Neg Hx  Home Medications:   Prior to Admission medications   Medication Sig Start Date End Date Taking? Authorizing Provider  amLODipine (NORVASC) 5 MG tablet Take 5 mg by mouth daily. 03/17/23  Yes [provider]  levothyroxine (SYNTHROID) 50 MCG tablet Take 50 mcg by mouth daily before breakfast. 03/17/23  Yes [provider]  lisinopril (ZESTRIL) 5 MG tablet Take 5 mg by mouth daily. 03/17/23  Yes [provider]  Metoprolol Tartrate 75 MG TABS Take 75 mg by mouth in the morning and at bedtime. 03/17/23  Yes [provider]  Multiple Vitamin (MULTIVITAMIN WITH MINERALS) TABS tablet Take 1 tablet by mouth daily. 04/29/21  Yes Rolly Salter, MD  oxyCODONE (ROXICODONE) 5 MG immediate release tablet Take 0.5 tablets (2.5 mg total) by mouth every 4 (four) hours as needed for severe pain. 04/20/23   Rondel Baton, MD     Allergies:    No Known Allergies   Physical Exam:   Vitals  Blood pressure (!) 142/73, pulse 85, temperature 98 F (36.7 C), temperature source Oral, resp. rate 20, height 5\' 6"  (1.676 m), weight 54.2 kg, SpO2 100%.  Physical Examination: General appearance - alert,  in no distress  Mental status - alert, oriented to person, place, and time,  Eyes - sclera anicteric Neck - supple, no JVD elevation , Chest - clear  to auscultation bilaterally, symmetrical air movement,  Heart - S1 and S2 normal, regular  Abdomen - soft, nontender, nondistended, +BS Neurological - screening mental status exam normal, neck supple without rigidity,  cranial nerves II through XII intact, DTR's normal and symmetric Extremities - no pedal edema noted, intact peripheral pulses  Skin - warm, dry MSK-right upper extremity is in a sling     Data Review:    CBC Recent Labs  Lab 04/20/23 0935 04/24/23 1030  WBC 10.3 11.2*  HGB 10.1* 8.6*  HCT 31.0* 26.0*  PLT 325 355  MCV 92.0 91.9  MCH 30.0 30.4  MCHC 32.6 33.1  RDW 13.3 12.9  LYMPHSABS  --  1.2  MONOABS  --  0.9  EOSABS  --  0.0  BASOSABS  --  0.1   ------------------------------------------------------------------------------------------------------------------  Chemistries  Recent Labs  Lab 04/20/23 0935 04/24/23 1030  NA 127* 124*  K 4.1 3.5  CL 97* 90*  CO2 20* 23  GLUCOSE 129* 123*  BUN 14 14  CREATININE 0.93 0.71  CALCIUM 8.9 9.1  AST 37 29  ALT 20 16  ALKPHOS 75 61  BILITOT 1.4* 1.1   ------------------------------------------------------------------------------------------------------------------ estimated creatinine clearance is 57.6 mL/min (by C-G formula based on SCr of 0.71 mg/dL). ------------------------------------------------------------------------------------------------------------------  Coagulation profile Recent Labs  Lab 04/20/23 0935  INR 1.2   ---------------------------------------------------------------------------------------------------------------------------------------------------------------------------------------------------------------------------------  Urinalysis    Component Value Date/Time   COLORURINE YELLOW 04/24/2023 1415   APPEARANCEUR CLEAR 04/24/2023 1415   LABSPEC 1.009 04/24/2023 1415   PHURINE 5.0 04/24/2023 1415   GLUCOSEU NEGATIVE 04/24/2023 1415   HGBUR SMALL (A) 04/24/2023 1415   BILIRUBINUR NEGATIVE 04/24/2023 1415   KETONESUR 20 (A) 04/24/2023 1415   PROTEINUR NEGATIVE 04/24/2023 1415   UROBILINOGEN 0.2 05/10/2015 1000   NITRITE POSITIVE (A) 04/24/2023 1415   LEUKOCYTESUR TRACE (A)  04/24/2023 1415    ----------------------------------------------------------------------------------------------------------------   Imaging Results:    CT CERVICAL SPINE WO CONTRAST  Result Date: 04/24/2023 CLINICAL DATA:  Trauma, fall EXAM: CT CERVICAL SPINE WITHOUT CONTRAST TECHNIQUE: Multidetector CT imaging of the cervical spine was performed without intravenous contrast. Multiplanar CT  image reconstructions were also generated. RADIATION DOSE REDUCTION: This exam was performed according to the departmental dose-optimization program which includes automated exposure control, adjustment of the mA and/or kV according to patient size and/or use of iterative reconstruction technique. COMPARISON:  03/18/2018 FINDINGS: Alignment: Alignment of posterior margins of vertebral bodies appears normal. Skull base and vertebrae: No recent fracture is seen. Degenerative changes are noted with bony spurs at multiple levels, more prominent at C5-C6 and C6-C7 levels. Soft tissues and spinal canal: There is extrinsic pressure over the ventral margin of thecal sac caused by posterior bony spurs at C5-C6 level extending more to the right. Disc levels: There is encroachment of neural foramina by bony spurs from C3-C7 levels. Upper chest: Scarring is seen in the apices of both lungs. Other: Scattered arterial calcifications are seen. IMPRESSION: No recent fracture is seen in cervical spine. Cervical spondylosis with encroachment of neural foramina from C3 to C7 levels, more severe at C5-C6 level. Electronically Signed   By: Ernie Avena M.D.   On: 04/24/2023 13:21   CT HEAD WO CONTRAST  Result Date: 04/24/2023 CLINICAL DATA:  Trauma, fall, syncope EXAM: CT HEAD WITHOUT CONTRAST TECHNIQUE: Contiguous axial images were obtained from the base of the skull through the vertex without intravenous contrast. RADIATION DOSE REDUCTION: This exam was performed according to the departmental dose-optimization program which  includes automated exposure control, adjustment of the mA and/or kV according to patient size and/or use of iterative reconstruction technique. COMPARISON:  04/20/2023 FINDINGS: Brain: No acute intracranial findings are seen. There are no signs of bleeding within the cranium. Calcifications are seen in basal ganglia. Cortical sulci are prominent. There is decreased density in periventricular and subcortical white matter. Vascular: Unremarkable. Skull: No fracture is seen in calvarium. Sinuses/Orbits: There are no air-fluid levels or significant mucosal thickening. Old blowout fracture is seen in the floor of the left orbit which has not changed in comparison with the earlier examination done on 03/18/2018. Other: None. IMPRESSION: No acute intracranial findings are seen in noncontrast CT brain. Atrophy. Small-vessel disease. Old blowout fracture is seen in the floor of left orbit. Electronically Signed   By: Ernie Avena M.D.   On: 04/24/2023 13:14   DG Chest 1 View  Result Date: 04/24/2023 CLINICAL DATA:  Syncope.  Fall EXAM: CHEST  1 VIEW COMPARISON:  04/21/2021 FINDINGS: Minimal linear opacity at the lung bases likely scar or atelectasis. No consolidation. Normal cardiopericardial silhouette. No edema, pneumothorax or effusion. Known comminuted right humeral neck fracture. Please correlate with the recent shoulder x-ray of 04/20/2023. IMPRESSION: Slight basilar atelectasis.  No pneumothorax or effusion. Known right proximal humeral fracture Electronically Signed   By: Karen Kays M.D.   On: 04/24/2023 12:51    Radiological Exams on Admission: CT CERVICAL SPINE WO CONTRAST  Result Date: 04/24/2023 CLINICAL DATA:  Trauma, fall EXAM: CT CERVICAL SPINE WITHOUT CONTRAST TECHNIQUE: Multidetector CT imaging of the cervical spine was performed without intravenous contrast. Multiplanar CT image reconstructions were also generated. RADIATION DOSE REDUCTION: This exam was performed according to the  departmental dose-optimization program which includes automated exposure control, adjustment of the mA and/or kV according to patient size and/or use of iterative reconstruction technique. COMPARISON:  03/18/2018 FINDINGS: Alignment: Alignment of posterior margins of vertebral bodies appears normal. Skull base and vertebrae: No recent fracture is seen. Degenerative changes are noted with bony spurs at multiple levels, more prominent at C5-C6 and C6-C7 levels. Soft tissues and spinal canal: There is extrinsic pressure over the  ventral margin of thecal sac caused by posterior bony spurs at C5-C6 level extending more to the right. Disc levels: There is encroachment of neural foramina by bony spurs from C3-C7 levels. Upper chest: Scarring is seen in the apices of both lungs. Other: Scattered arterial calcifications are seen. IMPRESSION: No recent fracture is seen in cervical spine. Cervical spondylosis with encroachment of neural foramina from C3 to C7 levels, more severe at C5-C6 level. Electronically Signed   By: Ernie Avena M.D.   On: 04/24/2023 13:21   CT HEAD WO CONTRAST  Result Date: 04/24/2023 CLINICAL DATA:  Trauma, fall, syncope EXAM: CT HEAD WITHOUT CONTRAST TECHNIQUE: Contiguous axial images were obtained from the base of the skull through the vertex without intravenous contrast. RADIATION DOSE REDUCTION: This exam was performed according to the departmental dose-optimization program which includes automated exposure control, adjustment of the mA and/or kV according to patient size and/or use of iterative reconstruction technique. COMPARISON:  04/20/2023 FINDINGS: Brain: No acute intracranial findings are seen. There are no signs of bleeding within the cranium. Calcifications are seen in basal ganglia. Cortical sulci are prominent. There is decreased density in periventricular and subcortical white matter. Vascular: Unremarkable. Skull: No fracture is seen in calvarium. Sinuses/Orbits: There are no  air-fluid levels or significant mucosal thickening. Old blowout fracture is seen in the floor of the left orbit which has not changed in comparison with the earlier examination done on 03/18/2018. Other: None. IMPRESSION: No acute intracranial findings are seen in noncontrast CT brain. Atrophy. Small-vessel disease. Old blowout fracture is seen in the floor of left orbit. Electronically Signed   By: Ernie Avena M.D.   On: 04/24/2023 13:14   DG Chest 1 View  Result Date: 04/24/2023 CLINICAL DATA:  Syncope.  Fall EXAM: CHEST  1 VIEW COMPARISON:  04/21/2021 FINDINGS: Minimal linear opacity at the lung bases likely scar or atelectasis. No consolidation. Normal cardiopericardial silhouette. No edema, pneumothorax or effusion. Known comminuted right humeral neck fracture. Please correlate with the recent shoulder x-ray of 04/20/2023. IMPRESSION: Slight basilar atelectasis.  No pneumothorax or effusion. Known right proximal humeral fracture Electronically Signed   By: Karen Kays M.D.   On: 04/24/2023 12:51    DVT Prophylaxis -SCD/Gi bleed AM Labs Ordered, also please review Full Orders  Family Communication: Admission, patients condition and plan of care including tests being ordered have been discussed with the patient and sister Patsy who indicate understanding and agree with the plan   Condition -stable  Shon Hale M.D on 04/24/2023 at 7:20 PM Go to [email or web address] -  for contact info  [facility] - Office  [phone]

## 2023-04-24 NOTE — Consult Note (Addendum)
Gastroenterology Consult   Referring Provider: No ref. provider found Primary Care Physician:  [facility] Primary Gastroenterologist:  Hennie Duos. Marletta Lor, DO  Patient ID: Judy Davis; [id number]; [birth year]  Admit date: 04/24/2023  LOS: 0 days   Date of Consultation: 04/24/2023 Reason for Consultation:  acute on chronic anemia, hemoccult positive stool    History of Present Illness   [name] is a 67 y.o. female with h/o etoh abuse, HTN, hyperlipidemia, hypothyroidism, with recent ED evaluation for right humerus and left clavicle fractures, who presents to the ED after syncopal episode. GI consulted for acute on chronic anemia, hemoccult positive stool.   In the ED: Hgb 8.6. down from 10.1 four days ago when she presented to the ED. CXR, CT Head, CT cervical spine completed as outlined below.   History of chronic normocytic anemia. She had Hgb 10.9 11/2022. Iron studies in 07/2021 with normal serum iron of 67, TIBC 316, fe sat 21, ferritin 130.   Patient reports recent hematemesis in the last 48 hours and black stool today. Never had this before. Denies any significant abdominal pain, heartburn, dysphagia, constipation, diarrhea. No known cirrhosis. Last liver imaging in 2021. No thrombocytopenia. She states she drinks etoh about once weekly at this time but has history of etoh abuse. Sister reports that patient drinks most days.   Colonoscopy 08/2021: -non-bleeding internal hemorrhoids -diverticulosis sigmoid colon -one 10mm polyp in descending colon, tubular adenoma -next colonoscopy five years.   EGD 07/2019: -LA Grade C esophagitis -gastritis, bx with mild chronic gastritis and focal intestinal metaplasia but no h.pylori   Prior to Admission medications   Medication Sig Start Date End Date Taking? Authorizing Provider  aspirin EC 325 MG tablet Take 325 mg by mouth every 4 (four) hours as needed for moderate pain.    [provider]   Multiple Vitamin (MULTIVITAMIN WITH MINERALS) TABS tablet Take 1 tablet by mouth daily. 04/29/21   Rolly Salter, MD  Multiple Vitamin (MULTIVITAMIN WITH MINERALS) TABS tablet Take 1 tablet by mouth 2 (two) times daily.    [provider]  oxyCODONE (ROXICODONE) 5 MG immediate release tablet Take 0.5 tablets (2.5 mg total) by mouth every 4 (four) hours as needed for severe pain. 04/20/23   Rondel Baton, MD    Current Facility-Administered Medications  Medication Dose Route Frequency Provider Last Rate Last Admin   pantoprazole (PROTONIX) injection 40 mg  40 mg Intravenous Once Carmel [place] A, PA-C       Current Outpatient Medications  Medication Sig Dispense Refill   aspirin EC 325 MG tablet Take 325 mg by mouth every 4 (four) hours as needed for moderate pain.     Multiple Vitamin (MULTIVITAMIN WITH MINERALS) TABS tablet Take 1 tablet by mouth daily. 30 tablet 0   Multiple Vitamin (MULTIVITAMIN WITH MINERALS) TABS tablet Take 1 tablet by mouth 2 (two) times daily.     oxyCODONE (ROXICODONE) 5 MG immediate release tablet Take 0.5 tablets (2.5 mg total) by mouth every 4 (four) hours as needed for severe pain. 15 tablet 0    Allergies as of 04/24/2023   (No Known Allergies)    Past Medical History:  Diagnosis Date   ETOH abuse    HTN (hypertension)    Hyperlipidemia    Hypothyroidism     Past Surgical History:  Procedure Laterality Date   ABDOMINAL HYSTERECTOMY  1970's   COLONOSCOPY WITH PROPOFOL N/A 02/05/2021   Procedure: COLONOSCOPY WITH PROPOFOL;  Surgeon: Lanelle Bal, DO;  [facility];  Service: Endoscopy;  Laterality: N/A;  AM   COLONOSCOPY WITH PROPOFOL N/A 08/19/2021   Procedure: COLONOSCOPY WITH PROPOFOL;  Surgeon: Lanelle Bal, DO;  Location: [facility];  Service: Endoscopy;  Laterality: N/A;  9:00am   ESOPHAGOGASTRODUODENOSCOPY (EGD) WITH PROPOFOL N/A 08/09/2019   Fields: LA Grade C esophagitis. gastritis. no h.pylori    POLYPECTOMY  08/19/2021   Procedure: POLYPECTOMY;  Surgeon: Lanelle Bal, DO;  Location: [facility];  Service: Endoscopy;;   TOTAL HIP ARTHROPLASTY Left 04/23/2021   Procedure: LEFT TOTAL HIP ARTHROPLASTY ANTERIOR APPROACH;  Surgeon: Cammy Copa, MD;  Location: [facility] OR;  Service: Orthopedics;  Laterality: Left;    Family History  Problem Relation Age of Onset   Other Mother        Homicide   Cancer Father        possibly pancreatic   Colon cancer Neg Hx    Colon polyps Neg Hx     Social History   Socioeconomic History   Marital status: Married    Spouse name: Not on file   Number of children: Not on file   Years of education: Not on file   Highest education level: Not on file  Occupational History   Occupation: unemployed  Tobacco Use   Smoking status: Never   Smokeless tobacco: Never  Vaping Use   Vaping status: Never Used  Substance and Sexual Activity   Alcohol use: Yes    Comment: occ beer   Drug use: No   Sexual activity: Not on file  Other Topics Concern   Not on file  Social History Narrative   Not on file   Social Determinants of Health   Financial Resource Strain: Not on file  Food Insecurity: Not on file  Transportation Needs: Not on file  Physical Activity: Not on file  Stress: Not on file  Social Connections: Not on file  Intimate Partner Violence: Not on file     Review of System:   General: Negative for anorexia, weight loss, fever, chills, fatigue, +weakness. Eyes: Negative for vision changes.  ENT: Negative for hoarseness, difficulty swallowing , nasal congestion. CV: Negative for chest pain, angina, palpitations, dyspnea on exertion, peripheral edema.  Respiratory: Negative for dyspnea at rest, dyspnea on exertion, cough, sputum, wheezing.  GI: See history of present illness. GU:  Negative for dysuria, hematuria, urinary incontinence, urinary frequency, nocturnal urination.  MS: +left shoulder pain.  Derm: Negative for rash or  itching.  Neuro: Negative for weakness, abnormal sensation, seizure, frequent headaches, memory loss, confusion.  Psych: Negative for anxiety, depression, suicidal ideation, hallucinations.  Endo: Negative for unusual weight change.  Heme: Negative for bruising or bleeding. Allergy: Negative for rash or hives.      Physical Examination:   Vital signs in last 24 hours: Temp:  [98 F (36.7 C)] 98 F (36.7 C) (08/09 0936) Pulse Rate:  [77] 77 (08/09 0945) Resp:  [16] 16 (08/09 0936) BP: (138-144)/(69-71) 138/69 (08/09 0945) SpO2:  [74 %-100 %] 100 % (08/09 0945) Weight:  [54.2 kg] 54.2 kg (08/09 0938)    General: Well-nourished, well-developed in no acute distress.  Head: Normocephalic, atraumatic.   Eyes: Conjunctiva pink, no icterus. Mouth: Oropharyngeal mucosa moist and pink  Neck: Supple without thyromegaly, masses, or lymphadenopathy.  Lungs: Clear to auscultation bilaterally.  Heart: Regular rate and rhythm, no murmurs rubs or gallops.  Abdomen: Bowel sounds are normal, nontender, nondistended,  no hepatosplenomegaly or masses, no abdominal bruits or hernia , no rebound or guarding.   Rectal: not performed Extremities: No lower extremity edema, clubbing, deformity.  Neuro: Alert and oriented x 4 , grossly normal neurologically.  Skin: Warm and dry, no rash or jaundice.   Psych: Alert and cooperative, normal mood and affect.        Intake/Output from previous day: No intake/output data recorded. Intake/Output this shift: No intake/output data recorded.  Lab Results:   CBC Recent Labs    04/24/23 1030  WBC 11.2*  HGB 8.6*  HCT 26.0*  MCV 91.9  PLT 355   BMET Recent Labs    04/24/23 1030  NA 124*  K 3.5  CL 90*  CO2 23  GLUCOSE 123*  BUN 14  CREATININE 0.71  CALCIUM 9.1   LFT Recent Labs    04/24/23 1030  BILITOT 1.1  ALKPHOS 61  AST 29  ALT 16  PROT 8.1  ALBUMIN 4.0    Lipase No results for input(s): "LIPASE" in the last 72  hours.  PT/INR No results for input(s): "LABPROT", "INR" in the last 72 hours.   Hepatitis Panel No results for input(s): "HEPBSAG", "HCVAB", "HEPAIGM", "HEPBIGM" in the last 72 hours.   Imaging Studies:   CT CERVICAL SPINE WO CONTRAST  Result Date: 04/24/2023 CLINICAL DATA:  Trauma, fall EXAM: CT CERVICAL SPINE WITHOUT CONTRAST TECHNIQUE: Multidetector CT imaging of the cervical spine was performed without intravenous contrast. Multiplanar CT image reconstructions were also generated. RADIATION DOSE REDUCTION: This exam was performed according to the departmental dose-optimization program which includes automated exposure control, adjustment of the mA and/or kV according to patient size and/or use of iterative reconstruction technique. COMPARISON:  03/18/2018 FINDINGS: Alignment: Alignment of posterior margins of vertebral bodies appears normal. Skull base and vertebrae: No recent fracture is seen. Degenerative changes are noted with bony spurs at multiple levels, more prominent at C5-C6 and C6-C7 levels. Soft tissues and spinal canal: There is extrinsic pressure over the ventral margin of thecal sac caused by posterior bony spurs at C5-C6 level extending more to the right. Disc levels: There is encroachment of neural foramina by bony spurs from C3-C7 levels. Upper chest: Scarring is seen in the apices of both lungs. Other: Scattered arterial calcifications are seen. IMPRESSION: No recent fracture is seen in cervical spine. Cervical spondylosis with encroachment of neural foramina from C3 to C7 levels, more severe at C5-C6 level. Electronically Signed   By: Ernie Avena M.D.   On: 04/24/2023 13:21   CT HEAD WO CONTRAST  Result Date: 04/24/2023 CLINICAL DATA:  Trauma, fall, syncope EXAM: CT HEAD WITHOUT CONTRAST TECHNIQUE: Contiguous axial images were obtained from the base of the skull through the vertex without intravenous contrast. RADIATION DOSE REDUCTION: This exam was performed according  to the departmental dose-optimization program which includes automated exposure control, adjustment of the mA and/or kV according to patient size and/or use of iterative reconstruction technique. COMPARISON:  04/20/2023 FINDINGS: Brain: No acute intracranial findings are seen. There are no signs of bleeding within the cranium. Calcifications are seen in basal ganglia. Cortical sulci are prominent. There is decreased density in periventricular and subcortical white matter. Vascular: Unremarkable. Skull: No fracture is seen in calvarium. Sinuses/Orbits: There are no air-fluid levels or significant mucosal thickening. Old blowout fracture is seen in the floor of the left orbit which has not changed in comparison with the earlier examination done on 03/18/2018. Other: None. IMPRESSION: No acute intracranial findings are seen  in noncontrast CT brain. Atrophy. Small-vessel disease. Old blowout fracture is seen in the floor of left orbit. Electronically Signed   By: Ernie Avena M.D.   On: 04/24/2023 13:14   DG Chest 1 View  Result Date: 04/24/2023 CLINICAL DATA:  Syncope.  Fall EXAM: CHEST  1 VIEW COMPARISON:  04/21/2021 FINDINGS: Minimal linear opacity at the lung bases likely scar or atelectasis. No consolidation. Normal cardiopericardial silhouette. No edema, pneumothorax or effusion. Known comminuted right humeral neck fracture. Please correlate with the recent shoulder x-ray of 04/20/2023. IMPRESSION: Slight basilar atelectasis.  No pneumothorax or effusion. Known right proximal humeral fracture Electronically Signed   By: Karen Kays M.D.   On: 04/24/2023 12:51   DG Elbow Complete Right  Result Date: 04/20/2023 CLINICAL DATA:  Right elbow status post fall last night. Proximal right humeral head fracture. EXAM: RIGHT ELBOW - COMPLETE 3+ VIEW COMPARISON:  None Available. FINDINGS: Possible mild elevation of the distal anterior humeral fat pad, possible small elbow joint effusion. Minimal medial elbow  joint space narrowing and peripheral degenerative spurring. No acute fracture is seen. No dislocation. Mild-to-moderate atherosclerotic calcifications. IMPRESSION: 1. Possible small elbow joint effusion. No acute fracture is identified. If there is persistent clinical concern for a radiographically occult fracture, note is made CT would be more sensitive. 2. Minimal medial elbow joint osteoarthritis. Electronically Signed   By: Neita Garnet M.D.   On: 04/20/2023 12:51   DG Clavicle Left  Result Date: 04/20/2023 CLINICAL DATA:  Left chest bruising after fall last night. EXAM: LEFT CLAVICLE - 2+ VIEWS COMPARISON:  AP chest 04/21/2021 FINDINGS: There is vertical, obliqued linear fracture line lucency within the lateral shaft and adjacent head of the left clavicle. No significant displacement. The clavicular joint remains appropriately aligned. Mild glenohumeral joint space narrowing with moderate inferior glenoid degenerative osteophytosis. Mild atherosclerotic calcification within the aortic arch. Old healed fracture of the posterolateral left seventh rib. IMPRESSION: Nondisplaced acute fracture of the lateral shaft and head of the left clavicle. Electronically Signed   By: Neita Garnet M.D.   On: 04/20/2023 11:30   DG Shoulder Right  Result Date: 04/20/2023 CLINICAL DATA:  Right shoulder pain. Right arm pain starting last night when fell. EXAM: RIGHT SHOULDER - 2+ VIEW COMPARISON:  AP chest 04/21/2021 FINDINGS: There is mildly decreased bone mineralization. There is an acute, moderately impacted, and highly comminuted fracture involving the lateral humeral head and the humeral surgical neck. There is high-grade comminution at the inferolateral aspect of the humeral head. Up to approximately 6 mm anterior displacement of the distal fracture component with respect to the proximal fracture component. The humeral head remains appropriately located with respect to the glenoid fossa. Mild inferior glenoid  degenerative osteophytosis. Moderate lateral downsloping of the acromion. IMPRESSION: Acute, moderately impacted, and highly comminuted fracture involving the lateral humeral head and the humeral surgical neck. Electronically Signed   By: Neita Garnet M.D.   On: 04/20/2023 11:28   CT Head Wo Contrast  Result Date: 04/20/2023 CLINICAL DATA:  ams, etoh use EXAM: CT HEAD WITHOUT CONTRAST TECHNIQUE: Contiguous axial images were obtained from the base of the skull through the vertex without intravenous contrast. RADIATION DOSE REDUCTION: This exam was performed according to the departmental dose-optimization program which includes automated exposure control, adjustment of the mA and/or kV according to patient size and/or use of iterative reconstruction technique. COMPARISON:  None Available. FINDINGS: Brain: No evidence of acute infarction, hemorrhage, hydrocephalus, extra-axial collection or mass lesion/mass effect. Sequela  of moderate chronic microvascular ischemic change. Mineralization of the basal ganglia on the right. Vascular: No hyperdense vessel or unexpected calcification. Skull: Normal. Negative for fracture or focal lesion. Sinuses/Orbits: No middle ear or mastoid effusion. Paranasal sinuses are clear. Left lens replacement. Orbits are otherwise unremarkable. Other: None. IMPRESSION: No acute intracranial abnormality. Electronically Signed   By: Lorenza Cambridge M.D.   On: 04/20/2023 11:25  [4 week]  Assessment:   67 y/o female h/o etoh abuse, HTN, hyperlipidemia, hypothyroidism, with recent ED evaluation for right humerus and left clavicle fractures, who presents to the ED after syncopal episode. GI consulted for acute on chronic anemia, hemoccult positive stool.   Acute on chronic anemia/hemoccult positive stool: Baseline Hgb typically in the 10 range as outlined above. Colonoscopy completed in 08/2021 but EGD more remote. Patient reports episodes of hematemesis and melena. No overt GI bleeding since  presentation, stool hemoccult positive. Cannot rule out possible ugi bleed from gastritis, M-W tear, PUD as source of her acute change in Hgb. No recent liver imaging and no prior history of cirrhosis. Given normal platelets, less likely dealing with portal hypertension and/or variceal bleeding. Consider EGD for further evaluation.     Plan:   Follow up iron studies. Trend H/H.  IV pantoprazole BID. Consider EGD this admission.  I have discussed the risks, alternatives, benefits with regards to but not limited to the risk of reaction to medication, bleeding, infection, perforation and the patient is agreeable to proceed. Written consent to be obtained.    LOS: 0 days   We would like to thank you for the opportunity to participate in the care of [employer].  Leanna Battles. [facility] [phone] 8/9/20242:24 PM

## 2023-04-24 NOTE — ED Notes (Signed)
Alerted 300 patient was headed up. EDH has been charted. Time allowance has passed. Call was made to notify patient was being transferred.

## 2023-04-24 NOTE — ED Triage Notes (Signed)
Pt brought in by [facility] for a fall. Pt was talking to her brother in the store "and just fell." Pt states just hitting her head no LOC and rt arm pain but "previously hurt" before fall.

## 2023-04-25 ENCOUNTER — Inpatient Hospital Stay (HOSPITAL_COMMUNITY): Payer: Medicare HMO

## 2023-04-25 DIAGNOSIS — D649 Anemia, unspecified: Secondary | ICD-10-CM | POA: Diagnosis not present

## 2023-04-25 DIAGNOSIS — R9431 Abnormal electrocardiogram [ECG] [EKG]: Secondary | ICD-10-CM

## 2023-04-25 DIAGNOSIS — K922 Gastrointestinal hemorrhage, unspecified: Secondary | ICD-10-CM | POA: Diagnosis not present

## 2023-04-25 DIAGNOSIS — R55 Syncope and collapse: Secondary | ICD-10-CM | POA: Diagnosis not present

## 2023-04-25 LAB — GLUCOSE, CAPILLARY
Glucose-Capillary: 102 mg/dL — ABNORMAL HIGH (ref 70–99)
Glucose-Capillary: 103 mg/dL — ABNORMAL HIGH (ref 70–99)
Glucose-Capillary: 109 mg/dL — ABNORMAL HIGH (ref 70–99)
Glucose-Capillary: 151 mg/dL — ABNORMAL HIGH (ref 70–99)

## 2023-04-25 LAB — BASIC METABOLIC PANEL
Anion gap: 9 (ref 5–15)
BUN: 10 mg/dL (ref 8–23)
CO2: 25 mmol/L (ref 22–32)
Calcium: 8.8 mg/dL — ABNORMAL LOW (ref 8.9–10.3)
Chloride: 96 mmol/L — ABNORMAL LOW (ref 98–111)
Creatinine, Ser: 0.62 mg/dL (ref 0.44–1.00)
GFR, Estimated: >60 mL/min (ref >60)
Glucose, Bld: 143 mg/dL — ABNORMAL HIGH (ref 70–99)
Potassium: 3.1 mmol/L — ABNORMAL LOW (ref 3.5–5.1)
Sodium: 130 mmol/L — ABNORMAL LOW (ref 135–145)

## 2023-04-25 MED ORDER — POTASSIUM CHLORIDE CRYS ER 20 MEQ PO TBCR
40.0000 meq | EXTENDED_RELEASE_TABLET | ORAL | Status: AC
  Administered 2023-04-25 (×2): 40 meq via ORAL
  Filled 2023-04-25 (×2): qty 2

## 2023-04-25 MED ORDER — ATORVASTATIN CALCIUM 40 MG PO TABS
40.0000 mg | ORAL_TABLET | Freq: Every day | ORAL | Status: DC
  Administered 2023-04-25 – 2023-04-30 (×6): 40 mg via ORAL
  Filled 2023-04-25 (×6): qty 1

## 2023-04-25 NOTE — Progress Notes (Signed)
Katrinka Blazing, M.D. Gastroenterology & Hepatology   Interval History:  Patient reports feeling somewhat better although endoscopic submucosal dissection of feeling weak.  Denies any nausea or vomiting.  No melena or hematochezia reported. Hemoglobin remained stable with most recent result 8.3.  Notably, no BMP was checked today.  Inpatient Medications:  Current Facility-Administered Medications:    0.9 %  sodium chloride infusion, , Intravenous, PRN, Emokpae, Courage, MD   0.9 %  sodium chloride infusion, , Intravenous, Continuous, Emokpae, Courage, MD, Last Rate: 100 mL/hr at 04/25/23 0636, Infusion Verify at 04/25/23 0636   acetaminophen (TYLENOL) tablet 650 mg, 650 mg, Oral, Q6H PRN, 650 mg at 04/25/23 0027 **OR** acetaminophen (TYLENOL) suppository 650 mg, 650 mg, Rectal, Q6H PRN, Emokpae, Courage, MD   amLODipine (NORVASC) tablet 5 mg, 5 mg, Oral, Daily, Emokpae, Courage, MD, 5 mg at 04/25/23 1610   bisacodyl (DULCOLAX) suppository 10 mg, 10 mg, Rectal, Daily PRN, Mariea Clonts, Courage, MD   diazepam (VALIUM) tablet 2 mg, 2 mg, Oral, TID, Mariea Clonts, Courage, MD, 2 mg at 04/25/23 0027   folic acid (FOLVITE) tablet 1 mg, 1 mg, Oral, Daily, Emokpae, Courage, MD, 1 mg at 04/24/23 1849   levothyroxine (SYNTHROID) tablet 50 mcg, 50 mcg, Oral, QAC breakfast, Emokpae, Courage, MD, 50 mcg at 04/25/23 0627   LORazepam (ATIVAN) tablet 1-4 mg, 1-4 mg, Oral, Q1H PRN **OR** LORazepam (ATIVAN) injection 1-4 mg, 1-4 mg, Intravenous, Q1H PRN, Mariea Clonts, Courage, MD   metoprolol tartrate (LOPRESSOR) tablet 75 mg, 75 mg, Oral, BID, Emokpae, Courage, MD, 75 mg at 04/25/23 0027   multivitamin with minerals tablet 1 tablet, 1 tablet, Oral, Daily, Emokpae, Courage, MD, 1 tablet at 04/24/23 1850   ondansetron (ZOFRAN) injection 4 mg, 4 mg, Intravenous, Q6H PRN, Emokpae, Courage, MD   pantoprazole (PROTONIX) injection 40 mg, 40 mg, Intravenous, Q12H, Emokpae, Courage, MD, 40 mg at 04/25/23 0228   polyethylene glycol  (MIRALAX / GLYCOLAX) packet 17 g, 17 g, Oral, Daily PRN, Emokpae, Courage, MD   sodium chloride flush (NS) 0.9 % injection 3 mL, 3 mL, Intravenous, Q12H, Emokpae, Courage, MD, 3 mL at 04/25/23 0227   sodium chloride flush (NS) 0.9 % injection 3 mL, 3 mL, Intravenous, Q12H, Emokpae, Courage, MD, 3 mL at 04/25/23 0227   sodium chloride flush (NS) 0.9 % injection 3 mL, 3 mL, Intravenous, PRN, Mariea Clonts, Courage, MD   thiamine (VITAMIN B1) tablet 100 mg, 100 mg, Oral, Daily, 100 mg at 04/24/23 1849 **OR** thiamine (VITAMIN B1) injection 100 mg, 100 mg, Intravenous, Daily, Emokpae, Courage, MD   traZODone (DESYREL) tablet 50 mg, 50 mg, Oral, QHS PRN, Mariea Clonts, Courage, MD, 50 mg at 04/25/23 0027   I/O    Intake/Output Summary (Last 24 hours) at 04/25/2023 0718 Last data filed at 04/25/2023 0636 Gross per 24 hour  Intake 534.64 ml  Output --  Net 534.64 ml     Physical Exam: Temp:  [97.8 F (36.6 C)-98 F (36.7 C)] 97.8 F (36.6 C) (08/10 0021) Pulse Rate:  [74-89] 89 (08/10 0021) Resp:  [14-20] 20 (08/10 0021) BP: (138-150)/(69-77) 150/77 (08/10 0021) SpO2:  [74 %-100 %] 100 % (08/10 0021) Weight:  [54.2 kg] 54.2 kg (08/09 0938)  Temp (24hrs), Avg:97.9 F (36.6 C), Min:97.8 F (36.6 C), Max:98 F (36.7 C)  GENERAL: The patient is AO x2-3, in no acute distress. HEENT: Head is normocephalic and atraumatic. EOMI are intact. Mouth is well hydrated and without lesions. NECK: Supple. No masses LUNGS: Clear to auscultation. No presence of  rhonchi/wheezing/rales. Adequate chest expansion HEART: RRR, normal s1 and s2. ABDOMEN: Soft, nontender, no guarding, no peritoneal signs, and nondistended. BS +. No masses. EXTREMITIES: Without any cyanosis, clubbing, rash, lesions or edema. NEUROLOGIC: AOx3, no focal motor deficit. SKIN: no jaundice, no rashes  Laboratory Data: CBC:     Component Value Date/Time   WBC 8.7 04/25/2023 0508   RBC 2.73 (L) 04/25/2023 0508   HGB 8.3 (L) 04/25/2023 0508    HCT 25.8 (L) 04/25/2023 0508   PLT 369 04/25/2023 0508   MCV 94.5 04/25/2023 0508   MCH 30.4 04/25/2023 0508   MCHC 32.2 04/25/2023 0508   RDW 13.1 04/25/2023 0508   LYMPHSABS 1.2 04/24/2023 1030   MONOABS 0.9 04/24/2023 1030   EOSABS 0.0 04/24/2023 1030   BASOSABS 0.1 04/24/2023 1030   COAG:  Lab Results  Component Value Date   INR 1.2 04/20/2023   INR 1.2 04/21/2021   INR 1.8 (H) 02/28/2020    BMP:     Latest Ref Rng & Units 04/24/2023   10:30 AM 04/20/2023    9:35 AM 04/27/2021    2:12 AM  BMP  Glucose 70 - 99 mg/dL 784  696  295   BUN 8 - 23 mg/dL 14  14  27    Creatinine 0.44 - 1.00 mg/dL 2.84  1.32  4.40   Sodium 135 - 145 mmol/L 124  127  128   Potassium 3.5 - 5.1 mmol/L 3.5  4.1  4.4   Chloride 98 - 111 mmol/L 90  97  91   CO2 22 - 32 mmol/L 23  20  30    Calcium 8.9 - 10.3 mg/dL 9.1  8.9  9.4     HEPATIC:     Latest Ref Rng & Units 04/24/2023   10:30 AM 04/20/2023    9:35 AM 04/22/2021    5:36 AM  Hepatic Function  Total Protein 6.5 - 8.1 g/dL 8.1  8.4  7.6   Albumin 3.5 - 5.0 g/dL 4.0  4.4  3.7   AST 15 - 41 U/L 29  37  18   ALT 0 - 44 U/L 16  20  11    Alk Phosphatase 38 - 126 U/L 61  75  50   Total Bilirubin 0.3 - 1.2 mg/dL 1.1  1.4  1.1     CARDIAC:  Lab Results  Component Value Date   CKTOTAL 463 (H) 04/20/2023   TROPONINI <0.03 03/18/2018      Imaging: I personally reviewed and interpreted the available labs, imaging and endoscopic files.   Assessment/Plan: 67 year old female with past medical history of etoh abuse, HTN, hyperlipidemia, hypothyroidism,, who comes. To the hospital after presenting syncope. Gastroenterology was consulted for positive FOBT and worsening anemia.  It is not clear if the patient presented melena or coffee-ground emesis previously but has not presented this while she has been hospitalized.  Hemoglobin has remained stable but low.  Will recheck a BMP today, hopefully the sodium has improved so we can proceed with an EGD  tomorrow.  This will help [place] evaluate for the etiology of her  anemia and possible upper GI bleeding source.  -Pantoprazole 40 mg twice daily IV -Proceed with EGD tomorrow if sodium is better -Check BMP today  Katrinka Blazing, MD Gastroenterology and Hepatology [facility]

## 2023-04-25 NOTE — Plan of Care (Signed)

## 2023-04-25 NOTE — Progress Notes (Signed)
  Echocardiogram 2D Echocardiogram has been performed.  Janalyn Harder 04/25/2023, 11:02 AM

## 2023-04-25 NOTE — Progress Notes (Signed)
   04/25/23 1216  TOC Brief Assessment  Insurance and Status Reviewed  Patient has primary care physician Yes  Home environment has been reviewed From Home  Prior level of function: Independent  Prior/Current Home Services No current home services  Social Determinants of Health Reivew SDOH reviewed no interventions necessary  Readmission risk has been reviewed Yes [place] Si)  Transition of care needs transition of care needs identified, TOC will continue to follow   [facility] consult for SA education/information.  CSW placed in AVS and will attempt to discuss with pt prior to DC.  TOC to follow for additional needs.

## 2023-04-25 NOTE — Progress Notes (Signed)
PROGRESS NOTE     Judy Davis, is a 67 y.o. 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 past medical history relevant for alcohol abuse, recurrent falls in the setting of alcohol abuse, HTN, hypothyroidism and HLD admitted on 04/24/2023 after another episode of passing out and found to have acute on chronic symptomatic anemia    -Assessment and Plan: 1)Syncope- recurrent falls in the setting of alcohol abuse, -Ruled out for ACS by EKG and serial troponins -On telemetry monitored unit=-no significant  arrhythmias,  -- echocardiogram from 04/25/2023 w/o  significant aortic stenosis or other outflow obstruction, and without segmental/Regional wall motion abnormalities, EF is 60 to 65%,, grade 1 diastolic dysfunction noted  --carotid artery Dopplers without hemodynamically significant stenosis -CT C-spine without acute fractures, patient does not have cervical spondylosis with encroachment of the neural foramina from C3-C7 levels most severe at C5-C6- -CT of the head without acute findings patient does have atrophy and small vessel disease, old blowout fracture is seen in the floor of the left orbit --She was seen in the ED on 04/20/2023 after passing out and sustaining fractures of the right proximal humerus and left clavicle -PT eval after GI workup is completed     2) acute on chronic anemia=--- Hgb was 10.1 on 04/20/2023 -Hgb continues to drift down -Stool occult blood is positive in the ER -GI consult appreciated --Ferritin is 154, serum iron is low normal at 32 iron saturation is low at 10 TIBC 321 -Folate and B12 are not low -IV Protonix for now -Defer timing of possible EGD to GI team   3)Etoh Abuse-- BAL less than  10 --she drinks two [employer] 45 12 oz cans daily, occasional liquor  -- High risk for DTs, lorazepam per CIWA protocol -Thiamine and folic acid as ordered   4) chronic hyponatremia/hypochloremia--suspect alcohol abuse related -Sodium is chronically low at 124 which is not far from her baseline which usually ranges from 124 to 1 28, low at this 90 which is also close to baseline, -Gentle hydration with normal saline -Sodium is up to 130 -Monitor closely   5) hypothyroidism--continue levothyroxine, check TSH   6) right proximal humerus fracture/left clavicular fracture--diagnosed on 04/20/2023 after patient fell -Continue to use sling -Outpatient follow-up with orthopedic surgery advised   7)HTN--amlodipine and metoprolol as ordered  8)PAD---There is Severe (Grade IV) atheroma plaque involving the aortic arch as per echocardiogram from 04/25/2023 -Hold aspirin due to acute GI bleed concerns -Give atorvastatin  9) hypokalemia--- replace  Status is: Inpatient   Disposition: The patient is from: Home              Anticipated d/c is to: Home              Anticipated d/c date is: 2 days              Patient currently is not medically stable to d/c. Barriers: Not Clinically Stable-   Code Status :  -  Code Status: Full Code   Family Communication:    NA (patient is alert, awake and coherent)   DVT Prophylaxis  :   - SCDs  SCDs Start: 04/24/23 1915 Place TED hose  Start: 04/24/23 1915   Lab Results  Component Value Date   PLT 369 04/25/2023    Inpatient Medications  Scheduled Meds:  amLODipine  5 mg Oral Daily   diazepam  2 mg Oral TID   folic acid  1 mg Oral Daily   levothyroxine  50 mcg Oral QAC breakfast   metoprolol tartrate  75 mg Oral BID   multivitamin with minerals  1 tablet Oral Daily   pantoprazole (PROTONIX) IV  40 mg Intravenous Q12H   sodium chloride flush  3 mL Intravenous Q12H   sodium chloride flush  3 mL Intravenous Q12H   thiamine  100 mg Oral Daily   Or    thiamine  100 mg Intravenous Daily   Continuous Infusions:  sodium chloride     sodium chloride 100 mL/hr at 04/25/23 0636   PRN Meds:.sodium chloride, acetaminophen **OR** acetaminophen, bisacodyl, LORazepam **OR** LORazepam, ondansetron (ZOFRAN) IV, polyethylene glycol, sodium chloride flush, traZODone   Anti-infectives (From admission, onward)    None         Subjective: [name] today has no fevers, no emesis,  No chest pain,   No fever  Or chills - Fatigue persist  Objective: Vitals:   04/24/23 1600 04/24/23 1647 04/25/23 0021 04/25/23 1524  BP:  (!) 142/73 (!) 150/77 (!) 142/73  Pulse: 74 85 89 68  Resp: 14 20 20 17   Temp:   97.8 F (36.6 C) (!) 97.5 F (36.4 C)  TempSrc:   Oral Oral  SpO2: 100% 100% 100% 100%  Weight:      Height:        Intake/Output Summary (Last 24 hours) at 04/25/2023 1932 Last data filed at 04/25/2023 1804 Gross per 24 hour  Intake 1847.23 ml  Output --  Net 1847.23 ml   Filed Weights   04/24/23 0938  Weight: 54.2 kg   Physical Exam  Gen:- Awake Alert,  in no apparent distress  HEENT:- [place].AT, No sclera icterus Neck-Supple Neck,No JVD,.  Lungs-  CTAB , fair symmetrical air movement CV- S1, S2 normal, regular  Abd-  +ve B.Sounds, Abd Soft, No tenderness,    Extremity/Skin:- No  edema, pedal pulses present  Psych-affect is appropriate, oriented x3 Neuro-no new focal deficits, no tremors MSK-right upper extremity is in a sling   Data Reviewed: I have personally reviewed following labs and imaging studies  CBC: Recent Labs  Lab 04/20/23 0935 04/24/23 1030 04/25/23 0508  WBC 10.3 11.2* 8.7  NEUTROABS  --  9.0*  --   HGB 10.1* 8.6* 8.3*  HCT 31.0* 26.0* 25.8*  MCV 92.0 91.9 94.5  PLT 325 355 369   Basic Metabolic Panel: Recent Labs  Lab 04/20/23 0935 04/24/23 1030 04/25/23 1600  NA 127* 124* 130*  K 4.1 3.5 3.1*  CL 97* 90* 96*  CO2 20* 23 25  GLUCOSE 129* 123* 143*  BUN 14 14 10   CREATININE 0.93 0.71  0.62  CALCIUM 8.9 9.1 8.8*   GFR: Estimated Creatinine Clearance: 57.6 mL/min (by C-G formula based on SCr of 0.62 mg/dL). Liver Function Tests: Recent Labs  Lab 04/20/23 0935 04/24/23 1030  AST 37 29  ALT 20 16  ALKPHOS 75 61  BILITOT 1.4* 1.1  PROT 8.4* 8.1  ALBUMIN 4.4 4.0   Cardiac Enzymes: Recent Labs  Lab 04/20/23 0935  CKTOTAL 463*   adiology Studies: ECHOCARDIOGRAM COMPLETE  Result Date: 04/25/2023    ECHOCARDIOGRAM REPORT   Patient Name:   Judy Davis Date  of Exam: 04/25/2023 Medical Rec #:  [id number]         Height:       66.0 in Accession #:    [id number]        Weight:       119.6 lb Date of Birth:  Oct 21, 1955          BSA:          1.607 m Patient Age:    67 years          BP:           150/77 mmHg Patient Gender: F                 HR:           65 bpm. [facility] Procedure: 2D Echo, Cardiac Doppler and Color Doppler Indications:    R94.31 Abnormal EKG  History:        Patient has prior history of Echocardiogram examinations, most                 recent 03/01/2020. Abnormal ECG, Arrythmias:Tachycardia,                 Signs/Symptoms:Syncope and Altered Mental Status; Risk                 Factors:Hypertension and Dyslipidemia. ETOH.  Sonographer:    Sheralyn Boatman RDCS Referring Phys: [id number] Tyshawn Keel IMPRESSIONS  1. Left ventricular ejection fraction, by estimation, is 60 to 65%. The left ventricle has normal function. The left ventricle has no regional wall motion abnormalities. Left ventricular diastolic parameters are consistent with Grade I diastolic dysfunction (impaired relaxation).  2. Right ventricular systolic function is normal. The right ventricular size is normal. Mildly increased right ventricular wall thickness.  3. The mitral valve is degenerative. Trivial mitral valve regurgitation. No evidence of mitral stenosis.  4. The aortic valve is tricuspid. Aortic valve regurgitation is not visualized. No aortic stenosis is present.  5. There is  Severe (Grade IV) atheroma plaque involving the aortic arch. Comparison(s): No significant change from prior study. Prior images reviewed side by side. FINDINGS  Left Ventricle: Left ventricular ejection fraction, by estimation, is 60 to 65%. The left ventricle has normal function. The left ventricle has no regional wall motion abnormalities. The left ventricular internal cavity size was normal in size. There is  no left ventricular hypertrophy. Left ventricular diastolic parameters are consistent with Grade I diastolic dysfunction (impaired relaxation). Right Ventricle: The right ventricular size is normal. Mildly increased right ventricular wall thickness. Right ventricular systolic function is normal. Left Atrium: Left atrial size was normal in size. Right Atrium: Right atrial size was normal in size. Pericardium: There is no evidence of pericardial effusion. Mitral Valve: The mitral valve is degenerative in appearance. Trivial mitral valve regurgitation. No evidence of mitral valve stenosis. Tricuspid Valve: The tricuspid valve is normal in structure. Tricuspid valve regurgitation is trivial. No evidence of tricuspid stenosis. Aortic Valve: The aortic valve is tricuspid. Aortic valve regurgitation is not visualized. No aortic stenosis is present. Pulmonic Valve: The pulmonic valve was normal in structure. Pulmonic valve regurgitation is not visualized. No evidence of pulmonic stenosis. Aorta: The aortic root and ascending aorta are structurally normal, with no evidence of dilitation. There is severe (Grade IV) atheroma plaque involving the aortic arch. IAS/Shunts: The interatrial septum is aneurysmal. No atrial level shunt detected by color flow Doppler.  LEFT VENTRICLE PLAX 2D LVIDd:  4.10 cm     Diastology LVIDs:         2.80 cm     LV e' medial:    5.87 cm/s LV PW:         1.00 cm     LV E/e' medial:  10.3 LV IVS:        1.00 cm     LV e' lateral:   6.53 cm/s LVOT diam:     2.00 cm     LV E/e' lateral:  9.2 LV SV:         57 LV SV Index:   36 LVOT Area:     3.14 cm  LV Volumes (MOD) LV vol d, MOD A2C: 46.4 ml LV vol d, MOD A4C: 55.9 ml LV vol s, MOD A2C: 14.5 ml LV vol s, MOD A4C: 20.6 ml LV SV MOD A2C:     32.0 ml LV SV MOD A4C:     55.9 ml LV SV MOD BP:      33.7 ml RIGHT VENTRICLE            IVC RV S prime:     6.64 cm/s  IVC diam: 1.80 cm TAPSE (M-mode): 2.2 cm LEFT ATRIUM             Index        RIGHT ATRIUM           Index LA diam:        2.90 cm 1.80 cm/m   RA Area:     14.40 cm LA Vol (A2C):   21.6 ml 13.44 ml/m  RA Volume:   34.20 ml  21.28 ml/m LA Vol (A4C):   13.1 ml 8.15 ml/m LA Biplane Vol: 18.2 ml 11.32 ml/m  AORTIC VALVE LVOT Vmax:   80.40 cm/s LVOT Vmean:  52.000 cm/s LVOT VTI:    0.182 m  AORTA Ao Root diam: 3.10 cm Ao Asc diam:  3.40 cm MITRAL VALVE               TRICUSPID VALVE MV Area (PHT): 3.03 cm    TR Peak grad:   20.2 mmHg MV Decel Time: 250 msec    TR Vmax:        225.00 cm/s MV E velocity: 60.20 cm/s MV A velocity: 77.70 cm/s  SHUNTS MV E/A ratio:  0.77        Systemic VTI:  0.18 m                            Systemic Diam: 2.00 cm Riley Lam MD Electronically signed by Riley Lam MD Signature Date/Time: 04/25/2023/1:03:42 PM    Final    US Carotid Bilateral  Result Date: 04/25/2023 CLINICAL DATA:  Syncope, hypertension, history of tobacco abuse, hyperlipidemia EXAM: BILATERAL CAROTID DUPLEX ULTRASOUND TECHNIQUE: Wallace Cullens scale imaging, color Doppler and duplex ultrasound were performed of bilateral carotid and vertebral arteries in the neck. COMPARISON:  None Available. FINDINGS: Criteria: Quantification of carotid stenosis is based on velocity parameters that correlate the residual internal carotid diameter with NASCET-based stenosis levels, using the diameter of the distal internal carotid lumen as the denominator for stenosis measurement. The following velocity measurements were obtained: RIGHT ICA: 77/22 cm/sec CCA: 76/17 cm/sec SYSTOLIC ICA/CCA RATIO:  84  ECA: 1.0 cm/sec LEFT ICA: 97/26 cm/sec CCA: 83/12 cm/sec SYSTOLIC ICA/CCA RATIO:  1.2 ECA: 77 cm/sec RIGHT CAROTID ARTERY: Mild intimal thickening. Eccentric partially calcified plaque at  the bifurcation resulting in only mild stenosis. Normal waveforms and color Doppler signal throughout. RIGHT VERTEBRAL ARTERY:  Normal flow direction and waveform. LEFT CAROTID ARTERY: Mild plaque in the bulb extending into ICA and ECA origins resulting in only mild stenosis. Normal waveforms and color Doppler signal throughout. LEFT VERTEBRAL ARTERY:  Normal flow direction and waveform. IMPRESSION: 1. Bilateral carotid bifurcation plaque resulting in less than 50% diameter ICA stenosis. 2. Antegrade bilateral vertebral arterial flow. Electronically Signed   By: Corlis Leak M.D.   On: 04/25/2023 10:16   CT CERVICAL SPINE WO CONTRAST  Result Date: 04/24/2023 CLINICAL DATA:  Trauma, fall EXAM: CT CERVICAL SPINE WITHOUT CONTRAST TECHNIQUE: Multidetector CT imaging of the cervical spine was performed without intravenous contrast. Multiplanar CT image reconstructions were also generated. RADIATION DOSE REDUCTION: This exam was performed according to the departmental dose-optimization program which includes automated exposure control, adjustment of the mA and/or kV according to patient size and/or use of iterative reconstruction technique. COMPARISON:  03/18/2018 FINDINGS: Alignment: Alignment of posterior margins of vertebral bodies appears normal. Skull base and vertebrae: No recent fracture is seen. Degenerative changes are noted with bony spurs at multiple levels, more prominent at C5-C6 and C6-C7 levels. Soft tissues and spinal canal: There is extrinsic pressure over the ventral margin of thecal sac caused by posterior bony spurs at C5-C6 level extending more to the right. Disc levels: There is encroachment of neural foramina by bony spurs from C3-C7 levels. Upper chest: Scarring is seen in the apices of both lungs. Other:  Scattered arterial calcifications are seen. IMPRESSION: No recent fracture is seen in cervical spine. Cervical spondylosis with encroachment of neural foramina from C3 to C7 levels, more severe at C5-C6 level. Electronically Signed   By: Ernie Avena M.D.   On: 04/24/2023 13:21   CT HEAD WO CONTRAST  Result Date: 04/24/2023 CLINICAL DATA:  Trauma, fall, syncope EXAM: CT HEAD WITHOUT CONTRAST TECHNIQUE: Contiguous axial images were obtained from the base of the skull through the vertex without intravenous contrast. RADIATION DOSE REDUCTION: This exam was performed according to the departmental dose-optimization program which includes automated exposure control, adjustment of the mA and/or kV according to patient size and/or use of iterative reconstruction technique. COMPARISON:  04/20/2023 FINDINGS: Brain: No acute intracranial findings are seen. There are no signs of bleeding within the cranium. Calcifications are seen in basal ganglia. Cortical sulci are prominent. There is decreased density in periventricular and subcortical white matter. Vascular: Unremarkable. Skull: No fracture is seen in calvarium. Sinuses/Orbits: There are no air-fluid levels or significant mucosal thickening. Old blowout fracture is seen in the floor of the left orbit which has not changed in comparison with the earlier examination done on 03/18/2018. Other: None. IMPRESSION: No acute intracranial findings are seen in noncontrast CT brain. Atrophy. Small-vessel disease. Old blowout fracture is seen in the floor of left orbit. Electronically Signed   By: Ernie Avena M.D.   On: 04/24/2023 13:14   DG Chest 1 View  Result Date: 04/24/2023 CLINICAL DATA:  Syncope.  Fall EXAM: CHEST  1 VIEW COMPARISON:  04/21/2021 FINDINGS: Minimal linear opacity at the lung bases likely scar or atelectasis. No consolidation. Normal cardiopericardial silhouette. No edema, pneumothorax or effusion. Known comminuted right humeral neck fracture.  Please correlate with the recent shoulder x-ray of 04/20/2023. IMPRESSION: Slight basilar atelectasis.  No pneumothorax or effusion. Known right proximal humeral fracture Electronically Signed   By: Karen Kays M.D.   On: 04/24/2023 12:51  Scheduled Meds:  amLODipine  5 mg Oral Daily   diazepam  2 mg Oral TID   folic acid  1 mg Oral Daily   levothyroxine  50 mcg Oral QAC breakfast   metoprolol tartrate  75 mg Oral BID   multivitamin with minerals  1 tablet Oral Daily   pantoprazole (PROTONIX) IV  40 mg Intravenous Q12H   sodium chloride flush  3 mL Intravenous Q12H   sodium chloride flush  3 mL Intravenous Q12H   thiamine  100 mg Oral Daily   Or   thiamine  100 mg Intravenous Daily   Continuous Infusions:  sodium chloride     sodium chloride 100 mL/hr at 04/25/23 0636     LOS: 1 day    Shon Hale M.D on 04/25/2023 at 7:32 PM  Go to [email or web address] - for contact info  [facility] - Office  [phone]  If 7PM-7AM, please contact night-coverage [email or web address] 04/25/2023, 7:32 PM

## 2023-04-26 ENCOUNTER — Inpatient Hospital Stay (HOSPITAL_COMMUNITY): Payer: Medicare HMO | Admitting: Anesthesiology

## 2023-04-26 ENCOUNTER — Encounter (HOSPITAL_COMMUNITY)
Admission: EM | Disposition: A | Discharge status: Home Health | Payer: Self-pay | Source: Home / Self Care | Attending: Family Medicine

## 2023-04-26 ENCOUNTER — Encounter (HOSPITAL_COMMUNITY): Payer: Self-pay | Admitting: Family Medicine

## 2023-04-26 DIAGNOSIS — K222 Esophageal obstruction: Secondary | ICD-10-CM

## 2023-04-26 DIAGNOSIS — R55 Syncope and collapse: Secondary | ICD-10-CM | POA: Diagnosis not present

## 2023-04-26 DIAGNOSIS — K921 Melena: Secondary | ICD-10-CM

## 2023-04-26 DIAGNOSIS — K2100 Gastro-esophageal reflux disease with esophagitis, without bleeding: Secondary | ICD-10-CM

## 2023-04-26 HISTORY — PX: ESOPHAGOGASTRODUODENOSCOPY (EGD) WITH PROPOFOL: SHX5813

## 2023-04-26 HISTORY — PX: BIOPSY: SHX5522

## 2023-04-26 LAB — BASIC METABOLIC PANEL WITH GFR
Anion gap: 6 (ref 5–15)
BUN: 7 mg/dL — ABNORMAL LOW (ref 8–23)
CO2: 24 mmol/L (ref 22–32)
Calcium: 8.4 mg/dL — ABNORMAL LOW (ref 8.9–10.3)
Chloride: 100 mmol/L (ref 98–111)
Creatinine, Ser: 0.55 mg/dL (ref 0.44–1.00)
GFR, Estimated: >60 mL/min (ref >60)
Glucose, Bld: 101 mg/dL — ABNORMAL HIGH (ref 70–99)
Potassium: 3.3 mmol/L — ABNORMAL LOW (ref 3.5–5.1)
Sodium: 130 mmol/L — ABNORMAL LOW (ref 135–145)

## 2023-04-26 LAB — RENAL FUNCTION PANEL
Albumin: 3.2 g/dL — ABNORMAL LOW (ref 3.5–5.0)
Anion gap: 10 (ref 5–15)
BUN: 8 mg/dL (ref 8–23)
CO2: 21 mmol/L — ABNORMAL LOW (ref 22–32)
Calcium: 8.4 mg/dL — ABNORMAL LOW (ref 8.9–10.3)
Chloride: 99 mmol/L (ref 98–111)
Creatinine, Ser: 0.59 mg/dL (ref 0.44–1.00)
GFR, Estimated: >60 mL/min (ref >60)
Glucose, Bld: 98 mg/dL (ref 70–99)
Phosphorus: 2.7 mg/dL (ref 2.5–4.6)
Potassium: 3.3 mmol/L — ABNORMAL LOW (ref 3.5–5.1)
Sodium: 130 mmol/L — ABNORMAL LOW (ref 135–145)

## 2023-04-26 LAB — CBC
HCT: 25.3 % — ABNORMAL LOW (ref 36.0–46.0)
Hemoglobin: 8 g/dL — ABNORMAL LOW (ref 12.0–15.0)
MCH: 29.9 pg (ref 26.0–34.0)
MCHC: 31.6 g/dL (ref 30.0–36.0)
MCV: 94.4 fL (ref 80.0–100.0)
Platelets: 364 10*3/uL (ref 150–400)
RBC: 2.68 MIL/uL — ABNORMAL LOW (ref 3.87–5.11)
RDW: 13 % (ref 11.5–15.5)
WBC: 7.7 10*3/uL (ref 4.0–10.5)
nRBC: 0 % (ref 0.0–0.2)

## 2023-04-26 LAB — GLUCOSE, CAPILLARY
Glucose-Capillary: 107 mg/dL — ABNORMAL HIGH (ref 70–99)
Glucose-Capillary: 112 mg/dL — ABNORMAL HIGH (ref 70–99)
Glucose-Capillary: 115 mg/dL — ABNORMAL HIGH (ref 70–99)

## 2023-04-26 LAB — MAGNESIUM: Magnesium: 1.8 mg/dL (ref 1.7–2.4)

## 2023-04-26 SURGERY — ESOPHAGOGASTRODUODENOSCOPY (EGD) WITH PROPOFOL
Anesthesia: General

## 2023-04-26 MED ORDER — FENTANYL CITRATE PF 50 MCG/ML IJ SOSY
25.0000 ug | PREFILLED_SYRINGE | INTRAMUSCULAR | Status: AC | PRN
  Administered 2023-04-26 – 2023-04-29 (×2): 50 ug via INTRAVENOUS
  Filled 2023-04-26 (×2): qty 1

## 2023-04-26 MED ORDER — SODIUM CHLORIDE 0.9 % IV SOLN: INTRAVENOUS | Status: DC

## 2023-04-26 MED ORDER — PROPOFOL 500 MG/50ML IV EMUL
INTRAVENOUS | Status: DC | PRN
  Administered 2023-04-26: 150 ug/kg/min via INTRAVENOUS

## 2023-04-26 MED ORDER — PROPOFOL 10 MG/ML IV BOLUS
INTRAVENOUS | Status: AC
  Filled 2023-04-26: qty 20

## 2023-04-26 MED ORDER — LACTATED RINGERS IV SOLN: INTRAVENOUS | Status: DC | PRN

## 2023-04-26 MED ORDER — LIDOCAINE HCL (PF) 2 % IJ SOLN
INTRAMUSCULAR | Status: AC
  Filled 2023-04-26: qty 5

## 2023-04-26 MED ORDER — PROPOFOL 10 MG/ML IV BOLUS
INTRAVENOUS | Status: DC | PRN
Start: 2023-04-26 — End: 2023-04-26
  Administered 2023-04-26: 30 mg via INTRAVENOUS
  Administered 2023-04-26: 50 mg via INTRAVENOUS

## 2023-04-26 MED ORDER — PHENYLEPHRINE 80 MCG/ML (10ML) SYRINGE FOR IV PUSH (FOR BLOOD PRESSURE SUPPORT)
PREFILLED_SYRINGE | INTRAVENOUS | Status: DC | PRN
Start: 2023-04-26 — End: 2023-04-26
  Administered 2023-04-26 (×2): 80 ug via INTRAVENOUS

## 2023-04-26 MED ORDER — POTASSIUM CHLORIDE CRYS ER 20 MEQ PO TBCR
40.0000 meq | EXTENDED_RELEASE_TABLET | ORAL | Status: AC
  Administered 2023-04-26 (×2): 40 meq via ORAL
  Filled 2023-04-26 (×2): qty 2

## 2023-04-26 MED ORDER — LIDOCAINE HCL (CARDIAC) PF 100 MG/5ML IV SOSY
PREFILLED_SYRINGE | INTRAVENOUS | Status: DC | PRN
Start: 2023-04-26 — End: 2023-04-26
  Administered 2023-04-26: 50 mg via INTRATRACHEAL

## 2023-04-26 NOTE — Progress Notes (Signed)
PROGRESS NOTE     Judy Davis, is a 67 y.o. 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 past medical history relevant for alcohol abuse, recurrent falls in the setting of alcohol abuse, HTN, hypothyroidism and HLD admitted on 04/24/2023 after another episode of passing out and found to have acute on chronic symptomatic anemia   -Assessment and Plan: 1)Syncope- recurrent falls in the setting of alcohol abuse, -Ruled out for ACS by EKG and serial troponins -On telemetry monitored unit=-no significant  arrhythmias,  -- echocardiogram from 04/25/2023 w/o  significant aortic stenosis or other outflow obstruction, and without segmental/Regional wall motion abnormalities, EF is 60 to 65%,, grade 1 diastolic dysfunction noted  --carotid artery Dopplers without hemodynamically significant stenosis -CT C-spine without acute fractures, patient does not have cervical spondylosis with encroachment of the neural foramina from C3-C7 levels most severe at C5-C6- -CT of the head without acute findings patient does have atrophy and small vessel disease, old blowout fracture is seen in the floor of the left orbit --She was seen in the ED on 04/20/2023 after passing out and sustaining fractures of the right proximal humerus and left clavicle -PT eval after GI workup is completed     2) acute on chronic symptomatic anemia=--- Hgb was 10.1 on 04/20/2023 -Hgb continues to drift down -Stool occult blood is positive in the ER -GI consult appreciated --Ferritin is 154, serum iron is low normal at 32 iron saturation is low at 10 TIBC 321 -Folate and B12 are not low -IV Protonix for now -EGD on 04/26/2023 showed---FINDINGS: - LA Grade C reflux esophagitis  with no bleeding.  Biopsied. - Benign-appearing esophageal stenosis.  - 3 cm hiatal hernia.  - A few mucosal papules (nodules) found in the stomach.  Biopsied.  - Normal examined duodenum.  -Plans for capsule endoscopy on 04/27/2023   3)Etoh Abuse-- BAL less than 10 --she drinks two [employer] 45 12 oz cans daily, occasional liquor  -- High risk for DTs, lorazepam per CIWA protocol -Thiamine and folic acid as ordered   4) chronic hyponatremia/hypochloremia/hypokalemia--suspect alcohol abuse related -Sodium is chronically low at 124 which is not far from her baseline which usually ranges from 124 to 1 28, low at this 90 which is also close to baseline, -Gentle hydration with normal saline -Sodium is up to 130 -Replace potassium -Monitor closely   5) hypothyroidism--continue levothyroxine, check TSH   6) right proximal humerus fracture/left clavicular fracture--diagnosed on 04/20/2023 after patient fell -Continue to use sling on right upper extremity -Outpatient follow-up with orthopedic surgery advised   7)HTN--amlodipine and metoprolol as ordered  8)PAD---There is Severe (Grade IV) atheroma plaque involving the aortic arch as per echocardiogram from 04/25/2023 -Hold aspirin due to acute GI bleed concerns -Started on atorvastatin on 04/25/2023  9) hypokalemia--- replaced  Status is: Inpatient   Disposition: The patient is from: Home              Anticipated d/c is to: Home              Anticipated d/c date is: 1 day              Patient currently is not medically stable to  d/c. Barriers: Not Clinically Stable-   Code Status :  -  Code Status: Full Code   Family Communication:    NA (patient is alert, awake and coherent)   DVT Prophylaxis  :   - SCDs  SCDs Start: 04/24/23 1915 Place TED hose Start: 04/24/23 1915   Lab Results  Component Value Date   PLT 364 04/26/2023    Inpatient Medications  Scheduled Meds:  amLODipine  5 mg Oral Daily   atorvastatin  40 mg Oral q1800    folic acid  1 mg Oral Daily   levothyroxine  50 mcg Oral QAC breakfast   metoprolol tartrate  75 mg Oral BID   multivitamin with minerals  1 tablet Oral Daily   pantoprazole (PROTONIX) IV  40 mg Intravenous Q12H   potassium chloride  40 mEq Oral Q3H   sodium chloride flush  3 mL Intravenous Q12H   sodium chloride flush  3 mL Intravenous Q12H   thiamine  100 mg Oral Daily   Or   thiamine  100 mg Intravenous Daily   Continuous Infusions:  sodium chloride     sodium chloride 100 mL/hr at 04/25/23 0636   PRN Meds:.sodium chloride, acetaminophen **OR** acetaminophen, bisacodyl, fentaNYL (SUBLIMAZE) injection, LORazepam **OR** LORazepam, ondansetron (ZOFRAN) IV, polyethylene glycol, sodium chloride flush, traZODone   Anti-infectives (From admission, onward)    None       Subjective: [name] today has no fevers, no emesis,  No chest pain,   No fever  Or chills - Fatigue persist-- --hungry -Tolerated EGD well  Objective: Vitals:   04/26/23 0900 04/26/23 0909 04/26/23 1025 04/26/23 1440  BP:  (!) 112/59 126/68 (!) 99/58  Pulse: 70 69 66 73  Resp: 15 16 16 17   Temp:  (!) 97.5 F (36.4 C)  97.7 F (36.5 C)  TempSrc:    Oral  SpO2: 100% 98% 100% 97%  Weight:      Height:        Intake/Output Summary (Last 24 hours) at 04/26/2023 1504 Last data filed at 04/26/2023 0900 Gross per 24 hour  Intake 740 ml  Output --  Net 740 ml   Filed Weights   04/24/23 0938  Weight: 54.2 kg   Physical Exam Gen:- Awake Alert,  in no apparent distress  HEENT:- [place].AT, No sclera icterus Neck-Supple Neck,No JVD,.  Lungs-  CTAB , fair symmetrical air movement CV- S1, S2 normal, regular  Abd-  +ve B.Sounds, Abd Soft, No tenderness,    Extremity/Skin:- No  edema, pedal pulses present  Psych-affect is appropriate, oriented x3 Neuro-no new focal deficits, no tremors MSK-right upper extremity is in a sling , left clavicular tenderness  Data Reviewed: I have personally reviewed  following labs and imaging studies  CBC: Recent Labs  Lab 04/20/23 0935 04/24/23 1030 04/25/23 0508 04/26/23 0358  WBC 10.3 11.2* 8.7 7.7  NEUTROABS  --  9.0*  --   --   HGB 10.1* 8.6* 8.3* 8.0*  HCT 31.0* 26.0* 25.8* 25.3*  MCV 92.0 91.9 94.5 94.4  PLT 325 355 369 364   Basic Metabolic Panel: Recent Labs  Lab 04/20/23 0935 04/24/23 1030 04/25/23 1600 04/26/23 0358 04/26/23 0401  NA 127* 124* 130* 130* 130*  K 4.1 3.5 3.1* 3.3* 3.3*  CL 97* 90* 96* 100 99  CO2 20* 23 25 24  21*  GLUCOSE 129* 123* 143* 101* 98  BUN 14 14 10  7* 8  CREATININE 0.93 0.71 0.62 0.55 0.59  CALCIUM 8.9  9.1 8.8* 8.4* 8.4*  MG  --   --   --  1.8  --   PHOS  --   --   --   --  2.7   GFR: Estimated Creatinine Clearance: 57.6 mL/min (by C-G formula based on SCr of 0.59 mg/dL). Liver Function Tests: Recent Labs  Lab 04/20/23 0935 04/24/23 1030 04/26/23 0401  AST 37 29  --   ALT 20 16  --   ALKPHOS 75 61  --   BILITOT 1.4* 1.1  --   PROT 8.4* 8.1  --   ALBUMIN 4.4 4.0 3.2*   Cardiac Enzymes: Recent Labs  Lab 04/20/23 0935  CKTOTAL 463*   adiology Studies: ECHOCARDIOGRAM COMPLETE  Result Date: 04/25/2023    ECHOCARDIOGRAM REPORT   Patient Name:   LAIGHLA MCMURDIE Date of Exam: 04/25/2023 Medical Rec #:  [id number]         Height:       66.0 in Accession #:    [id number]        Weight:       119.6 lb Date of Birth:  [birth year]          BSA:          1.607 m Patient Age:    67 years          BP:           150/77 mmHg Patient Gender: F                 HR:           65 bpm. [facility] Procedure: 2D Echo, Cardiac Doppler and Color Doppler Indications:    R94.31 Abnormal EKG  History:        Patient has prior history of Echocardiogram examinations, most                 recent 03/01/2020. Abnormal ECG, Arrythmias:Tachycardia,                 Signs/Symptoms:Syncope and Altered Mental Status; Risk                 Factors:Hypertension and Dyslipidemia. ETOH.  Sonographer:    Sheralyn Boatman RDCS  Referring Phys: [id number] Duwane Gewirtz IMPRESSIONS  1. Left ventricular ejection fraction, by estimation, is 60 to 65%. The left ventricle has normal function. The left ventricle has no regional wall motion abnormalities. Left ventricular diastolic parameters are consistent with Grade I diastolic dysfunction (impaired relaxation).  2. Right ventricular systolic function is normal. The right ventricular size is normal. Mildly increased right ventricular wall thickness.  3. The mitral valve is degenerative. Trivial mitral valve regurgitation. No evidence of mitral stenosis.  4. The aortic valve is tricuspid. Aortic valve regurgitation is not visualized. No aortic stenosis is present.  5. 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 Comparison(s): No significant change from prior study. Prior images reviewed side by side. FINDINGS  Left Ventricle: Left ventricular ejection fraction, by estimation, is 60 to 65%. The left ventricle has normal function. The left ventricle has no regional wall motion abnormalities. The left ventricular internal cavity size was normal in size. There is  no left ventricular hypertrophy. Left ventricular diastolic parameters are consistent with Grade I diastolic dysfunction (impaired relaxation). Right Ventricle: The right ventricular size is normal. Mildly increased right ventricular wall thickness. Right ventricular systolic function is normal. Left Atrium: Left atrial size was normal in size. Right  Atrium: Right atrial size was normal in size. Pericardium: There is no evidence of pericardial effusion. Mitral Valve: The mitral valve is degenerative in appearance. Trivial mitral valve regurgitation. No evidence of mitral valve stenosis. Tricuspid Valve: The tricuspid valve is normal in structure. Tricuspid valve regurgitation is trivial. No evidence of tricuspid stenosis. Aortic Valve: The aortic valve is tricuspid. Aortic valve regurgitation is not visualized. No  aortic stenosis is present. Pulmonic Valve: The pulmonic valve was normal in structure. Pulmonic valve regurgitation is not visualized. No evidence of pulmonic stenosis. Aorta: The aortic root and ascending aorta are structurally normal, with no evidence of dilitation. There is severe (Grade IV) atheroma plaque involving the aortic arch. IAS/Shunts: The interatrial septum is aneurysmal. No atrial level shunt detected by color flow Doppler.  LEFT VENTRICLE PLAX 2D LVIDd:         4.10 cm     Diastology LVIDs:         2.80 cm     LV e' medial:    5.87 cm/s LV PW:         1.00 cm     LV E/e' medial:  10.3 LV IVS:        1.00 cm     LV e' lateral:   6.53 cm/s LVOT diam:     2.00 cm     LV E/e' lateral: 9.2 LV SV:         57 LV SV Index:   36 LVOT Area:     3.14 cm  LV Volumes (MOD) LV vol d, MOD A2C: 46.4 ml LV vol d, MOD A4C: 55.9 ml LV vol s, MOD A2C: 14.5 ml LV vol s, MOD A4C: 20.6 ml LV SV MOD A2C:     32.0 ml LV SV MOD A4C:     55.9 ml LV SV MOD BP:      33.7 ml RIGHT VENTRICLE            IVC RV S prime:     6.64 cm/s  IVC diam: 1.80 cm TAPSE (M-mode): 2.2 cm LEFT ATRIUM             Index        RIGHT ATRIUM           Index LA diam:        2.90 cm 1.80 cm/m   RA Area:     14.40 cm LA Vol (A2C):   21.6 ml 13.44 ml/m  RA Volume:   34.20 ml  21.28 ml/m LA Vol (A4C):   13.1 ml 8.15 ml/m LA Biplane Vol: 18.2 ml 11.32 ml/m  AORTIC VALVE LVOT Vmax:   80.40 cm/s LVOT Vmean:  52.000 cm/s LVOT VTI:    0.182 m  AORTA Ao Root diam: 3.10 cm Ao Asc diam:  3.40 cm MITRAL VALVE               TRICUSPID VALVE MV Area (PHT): 3.03 cm    TR Peak grad:   20.2 mmHg MV Decel Time: 250 msec    TR Vmax:        225.00 cm/s MV E velocity: 60.20 cm/s MV A velocity: 77.70 cm/s  SHUNTS MV E/A ratio:  0.77        Systemic VTI:  0.18 m                            Systemic Diam: 2.00 cm Riley Lam MD Electronically  signed by Riley Lam MD Signature Date/Time: 04/25/2023/1:03:42 PM    Final    US Carotid  Bilateral  Result Date: 04/25/2023 CLINICAL DATA:  Syncope, hypertension, history of tobacco abuse, hyperlipidemia EXAM: BILATERAL CAROTID DUPLEX ULTRASOUND TECHNIQUE: Wallace Cullens scale imaging, color Doppler and duplex ultrasound were performed of bilateral carotid and vertebral arteries in the neck. COMPARISON:  None Available. FINDINGS: Criteria: Quantification of carotid stenosis is based on velocity parameters that correlate the residual internal carotid diameter with NASCET-based stenosis levels, using the diameter of the distal internal carotid lumen as the denominator for stenosis measurement. The following velocity measurements were obtained: RIGHT ICA: 77/22 cm/sec CCA: 76/17 cm/sec SYSTOLIC ICA/CCA RATIO:  84 ECA: 1.0 cm/sec LEFT ICA: 97/26 cm/sec CCA: 83/12 cm/sec SYSTOLIC ICA/CCA RATIO:  1.2 ECA: 77 cm/sec RIGHT CAROTID ARTERY: Mild intimal thickening. Eccentric partially calcified plaque at the bifurcation resulting in only mild stenosis. Normal waveforms and color Doppler signal throughout. RIGHT VERTEBRAL ARTERY:  Normal flow direction and waveform. LEFT CAROTID ARTERY: Mild plaque in the bulb extending into ICA and ECA origins resulting in only mild stenosis. Normal waveforms and color Doppler signal throughout. LEFT VERTEBRAL ARTERY:  Normal flow direction and waveform. IMPRESSION: 1. Bilateral carotid bifurcation plaque resulting in less than 50% diameter ICA stenosis. 2. Antegrade bilateral vertebral arterial flow. Electronically Signed   By: Corlis Leak M.D.   On: 04/25/2023 10:16    Scheduled Meds:  amLODipine  5 mg Oral Daily   atorvastatin  40 mg Oral q1800   folic acid  1 mg Oral Daily   levothyroxine  50 mcg Oral QAC breakfast   metoprolol tartrate  75 mg Oral BID   multivitamin with minerals  1 tablet Oral Daily   pantoprazole (PROTONIX) IV  40 mg Intravenous Q12H   potassium chloride  40 mEq Oral Q3H   sodium chloride flush  3 mL Intravenous Q12H   sodium chloride flush  3 mL  Intravenous Q12H   thiamine  100 mg Oral Daily   Or   thiamine  100 mg Intravenous Daily   Continuous Infusions:  sodium chloride     sodium chloride 100 mL/hr at 04/25/23 0636    LOS: 2 days   Shon Hale M.D on 04/26/2023 at 3:04 PM  Go to [email or web address] - for contact info  [facility] - Office  [phone]  If 7PM-7AM, please contact night-coverage [email or web address] 04/26/2023, 3:04 PM

## 2023-04-26 NOTE — Progress Notes (Signed)
We will proceed with EGD as scheduled.  I thoroughly discussed with the patient the procedure, including the risks involved. Patient understands what the procedure involves including the benefits and any risks. Patient understands alternatives to the proposed procedure. Risks including (but not limited to) bleeding, tearing of the lining (perforation), rupture of adjacent organs, problems with heart and lung function, infection, and medication reactions. A small percentage of complications may require surgery, hospitalization, repeat endoscopic procedure, and/or transfusion.  Patient understood and agreed.  Daniel Castaneda, MD Gastroenterology and Hepatology [facility]  

## 2023-04-26 NOTE — Anesthesia Preprocedure Evaluation (Addendum)
Anesthesia Evaluation  Patient identified by MRN, date of birth, ID band Patient awake    Reviewed: Allergy & Precautions, H&P , NPO status , Patient's Chart, lab work & pertinent test results, reviewed documented beta blocker date and time   Airway Mallampati: II  TM Distance: >3 FB Neck ROM: Full    Dental  (+) Loose, Poor Dentition, Missing, Chipped, Dental Advisory Given,    Pulmonary neg pulmonary ROS   Pulmonary exam normal breath sounds clear to auscultation       Cardiovascular hypertension, Pt. on medications and Pt. on home beta blockers Normal cardiovascular exam Rhythm:Regular Rate:Normal  1. Left ventricular ejection fraction, by estimation, is 60 to 65%. The  left ventricle has normal function. The left ventricle has no regional  wall motion abnormalities. Left ventricular diastolic parameters are  consistent with Grade I diastolic  dysfunction (impaired relaxation).   2. Right ventricular systolic function is normal. The right ventricular  size is normal. Mildly increased right ventricular wall thickness.   3. The mitral valve is degenerative. Trivial mitral valve regurgitation.  No evidence of mitral stenosis.   4. The aortic valve is tricuspid. Aortic valve regurgitation is not  visualized. No aortic stenosis is present.   5. 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     Neuro/Psych negative neurological ROS  negative psych ROS   GI/Hepatic ,GERD  Medicated and Controlled,,(+)     substance abuse  alcohol use  Endo/Other  Hypothyroidism    Renal/GU Renal disease  negative genitourinary   Musculoskeletal negative musculoskeletal ROS (+)    Abdominal   Peds negative pediatric ROS (+)  Hematology  (+) Blood dyscrasia, anemia   Anesthesia Other Findings Right shoulder pain from fall   Reproductive/Obstetrics negative OB ROS                               Anesthesia Physical Anesthesia Plan  ASA: 3  Anesthesia Plan: General   Post-op Pain Management: Minimal or no pain anticipated   Induction: Intravenous  PONV Risk Score and Plan: 1 and Propofol infusion  Airway Management Planned: Nasal Cannula and Natural Airway  Additional Equipment:   Intra-op Plan:   Post-operative Plan: Extubation in OR and Possible Post-op intubation/ventilation  Informed Consent: I have reviewed the patients History and Physical, chart, labs and discussed the procedure including the risks, benefits and alternatives for the proposed anesthesia with the patient or authorized representative who has indicated his/her understanding and acceptance.     Dental advisory given  Plan Discussed with: CRNA and Surgeon  Anesthesia Plan Comments:          Anesthesia Quick Evaluation

## 2023-04-26 NOTE — Anesthesia Postprocedure Evaluation (Signed)
Anesthesia Post Note  Patient: BLAYKE DAM  Procedure(s) Performed: ESOPHAGOGASTRODUODENOSCOPY (EGD) WITH PROPOFOL BIOPSY  Patient location during evaluation: PACU Anesthesia Type: General Level of consciousness: awake and alert and oriented Pain management: pain level controlled Vital Signs Assessment: post-procedure vital signs reviewed and stable Respiratory status: spontaneous breathing, nonlabored ventilation and respiratory function stable Cardiovascular status: blood pressure returned to baseline and stable Postop Assessment: no apparent nausea or vomiting Anesthetic complications: no  No notable events documented.   Last Vitals:  Vitals:   04/26/23 0909 04/26/23 1025  BP: (!) 112/59 126/68  Pulse: 69 66  Resp: 16 16  Temp: (!) 36.4 C   SpO2: 98% 100%    Last Pain:  Vitals:   04/26/23 0856  TempSrc:   PainSc: 3                  Ellen Mayol C Victorious Kundinger

## 2023-04-26 NOTE — Brief Op Note (Addendum)
04/26/2023  9:04 AM  PATIENT:  Judy Davis  67 y.o. female  PRE-OPERATIVE DIAGNOSIS:  melena  POST-OPERATIVE DIAGNOSIS:  esophagitis; 3 cm hiatal hernia; nodules in stomach with biopsies; esophageal biopsies; stricture in distal esophagus  PROCEDURE:  Procedure(s): ESOPHAGOGASTRODUODENOSCOPY (EGD) WITH PROPOFOL (N/A) BIOPSY  SURGEON:  Surgeons and Role:    * Dolores Frame, MD - Primary  Patient underwent EGD under propofol sedation.  Tolerated the procedure adequately.   FINDINGS: - LA Grade C reflux esophagitis with no bleeding.  Biopsied. - Benign-appearing esophageal stenosis.  - 3 cm hiatal hernia.  - A few mucosal papules (nodules) found in the stomach.  Biopsied.  - Normal examined duodenum.   RECOMMENDATIONS - Return patient to hospital ward for ongoing care.  - Await pathology results.  - Use Protonix (pantoprazole) 40 mg PO BID.  - Proceed with capsule endoscopy tomorrow. - Check daily H/H .  Katrinka Blazing, MD Gastroenterology and Hepatology [facility]

## 2023-04-26 NOTE — Transfer of Care (Signed)
Immediate Anesthesia Transfer of Care Note  Patient: Judy Davis  Procedure(s) Performed: ESOPHAGOGASTRODUODENOSCOPY (EGD) WITH PROPOFOL BIOPSY  Patient Location: PACU  Anesthesia Type:General  Level of Consciousness: awake, sedated, drowsy, and patient cooperative  Airway & Oxygen Therapy: Patient Spontanous Breathing and Patient connected to nasal cannula oxygen  Post-op Assessment: Report given to RN and Post -op Vital signs reviewed and stable  Post vital signs: Reviewed and stable  Last Vitals:  Vitals Value Taken Time  BP 101/35 04/26/23 0856  Temp 97.5 04/26/23  0857  Pulse 78 04/26/23 0859  Resp 19 04/26/23 0859  SpO2 100 % 04/26/23 0859  Vitals shown include unfiled device data.  Last Pain:  Vitals:   04/26/23 0838  TempSrc:   PainSc: 0-No pain      Patients Stated Pain Goal: 8 (04/26/23 0810)  Complications: No notable events documented.

## 2023-04-26 NOTE — Plan of Care (Signed)

## 2023-04-26 NOTE — Op Note (Addendum)
[facility] Patient Name: Judy Davis Procedure Date: 04/26/2023 8:00 AM MRN: [id number] Date of Birth: 1956/08/26 Attending MD: Katrinka Blazing , , [id number] CSN: [id number] Age: 67 Admit Type: Inpatient Procedure:                Upper GI endoscopy Indications:              Melena Providers:                Katrinka Blazing, Crystal Page, Lennice Sites                            Technician, Technician Referring MD:              Medicines:                Monitored Anesthesia Care Complications:            No immediate complications. Estimated Blood Loss:     Estimated blood loss: none. Procedure:                Pre-Anesthesia Assessment:                           - Prior to the procedure, a History and Physical                            was performed, and patient medications, allergies                            and sensitivities were reviewed. The patient's                            tolerance of previous anesthesia was reviewed.                           - The risks and benefits of the procedure and the                            sedation options and risks were discussed with the                            patient. All questions were answered and informed                            consent was obtained.                           - ASA Grade Assessment: II - A patient with mild                            systemic disease.                           After obtaining informed consent, the endoscope was                            passed under direct vision. Throughout the  procedure, the patient's blood pressure, pulse, and                            oxygen saturations were monitored continuously. The                            [id number]-H190 ([id number]) scope was introduced through the                            mouth, and advanced to the second part of duodenum.                            The upper GI endoscopy was accomplished without                             difficulty. The patient tolerated the procedure                            well. Scope In: 8:43:12 AM Scope Out: 8:50:26 AM Total Procedure Duration: 0 hours 7 minutes 14 seconds  Findings:      LA Grade C (one or more mucosal breaks continuous between tops of 2 or       more mucosal folds, less than 75% circumference) esophagitis with no       bleeding was found in the distal esophagus. Biopsies were taken with a       cold forceps for histology.      One benign-appearing, intrinsic mild (non-circumferential scarring)       stenosis was found in the lower third of the esophagus. This stenosis       measured 1.8 cm (inner diameter) x less than one cm (in length). The       stenosis was traversed. A cold forceps was used to disrupt the rim of       the stricture.      A 3 cm hiatal hernia was present.      A few small mucosal papules (nodules) with no bleeding were found in the       gastric body. The nodules were Paris classification Is (protruding,       sessile). Biopsies were taken with a cold forceps for histology.      The examined duodenum was normal. Impression:               - LA Grade C reflux esophagitis with no bleeding.                            Biopsied.                           - Benign-appearing esophageal stenosis.                           - 3 cm hiatal hernia.                           - A few mucosal papules (nodules) found in the  stomach. Biopsied.                           - Normal examined duodenum. Moderate Sedation:      Per Anesthesia Care Recommendation:           - Return patient to hospital ward for ongoing care.                           - Await pathology results.                           - Use Protonix (pantoprazole) 40 mg PO BID.                           - Proceed with capsule endoscopy tomorrow.                           - Check daily H/H . Procedure Code(s):        --- Professional ---                            [phone], Esophagogastroduodenoscopy, flexible,                            transoral; with biopsy, single or multiple Diagnosis Code(s):        --- Professional ---                           K21.00, Gastro-esophageal reflux disease with                            esophagitis, without bleeding                           K22.2, Esophageal obstruction                           K44.9, Diaphragmatic hernia without obstruction or                            gangrene                           K31.89, Other diseases of stomach and duodenum                           K92.1, Melena (includes Hematochezia) CPT copyright 2022 [employer]. All rights reserved. The codes documented in this report are preliminary and upon coder review may  be revised to meet current compliance requirements. Katrinka Blazing, MD Katrinka Blazing,  04/26/2023 9:04:17 AM This report has been signed electronically. Number of Addenda: 0

## 2023-04-27 ENCOUNTER — Encounter (HOSPITAL_COMMUNITY)
Admission: EM | Disposition: A | Discharge status: Home Health | Payer: Self-pay | Source: Home / Self Care | Attending: Family Medicine

## 2023-04-27 ENCOUNTER — Encounter (HOSPITAL_COMMUNITY): Payer: Self-pay | Admitting: Family Medicine

## 2023-04-27 DIAGNOSIS — R195 Other fecal abnormalities: Secondary | ICD-10-CM

## 2023-04-27 DIAGNOSIS — R55 Syncope and collapse: Secondary | ICD-10-CM | POA: Diagnosis not present

## 2023-04-27 DIAGNOSIS — D649 Anemia, unspecified: Secondary | ICD-10-CM | POA: Diagnosis not present

## 2023-04-27 HISTORY — PX: GIVENS CAPSULE STUDY: SHX5432

## 2023-04-27 LAB — GLUCOSE, CAPILLARY
Glucose-Capillary: 108 mg/dL — ABNORMAL HIGH (ref 70–99)
Glucose-Capillary: 127 mg/dL — ABNORMAL HIGH (ref 70–99)
Glucose-Capillary: 94 mg/dL (ref 70–99)

## 2023-04-27 SURGERY — IMAGING PROCEDURE, GI TRACT, INTRALUMINAL, VIA CAPSULE
Anesthesia: Monitor Anesthesia Care

## 2023-04-27 MED ORDER — LORAZEPAM 2 MG/ML IJ SOLN: 1.0000 mg | INTRAMUSCULAR | Status: AC | PRN

## 2023-04-27 MED ORDER — LORAZEPAM 1 MG PO TABS: 1.0000 mg | ORAL_TABLET | ORAL | Status: AC | PRN

## 2023-04-27 NOTE — Care Management Important Message (Signed)
Important Message  Patient Details  Name: Judy Davis MRN: [id number] Date of Birth: [birth year]   Medicare Important Message Given:  Yes     Corey Harold 04/27/2023, 1:07 PM

## 2023-04-27 NOTE — Plan of Care (Signed)

## 2023-04-27 NOTE — Progress Notes (Signed)
Gastroenterology Progress Note     Patient ID: HERMALINDA ERTEL; [id number]; 1956-05-06    Subjective   Capsule placed earlier this morning. No abdominal pain, N/V, denies overt GI bleeding. No concerns currently.    Objective   Vital signs in last 24 hours Temp:  [97.7 F (36.5 C)-98 F (36.7 C)] 98 F (36.7 C) (08/12 0533) Pulse Rate:  [66-75] 66 (08/12 0533) Resp:  [16-18] 16 (08/12 0533) BP: (99-131)/(58-72) 131/72 (08/12 0533) SpO2:  [97 %-100 %] 100 % (08/12 0533)    Physical Exam General:   Alert and oriented, pleasant Head:  Normocephalic and atraumatic. Abdomen:  Bowel sounds present, soft, non-tender, non-distended. No HSM or hernias noted. No rebound or guarding. No masses appreciated  Neurologic:  Alert and  oriented x4    Intake/Output from previous day: 08/11 0701 - 08/12 0700 In: 1670 [P.O.:690; I.V.:980] Out: -  Intake/Output this shift: No intake/output data recorded.  Lab Results  Recent Labs    04/25/23 0508 04/26/23 0358 04/27/23 0809  WBC 8.7 7.7 9.0  HGB 8.3* 8.0* 9.2*  HCT 25.8* 25.3* 28.9*  PLT 369 364 474*   BMET Recent Labs    04/25/23 1600 04/26/23 0358 04/26/23 0401  NA 130* 130* 130*  K 3.1* 3.3* 3.3*  CL 96* 100 99  CO2 25 24 21*  GLUCOSE 143* 101* 98  BUN 10 7* 8  CREATININE 0.62 0.55 0.59  CALCIUM 8.8* 8.4* 8.4*   LFT Recent Labs    04/24/23 1030 04/26/23 0401  PROT 8.1  --   ALBUMIN 4.0 3.2*  AST 29  --   ALT 16  --   ALKPHOS 61  --   BILITOT 1.1  --     Studies/Results ECHOCARDIOGRAM COMPLETE  Result Date: 04/25/2023    ECHOCARDIOGRAM REPORT   Patient Name:   MORA MATOUSEK Date of Exam: 04/25/2023 Medical Rec #:  [id number]         Height:       66.0 in Accession #:    [id number]        Weight:       119.6 lb Date of Birth:  1956/05/10          BSA:          1.607 m Patient Age:    67 years          BP:           150/77 mmHg Patient Gender: F                 HR:           65 bpm. [facility] Procedure: 2D Echo, Cardiac Doppler and Color Doppler Indications:    R94.31 Abnormal EKG  History:        Patient has prior history of Echocardiogram examinations, most                 recent 03/01/2020. Abnormal ECG, Arrythmias:Tachycardia,                 Signs/Symptoms:Syncope and Altered Mental Status; Risk                 Factors:Hypertension and Dyslipidemia. ETOH.  Sonographer:    Sheralyn Boatman RDCS Referring Phys: [id number] COURAGE EMOKPAE IMPRESSIONS  1. Left ventricular ejection fraction, by estimation, is 60 to 65%. The left ventricle has normal function. The left ventricle has no regional wall motion abnormalities. Left ventricular  diastolic parameters are consistent with Grade I diastolic dysfunction (impaired relaxation).  2. Right ventricular systolic function is normal. The right ventricular size is normal. Mildly increased right ventricular wall thickness.  3. The mitral valve is degenerative. Trivial mitral valve regurgitation. No evidence of mitral stenosis.  4. The aortic valve is tricuspid. Aortic valve regurgitation is not visualized. No aortic stenosis is present.  5. 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 Comparison(s): No significant change from prior study. Prior images reviewed side by side. FINDINGS  Left Ventricle: Left ventricular ejection fraction, by estimation, is 60 to 65%. The left ventricle has normal function. The left ventricle has no regional wall motion abnormalities. The left ventricular internal cavity size was normal in size. There is  no left ventricular hypertrophy. Left ventricular diastolic parameters are consistent with Grade I diastolic dysfunction (impaired relaxation). Right Ventricle: The right ventricular size is normal. Mildly increased right ventricular wall thickness. Right ventricular systolic function is normal. Left Atrium: Left atrial size was normal in size. Right Atrium: Right atrial size was normal in size. Pericardium:  There is no evidence of pericardial effusion. Mitral Valve: The mitral valve is degenerative in appearance. Trivial mitral valve regurgitation. No evidence of mitral valve stenosis. Tricuspid Valve: The tricuspid valve is normal in structure. Tricuspid valve regurgitation is trivial. No evidence of tricuspid stenosis. Aortic Valve: The aortic valve is tricuspid. Aortic valve regurgitation is not visualized. No aortic stenosis is present. Pulmonic Valve: The pulmonic valve was normal in structure. Pulmonic valve regurgitation is not visualized. No evidence of pulmonic stenosis. Aorta: The aortic root and ascending aorta are structurally normal, with no evidence of dilitation. There is severe (Grade IV) atheroma plaque involving the aortic arch. IAS/Shunts: The interatrial septum is aneurysmal. No atrial level shunt detected by color flow Doppler.  LEFT VENTRICLE PLAX 2D LVIDd:         4.10 cm     Diastology LVIDs:         2.80 cm     LV e' medial:    5.87 cm/s LV PW:         1.00 cm     LV E/e' medial:  10.3 LV IVS:        1.00 cm     LV e' lateral:   6.53 cm/s LVOT diam:     2.00 cm     LV E/e' lateral: 9.2 LV SV:         57 LV SV Index:   36 LVOT Area:     3.14 cm  LV Volumes (MOD) LV vol d, MOD A2C: 46.4 ml LV vol d, MOD A4C: 55.9 ml LV vol s, MOD A2C: 14.5 ml LV vol s, MOD A4C: 20.6 ml LV SV MOD A2C:     32.0 ml LV SV MOD A4C:     55.9 ml LV SV MOD BP:      33.7 ml RIGHT VENTRICLE            IVC RV S prime:     6.64 cm/s  IVC diam: 1.80 cm TAPSE (M-mode): 2.2 cm LEFT ATRIUM             Index        RIGHT ATRIUM           Index LA diam:        2.90 cm 1.80 cm/m   RA Area:     14.40 cm LA Vol (A2C):  21.6 ml 13.44 ml/m  RA Volume:   34.20 ml  21.28 ml/m LA Vol (A4C):   13.1 ml 8.15 ml/m LA Biplane Vol: 18.2 ml 11.32 ml/m  AORTIC VALVE LVOT Vmax:   80.40 cm/s LVOT Vmean:  52.000 cm/s LVOT VTI:    0.182 m  AORTA Ao Root diam: 3.10 cm Ao Asc diam:  3.40 cm MITRAL VALVE               TRICUSPID VALVE MV Area  (PHT): 3.03 cm    TR Peak grad:   20.2 mmHg MV Decel Time: 250 msec    TR Vmax:        225.00 cm/s MV E velocity: 60.20 cm/s MV A velocity: 77.70 cm/s  SHUNTS MV E/A ratio:  0.77        Systemic VTI:  0.18 m                            Systemic Diam: 2.00 cm Riley Lam MD Electronically signed by Riley Lam MD Signature Date/Time: 04/25/2023/1:03:42 PM    Final    US Carotid Bilateral  Result Date: 04/25/2023 CLINICAL DATA:  Syncope, hypertension, history of tobacco abuse, hyperlipidemia EXAM: BILATERAL CAROTID DUPLEX ULTRASOUND TECHNIQUE: Wallace Cullens scale imaging, color Doppler and duplex ultrasound were performed of bilateral carotid and vertebral arteries in the neck. COMPARISON:  None Available. FINDINGS: Criteria: Quantification of carotid stenosis is based on velocity parameters that correlate the residual internal carotid diameter with NASCET-based stenosis levels, using the diameter of the distal internal carotid lumen as the denominator for stenosis measurement. The following velocity measurements were obtained: RIGHT ICA: 77/22 cm/sec CCA: 76/17 cm/sec SYSTOLIC ICA/CCA RATIO:  84 ECA: 1.0 cm/sec LEFT ICA: 97/26 cm/sec CCA: 83/12 cm/sec SYSTOLIC ICA/CCA RATIO:  1.2 ECA: 77 cm/sec RIGHT CAROTID ARTERY: Mild intimal thickening. Eccentric partially calcified plaque at the bifurcation resulting in only mild stenosis. Normal waveforms and color Doppler signal throughout. RIGHT VERTEBRAL ARTERY:  Normal flow direction and waveform. LEFT CAROTID ARTERY: Mild plaque in the bulb extending into ICA and ECA origins resulting in only mild stenosis. Normal waveforms and color Doppler signal throughout. LEFT VERTEBRAL ARTERY:  Normal flow direction and waveform. IMPRESSION: 1. Bilateral carotid bifurcation plaque resulting in less than 50% diameter ICA stenosis. 2. Antegrade bilateral vertebral arterial flow. Electronically Signed   By: Corlis Leak M.D.   On: 04/25/2023 10:16   CT CERVICAL SPINE WO  CONTRAST  Result Date: 04/24/2023 CLINICAL DATA:  Trauma, fall EXAM: CT CERVICAL SPINE WITHOUT CONTRAST TECHNIQUE: Multidetector CT imaging of the cervical spine was performed without intravenous contrast. Multiplanar CT image reconstructions were also generated. RADIATION DOSE REDUCTION: This exam was performed according to the departmental dose-optimization program which includes automated exposure control, adjustment of the mA and/or kV according to patient size and/or use of iterative reconstruction technique. COMPARISON:  03/18/2018 FINDINGS: Alignment: Alignment of posterior margins of vertebral bodies appears normal. Skull base and vertebrae: No recent fracture is seen. Degenerative changes are noted with bony spurs at multiple levels, more prominent at C5-C6 and C6-C7 levels. Soft tissues and spinal canal: There is extrinsic pressure over the ventral margin of thecal sac caused by posterior bony spurs at C5-C6 level extending more to the right. Disc levels: There is encroachment of neural foramina by bony spurs from C3-C7 levels. Upper chest: Scarring is seen in the apices of both lungs. Other: Scattered arterial calcifications are seen. IMPRESSION: No recent fracture  is seen in cervical spine. Cervical spondylosis with encroachment of neural foramina from C3 to C7 levels, more severe at C5-C6 level. Electronically Signed   By: Ernie Avena M.D.   On: 04/24/2023 13:21   CT HEAD WO CONTRAST  Result Date: 04/24/2023 CLINICAL DATA:  Trauma, fall, syncope EXAM: CT HEAD WITHOUT CONTRAST TECHNIQUE: Contiguous axial images were obtained from the base of the skull through the vertex without intravenous contrast. RADIATION DOSE REDUCTION: This exam was performed according to the departmental dose-optimization program which includes automated exposure control, adjustment of the mA and/or kV according to patient size and/or use of iterative reconstruction technique. COMPARISON:  04/20/2023 FINDINGS: Brain:  No acute intracranial findings are seen. There are no signs of bleeding within the cranium. Calcifications are seen in basal ganglia. Cortical sulci are prominent. There is decreased density in periventricular and subcortical white matter. Vascular: Unremarkable. Skull: No fracture is seen in calvarium. Sinuses/Orbits: There are no air-fluid levels or significant mucosal thickening. Old blowout fracture is seen in the floor of the left orbit which has not changed in comparison with the earlier examination done on 03/18/2018. Other: None. IMPRESSION: No acute intracranial findings are seen in noncontrast CT brain. Atrophy. Small-vessel disease. Old blowout fracture is seen in the floor of left orbit. Electronically Signed   By: Ernie Avena M.D.   On: 04/24/2023 13:14   DG Chest 1 View  Result Date: 04/24/2023 CLINICAL DATA:  Syncope.  Fall EXAM: CHEST  1 VIEW COMPARISON:  04/21/2021 FINDINGS: Minimal linear opacity at the lung bases likely scar or atelectasis. No consolidation. Normal cardiopericardial silhouette. No edema, pneumothorax or effusion. Known comminuted right humeral neck fracture. Please correlate with the recent shoulder x-ray of 04/20/2023. IMPRESSION: Slight basilar atelectasis.  No pneumothorax or effusion. Known right proximal humeral fracture Electronically Signed   By: Karen Kays M.D.   On: 04/24/2023 12:51   DG Elbow Complete Right  Result Date: 04/20/2023 CLINICAL DATA:  Right elbow status post fall last night. Proximal right humeral head fracture. EXAM: RIGHT ELBOW - COMPLETE 3+ VIEW COMPARISON:  None Available. FINDINGS: Possible mild elevation of the distal anterior humeral fat pad, possible small elbow joint effusion. Minimal medial elbow joint space narrowing and peripheral degenerative spurring. No acute fracture is seen. No dislocation. Mild-to-moderate atherosclerotic calcifications. IMPRESSION: 1. Possible small elbow joint effusion. No acute fracture is identified. If  there is persistent clinical concern for a radiographically occult fracture, note is made CT would be more sensitive. 2. Minimal medial elbow joint osteoarthritis. Electronically Signed   By: Neita Garnet M.D.   On: 04/20/2023 12:51   DG Clavicle Left  Result Date: 04/20/2023 CLINICAL DATA:  Left chest bruising after fall last night. EXAM: LEFT CLAVICLE - 2+ VIEWS COMPARISON:  AP chest 04/21/2021 FINDINGS: There is vertical, obliqued linear fracture line lucency within the lateral shaft and adjacent head of the left clavicle. No significant displacement. The clavicular joint remains appropriately aligned. Mild glenohumeral joint space narrowing with moderate inferior glenoid degenerative osteophytosis. Mild atherosclerotic calcification within the aortic arch. Old healed fracture of the posterolateral left seventh rib. IMPRESSION: Nondisplaced acute fracture of the lateral shaft and head of the left clavicle. Electronically Signed   By: Neita Garnet M.D.   On: 04/20/2023 11:30   DG Shoulder Right  Result Date: 04/20/2023 CLINICAL DATA:  Right shoulder pain. Right arm pain starting last night when fell. EXAM: RIGHT SHOULDER - 2+ VIEW COMPARISON:  AP chest 04/21/2021 FINDINGS: There is mildly  decreased bone mineralization. There is an acute, moderately impacted, and highly comminuted fracture involving the lateral humeral head and the humeral surgical neck. There is high-grade comminution at the inferolateral aspect of the humeral head. Up to approximately 6 mm anterior displacement of the distal fracture component with respect to the proximal fracture component. The humeral head remains appropriately located with respect to the glenoid fossa. Mild inferior glenoid degenerative osteophytosis. Moderate lateral downsloping of the acromion. IMPRESSION: Acute, moderately impacted, and highly comminuted fracture involving the lateral humeral head and the humeral surgical neck. Electronically Signed   By: Neita Garnet M.D.   On: 04/20/2023 11:28   CT Head Wo Contrast  Result Date: 04/20/2023 CLINICAL DATA:  ams, etoh use EXAM: CT HEAD WITHOUT CONTRAST TECHNIQUE: Contiguous axial images were obtained from the base of the skull through the vertex without intravenous contrast. RADIATION DOSE REDUCTION: This exam was performed according to the departmental dose-optimization program which includes automated exposure control, adjustment of the mA and/or kV according to patient size and/or use of iterative reconstruction technique. COMPARISON:  None Available. FINDINGS: Brain: No evidence of acute infarction, hemorrhage, hydrocephalus, extra-axial collection or mass lesion/mass effect. Sequela of moderate chronic microvascular ischemic change. Mineralization of the basal ganglia on the right. Vascular: No hyperdense vessel or unexpected calcification. Skull: Normal. Negative for fracture or focal lesion. Sinuses/Orbits: No middle ear or mastoid effusion. Paranasal sinuses are clear. Left lens replacement. Orbits are otherwise unremarkable. Other: None. IMPRESSION: No acute intracranial abnormality. Electronically Signed   By: Lorenza Cambridge M.D.   On: 04/20/2023 11:25    Assessment  67 y.o. female with a history of etoh abuse, HTN, hyperlipidemia, hypothyroidism, presenting to the hospital after episode of syncope and found to have worsening anemia, heme positive stool.   Hgb on admission was 8.6, down from 10.1 several days prior. She does have a history of chronic normocytic anemia. Hgb is 9.2 today.   EGD yesterday with LA Grade C reflux esophagitis s/p biopsy, benign-appearing esophageal stenosis, few nodules in stomach s/p biopsy, and normal duodenum. Capsule today, and this will be read tomorrow. Notably, last colonoscopy Dec 2022: internal hemorrhoids, one 10 mm polyp in descendign colon. (Tubular adenoma).   She has had no overt GI bleeding today and no GI concerns currently.    Plan / Recommendations   Capsule study today Will read tomorrow Continue PPI BID May have soft diet AFTER capsule study is complete    LOS: 3 days    04/27/2023, 9:15 AM  Gelene Mink, PhD, ANP-BC [facility]

## 2023-04-27 NOTE — Progress Notes (Signed)
PROGRESS NOTE  Judy Davis, is a 67 y.o. 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, recurrent falls in the setting of alcohol abuse, HTN, hypothyroidism and HLD admitted on 04/24/2023 after another episode of passing out and found to have acute on chronic symptomatic anemia   -Assessment and Plan: 1)Syncope- recurrent falls in the setting of alcohol abuse, -Ruled out for ACS by EKG and serial troponins -On telemetry monitored unit=-no significant  arrhythmias,  -- echocardiogram from 04/25/2023 w/o  significant aortic stenosis or other outflow obstruction, and without segmental/Regional wall motion abnormalities, EF is 60 to 65%,, grade 1 diastolic dysfunction noted  --carotid artery Dopplers without hemodynamically significant stenosis -CT C-spine without acute fractures, patient does not have cervical spondylosis with encroachment of the neural foramina from C3-C7 levels most severe at C5-C6- -CT of the head without acute findings patient does have atrophy and small vessel disease, old blowout fracture is seen in the floor of the left orbit --She was seen in the ED on 04/20/2023 after passing out and sustaining fractures of the right proximal humerus and left clavicle -PT eval after GI workup is completed     2) acute on chronic symptomatic anemia=--- Hgb was 10.1 on 04/20/2023 -Hgb continues to drift down -Stool occult blood is positive in the ER -GI consult appreciated --Ferritin is 154, serum iron is low normal at 32 iron saturation is low at 10 TIBC 321 -Folate and B12 are not low -IV Protonix for now -EGD on 04/26/2023 showed---FINDINGS: - LA Grade C reflux esophagitis with no  bleeding.  Biopsied. - Benign-appearing esophageal stenosis.  - 3 cm hiatal hernia.  - A few mucosal papules (nodules) found in the stomach.  Biopsied.  - Normal examined duodenum.  04/27/23 S/p capsule endoscopy on 04/27/2023--- capsule study will not be read until 04/28/2023   3)Etoh Abuse-- BAL less than 10 --she drinks two Colt 45 12 oz cans daily, occasional liquor  -- High risk for DTs, lorazepam per CIWA protocol -Thiamine and folic acid as ordered   4) chronic hyponatremia/hypochloremia/hypokalemia--suspect alcohol abuse related -Sodium is chronically low at 124 which is not far from her baseline which usually ranges from 124 to 1 28, low at this 90 which is also close to baseline, -Gentle hydration with normal saline -Replace potassium -Monitor closely   5) hypothyroidism--continue levothyroxine, check TSH   6) right proximal humerus fracture/left clavicular fracture--diagnosed on 04/20/2023 after patient fell -Continue to use sling on right upper extremity -Outpatient follow-up with orthopedic surgery advised   7)HTN--amlodipine and metoprolol as ordered  8)PAD---There is Severe (Grade IV) atheroma plaque involving the aortic arch as per echocardiogram from 04/25/2023 -Hold aspirin due to acute GI bleed concerns -Started on atorvastatin on 04/25/2023  9) hypokalemia--- replaced  Status is: Inpatient   Disposition: The patient is from: Home              Anticipated d/c is to: Home              Anticipated d/c date is: 1 day              Patient currently is not medically stable to  d/c. Barriers: Not Clinically Stable-   Code Status :  -  Code Status: Full Code   Family Communication:    NA (patient is alert, awake and coherent)   DVT Prophylaxis  :   - SCDs  SCDs Start: 04/24/23 1915 Place TED hose Start: 04/24/23 1915   Lab Results  Component Value Date   PLT 474 (H) 04/27/2023    Inpatient Medications  Scheduled Meds:  amLODipine  5 mg Oral Daily    atorvastatin  40 mg Oral q1800   folic acid  1 mg Oral Daily   levothyroxine  50 mcg Oral QAC breakfast   metoprolol tartrate  75 mg Oral BID   multivitamin with minerals  1 tablet Oral Daily   pantoprazole (PROTONIX) IV  40 mg Intravenous Q12H   sodium chloride flush  3 mL Intravenous Q12H   thiamine  100 mg Oral Daily   Or   thiamine  100 mg Intravenous Daily   Continuous Infusions:  sodium chloride Stopped (04/26/23 1800)   sodium chloride 40 mL/hr at 04/27/23 1703   PRN Meds:.sodium chloride, acetaminophen **OR** acetaminophen, bisacodyl, fentaNYL (SUBLIMAZE) injection, ondansetron (ZOFRAN) IV, polyethylene glycol, sodium chloride flush, traZODone   Anti-infectives (From admission, onward)    None       Subjective: [name] today has no fevers, no emesis,  No chest pain,   No fever  Or chills -- Tolerated capsule endoscopy placement well -Hungry and wants to eat  Objective: Vitals:   04/27/23 0200 04/27/23 0400 04/27/23 0533 04/27/23 1750  BP:   131/72 120/61  Pulse:   66 74  Resp: 18 16 16    Temp:   98 F (36.7 C) 98.2 F (36.8 C)  TempSrc:   Oral   SpO2:   100% 100%  Weight:      Height:        Intake/Output Summary (Last 24 hours) at 04/27/2023 1812 Last data filed at 04/27/2023 1703 Gross per 24 hour  Intake 970 ml  Output --  Net 970 ml   Filed Weights   04/24/23 0938  Weight: 54.2 kg   Physical Exam Gen:- Awake Alert,  in no apparent distress  HEENT:- [place].AT, No sclera icterus Neck-Supple Neck,No JVD,.  Lungs-  CTAB , fair symmetrical air movement CV- S1, S2 normal, regular  Abd-  +ve B.Sounds, Abd Soft, No tenderness,    Extremity/Skin:- No  edema, pedal pulses present  Psych-affect is appropriate, oriented x3 Neuro-no new focal deficits, no tremors MSK-right upper extremity is in a sling , left clavicular tenderness  Data Reviewed: I have personally reviewed following labs and imaging studies  CBC: Recent Labs  Lab 04/24/23 1030  04/25/23 0508 04/26/23 0358 04/27/23 0809  WBC 11.2* 8.7 7.7 9.0  NEUTROABS 9.0*  --   --   --   HGB 8.6* 8.3* 8.0* 9.2*  HCT 26.0* 25.8* 25.3* 28.9*  MCV 91.9 94.5 94.4 95.1  PLT 355 369 364 474*   Basic Metabolic Panel: Recent Labs  Lab 04/24/23 1030 04/25/23 1600 04/26/23 0358 04/26/23 0401 04/27/23 0809  NA 124* 130* 130* 130* 127*  K 3.5 3.1* 3.3* 3.3* 4.4  CL 90* 96* 100 99 96*  CO2 23 25 24  21* 23  GLUCOSE 123* 143* 101* 98 109*  BUN 14 10 7* 8 6*  CREATININE 0.71 0.62 0.55 0.59 0.66  CALCIUM 9.1 8.8* 8.4* 8.4* 9.3  MG  --   --  1.8  --   --  PHOS  --   --   --  2.7  --    GFR: Estimated Creatinine Clearance: 57.6 mL/min (by C-G formula based on SCr of 0.66 mg/dL). Liver Function Tests: Recent Labs  Lab 04/24/23 1030 04/26/23 0401  AST 29  --   ALT 16  --   ALKPHOS 61  --   BILITOT 1.1  --   PROT 8.1  --   ALBUMIN 4.0 3.2*   Scheduled Meds:  amLODipine  5 mg Oral Daily   atorvastatin  40 mg Oral q1800   folic acid  1 mg Oral Daily   levothyroxine  50 mcg Oral QAC breakfast   metoprolol tartrate  75 mg Oral BID   multivitamin with minerals  1 tablet Oral Daily   pantoprazole (PROTONIX) IV  40 mg Intravenous Q12H   sodium chloride flush  3 mL Intravenous Q12H   thiamine  100 mg Oral Daily   Or   thiamine  100 mg Intravenous Daily   Continuous Infusions:  sodium chloride Stopped (04/26/23 1800)   sodium chloride 40 mL/hr at 04/27/23 1703    LOS: 3 days   Shon Hale M.D on 04/27/2023 at 6:12 PM  Go to [email or web address] - for contact info  [facility] - Office  [phone]  If 7PM-7AM, please contact night-coverage [email or web address] 04/27/2023, 6:12 PM

## 2023-04-27 NOTE — Progress Notes (Signed)
Mobility Specialist Progress Note:    04/27/23 1445  Mobility  Activity Stood at bedside  Level of Assistance Moderate assist, patient does 50-74%  Assistive Device Front wheel walker  Range of Motion/Exercises Active;Right leg;Left leg;Left arm  Activity Response Tolerated well  Mobility Referral Yes  $Mobility charge 1 Mobility  Mobility Specialist Start Time (ACUTE ONLY) 1445  Mobility Specialist Stop Time (ACUTE ONLY) 1455  Mobility Specialist Time Calculation (min) (ACUTE ONLY) 10 min   Pt was received in bed, agreeable to mobility session. Required ModA to stand with RW. Deferred ambulation d/t fatigue, weakness, and anxiety about falling. Stood bedside, tolerated well. Left pt in bed, bed alarm on, call bell in reach. All needs met.   Lawerance Bach Mobility Specialist Please contact via Special educational needs teacher or  Rehab office at [phone]

## 2023-04-28 DIAGNOSIS — R55 Syncope and collapse: Secondary | ICD-10-CM | POA: Diagnosis not present

## 2023-04-28 LAB — GLUCOSE, CAPILLARY
Glucose-Capillary: 126 mg/dL — ABNORMAL HIGH (ref 70–99)
Glucose-Capillary: 115 mg/dL — ABNORMAL HIGH (ref 70–99)
Glucose-Capillary: 153 mg/dL — ABNORMAL HIGH (ref 70–99)
Glucose-Capillary: 156 mg/dL — ABNORMAL HIGH (ref 70–99)

## 2023-04-28 MED ORDER — SODIUM CHLORIDE 0.9 % IV SOLN: INTRAVENOUS | Status: AC

## 2023-04-28 NOTE — Op Note (Addendum)
Small Bowel [place] Capsule Study Procedure date:  04/27/23  Referring Provider:  Dr. Levon Hedger  PCP:  Dr. Randell Patient, Judy [facility]  Indication for procedure:  Melena, anemia  Patient data:  Wt: 54.2 kg Ht: 5\' 6"    Findings:   Few scattered lymphangiectasia's throughout the small bowel, 2 possible pylorus cyst at 04:42:23 and 04:49:48.  No AVMs, erosions, masses, or overt bleeding.  Capsule reached the cecum.  First Gastric image:  00:12:09 First Duodenal image: 00:47:42 First Cecal image: 09:33:42 Gastric Passage time: 0h 48m Small Bowel Passage time:  8h 16m  Summary & Recommendations: 67 year old female with a history of etoh abuse, HTN, hyperlipidemia, hypothyroidism, currently admitted to the [facility] after an episode of syncope and found to have worsening anemia with heme positive stool, reported melena.  EGD completed 8/11 with grade C reflux esophagitis, benign-appearing esophageal stenosis, few nodules in the stomach biopsied, normal duodenum.    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  Her hemoglobin has trended down to 7.6 this morning though this may be dilutional. She has had no further overt bleeding since admission.   At this point, recommend colonoscopy to complete GI work-up.  I have discussed results and recommendations with patient.  She is agreeable to proceeding with colonoscopy.  Unfortunately, she has been on a soft diet today and did eat a hamburger for lunch, so we will need to postpone prepping for colonoscopy to tomorrow with plans for colonoscopy on Thursday. She will continue PPI BID for esophagitis.   Ermalinda Memos, PA-C [facility]   I have reviewed the note and agree with the APP's assessment as described in this progress note  Katrinka Blazing, MD Gastroenterology and Hepatology [facility]  Gastroenterology

## 2023-04-28 NOTE — Progress Notes (Signed)
Mobility Specialist Progress Note:    04/28/23 1425  Mobility  Activity Ambulated with assistance in hallway  Level of Assistance Contact guard assist, steadying assist  Assistive Device Front wheel walker  Distance Ambulated (ft) 60 ft  Range of Motion/Exercises Active;All extremities  Activity Response Tolerated well  Mobility Referral Yes  $Mobility charge 1 Mobility  Mobility Specialist Start Time (ACUTE ONLY) 1425  Mobility Specialist Stop Time (ACUTE ONLY) 1435  Mobility Specialist Time Calculation (min) (ACUTE ONLY) 10 min   Pt was received in bed, agreeable to mobility. Required MinA to stand and CGA to ambulate. Tolerated well, asx throughout. Returned pt to bed, all needs met.   Lawerance Bach Mobility Specialist Please contact via Special educational needs teacher or  Rehab office at [phone]

## 2023-04-28 NOTE — Progress Notes (Addendum)
PROGRESS NOTE  Judy [facility], is a 67 y.o. 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, recurrent falls in the setting of alcohol abuse, HTN, hypothyroidism and HLD admitted on 04/24/2023 after another episode of passing out and found to have acute on chronic symptomatic anemia -Awaiting colonoscopy and completion of GI workup prior to discharge   -Assessment and Plan: 1)Syncope- recurrent falls in the setting of alcohol abuse, -Ruled out for ACS by EKG and serial troponins -On telemetry monitored unit=-no significant  arrhythmias,  -- echocardiogram from 04/25/2023 w/o  significant aortic stenosis or other outflow obstruction, and without segmental/Regional wall motion abnormalities, EF is 60 to 65%,, grade 1 diastolic dysfunction noted  --carotid artery Dopplers without hemodynamically significant stenosis -CT C-spine without acute fractures, patient does not have cervical spondylosis with encroachment of the neural foramina from C3-C7 levels most severe at C5-C6- -CT of the head without acute findings patient does have atrophy and small vessel disease, old blowout fracture is seen in the floor of the left orbit --She was seen in the ED on 04/20/2023 after passing out and sustaining fractures of the right proximal humerus and left clavicle -PT eval after GI workup is completed     2) acute on chronic symptomatic anemia=--- Hgb was 10.1 on 04/20/2023 -Hgb continues to drift down partly due to hemodilution suspect some degree of acute blood loss anemia -Stool occult blood is positive in the ER -GI consult appreciated --Ferritin is 154, serum iron is low normal at 32 iron saturation is low  at 10 TIBC 321 -Folate and B12 are not low -IV Protonix for now -EGD on 04/26/2023 showed---FINDINGS: - LA Grade C reflux esophagitis with no bleeding.  Biopsied. - Benign-appearing esophageal stenosis.  - 3 cm hiatal hernia.  - A few mucosal papules (nodules) found in the stomach.  Biopsied.  - Normal examined duodenum.  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   -Hgb trending down -GI team request colonoscopy and completion of GI workup prior to discharge   3)Etoh Abuse-- BAL less than 10 --she drinks two [employer]t 45 12 oz cans daily, occasional liquor  -- High risk for DTs, lorazepam per CIWA protocol -Thiamine and folic acid as ordered   4) chronic hyponatremia/hypochloremia/hypokalemia--suspect alcohol abuse related -Sodium is chronically low at 124 which is not far from her baseline which usually ranges from 124 to 1 28, low at this 90 which is also close to baseline, -Gentle hydration with normal saline -Replaced potassium -Monitor closely   5) hypothyroidism--continue levothyroxine, check TSH   6) right proximal humerus fracture/left clavicular fracture--diagnosed on 04/20/2023 after patient fell -Continue to use sling on right upper extremity -Outpatient follow-up with orthopedic surgery advised   7)HTN--amlodipine and metoprolol as ordered  8)PAD---There is Severe (Grade IV) atheroma plaque involving the aortic arch as per echocardiogram from 04/25/2023 -Hold aspirin due to acute GI bleed concerns -Started on atorvastatin on 04/25/2023  Status is: Inpatient   Disposition: The patient is from: Home  Anticipated d/c is to: Home----GI team request colonoscopy and completion of GI workup prior to discharge              Anticipated d/c date is: 1 day              Patient currently is not medically stable to  d/c. Barriers: Not Clinically Stable-   Code Status :  -  Code Status: Full Code   Family Communication:    NA (patient is alert, awake and coherent)   DVT Prophylaxis  :   - SCDs  SCDs Start: 04/24/23 1915 Place TED hose Start: 04/24/23 1915   Lab Results  Component Value Date   PLT 419 (H) 04/28/2023    Inpatient Medications  Scheduled Meds:  amLODipine  5 mg Oral Daily   atorvastatin  40 mg Oral q1800   folic acid  1 mg Oral Daily   levothyroxine  50 mcg Oral QAC breakfast   metoprolol tartrate  75 mg Oral BID   multivitamin with minerals  1 tablet Oral Daily   pantoprazole (PROTONIX) IV  40 mg Intravenous Q12H   sodium chloride flush  3 mL Intravenous Q12H   thiamine  100 mg Oral Daily   Or   thiamine  100 mg Intravenous Daily   Continuous Infusions:  sodium chloride Stopped (04/26/23 1800)   sodium chloride 150 mL/hr at 04/28/23 1022   PRN Meds:.sodium chloride, acetaminophen **OR** acetaminophen, bisacodyl, fentaNYL (SUBLIMAZE) injection, LORazepam **OR** LORazepam, ondansetron (ZOFRAN) IV, polyethylene glycol, sodium chloride flush, traZODone   Anti-infectives (From admission, onward)    None       Subjective: Judy [facility] today has no fevers, no emesis,  No chest pain,   No fever  Or chills -No BM -No additional new concerns --GI team request colonoscopy and completion of GI workup prior to discharge  Objective: Vitals:   04/27/23 1750 04/27/23 2108 04/28/23 0532 04/28/23 1300  BP: 120/61 105/60 115/69 (!) 116/58  Pulse: 74 80 70 71  Resp:  15 18 17   Temp: 98.2 F (36.8 C) 97.8 F (36.6 C) 98.3 F (36.8 C) 98.2 F (36.8 C)  TempSrc:  Oral  Oral  SpO2: 100% 100% 100% 100%  Weight:      Height:        Intake/Output Summary (Last 24 hours) at 04/28/2023 1729 Last data filed at 04/28/2023 0900 Gross per 24 hour  Intake 858 ml  Output --  Net 858 ml   Filed Weights   04/24/23 0938  Weight: 54.2 kg   Physical Exam Gen:- Awake Alert,   in no apparent distress  HEENT:- [facility].AT, No sclera icterus Neck-Supple Neck,No JVD,.  Lungs-  CTAB , fair symmetrical air movement CV- S1, S2 normal, regular  Abd-  +ve B.Sounds, Abd Soft, No tenderness,    Extremity/Skin:- No  edema, pedal pulses present  Psych-affect is appropriate, oriented x3 Neuro-no new focal deficits, no tremors MSK-right upper extremity is in a sling , left clavicular tenderness  Data Reviewed: I have personally reviewed following labs and imaging studies  CBC: Recent Labs  Lab 04/24/23 1030 04/25/23 0508 04/26/23 0358 04/27/23 0809 04/28/23 0428  WBC 11.2* 8.7 7.7 9.0 7.7  NEUTROABS 9.0*  --   --   --   --   HGB 8.6* 8.3* 8.0* 9.2* 7.6*  HCT 26.0* 25.8* 25.3* 28.9* 23.8*  MCV 91.9 94.5 94.4 95.1 93.3  PLT 355 369 364 474* 419*   Basic Metabolic Panel: Recent Labs  Lab 04/25/23 1600 04/26/23 0358 04/26/23 0401 04/27/23 0809 04/28/23 0428  NA 130* 130* 130* 127* 127*  K 3.1* 3.3* 3.3* 4.4 3.6  CL 96* 100 99 96* 97*  CO2 25 24 21* 23 23  GLUCOSE 143* 101* 98 109* 113*  BUN 10 7* 8 6* 9  CREATININE 0.62 0.55 0.59 0.66 0.75  CALCIUM 8.8* 8.4* 8.4* 9.3 8.7*  MG  --  1.8  --   --   --   PHOS  --   --  2.7  --  3.2   GFR: Estimated Creatinine Clearance: 57.6 mL/min (by C-G formula based on SCr of 0.75 mg/dL). Liver Function Tests: Recent Labs  Lab 04/24/23 1030 04/26/23 0401 04/28/23 0428  AST 29  --   --   ALT 16  --   --   ALKPHOS 61  --   --   BILITOT 1.1  --   --   PROT 8.1  --   --   ALBUMIN 4.0 3.2* 3.2*   Scheduled Meds:  amLODipine  5 mg Oral Daily   atorvastatin  40 mg Oral q1800   folic acid  1 mg Oral Daily   levothyroxine  50 mcg Oral QAC breakfast   metoprolol tartrate  75 mg Oral BID   multivitamin with minerals  1 tablet Oral Daily   pantoprazole (PROTONIX) IV  40 mg Intravenous Q12H   sodium chloride flush  3 mL Intravenous Q12H   thiamine  100 mg Oral Daily   Or   thiamine  100 mg Intravenous Daily    Continuous Infusions:  sodium chloride Stopped (04/26/23 1800)   sodium chloride 150 mL/hr at 04/28/23 1022    LOS: 4 days   Shon Hale M.D on 04/28/2023 at 5:29 PM  Go to [email or web address] - for contact info  [facility] - Office  [phone]  If 7PM-7AM, please contact night-coverage [email or web address] 04/28/2023, 5:29 PM

## 2023-04-29 ENCOUNTER — Encounter (HOSPITAL_COMMUNITY): Payer: Self-pay | Admitting: Anesthesiology

## 2023-04-29 ENCOUNTER — Encounter (HOSPITAL_COMMUNITY): Payer: Self-pay | Admitting: Gastroenterology

## 2023-04-29 DIAGNOSIS — K6389 Other specified diseases of intestine: Secondary | ICD-10-CM

## 2023-04-29 DIAGNOSIS — K921 Melena: Secondary | ICD-10-CM | POA: Diagnosis not present

## 2023-04-29 DIAGNOSIS — R55 Syncope and collapse: Secondary | ICD-10-CM | POA: Diagnosis not present

## 2023-04-29 LAB — GLUCOSE, CAPILLARY
Glucose-Capillary: 104 mg/dL — ABNORMAL HIGH (ref 70–99)
Glucose-Capillary: 112 mg/dL — ABNORMAL HIGH (ref 70–99)
Glucose-Capillary: 124 mg/dL — ABNORMAL HIGH (ref 70–99)
Glucose-Capillary: 133 mg/dL — ABNORMAL HIGH (ref 70–99)

## 2023-04-29 MED ORDER — PEG 3350-KCL-NA BICARB-NACL 420 G PO SOLR
4000.0000 mL | Freq: Once | ORAL | Status: AC
  Administered 2023-04-29: 4000 mL via ORAL

## 2023-04-29 NOTE — Plan of Care (Signed)

## 2023-04-29 NOTE — Progress Notes (Signed)
Patient has had no acute events over night.  Patient has voided several times, vitals have been stable.  Patient still c/o soreness in right arm. No new complaints this shift.

## 2023-04-29 NOTE — Progress Notes (Signed)
PROGRESS NOTE  Judy Davis, is a 67 y.o. 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 past medical history relevant for alcohol abuse, recurrent falls in the setting of alcohol abuse, HTN, hypothyroidism and HLD admitted on 04/24/2023 after another episode of passing out and found to have acute on chronic symptomatic anemia -Awaiting colonoscopy and completion of GI workup prior to discharge   -Assessment and Plan: 1)Syncope- recurrent falls in the setting of alcohol abuse, -Ruled out for ACS by EKG and serial troponins -On telemetry monitored unit=-no significant  arrhythmias,  -- echocardiogram from 04/25/2023 w/o  significant aortic stenosis or other outflow obstruction, and without segmental/Regional wall motion abnormalities, EF is 60 to 65%,, grade 1 diastolic dysfunction noted  --carotid artery Dopplers without hemodynamically significant stenosis -CT C-spine without acute fractures, patient does not have cervical spondylosis with encroachment of the neural foramina from C3-C7 levels most severe at C5-C6- -CT of the head without acute findings patient does have atrophy and small vessel disease, old blowout fracture is seen in the floor of the left orbit --She was seen in the ED on 04/20/2023 after passing out and sustaining fractures of the right proximal humerus and left clavicle -Get physical therapy eval     2) acute on chronic symptomatic anemia=--- Hgb was 10.1 on 04/20/2023 -Hgb continues to drift down partly due to hemodilution suspect some degree of acute blood loss anemia -Stool occult blood is positive in the ER -GI consult appreciated --Ferritin is 154, serum iron is low normal at 32 iron saturation is low at 10 TIBC  321 -Folate and B12 are not low -IV Protonix for now -EGD on 04/26/2023 showed---FINDINGS: - LA Grade C reflux esophagitis with no bleeding.  Biopsied. - Benign-appearing esophageal stenosis.  - 3 cm hiatal hernia.  - A few mucosal papules (nodules) found in the stomach.  Biopsied.  - Normal examined duodenum.  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   -Hgb trending down -GI team request colonoscopy and completion of GI workup prior to discharge   3)Etoh Abuse-- BAL less than 10 --she drinks two [employer] 12 oz cans daily, occasional liquor  -- High risk for DTs, lorazepam per CIWA protocol -Thiamine and folic acid as ordered   4) chronic hyponatremia/hypochloremia/hypokalemia--suspect alcohol abuse related -Sodium is chronically low at 124 which is not far from her baseline which usually ranges from 124 to 1 28, low at this 90 which is also close to baseline, -Gentle hydration with normal saline -Replaced potassium -Monitor closely   5) hypothyroidism--continue levothyroxine, check TSH   6) right proximal humerus fracture/left clavicular fracture--diagnosed on 04/20/2023 after patient fell -Continue to use sling on right upper extremity -Outpatient follow-up with orthopedic surgery advised   7)HTN--amlodipine and metoprolol as ordered  8)PAD---There is Severe (Grade IV) atheroma plaque involving the aortic arch as per echocardiogram from 04/25/2023 -Hold aspirin due to acute GI bleed concerns -Started on atorvastatin on 04/25/2023  Status is: Inpatient   Disposition: The patient is from: Home  Anticipated d/c is to: Home----GI team request colonoscopy and completion of GI workup prior to discharge              Anticipated d/c date is: 1 day              Patient currently is not medically stable to  d/c. Barriers: Not Clinically Stable-   Code Status :  -  Code Status: Full Code   Family Communication:    NA (patient is alert, awake and coherent)   DVT Prophylaxis  :   - SCDs  SCDs Start: 04/24/23 1915 Place TED hose Start: 04/24/23 1915   Lab Results  Component Value Date   PLT 409 (H) 04/29/2023    Inpatient Medications  Scheduled Meds:  amLODipine  5 mg Oral Daily   atorvastatin  40 mg Oral q1800   folic acid  1 mg Oral Daily   levothyroxine  50 mcg Oral QAC breakfast   metoprolol tartrate  75 mg Oral BID   multivitamin with minerals  1 tablet Oral Daily   pantoprazole (PROTONIX) IV  40 mg Intravenous Q12H   sodium chloride flush  3 mL Intravenous Q12H   thiamine  100 mg Oral Daily   Or   thiamine  100 mg Intravenous Daily   Continuous Infusions:  sodium chloride Stopped (04/26/23 1800)   PRN Meds:.sodium chloride, acetaminophen **OR** acetaminophen, bisacodyl, LORazepam **OR** LORazepam, ondansetron (ZOFRAN) IV, polyethylene glycol, sodium chloride flush, traZODone   Anti-infectives (From admission, onward)    None       Subjective: [name] today has no fevers, no emesis,  No chest pain,   - Tolerating liquid diet okay --GI team request colonoscopy and completion of GI workup prior to discharge  Objective: Vitals:   04/29/23 0531 04/29/23 0725 04/29/23 0922 04/29/23 1423  BP: (!) 140/64 (!) 140/64 (!) 143/77 122/70  Pulse: 68 68 72 69  Resp: 16   17  Temp: 98.9 F (37.2 C)   98 F (36.7 C)  TempSrc:    Oral  SpO2: 100%   100%  Weight:      Height:        Intake/Output Summary (Last 24 hours) at 04/29/2023 2035 Last data filed at 04/29/2023 1920 Gross per 24 hour  Intake 960 ml  Output --  Net 960 ml   Filed Weights   04/24/23 0938  Weight: 54.2 kg   Physical Exam Gen:- Awake Alert,  in no apparent distress  HEENT:- [place].AT, No sclera icterus Neck-Supple Neck,No JVD,.  Lungs-  CTAB , fair symmetrical air movement CV- S1, S2  normal, regular  Abd-  +ve B.Sounds, Abd Soft, No tenderness,    Extremity/Skin:- No  edema, pedal pulses present  Psych-affect is appropriate, oriented x3 Neuro-no new focal deficits, no tremors MSK-right upper extremity is in a sling , left clavicular tenderness  Data Reviewed: I have personally reviewed following labs and imaging studies  CBC: Recent Labs  Lab 04/24/23 1030 04/25/23 0508 04/26/23 0358 04/27/23 0809 04/28/23 0428 04/29/23 0427  WBC 11.2* 8.7 7.7 9.0 7.7 9.1  NEUTROABS 9.0*  --   --   --   --   --   HGB 8.6* 8.3* 8.0* 9.2* 7.6* 8.3*  HCT 26.0* 25.8* 25.3* 28.9* 23.8* 26.1*  MCV 91.9 94.5 94.4 95.1 93.3 94.6  PLT 355 369 364 474* 419* 409*   Basic Metabolic Panel: Recent Labs  Lab 04/26/23 0358 04/26/23 0401 04/27/23 0809 04/28/23 0428 04/29/23 1610  NA 130* 130* 127* 127* 129*  K 3.3* 3.3* 4.4 3.6 3.5  CL 100 99 96* 97* 100  CO2 24 21* 23 23 22   GLUCOSE 101* 98 109* 113* 117*  BUN 7* 8 6* 9 7*  CREATININE 0.55 0.59 0.66 0.75 0.79  CALCIUM 8.4* 8.4* 9.3 8.7* 8.8*  MG 1.8  --   --   --   --   PHOS  --  2.7  --  3.2  --    GFR: Estimated Creatinine Clearance: 57.6 mL/min (by C-G formula based on SCr of 0.79 mg/dL). Liver Function Tests: Recent Labs  Lab 04/24/23 1030 04/26/23 0401 04/28/23 0428  AST 29  --   --   ALT 16  --   --   ALKPHOS 61  --   --   BILITOT 1.1  --   --   PROT 8.1  --   --   ALBUMIN 4.0 3.2* 3.2*   Scheduled Meds:  amLODipine  5 mg Oral Daily   atorvastatin  40 mg Oral q1800   folic acid  1 mg Oral Daily   levothyroxine  50 mcg Oral QAC breakfast   metoprolol tartrate  75 mg Oral BID   multivitamin with minerals  1 tablet Oral Daily   pantoprazole (PROTONIX) IV  40 mg Intravenous Q12H   sodium chloride flush  3 mL Intravenous Q12H   thiamine  100 mg Oral Daily   Or   thiamine  100 mg Intravenous Daily   Continuous Infusions:  sodium chloride Stopped (04/26/23 1800)    LOS: 5 days   Shon Hale M.D on  04/29/2023 at 8:35 PM  Go to [email or web address] - for contact info  [facility] - Office  [phone]  If 7PM-7AM, please contact night-coverage [email or web address] 04/29/2023, 8:35 PM

## 2023-04-29 NOTE — Progress Notes (Signed)
Discussed EGD biopsy results with the patient, she was made aware that polyps are all benign and we can further discuss repeat EGD to remove other small hyperplastic polyps on outpatient basis. Patient verbalized understanding.

## 2023-04-30 ENCOUNTER — Encounter (HOSPITAL_COMMUNITY)
Admission: EM | Disposition: A | Discharge status: Home Health | Payer: Self-pay | Source: Home / Self Care | Attending: Family Medicine

## 2023-04-30 DIAGNOSIS — R195 Other fecal abnormalities: Secondary | ICD-10-CM | POA: Diagnosis not present

## 2023-04-30 DIAGNOSIS — R55 Syncope and collapse: Secondary | ICD-10-CM | POA: Diagnosis not present

## 2023-04-30 DIAGNOSIS — D649 Anemia, unspecified: Secondary | ICD-10-CM | POA: Diagnosis not present

## 2023-04-30 LAB — GLUCOSE, CAPILLARY
Glucose-Capillary: 112 mg/dL — ABNORMAL HIGH (ref 70–99)
Glucose-Capillary: 98 mg/dL (ref 70–99)
Glucose-Capillary: 93 mg/dL (ref 70–99)
Glucose-Capillary: 106 mg/dL — ABNORMAL HIGH (ref 70–99)

## 2023-04-30 LAB — CBC
HCT: 27.2 % — ABNORMAL LOW (ref 36.0–46.0)
Hemoglobin: 8.6 g/dL — ABNORMAL LOW (ref 12.0–15.0)
MCH: 30.1 pg (ref 26.0–34.0)
MCHC: 31.6 g/dL (ref 30.0–36.0)
MCV: 95.1 fL (ref 80.0–100.0)
Platelets: 417 10*3/uL — ABNORMAL HIGH (ref 150–400)
RBC: 2.86 MIL/uL — ABNORMAL LOW (ref 3.87–5.11)
RDW: 13.2 % (ref 11.5–15.5)
WBC: 8.5 10*3/uL (ref 4.0–10.5)
nRBC: 0 % (ref 0.0–0.2)

## 2023-04-30 SURGERY — COLONOSCOPY WITH PROPOFOL
Anesthesia: Monitor Anesthesia Care

## 2023-04-30 MED ORDER — BISACODYL 5 MG PO TBEC
15.0000 mg | DELAYED_RELEASE_TABLET | Freq: Once | ORAL | Status: AC
  Administered 2023-04-30: 15 mg via ORAL
  Filled 2023-04-30: qty 3

## 2023-04-30 MED ORDER — PEG 3350-KCL-NA BICARB-NACL 420 G PO SOLR
4000.0000 mL | Freq: Once | ORAL | Status: AC
  Administered 2023-04-30: 4000 mL via ORAL

## 2023-04-30 NOTE — Progress Notes (Signed)
Gastroenterology Progress Note   Referring Provider: No ref. provider found Primary Care Physician:  Tylene Fantasia., PA-C   Patient ID: Judy Davis; [id number]; [birth year]   Subjective:    Patient agreeable to completing bowel prep. She only had one glass last night. She reports that she was full, and fell asleep. She has about 3/4 liquid still in the container but not clear if she drank more this morning. She states she iss unable to pour prep with her broken arm and will need assistance. She had four stools in the last 24 hours.   Objective:   Vital signs in last 24 hours: Temp:  [97.5 F (36.4 C)-98.1 F (36.7 C)] 98.1 F (36.7 C) (08/15 1236) Pulse Rate:  [64-70] 64 (08/15 1236) Resp:  [16-20] 20 (08/15 1236) BP: (117-134)/(64-73) 134/70 (08/15 1236) SpO2:  [100 %] 100 % (08/15 1236) Last BM Date : 04/30/23 General:   Alert,  Well-developed, well-nourished, pleasant and cooperative in NAD Head:  Normocephalic and atraumatic. Eyes:  Sclera clear, no icterus.   Abdomen:  Soft, nontender and nondistended.     Extremities:  Without  edema. Neurologic:  Alert and  oriented x4;  grossly normal neurologically. Psych:  Alert and cooperative. Normal mood and affect.  Intake/Output from previous day: 08/14 0701 - 08/15 0700 In: 960 [P.O.:960] Out: -  Intake/Output this shift: No intake/output data recorded.  Lab Results: CBC Recent Labs    04/28/23 0428 04/29/23 0427 04/30/23 0412  WBC 7.7 9.1 8.5  HGB 7.6* 8.3* 8.6*  HCT 23.8* 26.1* 27.2*  MCV 93.3 94.6 95.1  PLT 419* 409* 417*   BMET Recent Labs    04/28/23 0428 04/29/23 0427  NA 127* 129*  K 3.6 3.5  CL 97* 100  CO2 23 22  GLUCOSE 113* 117*  BUN 9 7*  CREATININE 0.75 0.79  CALCIUM 8.7* 8.8*   LFTs Recent Labs    04/28/23 0428  ALBUMIN 3.2*   No results for input(s): "LIPASE" in the last 72 hours. PT/INR No results for input(s): "LABPROT", "INR" in the last 72 hours.       Imaging  Studies: ECHOCARDIOGRAM COMPLETE  Result Date: 04/25/2023    ECHOCARDIOGRAM REPORT   Patient Name:   Judy Davis Date of Exam: 04/25/2023 Medical Rec #:  [id number]         Height:       66.0 in Accession #:    [id number]        Weight:       119.6 lb Date of Birth:  07-May-1956          BSA:          1.607 m Patient Age:    67 years          BP:           150/77 mmHg Patient Gender: F                 HR:           65 bpm. [facility] Procedure: 2D Echo, Cardiac Doppler and Color Doppler Indications:    R94.31 Abnormal EKG  History:        Patient has prior history of Echocardiogram examinations, most                 recent 03/01/2020. Abnormal ECG, Arrythmias:Tachycardia,                 Signs/Symptoms:Syncope  and Altered Mental Status; Risk                 Factors:Hypertension and Dyslipidemia. ETOH.  Sonographer:    Sheralyn Boatman RDCS Referring Phys: [id number] COURAGE EMOKPAE IMPRESSIONS  1. Left ventricular ejection fraction, by estimation, is 60 to 65%. The left ventricle has normal function. The left ventricle has no regional wall motion abnormalities. Left ventricular diastolic parameters are consistent with Grade I diastolic dysfunction (impaired relaxation).  2. Right ventricular systolic function is normal. The right ventricular size is normal. Mildly increased right ventricular wall thickness.  3. The mitral valve is degenerative. Trivial mitral valve regurgitation. No evidence of mitral stenosis.  4. The aortic valve is tricuspid. Aortic valve regurgitation is not visualized. No aortic stenosis is present.  5. 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 Comparison(s): No significant change from prior study. Prior images reviewed side by side. FINDINGS  Left Ventricle: Left ventricular ejection fraction, by estimation, is 60 to 65%. The left ventricle has normal function. The left ventricle has no regional wall motion abnormalities. The left ventricular internal  cavity size was normal in size. There is  no left ventricular hypertrophy. Left ventricular diastolic parameters are consistent with Grade I diastolic dysfunction (impaired relaxation). Right Ventricle: The right ventricular size is normal. Mildly increased right ventricular wall thickness. Right ventricular systolic function is normal. Left Atrium: Left atrial size was normal in size. Right Atrium: Right atrial size was normal in size. Pericardium: There is no evidence of pericardial effusion. Mitral Valve: The mitral valve is degenerative in appearance. Trivial mitral valve regurgitation. No evidence of mitral valve stenosis. Tricuspid Valve: The tricuspid valve is normal in structure. Tricuspid valve regurgitation is trivial. No evidence of tricuspid stenosis. Aortic Valve: The aortic valve is tricuspid. Aortic valve regurgitation is not visualized. No aortic stenosis is present. Pulmonic Valve: The pulmonic valve was normal in structure. Pulmonic valve regurgitation is not visualized. No evidence of pulmonic stenosis. Aorta: The aortic root and ascending aorta are structurally normal, with no evidence of dilitation. There is severe (Grade IV) atheroma plaque involving the aortic arch. IAS/Shunts: The interatrial septum is aneurysmal. No atrial level shunt detected by color flow Doppler.  LEFT VENTRICLE PLAX 2D LVIDd:         4.10 cm     Diastology LVIDs:         2.80 cm     LV e' medial:    5.87 cm/s LV PW:         1.00 cm     LV E/e' medial:  10.3 LV IVS:        1.00 cm     LV e' lateral:   6.53 cm/s LVOT diam:     2.00 cm     LV E/e' lateral: 9.2 LV SV:         57 LV SV Index:   36 LVOT Area:     3.14 cm  LV Volumes (MOD) LV vol d, MOD A2C: 46.4 ml LV vol d, MOD A4C: 55.9 ml LV vol s, MOD A2C: 14.5 ml LV vol s, MOD A4C: 20.6 ml LV SV MOD A2C:     32.0 ml LV SV MOD A4C:     55.9 ml LV SV MOD BP:      33.7 ml RIGHT VENTRICLE            IVC RV S prime:     6.64 cm/s  IVC diam: 1.80  cm TAPSE (M-mode): 2.2 cm LEFT  ATRIUM             Index        RIGHT ATRIUM           Index LA diam:        2.90 cm 1.80 cm/m   RA Area:     14.40 cm LA Vol (A2C):   21.6 ml 13.44 ml/m  RA Volume:   34.20 ml  21.28 ml/m LA Vol (A4C):   13.1 ml 8.15 ml/m LA Biplane Vol: 18.2 ml 11.32 ml/m  AORTIC VALVE LVOT Vmax:   80.40 cm/s LVOT Vmean:  52.000 cm/s LVOT VTI:    0.182 m  AORTA Ao Root diam: 3.10 cm Ao Asc diam:  3.40 cm MITRAL VALVE               TRICUSPID VALVE MV Area (PHT): 3.03 cm    TR Peak grad:   20.2 mmHg MV Decel Time: 250 msec    TR Vmax:        225.00 cm/s MV E velocity: 60.20 cm/s MV A velocity: 77.70 cm/s  SHUNTS MV E/A ratio:  0.77        Systemic VTI:  0.18 m                            Systemic Diam: 2.00 cm Riley Lam MD Electronically signed by Riley Lam MD Signature Date/Time: 04/25/2023/1:03:42 PM    Final    US Carotid Bilateral  Result Date: 04/25/2023 CLINICAL DATA:  Syncope, hypertension, history of tobacco abuse, hyperlipidemia EXAM: BILATERAL CAROTID DUPLEX ULTRASOUND TECHNIQUE: Wallace Cullens scale imaging, color Doppler and duplex ultrasound were performed of bilateral carotid and vertebral arteries in the neck. COMPARISON:  None Available. FINDINGS: Criteria: Quantification of carotid stenosis is based on velocity parameters that correlate the residual internal carotid diameter with NASCET-based stenosis levels, using the diameter of the distal internal carotid lumen as the denominator for stenosis measurement. The following velocity measurements were obtained: RIGHT ICA: 77/22 cm/sec CCA: 76/17 cm/sec SYSTOLIC ICA/CCA RATIO:  84 ECA: 1.0 cm/sec LEFT ICA: 97/26 cm/sec CCA: 83/12 cm/sec SYSTOLIC ICA/CCA RATIO:  1.2 ECA: 77 cm/sec RIGHT CAROTID ARTERY: Mild intimal thickening. Eccentric partially calcified plaque at the bifurcation resulting in only mild stenosis. Normal waveforms and color Doppler signal throughout. RIGHT VERTEBRAL ARTERY:  Normal flow direction and waveform. LEFT CAROTID ARTERY:  Mild plaque in the bulb extending into ICA and ECA origins resulting in only mild stenosis. Normal waveforms and color Doppler signal throughout. LEFT VERTEBRAL ARTERY:  Normal flow direction and waveform. IMPRESSION: 1. Bilateral carotid bifurcation plaque resulting in less than 50% diameter ICA stenosis. 2. Antegrade bilateral vertebral arterial flow. Electronically Signed   By: Corlis Leak M.D.   On: 04/25/2023 10:16   CT CERVICAL SPINE WO CONTRAST  Result Date: 04/24/2023 CLINICAL DATA:  Trauma, fall EXAM: CT CERVICAL SPINE WITHOUT CONTRAST TECHNIQUE: Multidetector CT imaging of the cervical spine was performed without intravenous contrast. Multiplanar CT image reconstructions were also generated. RADIATION DOSE REDUCTION: This exam was performed according to the departmental dose-optimization program which includes automated exposure control, adjustment of the mA and/or kV according to patient size and/or use of iterative reconstruction technique. COMPARISON:  03/18/2018 FINDINGS: Alignment: Alignment of posterior margins of vertebral bodies appears normal. Skull base and vertebrae: No recent fracture is seen. Degenerative changes are noted with bony spurs at multiple levels, more prominent at  C5-C6 and C6-C7 levels. Soft tissues and spinal canal: There is extrinsic pressure over the ventral margin of thecal sac caused by posterior bony spurs at C5-C6 level extending more to the right. Disc levels: There is encroachment of neural foramina by bony spurs from C3-C7 levels. Upper chest: Scarring is seen in the apices of both lungs. Other: Scattered arterial calcifications are seen. IMPRESSION: No recent fracture is seen in cervical spine. Cervical spondylosis with encroachment of neural foramina from C3 to C7 levels, more severe at C5-C6 level. Electronically Signed   By: Ernie Avena M.D.   On: 04/24/2023 13:21   CT HEAD WO CONTRAST  Result Date: 04/24/2023 CLINICAL DATA:  Trauma, fall, syncope EXAM:  CT HEAD WITHOUT CONTRAST TECHNIQUE: Contiguous axial images were obtained from the base of the skull through the vertex without intravenous contrast. RADIATION DOSE REDUCTION: This exam was performed according to the departmental dose-optimization program which includes automated exposure control, adjustment of the mA and/or kV according to patient size and/or use of iterative reconstruction technique. COMPARISON:  04/20/2023 FINDINGS: Brain: No acute intracranial findings are seen. There are no signs of bleeding within the cranium. Calcifications are seen in basal ganglia. Cortical sulci are prominent. There is decreased density in periventricular and subcortical white matter. Vascular: Unremarkable. Skull: No fracture is seen in calvarium. Sinuses/Orbits: There are no air-fluid levels or significant mucosal thickening. Old blowout fracture is seen in the floor of the left orbit which has not changed in comparison with the earlier examination done on 03/18/2018. Other: None. IMPRESSION: No acute intracranial findings are seen in noncontrast CT brain. Atrophy. Small-vessel disease. Old blowout fracture is seen in the floor of left orbit. Electronically Signed   By: Ernie Avena M.D.   On: 04/24/2023 13:14   DG Chest 1 View  Result Date: 04/24/2023 CLINICAL DATA:  Syncope.  Fall EXAM: CHEST  1 VIEW COMPARISON:  04/21/2021 FINDINGS: Minimal linear opacity at the lung bases likely scar or atelectasis. No consolidation. Normal cardiopericardial silhouette. No edema, pneumothorax or effusion. Known comminuted right humeral neck fracture. Please correlate with the recent shoulder x-ray of 04/20/2023. IMPRESSION: Slight basilar atelectasis.  No pneumothorax or effusion. Known right proximal humeral fracture Electronically Signed   By: Karen Kays M.D.   On: 04/24/2023 12:51   DG Elbow Complete Right  Result Date: 04/20/2023 CLINICAL DATA:  Right elbow status post fall last night. Proximal right humeral head  fracture. EXAM: RIGHT ELBOW - COMPLETE 3+ VIEW COMPARISON:  None Available. FINDINGS: Possible mild elevation of the distal anterior humeral fat pad, possible small elbow joint effusion. Minimal medial elbow joint space narrowing and peripheral degenerative spurring. No acute fracture is seen. No dislocation. Mild-to-moderate atherosclerotic calcifications. IMPRESSION: 1. Possible small elbow joint effusion. No acute fracture is identified. If there is persistent clinical concern for a radiographically occult fracture, note is made CT would be more sensitive. 2. Minimal medial elbow joint osteoarthritis. Electronically Signed   By: Neita Garnet M.D.   On: 04/20/2023 12:51   DG Clavicle Left  Result Date: 04/20/2023 CLINICAL DATA:  Left chest bruising after fall last night. EXAM: LEFT CLAVICLE - 2+ VIEWS COMPARISON:  AP chest 04/21/2021 FINDINGS: There is vertical, obliqued linear fracture line lucency within the lateral shaft and adjacent head of the left clavicle. No significant displacement. The clavicular joint remains appropriately aligned. Mild glenohumeral joint space narrowing with moderate inferior glenoid degenerative osteophytosis. Mild atherosclerotic calcification within the aortic arch. Old healed fracture of the posterolateral left  seventh rib. IMPRESSION: Nondisplaced acute fracture of the lateral shaft and head of the left clavicle. Electronically Signed   By: Neita Garnet M.D.   On: 04/20/2023 11:30   DG Shoulder Right  Result Date: 04/20/2023 CLINICAL DATA:  Right shoulder pain. Right arm pain starting last night when fell. EXAM: RIGHT SHOULDER - 2+ VIEW COMPARISON:  AP chest 04/21/2021 FINDINGS: There is mildly decreased bone mineralization. There is an acute, moderately impacted, and highly comminuted fracture involving the lateral humeral head and the humeral surgical neck. There is high-grade comminution at the inferolateral aspect of the humeral head. Up to approximately 6 mm anterior  displacement of the distal fracture component with respect to the proximal fracture component. The humeral head remains appropriately located with respect to the glenoid fossa. Mild inferior glenoid degenerative osteophytosis. Moderate lateral downsloping of the acromion. IMPRESSION: Acute, moderately impacted, and highly comminuted fracture involving the lateral humeral head and the humeral surgical neck. Electronically Signed   By: Neita Garnet M.D.   On: 04/20/2023 11:28   CT Head Wo Contrast  Result Date: 04/20/2023 CLINICAL DATA:  ams, etoh use EXAM: CT HEAD WITHOUT CONTRAST TECHNIQUE: Contiguous axial images were obtained from the base of the skull through the vertex without intravenous contrast. RADIATION DOSE REDUCTION: This exam was performed according to the departmental dose-optimization program which includes automated exposure control, adjustment of the mA and/or kV according to patient size and/or use of iterative reconstruction technique. COMPARISON:  None Available. FINDINGS: Brain: No evidence of acute infarction, hemorrhage, hydrocephalus, extra-axial collection or mass lesion/mass effect. Sequela of moderate chronic microvascular ischemic change. Mineralization of the basal ganglia on the right. Vascular: No hyperdense vessel or unexpected calcification. Skull: Normal. Negative for fracture or focal lesion. Sinuses/Orbits: No middle ear or mastoid effusion. Paranasal sinuses are clear. Left lens replacement. Orbits are otherwise unremarkable. Other: None. IMPRESSION: No acute intracranial abnormality. Electronically Signed   By: Lorenza Cambridge M.D.   On: 04/20/2023 11:25  [2 weeks]  Assessment:   67 y.o. female with a history of etoh abuse, HTN, hyperlipidemia, hypothyroidism, presenting to the hospital after episode of syncope and found to have worsening anemia, heme positive stool.    Hgb on admission was 8.6, down from 10.1 several days prior. She does have a history of chronic  normocytic anemia. Hgb down to 7.6 but back up at 8.6. she has not required blood.     EGD this admission with LA Grade C reflux esophagitis s/p biopsy (reflux changes), benign-appearing esophageal stenosis, few nodules in stomach s/p biopsy (hyperplastic), and normal duodenum. Consider outpatient EGD to remove other small hyperplastic polyps per Dr. Levon Hedger. 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 Notably, last colonoscopy Dec 2022: internal hemorrhoids, one 10 mm polyp in descendign colon. (Tubular adenoma).    Plan to complete GI work up with colonoscopy but failed did not complete bowel prep. She is willing to prep today.     Plan:   Colonoscopy tomorrow if patient adequately preps.    LOS: 6 days   Leanna Battles. Dixon Boos [facility] [phone] 8/15/20243:06 PM

## 2023-04-30 NOTE — Progress Notes (Signed)
Patient encouraged to drink prep for bowel clearance throughout the night, patient only consumed (1) 8oz cup of prep. Patient requested tylenol for pain 7/10 I right arm PRN administered and noted effectiveness within the hour patient pain decreased to 2/10.

## 2023-04-30 NOTE — Progress Notes (Signed)
PROGRESS NOTE  Judy Davis, is a 67 y.o. 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, recurrent falls in the setting of alcohol abuse, HTN, hypothyroidism and HLD admitted on 04/24/2023 after another episode of passing out and found to have acute on chronic symptomatic anemia - 04/30/23 Poor colonoscopy prep overnight so colonoscopy has been postponed until 05/01/2023 after additional colon prep on 04/30/2023 -Awaiting colonoscopy and completion of GI workup prior to discharge   -Assessment and Plan: 1)Syncope- recurrent falls in the setting of alcohol abuse, -Ruled out for ACS by EKG and serial troponins -On telemetry monitored unit=-no significant  arrhythmias,  -- echocardiogram from 04/25/2023 w/o  significant aortic stenosis or other outflow obstruction, and without segmental/Regional wall motion abnormalities, EF is 60 to 65%,, grade 1 diastolic dysfunction noted  --carotid artery Dopplers without hemodynamically significant stenosis -CT C-spine without acute fractures, patient does not have cervical spondylosis with encroachment of the neural foramina from C3-C7 levels most severe at C5-C6- -CT of the head without acute findings patient does have atrophy and small vessel disease, old blowout fracture is seen in the floor of the left orbit --She was seen in the ED on 04/20/2023 after passing out and sustaining fractures of the right proximal humerus and left clavicle -Physical therapy eval appreciated recommends home health PT     2) acute on chronic symptomatic anemia=--- Hgb was 10.1 on 04/20/2023 -Hgb continues to drift down partly due to hemodilution suspect some degree of acute blood loss  anemia -Stool occult blood is positive in the ER -GI consult appreciated --Ferritin is 154, serum iron is low normal at 32 iron saturation is low at 10 TIBC 321 -Folate and B12 are not low -IV Protonix for now -EGD on 04/26/2023 showed---FINDINGS: - LA Grade C reflux esophagitis with no bleeding.  Biopsied. - Benign-appearing esophageal stenosis.  - 3 cm hiatal hernia.  - A few mucosal papules (nodules) found in the stomach.  Biopsied.  - Normal examined duodenum.  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   -04/30/23 Poor colonoscopy prep overnight so colonoscopy has been postponed until 05/01/2023 after additional colon prep on 04/30/2023 -Awaiting colonoscopy and completion of GI workup prior to discharge -GI team request colonoscopy and completion of GI workup prior to discharge   3)Etoh Abuse-- BAL less than 10 --she drinks two [employer] 12 oz cans daily, occasional liquor  -- High risk for DTs, lorazepam per CIWA protocol -Thiamine and folic acid as ordered   4) chronic hyponatremia/hypochloremia/hypokalemia--suspect alcohol abuse related -Sodium is chronically low at 124 which is not far from her baseline which usually ranges from 124 to 1 28, low at this 90 which is also close to baseline, -Gentle hydration with normal saline -Replaced potassium -Monitor closely   5) hypothyroidism--continue levothyroxine, check TSH   6) right proximal humerus fracture/left clavicular fracture--diagnosed on 04/20/2023 after patient fell -Continue to use sling on right upper extremity -Outpatient follow-up with orthopedic surgery advised   7)HTN--amlodipine and metoprolol as ordered  8)PAD---There is Severe (Grade IV)  atheroma plaque involving the aortic arch as per echocardiogram from 04/25/2023 -Hold aspirin due to acute GI bleed  concerns -Started on atorvastatin on 04/25/2023  Status is: Inpatient   Disposition: The patient is from: Home              Anticipated d/c is to: Home----GI team request colonoscopy and completion of GI workup prior to discharge              Anticipated d/c date is: 1 day              Patient currently is not medically stable to d/c. Barriers: Not Clinically Stable-   Code Status :  -  Code Status: Full Code   Family Communication:    NA (patient is alert, awake and coherent)   DVT Prophylaxis  :   - SCDs  SCDs Start: 04/24/23 1915 Place TED hose Start: 04/24/23 1915   Lab Results  Component Value Date   PLT 417 (H) 04/30/2023    Inpatient Medications  Scheduled Meds:  amLODipine  5 mg Oral Daily   atorvastatin  40 mg Oral q1800   folic acid  1 mg Oral Daily   levothyroxine  50 mcg Oral QAC breakfast   metoprolol tartrate  75 mg Oral BID   multivitamin with minerals  1 tablet Oral Daily   pantoprazole (PROTONIX) IV  40 mg Intravenous Q12H   sodium chloride flush  3 mL Intravenous Q12H   thiamine  100 mg Oral Daily   Or   thiamine  100 mg Intravenous Daily   Continuous Infusions:  sodium chloride Stopped (04/26/23 1800)   PRN Meds:.sodium chloride, acetaminophen **OR** acetaminophen, bisacodyl, LORazepam **OR** LORazepam, ondansetron (ZOFRAN) IV, polyethylene glycol, sodium chloride flush, traZODone   Anti-infectives (From admission, onward)    None       Subjective: Judy Davis today has no fevers, no emesis,  No chest pain,   - Tolerating liquid diet okay -04/30/23 Poor colonoscopy prep overnight so colonoscopy has been postponed until 05/01/2023 after additional colon prep on 04/30/2023 -Awaiting colonoscopy and completion of GI workup prior to discharge  Objective: Vitals:   04/29/23 1423 04/29/23 2132 04/30/23 0510 04/30/23 1236  BP: 122/70 117/64 122/73 134/70  Pulse: 69 70 65 64  Resp: 17 16 16 20   Temp: 98 F (36.7 C) 97.6 F (36.4 C) (!)  97.5 F (36.4 C) 98.1 F (36.7 C)  TempSrc: Oral   Oral  SpO2: 100% 100% 100% 100%  Weight:      Height:        Intake/Output Summary (Last 24 hours) at 04/30/2023 1741 Last data filed at 04/30/2023 0500 Gross per 24 hour  Intake 480 ml  Output --  Net 480 ml   Filed Weights   04/24/23 0938  Weight: 54.2 kg   Physical Exam Gen:- Awake Alert,  in no apparent distress  HEENT:- [place].AT, No sclera icterus Neck-Supple Neck,No JVD,.  Lungs-  CTAB , fair symmetrical air movement CV- S1, S2 normal, regular  Abd-  +ve B.Sounds, Abd Soft, No tenderness,    Extremity/Skin:- No  edema, pedal pulses present  Psych-affect is appropriate, oriented x3 Neuro-no new focal deficits, no tremors MSK-right upper extremity is in a sling , left clavicular tenderness  Data Reviewed: I have personally reviewed following labs and imaging studies  CBC: Recent Labs  Lab 04/24/23 1030 04/25/23 0508 04/26/23 0358 04/27/23 0809 04/28/23 0428 04/29/23 0427 04/30/23 0412  WBC 11.2*   < >  7.7 9.0 7.7 9.1 8.5  NEUTROABS 9.0*  --   --   --   --   --   --   HGB 8.6*   < > 8.0* 9.2* 7.6* 8.3* 8.6*  HCT 26.0*   < > 25.3* 28.9* 23.8* 26.1* 27.2*  MCV 91.9   < > 94.4 95.1 93.3 94.6 95.1  PLT 355   < > 364 474* 419* 409* 417*   < > = values in this interval not displayed.   Basic Metabolic Panel: Recent Labs  Lab 04/26/23 0358 04/26/23 0401 04/27/23 0809 04/28/23 0428 04/29/23 0427  NA 130* 130* 127* 127* 129*  K 3.3* 3.3* 4.4 3.6 3.5  CL 100 99 96* 97* 100  CO2 24 21* 23 23 22   GLUCOSE 101* 98 109* 113* 117*  BUN 7* 8 6* 9 7*  CREATININE 0.55 0.59 0.66 0.75 0.79  CALCIUM 8.4* 8.4* 9.3 8.7* 8.8*  MG 1.8  --   --   --   --   PHOS  --  2.7  --  3.2  --    GFR: Estimated Creatinine Clearance: 57.6 mL/min (by C-G formula based on SCr of 0.79 mg/dL). Liver Function Tests: Recent Labs  Lab 04/24/23 1030 04/26/23 0401 04/28/23 0428  AST 29  --   --   ALT 16  --   --   ALKPHOS 61  --   --    BILITOT 1.1  --   --   PROT 8.1  --   --   ALBUMIN 4.0 3.2* 3.2*   Scheduled Meds:  amLODipine  5 mg Oral Daily   atorvastatin  40 mg Oral q1800   folic acid  1 mg Oral Daily   levothyroxine  50 mcg Oral QAC breakfast   metoprolol tartrate  75 mg Oral BID   multivitamin with minerals  1 tablet Oral Daily   pantoprazole (PROTONIX) IV  40 mg Intravenous Q12H   sodium chloride flush  3 mL Intravenous Q12H   thiamine  100 mg Oral Daily   Or   thiamine  100 mg Intravenous Daily   Continuous Infusions:  sodium chloride Stopped (04/26/23 1800)    LOS: 6 days   Shon Hale M.D on 04/30/2023 at 5:41 PM  Go to [email or web address] - for contact info  [facility] - Office  [phone]  If 7PM-7AM, please contact night-coverage [email or web address] 04/30/2023, 5:41 PM

## 2023-04-30 NOTE — Evaluation (Signed)
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, recurrent falls in the setting of alcohol abuse, HTN, hypothyroidism and HLD presents to the ED after apparently passing out while talking to her brother---  -patient remembers standing and talking to her brother  She remembers is waking up on the floor but she does not know how she ended up on the floor   Clinical Impression  Patient functioning near baseline for functional mobility and gait other than presenting with sling to RUE, but demonstrates good return for getting into/out of bed, transferring to/from commode in bathroom and ambulating in room, hallway without loss of balance with occasional leaning on nearby objects for support with left hand.  Patient encouraged to ambulate daily as tolerated with nursing staff and family members.  Plan:  Patient discharged from physical therapy to care of nursing for ambulation daily as tolerated for length of stay.          If plan is discharge home, recommend the following: A little help with walking and/or transfers;A little help with bathing/dressing/bathroom;Assist for transportation   Can travel by private vehicle        Equipment Recommendations None recommended by PT  Recommendations for Other Services       Functional Status Assessment Patient has had a recent decline in their functional status and demonstrates the ability to make significant improvements in function in a reasonable and predictable amount of time.     Precautions / Restrictions Precautions Precautions: Fall Restrictions Weight Bearing Restrictions: No      Mobility  Bed Mobility Overal bed mobility: Modified Independent                  Transfers Overall transfer level: Modified  independent                      Ambulation/Gait Ambulation/Gait assistance: Supervision, Modified independent (Device/Increase time) Gait Distance (Feet): 75 Feet Assistive device: None Gait Pattern/deviations: Decreased step length - right, Decreased step length - left, Decreased stride length Gait velocity: decreased     General Gait Details: slow slightly labored cadence with occasional leaning on side rails in hallway, no loss of balance, limited mostly due to fatigue  Stairs            Wheelchair Mobility     Tilt Bed    Modified Rankin (Stroke Patients Only)       Balance Overall balance assessment: Needs assistance Sitting-balance support: Feet supported, No upper extremity supported Sitting balance-Leahy Scale: Good Sitting balance - Comments: seated in chair   Standing balance support: During functional activity, No upper extremity supported Standing balance-Leahy Scale: Fair Standing balance comment: without AD                             Pertinent Vitals/Pain Pain Assessment Pain Assessment: 0-10 Pain Score: 8  Pain Location: right shoulder Pain Descriptors / Indicators: Sore Pain Intervention(s): Limited activity within patient's tolerance, Monitored during session, Other (comment) (RUE in sling)    Home Living Family/patient expects to be discharged to:: Private residence Living Arrangements: Other relatives Available Help at Discharge: Family;Available 24 hours/day Type of Home: House Home Access: Stairs to enter Entrance Stairs-Rails: Right Entrance Stairs-Number of Steps:  2   Home Layout: One level Home Equipment: Agricultural consultant (2 wheels);BSC/3in1      Prior Function Prior Level of Function : Needs assist       Physical Assist : Mobility (physical);ADLs (physical) Mobility (physical): Bed mobility;Transfers;Gait;Stairs   Mobility Comments: household and short distanced community ambulator without AD, does not  drive ADLs Comments: Assisted by family     Extremity/Trunk Assessment   Upper Extremity Assessment Upper Extremity Assessment: Overall WFL for tasks assessed;RUE deficits/detail RUE Deficits / Details: in sling RUE: Unable to fully assess due to immobilization RUE Sensation: WNL RUE Coordination: WNL    Lower Extremity Assessment Lower Extremity Assessment: Overall WFL for tasks assessed    Cervical / Trunk Assessment Cervical / Trunk Assessment: Normal  Communication   Communication Communication: No apparent difficulties  Cognition Arousal: Alert Behavior During Therapy: WFL for tasks assessed/performed Overall Cognitive Status: Within Functional Limits for tasks assessed                                          General Comments      Exercises     Assessment/Plan    PT Assessment All further PT needs can be met in the next venue of care  PT Problem List Decreased strength;Decreased activity tolerance;Decreased balance;Decreased mobility       PT Treatment Interventions      PT Goals (Current goals can be found in the Care Plan section)  Acute Rehab PT Goals Patient Stated Goal: return home with family to assist PT Goal Formulation: With patient/family Time For Goal Achievement: 04/30/23 Potential to Achieve Goals: Good    Frequency       Co-evaluation               AM-PAC PT "6 Clicks" Mobility  Outcome Measure Help needed turning from your back to your side while in a flat bed without using bedrails?: None Help needed moving from lying on your back to sitting on the side of a flat bed without using bedrails?: None Help needed moving to and from a bed to a chair (including a wheelchair)?: None Help needed standing up from a chair using your arms (e.g., wheelchair or bedside chair)?: None Help needed to walk in hospital room?: A Little Help needed climbing 3-5 steps with a railing? : A Little 6 Click Score: 22    End of  Session   Activity Tolerance: Patient tolerated treatment well;Patient limited by fatigue Patient left: in chair;with call bell/phone within reach;with family/visitor present Nurse Communication: Mobility status PT Visit Diagnosis: Unsteadiness on feet (R26.81);Other abnormalities of gait and mobility (R26.89);Muscle weakness (generalized) (M62.81)    Time: 1610-9604 PT Time Calculation (min) (ACUTE ONLY): 23 min   Charges:   PT Evaluation $PT Eval Moderate Complexity: 1 Mod PT Treatments $Therapeutic Activity: 23-37 mins PT General Charges $$ ACUTE PT VISIT: 1 Visit         12:39 PM, 04/30/23 Ocie Bob, MPT Physical Therapist with [facility] [phone] office [phone] mobile phone

## 2023-04-30 NOTE — Plan of Care (Signed)

## 2023-04-30 NOTE — TOC Initial Note (Signed)
Transition of Care [facility]) - Initial/Assessment Note    Patient Details  Name: Judy Davis MRN: [id number] Date of Birth: [birth year]  [facility]) CM/SW Contact:    Villa Herb, LCSWA Phone Number: 04/30/2023, 11:41 AM  Clinical Narrative:                 TOC updated that PT is recommending HH PT for pt at D/C. CSW spoke with pt at bedside about recommendation. Pt is agreeable to referral and does not have agency preference. CSW to make referral to local agency and request MD place [facility] orders prior to D/C. TOC to follow.   Expected Discharge Plan: Home w Home Health Services Barriers to Discharge: Continued Medical Work up   Patient Goals and CMS Choice Patient states their goals for this hospitalization and ongoing recovery are:: return home CMS Medicare.gov Compare Post Acute Care list provided to:: Patient Choice offered to / list presented to : Patient      Expected Discharge Plan and Services In-house Referral: Clinical Social Work Discharge Planning Services: CM Consult Post Acute Care Choice: Home Health Living arrangements for the past 2 months: Single Family Home                                      Prior Living Arrangements/Services Living arrangements for the past 2 months: Single Family Home Lives with:: Spouse Patient language and need for interpreter reviewed:: Yes Do you feel safe going back to the place where you live?: Yes      Need for Family Participation in Patient Care: Yes (Comment) Care giver support system in place?: Yes (comment) Current home services: DME Criminal Activity/Legal Involvement Pertinent to Current Situation/Hospitalization: No - Comment as needed  Activities of Daily Living Home Assistive Devices/Equipment: None ADL Screening (condition at time of admission) Patient's cognitive ability adequate to safely complete daily activities?: Yes Is the patient deaf or have difficulty hearing?: No Does the patient  have difficulty seeing, even when wearing glasses/contacts?: No Does the patient have difficulty concentrating, remembering, or making decisions?: No Patient able to express need for assistance with ADLs?: Yes Does the patient have difficulty dressing or bathing?: No Independently performs ADLs?: Yes (appropriate for developmental age) Does the patient have difficulty walking or climbing stairs?: No Weakness of Legs: None Weakness of Arms/Hands: Right  Permission Sought/Granted                  Emotional Assessment Appearance:: Appears stated age Attitude/Demeanor/Rapport: Engaged Affect (typically observed): Accepting Orientation: : Oriented to Self, Oriented to Place, Oriented to  Time, Oriented to Situation Alcohol / Substance Use: Not Applicable Psych Involvement: No (comment)  Admission diagnosis:  Syncope and collapse [R55] Anemia, unspecified type [D64.9] Patient Active Problem List   Diagnosis Date Noted   Melena 04/26/2023   Syncope and collapse 04/24/2023   Rt Prox Humerus fracture 04/24/2023   Closed left clavicular fracture 04/24/2023   Closed left hip fracture (HCC) 04/21/2021   Hypothyroidism    Hyperlipidemia    Chronic pancreatitis (HCC) 12/10/2020   Encounter for screening colonoscopy 12/10/2020   Elevated CA 19-9 level 07/18/2020   Acute kidney injury (HCC)    Severe Vitamin D deficiency 02/28/2020   Malnutrition of moderate degree 02/28/2020   Generalized abdominal pain 02/27/2020   GERD (gastroesophageal reflux disease) 02/27/2020   Altered mental state 02/27/2020   Sinus tachycardia  02/27/2020   Acute on chronic pancreatitis (HCC) 02/27/2020   Pancreatic mass 02/27/2020   Pancreatic lesion 02/27/2020   Gait instability 08/10/2019   Alcohol abuse 08/08/2019   Intractable nausea and vomiting 08/08/2019   Intractable vomiting 08/07/2019   Hematoma 09/29/2016   Elevated AST (SGOT) 09/29/2016   Hyperglycemia 09/29/2016   Hypokalemia 09/29/2016    Upper respiratory infection 07/06/2011   Lower urinary tract infectious disease 07/06/2011   Nausea and vomiting 07/05/2011   Hyponatremia 07/05/2011   HTN (hypertension) 07/05/2011   Anemia 07/05/2011   PCP:  Tylene Fantasia., PA-C Pharmacy:   [facility] [phone] - [place], [place] - [address] [place] [address] Phone: [phone] Fax: [phone]     Social Determinants of Health (SDOH) Social History: SDOH Screenings   Food Insecurity: No Food Insecurity (04/24/2023)  Housing: Low Risk  (04/24/2023)  Transportation Needs: No Transportation Needs (04/24/2023)  Utilities: Not At Risk (04/24/2023)  Tobacco Use: Low Risk  (04/27/2023)   SDOH Interventions:     Readmission Risk Interventions     No data to display

## 2023-05-01 ENCOUNTER — Encounter (HOSPITAL_COMMUNITY)
Admission: EM | Disposition: A | Discharge status: Home Health | Payer: Self-pay | Source: Home / Self Care | Attending: Family Medicine

## 2023-05-01 ENCOUNTER — Encounter (HOSPITAL_COMMUNITY): Payer: Self-pay | Admitting: Anesthesiology

## 2023-05-01 ENCOUNTER — Telehealth: Payer: Self-pay | Admitting: Gastroenterology

## 2023-05-01 DIAGNOSIS — D649 Anemia, unspecified: Secondary | ICD-10-CM | POA: Diagnosis not present

## 2023-05-01 DIAGNOSIS — R55 Syncope and collapse: Secondary | ICD-10-CM | POA: Diagnosis not present

## 2023-05-01 LAB — CBC
HCT: 25.8 % — ABNORMAL LOW (ref 36.0–46.0)
Hemoglobin: 8.2 g/dL — ABNORMAL LOW (ref 12.0–15.0)
MCH: 29.7 pg (ref 26.0–34.0)
MCHC: 31.8 g/dL (ref 30.0–36.0)
MCV: 93.5 fL (ref 80.0–100.0)
Platelets: 407 10*3/uL — ABNORMAL HIGH (ref 150–400)
RBC: 2.76 MIL/uL — ABNORMAL LOW (ref 3.87–5.11)
RDW: 13.6 % (ref 11.5–15.5)
WBC: 7 10*3/uL (ref 4.0–10.5)
nRBC: 0 % (ref 0.0–0.2)

## 2023-05-01 LAB — RENAL FUNCTION PANEL
Albumin: 3.3 g/dL — ABNORMAL LOW (ref 3.5–5.0)
Anion gap: 8 (ref 5–15)
BUN: 7 mg/dL — ABNORMAL LOW (ref 8–23)
CO2: 23 mmol/L (ref 22–32)
Calcium: 8.9 mg/dL (ref 8.9–10.3)
Chloride: 99 mmol/L (ref 98–111)
Creatinine, Ser: 0.75 mg/dL (ref 0.44–1.00)
GFR, Estimated: >60 mL/min (ref >60)
Glucose, Bld: 101 mg/dL — ABNORMAL HIGH (ref 70–99)
Phosphorus: 4 mg/dL (ref 2.5–4.6)
Potassium: 3.6 mmol/L (ref 3.5–5.1)
Sodium: 130 mmol/L — ABNORMAL LOW (ref 135–145)

## 2023-05-01 LAB — GLUCOSE, CAPILLARY
Glucose-Capillary: 108 mg/dL — ABNORMAL HIGH (ref 70–99)
Glucose-Capillary: 111 mg/dL — ABNORMAL HIGH (ref 70–99)

## 2023-05-01 SURGERY — COLONOSCOPY WITH PROPOFOL
Anesthesia: Choice

## 2023-05-01 MED ORDER — TRAZODONE HCL 50 MG PO TABS: 50.0000 mg | ORAL_TABLET | Freq: Every day | ORAL | 2 refills | Status: AC

## 2023-05-01 MED ORDER — LEVOTHYROXINE SODIUM 50 MCG PO TABS: 50.0000 ug | ORAL_TABLET | Freq: Every day | ORAL | 2 refills | Status: AC

## 2023-05-01 MED ORDER — LISINOPRIL 5 MG PO TABS: 5.0000 mg | ORAL_TABLET | Freq: Every day | ORAL | 3 refills | Status: AC

## 2023-05-01 MED ORDER — METOPROLOL TARTRATE 75 MG PO TABS: 75.0000 mg | ORAL_TABLET | Freq: Two times a day (BID) | ORAL | 4 refills | Status: AC

## 2023-05-01 MED ORDER — PANTOPRAZOLE SODIUM 40 MG PO TBEC: 40.0000 mg | DELAYED_RELEASE_TABLET | Freq: Two times a day (BID) | ORAL | 5 refills | Status: AC

## 2023-05-01 NOTE — Telephone Encounter (Signed)
Judy Davis - Please make patient a hospital follow up in 2-3 weeks with [facility]. If no availability then can be with AB or [facility] who she seen during hospital stay.   Jacques Earthly - please set patient up for a CBC mid next week. Dx: anemia   Brooke Bonito, MSN, APRN, FNP-BC, AGACNP-BC [facility]

## 2023-05-01 NOTE — Discharge Summary (Signed)
Judy Davis, is a 67 y.o. female  DOB [birth year]  MRN [id number].  Admission date:  04/24/2023  Admitting Physician  Shon Hale, MD  Discharge Date:  05/01/2023   Primary MD  Tylene Fantasia., PA-C  Recommendations for primary care physician for things to follow:   1)Please Follow up with Gastroenterologist Dr. Derwood Kaplan 2 to 3 weeks to discuss possible EGD and colonoscopy test  -address [address], [address], [place] [address],,YQMV[facility] Number [phone]     2)Avoid ibuprofen/Advil/Aleve/Motrin/Goody Powders/Naproxen/BC powders/Meloxicam/Diclofenac/Indomethacin and other Nonsteroidal anti-inflammatory medications as these will make you more likely to bleed and can cause stomach ulcers, can also cause Kidney problems.   3) follow-up with orthopedic surgeon Dr. Fuller Canada as advised in the week  4) continue to use sling for your right arm  5)Complete abstinence from alcohol advised  6) repeat CBC and CMP blood test with primary care provider ( muse, Lorayne Marek D., PA-C) advised in 1 week  Admission Diagnosis  Syncope and collapse [R55] Anemia, unspecified type [D64.9]   Discharge Diagnosis  Syncope and collapse [R55] Anemia, unspecified type [D64.9]    Principal Problem:   Syncope and collapse Active Problems:   Rt Prox Humerus fracture   Closed left clavicular fracture   HTN (hypertension)   Hypothyroidism   Melena   Heme positive stool      Past Medical History:  Diagnosis Date   ETOH abuse    HTN (hypertension)    Hyperlipidemia    Hypothyroidism     Past Surgical History:  Procedure Laterality Date   ABDOMINAL HYSTERECTOMY  1970's   BIOPSY  04/26/2023   Procedure: BIOPSY;  Surgeon: Dolores Frame, MD;  [facility];  Service: Gastroenterology;;   COLONOSCOPY WITH PROPOFOL N/A 02/05/2021   Procedure: COLONOSCOPY WITH PROPOFOL;  Surgeon:  Lanelle Bal, DO;  [facility];  Service: Endoscopy;  Laterality: N/A;  AM   COLONOSCOPY WITH PROPOFOL N/A 08/19/2021   Procedure: COLONOSCOPY WITH PROPOFOL;  Surgeon: Lanelle Bal, DO;  [facility];  Service: Endoscopy;  Laterality: N/A;  9:00am   ESOPHAGOGASTRODUODENOSCOPY (EGD) WITH PROPOFOL N/A 08/09/2019   Fields: LA Grade C esophagitis. gastritis. no h.pylori   ESOPHAGOGASTRODUODENOSCOPY (EGD) WITH PROPOFOL N/A 04/26/2023   Procedure: ESOPHAGOGASTRODUODENOSCOPY (EGD) WITH PROPOFOL;  Surgeon: Dolores Frame, MD;  [facility];  Service: Gastroenterology;  Laterality: N/A;   GIVENS CAPSULE STUDY N/A 04/27/2023   Procedure: GIVENS CAPSULE STUDY;  Surgeon: Lanelle Bal, DO;  [facility];  Service: Endoscopy;  Laterality: N/A;   POLYPECTOMY  08/19/2021   Procedure: POLYPECTOMY;  Surgeon: Lanelle Bal, DO;  [facility];  Service: Endoscopy;;   TOTAL HIP ARTHROPLASTY Left 04/23/2021   Procedure: LEFT TOTAL HIP ARTHROPLASTY ANTERIOR APPROACH;  Surgeon: Cammy Copa, MD;  Location: [facility];  Service: Orthopedics;  Laterality: Left;       HPI  from the history and physical done on the day of admission:  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, recurrent falls in the setting of alcohol abuse, HTN, hypothyroidism and HLD presents to the ED after apparently passing out while talking to her brother--- -patient remembers standing and talking to her brother She remembers is waking up on the floor but she does not know how she ended up on the floor -Poor recall of events -She was apparently not postictal and no tongue biting, no incontinence -No headaches no chest pains palpitations dizziness shortness of breath or other prodromal symptoms before or after passing out -she drinks two Colt 45 12 oz cans daily, occasional liquor  -She was seen in  the ED on 04/20/2023 after passing out and sustaining fractures of the right proximal humerus and left clavicle -CT C-spine without acute fractures, patient does not have cervical spondylosis with encroachment of the neural foramina from C3-C7 levels most severe at C5-C6- -CT of the head without acute findings patient does have atrophy and small vessel disease, old blowout fracture is seen in the floor of the left orbit -Chest x-ray without acute findings -Hgb is down to 8.6 , Hgb was 10.1 on 04/20/2023 -Stool occult blood is positive in the ER -Sodium is chronically low at 124 which is not far from her baseline which usually ranges from 124 to 1 28, low at this 90 which is also close to baseline, potassium is 3.5 creatinine 0.71, LFTs are not elevated BAL less than 10 -Troponin is 3, repeat troponin is still 3, -EKG sinus without acute findings -Ferritin is 154, serum iron is low normal at 32 iron saturation is low at 10 TIBC 321 -Additional history obtained from patient's sister [facility] Course:   Brief Narrative:  67 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 Despite persuasion patient remains noncompliant with colonoscopy prep... Colonoscopy procedure has been postponed at least twice due to poor prep, patient remains noncompliant with drinking GoLytely -She wants to go home and follow-up as outpatient with GI for possible outpatient colonoscopy   -Assessment and Plan: 1)Syncope- recurrent falls in the setting of alcohol abuse, -Ruled out for ACS by EKG and serial troponins -On telemetry monitored unit=-no significant  arrhythmias,  -- echocardiogram from 04/25/2023 w/o  significant aortic stenosis or other outflow obstruction, and without  segmental/Regional wall motion abnormalities, EF is 60 to 65%,, grade 1 diastolic dysfunction noted  --carotid artery Dopplers without hemodynamically significant stenosis -CT C-spine without acute fractures, patient does not have cervical spondylosis with encroachment of the neural foramina from C3-C7 levels most severe at C5-C6- -CT of the head without acute findings patient does have atrophy and small vessel disease, old blowout fracture is seen in the floor of the left orbit --She was seen in the ED on 04/20/2023 after passing out and sustaining fractures of the right proximal humerus and left clavicle -Physical therapy eval appreciated recommends home health PT     2) acute on chronic symptomatic anemia=--- Hgb was 10.1 on 04/20/2023 -Hgb continues to drift down partly due to hemodilution suspect some degree of acute blood loss anemia -Stool occult blood is positive in the ER -GI consult appreciated --Ferritin is 154, serum iron is low normal  at 32 iron saturation is low at 10 TIBC 321 -Folate and B12 are not low -IV Protonix for now -EGD on 04/26/2023 showed---FINDINGS: - LA Grade C reflux esophagitis with no bleeding.  Biopsied. - Benign-appearing esophageal stenosis.  - 3 cm hiatal hernia.  - A few mucosal papules (nodules) found in the stomach.  Biopsied.  - Normal examined duodenum.  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   -NB!! Despite persuasion patient remains noncompliant with colonoscopy prep... Colonoscopy procedure has been postponed at least twice due to poor prep, patient remains noncompliant with drinking GoLytely -She wants to go home and follow-up as outpatient with GI for possible outpatient colonoscopy   3)Etoh Abuse-- BAL less than 10 --she drinks two Colt 45 12 oz cans daily, occasional liquor  -- High risk for DTs,  lorazepam per CIWA protocol -Thiamine and folic acid as ordered   4) chronic hyponatremia/hypochloremia/hypokalemia--suspect alcohol abuse related -Sodium is chronically low at 124 which is not far from her baseline which usually ranges from 124 to 1 28, low at this 90 which is also close to baseline, -Sodium stable around 130   5) hypothyroidism--continue levothyroxine, TSH is normal at 3.69   6) right proximal humerus fracture/left clavicular fracture--diagnosed on 04/20/2023 after patient fell -Continue to use sling on right upper extremity -Outpatient follow-up with orthopedic surgery advised   7)HTN--lisinopril and metoprolol    8)PAD---There is Severe (Grade IV) atheroma plaque involving the aortic arch as per echocardiogram from 04/25/2023 -Hold aspirin due to acute GI bleed concerns -Started on atorvastatin on 04/25/2023    Discharge Condition: Stable  Follow UP   Follow-up Information     [facility].   Specialty: Home Health Services Why: Agency will reach out to you to set up first home visit. Contact information: [address] [place] [address] [phone]         Vickki Hearing, MD. Schedule an appointment as soon as possible for a visit in 1 week(s).   Specialties: Orthopedic Surgery, Radiology Why: for Rt shoulder/right arm fracture Contact information: [address] [place] [address] [phone]         Marguerita Merles, Reuel Boom, MD. Schedule an appointment as soon as possible for a visit in 3 week(s).   Specialty: Gastroenterology Contact information: [address] [place] [address] [phone]         Kizzie Furnish D., PA-C. Schedule an appointment as soon as possible for a visit in 1 week(s).   Why: Repeat CBC and CMP blood test within a week Contact information: [address] [place] [address] [phone]                 Consults obtained  -GI  Diet and Activity recommendation:  As advised  Discharge Instructions    Discharge Instructions     Call MD for:  difficulty breathing, headache or visual disturbances   Complete by: As directed    Call MD for:  persistant dizziness or light-headedness   Complete by: As directed    Call MD for:  persistant nausea and vomiting   Complete by: As directed    Call MD for:  severe uncontrolled pain   Complete by: As directed    Call MD for:  temperature >100.4   Complete by: As directed  Diet - low sodium heart healthy   Complete by: As directed    Discharge instructions   Complete by: As directed    1)Please Follow up with Gastroenterologist Dr. Derwood Kaplan 2 to 3 weeks to discuss possible EGD and colonoscopy test  -address [address], [address], [place] [address],,[facility] Number [phone]     2)Avoid ibuprofen/Advil/Aleve/Motrin/Goody Powders/Naproxen/BC powders/Meloxicam/Diclofenac/Indomethacin and other Nonsteroidal anti-inflammatory medications as these will make you more likely to bleed and can cause stomach ulcers, can also cause Kidney problems.   3) follow-up with orthopedic surgeon Dr. Fuller Canada as advised in the week  4) continue to use sling for your right arm  5)Complete abstinence from alcohol advised  6) repeat CBC and CMP blood test with primary care provider ( muse, Rochelle D., PA-C) advised in 1 week   Increase activity slowly   Complete by: As directed         Discharge Medications     Allergies as of 05/01/2023   No Known Allergies      Medication List     STOP taking these medications    amLODipine 5 MG tablet Commonly known as: NORVASC       TAKE these medications    levothyroxine 50 MCG tablet Commonly known as: SYNTHROID Take 1 tablet (50 mcg total) by mouth daily before breakfast.   lisinopril 5 MG tablet Commonly known as: ZESTRIL Take 1 tablet (5 mg total) by mouth daily.   Metoprolol Tartrate 75 MG  Tabs Take 1 tablet (75 mg total) by mouth in the morning and at bedtime.   multivitamin with minerals Tabs tablet Take 1 tablet by mouth daily.   oxyCODONE 5 MG immediate release tablet Commonly known as: Roxicodone Take 0.5 tablets (2.5 mg total) by mouth every 4 (four) hours as needed for severe pain.   pantoprazole 40 MG tablet Commonly known as: Protonix Take 1 tablet (40 mg total) by mouth 2 (two) times daily.   traZODone 50 MG tablet Commonly known as: DESYREL Take 1 tablet (50 mg total) by mouth at bedtime.        Major procedures and Radiology Reports - PLEASE review detailed and final reports for all details, in brief -   ECHOCARDIOGRAM COMPLETE  Result Date: 04/25/2023    ECHOCARDIOGRAM REPORT   Patient Name:   Judy Davis Date of Exam: 04/25/2023 Medical Rec #:  [id number]         Height:       66.0 in Accession #:    [id number]        Weight:       119.6 lb Date of Birth:  1956/03/07          BSA:          1.607 m Patient Age:    67 years          BP:           150/77 mmHg Patient Gender: F                 HR:           65 bpm. [facility] Procedure: 2D Echo, Cardiac Doppler and Color Doppler Indications:    R94.31 Abnormal EKG  History:        Patient has prior history of Echocardiogram examinations, most                 recent 03/01/2020. Abnormal ECG, Arrythmias:Tachycardia,  Signs/Symptoms:Syncope and Altered Mental Status; Risk                 Factors:Hypertension and Dyslipidemia. ETOH.  Sonographer:    Sheralyn Boatman RDCS Referring Phys: [id number] Judy Davis IMPRESSIONS  1. Left ventricular ejection fraction, by estimation, is 60 to 65%. The left ventricle has normal function. The left ventricle has no regional wall motion abnormalities. Left ventricular diastolic parameters are consistent with Grade I diastolic dysfunction (impaired relaxation).  2. Right ventricular systolic function is normal. The right ventricular size is normal. Mildly  increased right ventricular wall thickness.  3. The mitral valve is degenerative. Trivial mitral valve regurgitation. No evidence of mitral stenosis.  4. The aortic valve is tricuspid. Aortic valve regurgitation is not visualized. No aortic stenosis is present.  5. 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 Comparison(s): No significant change from prior study. Prior images reviewed side by side. FINDINGS  Left Ventricle: Left ventricular ejection fraction, by estimation, is 60 to 65%. The left ventricle has normal function. The left ventricle has no regional wall motion abnormalities. The left ventricular internal cavity size was normal in size. There is  no left ventricular hypertrophy. Left ventricular diastolic parameters are consistent with Grade I diastolic dysfunction (impaired relaxation). Right Ventricle: The right ventricular size is normal. Mildly increased right ventricular wall thickness. Right ventricular systolic function is normal. Left Atrium: Left atrial size was normal in size. Right Atrium: Right atrial size was normal in size. Pericardium: There is no evidence of pericardial effusion. Mitral Valve: The mitral valve is degenerative in appearance. Trivial mitral valve regurgitation. No evidence of mitral valve stenosis. Tricuspid Valve: The tricuspid valve is normal in structure. Tricuspid valve regurgitation is trivial. No evidence of tricuspid stenosis. Aortic Valve: The aortic valve is tricuspid. Aortic valve regurgitation is not visualized. No aortic stenosis is present. Pulmonic Valve: The pulmonic valve was normal in structure. Pulmonic valve regurgitation is not visualized. No evidence of pulmonic stenosis. Aorta: The aortic root and ascending aorta are structurally normal, with no evidence of dilitation. There is severe (Grade IV) atheroma plaque involving the aortic arch. IAS/Shunts: The interatrial septum is aneurysmal. No atrial level shunt detected by color  flow Doppler.  LEFT VENTRICLE PLAX 2D LVIDd:         4.10 cm     Diastology LVIDs:         2.80 cm     LV e' medial:    5.87 cm/s LV PW:         1.00 cm     LV E/e' medial:  10.3 LV IVS:        1.00 cm     LV e' lateral:   6.53 cm/s LVOT diam:     2.00 cm     LV E/e' lateral: 9.2 LV SV:         57 LV SV Index:   36 LVOT Area:     3.14 cm  LV Volumes (MOD) LV vol d, MOD A2C: 46.4 ml LV vol d, MOD A4C: 55.9 ml LV vol s, MOD A2C: 14.5 ml LV vol s, MOD A4C: 20.6 ml LV SV MOD A2C:     32.0 ml LV SV MOD A4C:     55.9 ml LV SV MOD BP:      33.7 ml RIGHT VENTRICLE            IVC RV S prime:     6.64 cm/s  IVC  diam: 1.80 cm TAPSE (M-mode): 2.2 cm LEFT ATRIUM             Index        RIGHT ATRIUM           Index LA diam:        2.90 cm 1.80 cm/m   RA Area:     14.40 cm LA Vol (A2C):   21.6 ml 13.44 ml/m  RA Volume:   34.20 ml  21.28 ml/m LA Vol (A4C):   13.1 ml 8.15 ml/m LA Biplane Vol: 18.2 ml 11.32 ml/m  AORTIC VALVE LVOT Vmax:   80.40 cm/s LVOT Vmean:  52.000 cm/s LVOT VTI:    0.182 m  AORTA Ao Root diam: 3.10 cm Ao Asc diam:  3.40 cm MITRAL VALVE               TRICUSPID VALVE MV Area (PHT): 3.03 cm    TR Peak grad:   20.2 mmHg MV Decel Time: 250 msec    TR Vmax:        225.00 cm/s MV E velocity: 60.20 cm/s MV A velocity: 77.70 cm/s  SHUNTS MV E/A ratio:  0.77        Systemic VTI:  0.18 m                            Systemic Diam: 2.00 cm Riley Lam MD Electronically signed by Riley Lam MD Signature Date/Time: 04/25/2023/1:03:42 PM    Final    US Carotid Bilateral  Result Date: 04/25/2023 CLINICAL DATA:  Syncope, hypertension, history of tobacco abuse, hyperlipidemia EXAM: BILATERAL CAROTID DUPLEX ULTRASOUND TECHNIQUE: Wallace Cullens scale imaging, color Doppler and duplex ultrasound were performed of bilateral carotid and vertebral arteries in the neck. COMPARISON:  None Available. FINDINGS: Criteria: Quantification of carotid stenosis is based on velocity parameters that correlate the residual  internal carotid diameter with NASCET-based stenosis levels, using the diameter of the distal internal carotid lumen as the denominator for stenosis measurement. The following velocity measurements were obtained: RIGHT ICA: 77/22 cm/sec CCA: 76/17 cm/sec SYSTOLIC ICA/CCA RATIO:  84 ECA: 1.0 cm/sec LEFT ICA: 97/26 cm/sec CCA: 83/12 cm/sec SYSTOLIC ICA/CCA RATIO:  1.2 ECA: 77 cm/sec RIGHT CAROTID ARTERY: Mild intimal thickening. Eccentric partially calcified plaque at the bifurcation resulting in only mild stenosis. Normal waveforms and color Doppler signal throughout. RIGHT VERTEBRAL ARTERY:  Normal flow direction and waveform. LEFT CAROTID ARTERY: Mild plaque in the bulb extending into ICA and ECA origins resulting in only mild stenosis. Normal waveforms and color Doppler signal throughout. LEFT VERTEBRAL ARTERY:  Normal flow direction and waveform. IMPRESSION: 1. Bilateral carotid bifurcation plaque resulting in less than 50% diameter ICA stenosis. 2. Antegrade bilateral vertebral arterial flow. Electronically Signed   By: Corlis Leak M.D.   On: 04/25/2023 10:16   CT CERVICAL SPINE WO CONTRAST  Result Date: 04/24/2023 CLINICAL DATA:  Trauma, fall EXAM: CT CERVICAL SPINE WITHOUT CONTRAST TECHNIQUE: Multidetector CT imaging of the cervical spine was performed without intravenous contrast. Multiplanar CT image reconstructions were also generated. RADIATION DOSE REDUCTION: This exam was performed according to the departmental dose-optimization program which includes automated exposure control, adjustment of the mA and/or kV according to patient size and/or use of iterative reconstruction technique. COMPARISON:  03/18/2018 FINDINGS: Alignment: Alignment of posterior margins of vertebral bodies appears normal. Skull base and vertebrae: No recent fracture is seen. Degenerative changes are noted with bony spurs at multiple levels, more prominent  at C5-C6 and C6-C7 levels. Soft tissues and spinal canal: There is extrinsic  pressure over the ventral margin of thecal sac caused by posterior bony spurs at C5-C6 level extending more to the right. Disc levels: There is encroachment of neural foramina by bony spurs from C3-C7 levels. Upper chest: Scarring is seen in the apices of both lungs. Other: Scattered arterial calcifications are seen. IMPRESSION: No recent fracture is seen in cervical spine. Cervical spondylosis with encroachment of neural foramina from C3 to C7 levels, more severe at C5-C6 level. Electronically Signed   By: Ernie Avena M.D.   On: 04/24/2023 13:21   CT HEAD WO CONTRAST  Result Date: 04/24/2023 CLINICAL DATA:  Trauma, fall, syncope EXAM: CT HEAD WITHOUT CONTRAST TECHNIQUE: Contiguous axial images were obtained from the base of the skull through the vertex without intravenous contrast. RADIATION DOSE REDUCTION: This exam was performed according to the departmental dose-optimization program which includes automated exposure control, adjustment of the mA and/or kV according to patient size and/or use of iterative reconstruction technique. COMPARISON:  04/20/2023 FINDINGS: Brain: No acute intracranial findings are seen. There are no signs of bleeding within the cranium. Calcifications are seen in basal ganglia. Cortical sulci are prominent. There is decreased density in periventricular and subcortical white matter. Vascular: Unremarkable. Skull: No fracture is seen in calvarium. Sinuses/Orbits: There are no air-fluid levels or significant mucosal thickening. Old blowout fracture is seen in the floor of the left orbit which has not changed in comparison with the earlier examination done on 03/18/2018. Other: None. IMPRESSION: No acute intracranial findings are seen in noncontrast CT brain. Atrophy. Small-vessel disease. Old blowout fracture is seen in the floor of left orbit. Electronically Signed   By: Ernie Avena M.D.   On: 04/24/2023 13:14   DG Chest 1 View  Result Date: 04/24/2023 CLINICAL DATA:   Syncope.  Fall EXAM: CHEST  1 VIEW COMPARISON:  04/21/2021 FINDINGS: Minimal linear opacity at the lung bases likely scar or atelectasis. No consolidation. Normal cardiopericardial silhouette. No edema, pneumothorax or effusion. Known comminuted right humeral neck fracture. Please correlate with the recent shoulder x-ray of 04/20/2023. IMPRESSION: Slight basilar atelectasis.  No pneumothorax or effusion. Known right proximal humeral fracture Electronically Signed   By: Karen Kays M.D.   On: 04/24/2023 12:51   DG Elbow Complete Right  Result Date: 04/20/2023 CLINICAL DATA:  Right elbow status post fall last night. Proximal right humeral head fracture. EXAM: RIGHT ELBOW - COMPLETE 3+ VIEW COMPARISON:  None Available. FINDINGS: Possible mild elevation of the distal anterior humeral fat pad, possible small elbow joint effusion. Minimal medial elbow joint space narrowing and peripheral degenerative spurring. No acute fracture is seen. No dislocation. Mild-to-moderate atherosclerotic calcifications. IMPRESSION: 1. Possible small elbow joint effusion. No acute fracture is identified. If there is persistent clinical concern for a radiographically occult fracture, note is made CT would be more sensitive. 2. Minimal medial elbow joint osteoarthritis. Electronically Signed   By: Neita Garnet M.D.   On: 04/20/2023 12:51   DG Clavicle Left  Result Date: 04/20/2023 CLINICAL DATA:  Left chest bruising after fall last night. EXAM: LEFT CLAVICLE - 2+ VIEWS COMPARISON:  AP chest 04/21/2021 FINDINGS: There is vertical, obliqued linear fracture line lucency within the lateral shaft and adjacent head of the left clavicle. No significant displacement. The clavicular joint remains appropriately aligned. Mild glenohumeral joint space narrowing with moderate inferior glenoid degenerative osteophytosis. Mild atherosclerotic calcification within the aortic arch. Old healed fracture of the posterolateral  left seventh rib. IMPRESSION:  Nondisplaced acute fracture of the lateral shaft and head of the left clavicle. Electronically Signed   By: Neita Garnet M.D.   On: 04/20/2023 11:30   DG Shoulder Right  Result Date: 04/20/2023 CLINICAL DATA:  Right shoulder pain. Right arm pain starting last night when fell. EXAM: RIGHT SHOULDER - 2+ VIEW COMPARISON:  AP chest 04/21/2021 FINDINGS: There is mildly decreased bone mineralization. There is an acute, moderately impacted, and highly comminuted fracture involving the lateral humeral head and the humeral surgical neck. There is high-grade comminution at the inferolateral aspect of the humeral head. Up to approximately 6 mm anterior displacement of the distal fracture component with respect to the proximal fracture component. The humeral head remains appropriately located with respect to the glenoid fossa. Mild inferior glenoid degenerative osteophytosis. Moderate lateral downsloping of the acromion. IMPRESSION: Acute, moderately impacted, and highly comminuted fracture involving the lateral humeral head and the humeral surgical neck. Electronically Signed   By: Neita Garnet M.D.   On: 04/20/2023 11:28   CT Head Wo Contrast  Result Date: 04/20/2023 CLINICAL DATA:  ams, etoh use EXAM: CT HEAD WITHOUT CONTRAST TECHNIQUE: Contiguous axial images were obtained from the base of the skull through the vertex without intravenous contrast. RADIATION DOSE REDUCTION: This exam was performed according to the departmental dose-optimization program which includes automated exposure control, adjustment of the mA and/or kV according to patient size and/or use of iterative reconstruction technique. COMPARISON:  None Available. FINDINGS: Brain: No evidence of acute infarction, hemorrhage, hydrocephalus, extra-axial collection or mass lesion/mass effect. Sequela of moderate chronic microvascular ischemic change. Mineralization of the basal ganglia on the right. Vascular: No hyperdense vessel or unexpected  calcification. Skull: Normal. Negative for fracture or focal lesion. Sinuses/Orbits: No middle ear or mastoid effusion. Paranasal sinuses are clear. Left lens replacement. Orbits are otherwise unremarkable. Other: None. IMPRESSION: No acute intracranial abnormality. Electronically Signed   By: Lorenza Cambridge M.D.   On: 04/20/2023 11:25    Today   Subjective    Judy Davis today has no new complaints -Continues to refuse colonoscopy -Eating and drinking well -No emesis   Patient has been seen and examined prior to discharge   Objective   Blood pressure 108/64, pulse 64, temperature 98.2 F (36.8 C), temperature source Oral, resp. rate 18, height 5\' 6"  (1.676 m), weight 54.2 kg, SpO2 100%.   Intake/Output Summary (Last 24 hours) at 05/01/2023 1258 Last data filed at 05/01/2023 1610 Gross per 24 hour  Intake 650 ml  Output 250 ml  Net 400 ml    Exam Gen:- Awake Alert, no acute distress  HEENT:- [place].AT, No sclera icterus Neck-Supple Neck,No JVD,.  Lungs-  CTAB , good air movement bilaterally CV- S1, S2 normal, regular Abd-  +ve B.Sounds, Abd Soft, No tenderness,    Extremity/Skin:- No  edema,   good pulses Psych-affect is appropriate, oriented x3 Neuro-no new focal deficits, no tremors  MSK-right upper extremity is in a sling , left clavicular tenderness    Data Review   CBC w Diff:  Lab Results  Component Value Date   WBC 7.0 05/01/2023   HGB 8.2 (L) 05/01/2023   HCT 25.8 (L) 05/01/2023   PLT 407 (H) 05/01/2023   LYMPHOPCT 10 04/24/2023   MONOPCT 8 04/24/2023   EOSPCT 0 04/24/2023   BASOPCT 0 04/24/2023    CMP:  Lab Results  Component Value Date   NA 130 (L) 05/01/2023   K 3.6 05/01/2023  CL 99 05/01/2023   CO2 23 05/01/2023   BUN 7 (L) 05/01/2023   CREATININE 0.75 05/01/2023   CREATININE 0.81 07/18/2020   PROT 8.1 04/24/2023   ALBUMIN 3.3 (L) 05/01/2023   BILITOT 1.1 04/24/2023   ALKPHOS 61 04/24/2023   AST 29 04/24/2023   ALT 16 04/24/2023   .  Total Discharge time is about 33 minutes  Shon Hale M.D on 05/01/2023 at 12:58 PM  Go to [email or web address] -  for contact info  [facility] - Office  [phone]

## 2023-05-01 NOTE — Consult Note (Signed)
[facility] Customer service manager [facility]) Accountable Care Organization (ACO) [facility] Liaison Note  05/01/2023  Judy Davis 1955/12/17 [id number]  Location: [facility] RN Hospital Liaison screened the patient remotely at [facility].  Insurance: [employer] HMO   Judy Davis is a 67 y.o. female who is a Primary Care Patient of Judy Davis., PA-C. The patient was screened for readmission hospitalization with noted low risk score for unplanned readmission risk with 1 IP/1 ED in 6 months.  The patient was assessed for potential [facility]) Care Management service needs for post hospital transition for care coordination. Review of patient's electronic medical record reveals patient was admitted with Syncope and collapse. In review of roster pt followed by [employer] program for acre management services.    [facility] Care Management/Population Health does not replace or interfere with any arrangements made by the Inpatient Transition of Care team.   For questions contact:   Judy Cousin, RN, [facility] Liaison [place]   Population Health Office Hours MTWF  8:00 am-6:00 pm Off on Thursday [phone] mobile [phone] [Office toll free line] Office Hours are M-F 8:30 - 5 pm [email or web address]

## 2023-05-01 NOTE — Discharge Instructions (Signed)
1)Please Follow up with Gastroenterologist Dr. Derwood Kaplan 2 to 3 weeks to discuss possible EGD and colonoscopy test  -address [address], [address], [place] [address],,QIONG Number [phone]     2)Avoid ibuprofen/Advil/Aleve/Motrin/Goody Powders/Naproxen/BC powders/Meloxicam/Diclofenac/Indomethacin and other Nonsteroidal anti-inflammatory medications as these will make you more likely to bleed and can cause stomach ulcers, can also cause Kidney problems.   3) follow-up with orthopedic surgeon Dr. Fuller Canada as advised in the week  4) continue to use sling for your right arm  5)Complete abstinence from alcohol advised  6) repeat CBC and CMP blood test with primary care provider ( muse, Grayson D., PA-C) advised in 1 week

## 2023-05-01 NOTE — Progress Notes (Signed)
Gastroenterology Progress Note   Referring Provider: No ref. provider found Primary Care Physician:  Judy Davis., PA-C Primary Gastroenterologist:  Dr. Marletta Lor  Patient ID: Judy Davis; 20-Nov-1955    Subjective   Denies seeing any blood in her stool. She had 3 BM overnight and small one this morning without evidence of blood while I was present. She denies any N/V, dizziness, lightheadedness, abdominal pain, chest pain, or shortness of breath. She would like to eat if possible.    Objective   Vital signs in last 24 hours Temp:  [98.1 F (36.7 C)-98.2 F (36.8 C)] 98.2 F (36.8 C) (08/16 0438) Pulse Rate:  [64-69] 64 (08/16 0438) Resp:  [18-20] 18 (08/16 0438) BP: (108-134)/(63-70) 108/64 (08/16 0438) SpO2:  [100 %] 100 % (08/16 0438) Last BM Date : 04/30/23  Physical Exam General:   Alert and oriented, pleasant Head:  Normocephalic and atraumatic. Eyes:  No icterus, sclera clear. Conjuctiva pink. Neck:  Supple, without thyromegaly or masses.  Abdomen:  Bowel sounds present, soft, non-tender, non-distended. No HSM or hernias noted. No rebound or guarding. No masses appreciated  Msk:  Symmetrical without gross deformities. Normal posture. Extremities:  Without clubbing. Edema to RUE and in sling.  Neurologic:  Alert and  oriented x4;  grossly normal neurologically. Skin:  Warm and dry, intact without significant lesions.  Psych:  Alert and cooperative. Normal mood and affect.  Intake/Output from previous day: 08/15 0701 - 08/16 0700 In: 650 [P.O.:650] Out: -  Intake/Output this shift: Total I/O In: -  Out: 250 [Urine:250]  Lab Results  Recent Labs    04/29/23 0427 04/30/23 0412 05/01/23 0421  WBC 9.1 8.5 7.0  HGB 8.3* 8.6* 8.2*  HCT 26.1* 27.2* 25.8*  PLT 409* 417* 407*   BMET Recent Labs    04/29/23 0427 05/01/23 0421  NA 129* 130*  K 3.5 3.6  CL 100 99  CO2 22 23  GLUCOSE 117* 101*  BUN 7* 7*  CREATININE 0.79 0.75   CALCIUM 8.8* 8.9   LFT Recent Labs    05/01/23 0421  ALBUMIN 3.3*   PT/INR No results for input(s): "LABPROT", "INR" in the last 72 hours. Hepatitis Panel No results for input(s): "HEPBSAG", "HCVAB", "HEPAIGM", "HEPBIGM" in the last 72 hours.  Studies/Results ECHOCARDIOGRAM COMPLETE  Result Date: 04/25/2023    ECHOCARDIOGRAM REPORT   Patient Name:   Judy Davis Date of Exam: 04/25/2023 Medical Rec #:  [id number]         Height:       66.0 in Accession #:    [id number]        Weight:       119.6 lb Date of Birth:  02/09/1956          BSA:          1.607 m Patient Age:    67 years          BP:           150/77 mmHg Patient Gender: F                 HR:           65 bpm. [facility] Procedure: 2D Echo, Cardiac Doppler and Color Doppler Indications:    R94.31 Abnormal EKG  History:        Patient has prior history of Echocardiogram examinations, most  recent 03/01/2020. Abnormal ECG, Arrythmias:Tachycardia,                 Signs/Symptoms:Syncope and Altered Mental Status; Risk                 Factors:Hypertension and Dyslipidemia. ETOH.  Sonographer:    Sheralyn Boatman RDCS Referring Phys: Judy Davis IMPRESSIONS  1. Left ventricular ejection fraction, by estimation, is 60 to 65%. The left ventricle has normal function. The left ventricle has no regional wall motion abnormalities. Left ventricular diastolic parameters are consistent with Grade I diastolic dysfunction (impaired relaxation).  2. Right ventricular systolic function is normal. The right ventricular size is normal. Mildly increased right ventricular wall thickness.  3. The mitral valve is degenerative. Trivial mitral valve regurgitation. No evidence of mitral stenosis.  4. The aortic valve is tricuspid. Aortic valve regurgitation is not visualized. No aortic stenosis is present.  5. 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 Comparison(s): No significant change from prior  study. Prior images reviewed side by side. FINDINGS  Left Ventricle: Left ventricular ejection fraction, by estimation, is 60 to 65%. The left ventricle has normal function. The left ventricle has no regional wall motion abnormalities. The left ventricular internal cavity size was normal in size. There is  no left ventricular hypertrophy. Left ventricular diastolic parameters are consistent with Grade I diastolic dysfunction (impaired relaxation). Right Ventricle: The right ventricular size is normal. Mildly increased right ventricular wall thickness. Right ventricular systolic function is normal. Left Atrium: Left atrial size was normal in size. Right Atrium: Right atrial size was normal in size. Pericardium: There is no evidence of pericardial effusion. Mitral Valve: The mitral valve is degenerative in appearance. Trivial mitral valve regurgitation. No evidence of mitral valve stenosis. Tricuspid Valve: The tricuspid valve is normal in structure. Tricuspid valve regurgitation is trivial. No evidence of tricuspid stenosis. Aortic Valve: The aortic valve is tricuspid. Aortic valve regurgitation is not visualized. No aortic stenosis is present. Pulmonic Valve: The pulmonic valve was normal in structure. Pulmonic valve regurgitation is not visualized. No evidence of pulmonic stenosis. Aorta: The aortic root and ascending aorta are structurally normal, with no evidence of dilitation. There is severe (Grade IV) atheroma plaque involving the aortic arch. IAS/Shunts: The interatrial septum is aneurysmal. No atrial level shunt detected by color flow Doppler.  LEFT VENTRICLE PLAX 2D LVIDd:         4.10 cm     Diastology LVIDs:         2.80 cm     LV e' medial:    5.87 cm/s LV PW:         1.00 cm     LV E/e' medial:  10.3 LV IVS:        1.00 cm     LV e' lateral:   6.53 cm/s LVOT diam:     2.00 cm     LV E/e' lateral: 9.2 LV SV:         57 LV SV Index:   36 LVOT Area:     3.14 cm  LV Volumes (MOD) LV vol d, MOD A2C: 46.4 ml  LV vol d, MOD A4C: 55.9 ml LV vol s, MOD A2C: 14.5 ml LV vol s, MOD A4C: 20.6 ml LV SV MOD A2C:     32.0 ml LV SV MOD A4C:     55.9 ml LV SV MOD BP:      33.7 ml RIGHT VENTRICLE  IVC RV S prime:     6.64 cm/s  IVC diam: 1.80 cm TAPSE (M-mode): 2.2 cm LEFT ATRIUM             Index        RIGHT ATRIUM           Index LA diam:        2.90 cm 1.80 cm/m   RA Area:     14.40 cm LA Vol (A2C):   21.6 ml 13.44 ml/m  RA Volume:   34.20 ml  21.28 ml/m LA Vol (A4C):   13.1 ml 8.15 ml/m LA Biplane Vol: 18.2 ml 11.32 ml/m  AORTIC VALVE LVOT Vmax:   80.40 cm/s LVOT Vmean:  52.000 cm/s LVOT VTI:    0.182 m  AORTA Ao Root diam: 3.10 cm Ao Asc diam:  3.40 cm MITRAL VALVE               TRICUSPID VALVE MV Area (PHT): 3.03 cm    TR Peak grad:   20.2 mmHg MV Decel Time: 250 msec    TR Vmax:        225.00 cm/s MV E velocity: 60.20 cm/s MV A velocity: 77.70 cm/s  SHUNTS MV E/A ratio:  0.77        Systemic VTI:  0.18 m                            Systemic Diam: 2.00 cm Riley Lam MD Electronically signed by Riley Lam MD Signature Date/Time: 04/25/2023/1:03:42 PM    Final    US Carotid Bilateral  Result Date: 04/25/2023 CLINICAL DATA:  Syncope, hypertension, history of tobacco abuse, hyperlipidemia EXAM: BILATERAL CAROTID DUPLEX ULTRASOUND TECHNIQUE: Wallace Cullens scale imaging, color Doppler and duplex ultrasound were performed of bilateral carotid and vertebral arteries in the neck. COMPARISON:  None Available. FINDINGS: Criteria: Quantification of carotid stenosis is based on velocity parameters that correlate the residual internal carotid diameter with NASCET-based stenosis levels, using the diameter of the distal internal carotid lumen as the denominator for stenosis measurement. The following velocity measurements were obtained: RIGHT ICA: 77/22 cm/sec CCA: 76/17 cm/sec SYSTOLIC ICA/CCA RATIO:  84 ECA: 1.0 cm/sec LEFT ICA: 97/26 cm/sec CCA: 83/12 cm/sec SYSTOLIC ICA/CCA RATIO:  1.2 ECA: 77 cm/sec RIGHT  CAROTID ARTERY: Mild intimal thickening. Eccentric partially calcified plaque at the bifurcation resulting in only mild stenosis. Normal waveforms and color Doppler signal throughout. RIGHT VERTEBRAL ARTERY:  Normal flow direction and waveform. LEFT CAROTID ARTERY: Mild plaque in the bulb extending into ICA and ECA origins resulting in only mild stenosis. Normal waveforms and color Doppler signal throughout. LEFT VERTEBRAL ARTERY:  Normal flow direction and waveform. IMPRESSION: 1. Bilateral carotid bifurcation plaque resulting in less than 50% diameter ICA stenosis. 2. Antegrade bilateral vertebral arterial flow. Electronically Signed   By: Corlis Leak M.D.   On: 04/25/2023 10:16   CT CERVICAL SPINE WO CONTRAST  Result Date: 04/24/2023 CLINICAL DATA:  Trauma, fall EXAM: CT CERVICAL SPINE WITHOUT CONTRAST TECHNIQUE: Multidetector CT imaging of the cervical spine was performed without intravenous contrast. Multiplanar CT image reconstructions were also generated. RADIATION DOSE REDUCTION: This exam was performed according to the departmental dose-optimization program which includes automated exposure control, adjustment of the mA and/or kV according to patient size and/or use of iterative reconstruction technique. COMPARISON:  03/18/2018 FINDINGS: Alignment: Alignment of posterior margins of vertebral bodies appears normal. Skull base and vertebrae: No recent fracture is seen.  Degenerative changes are noted with bony spurs at multiple levels, more prominent at C5-C6 and C6-C7 levels. Soft tissues and spinal canal: There is extrinsic pressure over the ventral margin of thecal sac caused by posterior bony spurs at C5-C6 level extending more to the right. Disc levels: There is encroachment of neural foramina by bony spurs from C3-C7 levels. Upper chest: Scarring is seen in the apices of both lungs. Other: Scattered arterial calcifications are seen. IMPRESSION: No recent fracture is seen in cervical spine. Cervical  spondylosis with encroachment of neural foramina from C3 to C7 levels, more severe at C5-C6 level. Electronically Signed   By: Ernie Avena M.D.   On: 04/24/2023 13:21   CT HEAD WO CONTRAST  Result Date: 04/24/2023 CLINICAL DATA:  Trauma, fall, syncope EXAM: CT HEAD WITHOUT CONTRAST TECHNIQUE: Contiguous axial images were obtained from the base of the skull through the vertex without intravenous contrast. RADIATION DOSE REDUCTION: This exam was performed according to the departmental dose-optimization program which includes automated exposure control, adjustment of the mA and/or kV according to patient size and/or use of iterative reconstruction technique. COMPARISON:  04/20/2023 FINDINGS: Brain: No acute intracranial findings are seen. There are no signs of bleeding within the cranium. Calcifications are seen in basal ganglia. Cortical sulci are prominent. There is decreased density in periventricular and subcortical white matter. Vascular: Unremarkable. Skull: No fracture is seen in calvarium. Sinuses/Orbits: There are no air-fluid levels or significant mucosal thickening. Old blowout fracture is seen in the floor of the left orbit which has not changed in comparison with the earlier examination done on 03/18/2018. Other: None. IMPRESSION: No acute intracranial findings are seen in noncontrast CT brain. Atrophy. Small-vessel disease. Old blowout fracture is seen in the floor of left orbit. Electronically Signed   By: Ernie Avena M.D.   On: 04/24/2023 13:14   DG Chest 1 View  Result Date: 04/24/2023 CLINICAL DATA:  Syncope.  Fall EXAM: CHEST  1 VIEW COMPARISON:  04/21/2021 FINDINGS: Minimal linear opacity at the lung bases likely scar or atelectasis. No consolidation. Normal cardiopericardial silhouette. No edema, pneumothorax or effusion. Known comminuted right humeral neck fracture. Please correlate with the recent shoulder x-ray of 04/20/2023. IMPRESSION: Slight basilar atelectasis.  No  pneumothorax or effusion. Known right proximal humeral fracture Electronically Signed   By: Karen Kays M.D.   On: 04/24/2023 12:51   DG Elbow Complete Right  Result Date: 04/20/2023 CLINICAL DATA:  Right elbow status post fall last night. Proximal right humeral head fracture. EXAM: RIGHT ELBOW - COMPLETE 3+ VIEW COMPARISON:  None Available. FINDINGS: Possible mild elevation of the distal anterior humeral fat pad, possible small elbow joint effusion. Minimal medial elbow joint space narrowing and peripheral degenerative spurring. No acute fracture is seen. No dislocation. Mild-to-moderate atherosclerotic calcifications. IMPRESSION: 1. Possible small elbow joint effusion. No acute fracture is identified. If there is persistent clinical concern for a radiographically occult fracture, note is made CT would be more sensitive. 2. Minimal medial elbow joint osteoarthritis. Electronically Signed   By: Neita Garnet M.D.   On: 04/20/2023 12:51   DG Clavicle Left  Result Date: 04/20/2023 CLINICAL DATA:  Left chest bruising after fall last night. EXAM: LEFT CLAVICLE - 2+ VIEWS COMPARISON:  AP chest 04/21/2021 FINDINGS: There is vertical, obliqued linear fracture line lucency within the lateral shaft and adjacent head of the left clavicle. No significant displacement. The clavicular joint remains appropriately aligned. Mild glenohumeral joint space narrowing with moderate inferior glenoid degenerative osteophytosis. Mild  atherosclerotic calcification within the aortic arch. Old healed fracture of the posterolateral left seventh rib. IMPRESSION: Nondisplaced acute fracture of the lateral shaft and head of the left clavicle. Electronically Signed   By: Neita Garnet M.D.   On: 04/20/2023 11:30   DG Shoulder Right  Result Date: 04/20/2023 CLINICAL DATA:  Right shoulder pain. Right arm pain starting last night when fell. EXAM: RIGHT SHOULDER - 2+ VIEW COMPARISON:  AP chest 04/21/2021 FINDINGS: There is mildly decreased  bone mineralization. There is an acute, moderately impacted, and highly comminuted fracture involving the lateral humeral head and the humeral surgical neck. There is high-grade comminution at the inferolateral aspect of the humeral head. Up to approximately 6 mm anterior displacement of the distal fracture component with respect to the proximal fracture component. The humeral head remains appropriately located with respect to the glenoid fossa. Mild inferior glenoid degenerative osteophytosis. Moderate lateral downsloping of the acromion. IMPRESSION: Acute, moderately impacted, and highly comminuted fracture involving the lateral humeral head and the humeral surgical neck. Electronically Signed   By: Neita Garnet M.D.   On: 04/20/2023 11:28   CT Head Wo Contrast  Result Date: 04/20/2023 CLINICAL DATA:  ams, etoh use EXAM: CT HEAD WITHOUT CONTRAST TECHNIQUE: Contiguous axial images were obtained from the base of the skull through the vertex without intravenous contrast. RADIATION DOSE REDUCTION: This exam was performed according to the departmental dose-optimization program which includes automated exposure control, adjustment of the mA and/or kV according to patient size and/or use of iterative reconstruction technique. COMPARISON:  None Available. FINDINGS: Brain: No evidence of acute infarction, hemorrhage, hydrocephalus, extra-axial collection or mass lesion/mass effect. Sequela of moderate chronic microvascular ischemic change. Mineralization of the basal ganglia on the right. Vascular: No hyperdense vessel or unexpected calcification. Skull: Normal. Negative for fracture or focal lesion. Sinuses/Orbits: No middle ear or mastoid effusion. Paranasal sinuses are clear. Left lens replacement. Orbits are otherwise unremarkable. Other: None. IMPRESSION: No acute intracranial abnormality. Electronically Signed   By: Lorenza Cambridge M.D.   On: 04/20/2023 11:25    Assessment  67 y.o. female with a history of  alcohol abuse, HTN, HLD, and hypothyroidism who presented to the hospital after episode of syncope and found to have worsening anemia and heme positive stool.  Symptomatic Anemia: Presented with Hgb 8.6 which was down from 10.1 a few days prior. She had further drop to 7.6 but has not required transfusion.   EGD with grade C reflux esophagitis with biopsy confirmation of reflux changes and benign esophageal stenosis, few nodules in the stomach revealed to be hyperplastic and normal duodenum. Advised to consider outpatient EGD for removal of other small polyps. 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 No melena noted on BM observed today. Last colonoscopy in December 2022 with internal hemorrhoids and 10 mm tubular adenoma in the descending colon. Hgb stable at 8.2 today and she denies any dizziness, lightheadedness, chest pain, or shortness of breath.   Prep has been failed multiple days given lack of assistance with prep (arm in sling and has clavicle fracture) and patient reporting she is unable to drink so much of prep at one time therefore given her need for outpatient EGD and no overt bleeding we will plan at another attempt for colonoscopy outpatient and frequent monitoring of hemoglobin.   Plan / Recommendations  Monitor for GI bleeding CBC next week Follow up OV in 2-3 weeks. Plan for outpatient  EGD and Colonoscopy given poor prep attempts. Creatinine stable therefore she may be a good candidate for low volume prep.     LOS: 7 days    05/01/2023, 10:03 AM   Brooke Bonito, MSN, FNP-BC, AGACNP-BC [facility]

## 2023-05-01 NOTE — TOC Transition Note (Signed)
Transition of Care [facility]) - CM/SW Discharge Note   Patient Details  Name: MASHANDA STEPHNEY MRN: [id number] Date of Birth: November 12, 1955  Transition of Care [facility]) CM/SW Contact:  Villa Herb, LCSWA Phone Number: 05/01/2023, 11:48 AM   Clinical Narrative:    CSW spoke to [place] with [facility] who states that they are able to accept pt for [facility] PT. CSW requested that MD place [facility] orders. CSW updated Clifton Custard that pt will discharge home today. TOC signing off.   Final next level of care: Home w Home Health Services Barriers to Discharge: Barriers Resolved   Patient Goals and CMS Choice CMS Medicare.gov Compare Post Acute Care list provided to:: Patient Choice offered to / list presented to : Patient  Discharge Placement                         Discharge Plan and Services Additional resources added to the After Visit Summary for   In-house Referral: Clinical Social Work Discharge Planning Services: CM Consult Post Acute Care Choice: Home Health                    HH Arranged: PT [facility] Agency: [facility] Date [facility] Agency Contacted: 05/01/23   Representative spoke with at [facility] Agency: Clifton Custard  Social Determinants of Health (SDOH) Interventions SDOH Screenings   Food Insecurity: No Food Insecurity (04/24/2023)  Housing: Low Risk  (04/24/2023)  Transportation Needs: No Transportation Needs (04/24/2023)  Utilities: Not At Risk (04/24/2023)  Tobacco Use: Low Risk  (04/27/2023)     Readmission Risk Interventions     No data to display

## 2023-05-01 NOTE — Care Management Important Message (Signed)
Important Message  Patient Details  Name: Judy Davis MRN: [id number] Date of Birth: 24-Apr-1956   Medicare Important Message Given:  Yes     Corey Harold 05/01/2023, 11:28 AM

## 2023-05-05 ENCOUNTER — Telehealth: Payer: Self-pay | Admitting: *Deleted

## 2023-05-05 NOTE — Telephone Encounter (Signed)
Tried to leave a message, voicemail not set up.

## 2023-05-06 DIAGNOSIS — K861 Other chronic pancreatitis: Secondary | ICD-10-CM | POA: Diagnosis not present

## 2023-05-06 DIAGNOSIS — E440 Moderate protein-calorie malnutrition: Secondary | ICD-10-CM | POA: Diagnosis not present

## 2023-05-06 DIAGNOSIS — S72022D Displaced fracture of epiphysis (separation) (upper) of left femur, subsequent encounter for closed fracture with routine healing: Secondary | ICD-10-CM | POA: Diagnosis not present

## 2023-05-06 DIAGNOSIS — I739 Peripheral vascular disease, unspecified: Secondary | ICD-10-CM | POA: Diagnosis not present

## 2023-05-06 DIAGNOSIS — I10 Essential (primary) hypertension: Secondary | ICD-10-CM | POA: Diagnosis not present

## 2023-05-06 DIAGNOSIS — S42002D Fracture of unspecified part of left clavicle, subsequent encounter for fracture with routine healing: Secondary | ICD-10-CM | POA: Diagnosis not present

## 2023-05-06 DIAGNOSIS — D649 Anemia, unspecified: Secondary | ICD-10-CM | POA: Diagnosis not present

## 2023-05-06 DIAGNOSIS — S42201D Unspecified fracture of upper end of right humerus, subsequent encounter for fracture with routine healing: Secondary | ICD-10-CM | POA: Diagnosis not present

## 2023-05-06 DIAGNOSIS — S42301D Unspecified fracture of shaft of humerus, right arm, subsequent encounter for fracture with routine healing: Secondary | ICD-10-CM | POA: Diagnosis not present

## 2023-05-13 NOTE — Telephone Encounter (Signed)
LMOM for pt to call office  

## 2023-05-15 DIAGNOSIS — K861 Other chronic pancreatitis: Secondary | ICD-10-CM | POA: Diagnosis not present

## 2023-05-15 DIAGNOSIS — S72022D Displaced fracture of epiphysis (separation) (upper) of left femur, subsequent encounter for closed fracture with routine healing: Secondary | ICD-10-CM | POA: Diagnosis not present

## 2023-05-15 DIAGNOSIS — E440 Moderate protein-calorie malnutrition: Secondary | ICD-10-CM | POA: Diagnosis not present

## 2023-05-15 DIAGNOSIS — I739 Peripheral vascular disease, unspecified: Secondary | ICD-10-CM | POA: Diagnosis not present

## 2023-05-15 DIAGNOSIS — S42201D Unspecified fracture of upper end of right humerus, subsequent encounter for fracture with routine healing: Secondary | ICD-10-CM | POA: Diagnosis not present

## 2023-05-15 DIAGNOSIS — I10 Essential (primary) hypertension: Secondary | ICD-10-CM | POA: Diagnosis not present

## 2023-05-15 DIAGNOSIS — S42301D Unspecified fracture of shaft of humerus, right arm, subsequent encounter for fracture with routine healing: Secondary | ICD-10-CM | POA: Diagnosis not present

## 2023-05-15 DIAGNOSIS — D649 Anemia, unspecified: Secondary | ICD-10-CM | POA: Diagnosis not present

## 2023-05-15 DIAGNOSIS — S42002D Fracture of unspecified part of left clavicle, subsequent encounter for fracture with routine healing: Secondary | ICD-10-CM | POA: Diagnosis not present

## 2023-05-20 ENCOUNTER — Other Ambulatory Visit: Payer: Self-pay | Admitting: *Deleted

## 2023-05-20 ENCOUNTER — Encounter: Payer: Self-pay | Admitting: *Deleted

## 2023-05-20 DIAGNOSIS — E440 Moderate protein-calorie malnutrition: Secondary | ICD-10-CM | POA: Diagnosis not present

## 2023-05-20 DIAGNOSIS — S42002D Fracture of unspecified part of left clavicle, subsequent encounter for fracture with routine healing: Secondary | ICD-10-CM | POA: Diagnosis not present

## 2023-05-20 DIAGNOSIS — D649 Anemia, unspecified: Secondary | ICD-10-CM

## 2023-05-20 DIAGNOSIS — K861 Other chronic pancreatitis: Secondary | ICD-10-CM | POA: Diagnosis not present

## 2023-05-20 DIAGNOSIS — S42201D Unspecified fracture of upper end of right humerus, subsequent encounter for fracture with routine healing: Secondary | ICD-10-CM | POA: Diagnosis not present

## 2023-05-20 DIAGNOSIS — I10 Essential (primary) hypertension: Secondary | ICD-10-CM | POA: Diagnosis not present

## 2023-05-20 DIAGNOSIS — I739 Peripheral vascular disease, unspecified: Secondary | ICD-10-CM | POA: Diagnosis not present

## 2023-05-20 DIAGNOSIS — S72022D Displaced fracture of epiphysis (separation) (upper) of left femur, subsequent encounter for closed fracture with routine healing: Secondary | ICD-10-CM | POA: Diagnosis not present

## 2023-05-20 DIAGNOSIS — S42301D Unspecified fracture of shaft of humerus, right arm, subsequent encounter for fracture with routine healing: Secondary | ICD-10-CM | POA: Diagnosis not present

## 2023-05-20 NOTE — Telephone Encounter (Signed)
Tried several times to reach pt. Mailbox is full. I mailed a letter.

## 2023-05-25 NOTE — Progress Notes (Deleted)
GI Office Note    Referring Provider: Tylene Fantasia., PA-C Primary Care Physician:  Tylene Fantasia., PA-C  Primary Gastroenterologist:  Chief Complaint   No chief complaint on file.   History of Present Illness   Judy Davis is a 67 y.o. female presenting today for hostpial follow up. Last seen in 04/2023. Admitted with syncope, worsening anemia, heme positive stool.   EGD during damission, LA Grade C reflux esophagtiis s/p bx (reflux changes), benign-appearing esophageal stenosis, few nodules in stomach s/p biopsy (hyperplastic), and normal duodenum. Consider outpatient EGD to remove other small hyperplastic polyps per Dr. Levon Hedger. 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 Notably, last colonoscopy Dec 2022: internal hemorrhoids, one 10 mm polyp in descendign colon. (Tubular adenoma).   Panned for inpatient colonoscopy but patient failed prepping muldiptle days due to lack of assistance with prep (arm in sling and has clavical fracture).     Medications   Current Outpatient Medications  Medication Sig Dispense Refill   levothyroxine (SYNTHROID) 50 MCG tablet Take 1 tablet (50 mcg total) by mouth daily before breakfast. 30 tablet 2   lisinopril (ZESTRIL) 5 MG tablet Take 1 tablet (5 mg total) by mouth daily. 30 tablet 3   Metoprolol Tartrate 75 MG TABS Take 1 tablet (75 mg total) by mouth in the morning and at bedtime. 60 tablet 4   Multiple Vitamin (MULTIVITAMIN WITH MINERALS) TABS tablet Take 1 tablet by mouth daily. 30 tablet 0   oxyCODONE (ROXICODONE) 5 MG immediate release tablet Take 0.5 tablets (2.5 mg total) by mouth every 4 (four) hours as needed for severe pain. 15 tablet 0   pantoprazole (PROTONIX) 40 MG tablet Take 1 tablet (40 mg total) by mouth 2 (two) times daily. 30 tablet 5   traZODone (DESYREL) 50 MG tablet Take 1 tablet (50 mg total) by  mouth at bedtime. 30 tablet 2   No current facility-administered medications for this visit.    Allergies   Allergies as of 05/26/2023   (No Known Allergies)     Past Medical History   Past Medical History:  Diagnosis Date   ETOH abuse    HTN (hypertension)    Hyperlipidemia    Hypothyroidism     Past Surgical History   Past Surgical History:  Procedure Laterality Date   ABDOMINAL HYSTERECTOMY  1970's   BIOPSY  04/26/2023   Procedure: BIOPSY;  Surgeon: Dolores Frame, MD;  [facility];  Service: Gastroenterology;;   COLONOSCOPY WITH PROPOFOL N/A 02/05/2021   Procedure: COLONOSCOPY WITH PROPOFOL;  Surgeon: Lanelle Bal, DO;  [facility]: [facility];  Service: Endoscopy;  Laterality: N/A;  AM   COLONOSCOPY WITH PROPOFOL N/A 08/19/2021   Procedure: COLONOSCOPY WITH PROPOFOL;  Surgeon: Lanelle Bal, DO;  [facility];  Service: Endoscopy;  Laterality: N/A;  9:00am   ESOPHAGOGASTRODUODENOSCOPY (EGD) WITH PROPOFOL N/A 08/09/2019   Fields: LA Grade C esophagitis. gastritis. no h.pylori   ESOPHAGOGASTRODUODENOSCOPY (EGD) WITH PROPOFOL N/A 04/26/2023   Procedure: ESOPHAGOGASTRODUODENOSCOPY (EGD) WITH PROPOFOL;  Surgeon: Dolores Frame, MD;  [facility];  Service: Gastroenterology;  Laterality: N/A;   GIVENS CAPSULE STUDY N/A 04/27/2023   Procedure: GIVENS CAPSULE STUDY;  Surgeon: Lanelle Bal, DO;  [facility];  Service: Endoscopy;  Laterality: N/A;   POLYPECTOMY  08/19/2021   Procedure: POLYPECTOMY;  Surgeon: Lanelle Bal,  DO;  [facility];  Service: Endoscopy;;   TOTAL HIP ARTHROPLASTY Left 04/23/2021   Procedure: LEFT TOTAL HIP ARTHROPLASTY ANTERIOR APPROACH;  Surgeon: Cammy Copa, MD;  Location: [facility];  Service: Orthopedics;  Laterality: Left;    Past Family History   Family History  Problem Relation Age of Onset   Other Mother        Homicide   Cancer Father         possibly pancreatic   Colon cancer Neg Hx    Colon polyps Neg Hx     Past Social History   Social History   Socioeconomic History   Marital status: Married    Spouse name: Not on file   Number of children: Not on file   Years of education: Not on file   Highest education level: Not on file  Occupational History   Occupation: unemployed  Tobacco Use   Smoking status: Never   Smokeless tobacco: Never  Vaping Use   Vaping status: Never Used  Substance and Sexual Activity   Alcohol use: Yes    Comment: occ beer   Drug use: No   Sexual activity: Not on file  Other Topics Concern   Not on file  Social History Narrative   Not on file   Social Determinants of Health   Financial Resource Strain: Not on file  Food Insecurity: No Food Insecurity (04/24/2023)   Hunger Vital Sign    Worried About Running Out of Food in the Last Year: Never true    Ran Out of Food in the Last Year: Never true  Transportation Needs: No Transportation Needs (04/24/2023)   PRAPARE - Administrator, Civil Service (Medical): No    Lack of Transportation (Non-Medical): No  Physical Activity: Not on file  Stress: Not on file  Social Connections: Not on file  Intimate Partner Violence: Not At Risk (04/24/2023)   Humiliation, Afraid, Rape, and Kick questionnaire    Fear of Current or Ex-Partner: No    Emotionally Abused: No    Physically Abused: No    Sexually Abused: No    Review of Systems   General: Negative for anorexia, weight loss, fever, chills, fatigue, weakness. ENT: Negative for hoarseness, difficulty swallowing , nasal congestion. CV: Negative for chest pain, angina, palpitations, dyspnea on exertion, peripheral edema.  Respiratory: Negative for dyspnea at rest, dyspnea on exertion, cough, sputum, wheezing.  GI: See history of present illness. GU:  Negative for dysuria, hematuria, urinary incontinence, urinary frequency, nocturnal urination.  Endo: Negative for unusual weight  change.     Physical Exam   There were no vitals taken for this visit.   General: Well-nourished, well-developed in no acute distress.  Eyes: No icterus. Mouth: Oropharyngeal mucosa moist and pink , no lesions erythema or exudate. Lungs: Clear to auscultation bilaterally.  Heart: Regular rate and rhythm, no murmurs rubs or gallops.  Abdomen: Bowel sounds are normal, nontender, nondistended, no hepatosplenomegaly or masses,  no abdominal bruits or hernia , no rebound or guarding.  Rectal: ***  Extremities: No lower extremity edema. No clubbing or deformities. Neuro: Alert and oriented x 4   Skin: Warm and dry, no jaundice.   Psych: Alert and cooperative, normal mood and affect.  Labs   *** Imaging Studies   No results found. Lab Results  Component Value Date   NA 130 (L) 05/01/2023   CL 99 05/01/2023   K 3.6 05/01/2023   CO2 23 05/01/2023  BUN 7 (L) 05/01/2023   CREATININE 0.75 05/01/2023   GFRNONAA >60 05/01/2023   CALCIUM 8.9 05/01/2023   PHOS 4.0 05/01/2023   ALBUMIN 3.3 (L) 05/01/2023   GLUCOSE 101 (H) 05/01/2023   Lab Results  Component Value Date   ALT 16 04/24/2023   AST 29 04/24/2023   ALKPHOS 61 04/24/2023   BILITOT 1.1 04/24/2023   Lab Results  Component Value Date   WBC 7.0 05/01/2023   HGB 8.2 (L) 05/01/2023   HCT 25.8 (L) 05/01/2023   MCV 93.5 05/01/2023   PLT 407 (H) 05/01/2023   Lab Results  Component Value Date   IRON 32 04/24/2023   TIBC 321 04/24/2023   FERRITIN 154 04/24/2023   Lab Results  Component Value Date   VITAMINB12 1,034 (H) 04/25/2023   Lab Results  Component Value Date   FOLATE 23.0 04/25/2023    Assessment       PLAN   ***   Verlon Au S. Melvyn Neth, MHS, PA-C [facility]

## 2023-05-26 ENCOUNTER — Ambulatory Visit: Payer: Medicare HMO | Admitting: Gastroenterology

## 2023-05-26 ENCOUNTER — Encounter: Payer: Self-pay | Admitting: Gastroenterology

## 2023-05-26 DIAGNOSIS — E871 Hypo-osmolality and hyponatremia: Secondary | ICD-10-CM | POA: Diagnosis not present

## 2023-05-26 DIAGNOSIS — S42211D Unspecified displaced fracture of surgical neck of right humerus, subsequent encounter for fracture with routine healing: Secondary | ICD-10-CM | POA: Diagnosis not present

## 2023-05-26 DIAGNOSIS — S42025A Nondisplaced fracture of shaft of left clavicle, initial encounter for closed fracture: Secondary | ICD-10-CM | POA: Diagnosis not present

## 2023-05-26 DIAGNOSIS — F1021 Alcohol dependence, in remission: Secondary | ICD-10-CM | POA: Diagnosis not present

## 2023-05-28 DIAGNOSIS — S42301D Unspecified fracture of shaft of humerus, right arm, subsequent encounter for fracture with routine healing: Secondary | ICD-10-CM | POA: Diagnosis not present

## 2023-05-28 DIAGNOSIS — D649 Anemia, unspecified: Secondary | ICD-10-CM | POA: Diagnosis not present

## 2023-05-28 DIAGNOSIS — K861 Other chronic pancreatitis: Secondary | ICD-10-CM | POA: Diagnosis not present

## 2023-05-28 DIAGNOSIS — S42201D Unspecified fracture of upper end of right humerus, subsequent encounter for fracture with routine healing: Secondary | ICD-10-CM | POA: Diagnosis not present

## 2023-05-28 DIAGNOSIS — S72022D Displaced fracture of epiphysis (separation) (upper) of left femur, subsequent encounter for closed fracture with routine healing: Secondary | ICD-10-CM | POA: Diagnosis not present

## 2023-05-28 DIAGNOSIS — I739 Peripheral vascular disease, unspecified: Secondary | ICD-10-CM | POA: Diagnosis not present

## 2023-05-28 DIAGNOSIS — S42002D Fracture of unspecified part of left clavicle, subsequent encounter for fracture with routine healing: Secondary | ICD-10-CM | POA: Diagnosis not present

## 2023-05-28 DIAGNOSIS — E440 Moderate protein-calorie malnutrition: Secondary | ICD-10-CM | POA: Diagnosis not present

## 2023-05-28 DIAGNOSIS — I10 Essential (primary) hypertension: Secondary | ICD-10-CM | POA: Diagnosis not present

## 2023-06-01 DIAGNOSIS — D649 Anemia, unspecified: Secondary | ICD-10-CM | POA: Diagnosis not present

## 2023-06-02 LAB — CBC WITH DIFFERENTIAL/PLATELET
Basophils Absolute: 0.1 10*3/uL (ref 0.0–0.2)
Basos: 1 %
EOS (ABSOLUTE): 0.1 10*3/uL (ref 0.0–0.4)
Eos: 2 %
Hematocrit: 30.7 % — ABNORMAL LOW (ref 34.0–46.6)
Hemoglobin: 9.8 g/dL — ABNORMAL LOW (ref 11.1–15.9)
Immature Grans (Abs): 0 10*3/uL (ref 0.0–0.1)
Immature Granulocytes: 0 %
Lymphocytes Absolute: 2.3 10*3/uL (ref 0.7–3.1)
Lymphs: 28 %
MCH: 27.6 pg (ref 26.6–33.0)
MCHC: 31.9 g/dL (ref 31.5–35.7)
MCV: 87 fL (ref 79–97)
Monocytes Absolute: 0.7 10*3/uL (ref 0.1–0.9)
Monocytes: 9 %
Neutrophils Absolute: 4.9 10*3/uL (ref 1.4–7.0)
Neutrophils: 60 %
Platelets: 427 10*3/uL (ref 150–450)
RBC: 3.55 x10E6/uL — ABNORMAL LOW (ref 3.77–5.28)
RDW: 11.9 % (ref 11.7–15.4)
WBC: 8.1 10*3/uL (ref 3.4–10.8)

## 2023-06-08 ENCOUNTER — Ambulatory Visit: Payer: Medicare HMO | Admitting: Orthopedic Surgery

## 2023-06-23 ENCOUNTER — Encounter: Payer: Self-pay | Admitting: Gastroenterology

## 2023-06-23 ENCOUNTER — Ambulatory Visit (INDEPENDENT_AMBULATORY_CARE_PROVIDER_SITE_OTHER): Payer: 59 | Admitting: Gastroenterology

## 2023-06-23 VITALS — BP 178/87 | HR 65 | Temp 97.7°F | Ht 67.0 in | Wt 125.6 lb

## 2023-06-23 DIAGNOSIS — R195 Other fecal abnormalities: Secondary | ICD-10-CM | POA: Diagnosis not present

## 2023-06-23 DIAGNOSIS — K209 Esophagitis, unspecified without bleeding: Secondary | ICD-10-CM

## 2023-06-23 DIAGNOSIS — D649 Anemia, unspecified: Secondary | ICD-10-CM | POA: Diagnosis not present

## 2023-06-23 NOTE — Progress Notes (Signed)
GI Office Note    Referring Provider: Marylynn Pearson, FNP Primary Care Physician:  Marylynn Pearson, FNP  Primary Gastroenterologist: Hennie Duos. Marletta Lor, DO   Chief Complaint   Chief Complaint  Patient presents with   Follow-up    History of Present Illness   Judy Davis is a 67 y.o. female presenting today for hospital follow up. Seen in 04/2023 while inpatient with worsening anemia, heme positive stool.   Hgb on admission 8.6, down from 10.1 several days prior. She had further drop to 7.6 but did not require transfusion. History of chronic normocytic anemia. EGD during admission with LA Grade C reflux esophagitis s/p bx (reflux changes), benign appearing esophageal stenosis, few nodule in stomach s/p bx (hyperplastic) and normal duodenum. Per Dr. Levon Hedger, consider outpatient EGD for removal of other small hyperplastic polyps. 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 Last colonoscopy 08/2021, 10mm tubular adenoma removed. We had planned for colonoscopy while inpatient but she did not complete bowel prep (complicated by lack of assistance and she had one arm in sling and the other with clavicle fracture).   She is feeling better today. She states she has had no etoh since admitted. States her husband is no longer drinking. BMs regular. No heartburn, dysphagia, melena, brbpr, abdominal pain.    TCS 08/2021 (history of poor bowel prep requiring repeat exam) -non-bleeding internal hemorrhoids -diverticulosis in sigmoid colon -one 10 mm polyp in descending colon, removed, tubular adenoma -colonoscopy in 5 years  EGD 04/2023: -LA Grade C reflux esophagitis with no bleeding s/p bx with mild reflux changes -benign-appearing esophageal stenosis -3cm hh -few mucosal papules in stomach s/p bx, hyperplastic polyps -Pathology showed small hyperplastic nodules in the stomach, esophageal biopsies with  reflux changes, may consider repeat EGD as outpatient to remove other small hyperplastic polyps   Small bowel capsule study 04/2023: Capsule endoscopy completed 8/12 with 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  Her hemoglobin has trended down to 7.6 this morning though this may be dilutional. She has had no further overt bleeding since admission.     Medications   Current Outpatient Medications  Medication Sig Dispense Refill   levothyroxine (SYNTHROID) 50 MCG tablet Take 1 tablet (50 mcg total) by mouth daily before breakfast. 30 tablet 2   lisinopril (ZESTRIL) 5 MG tablet Take 1 tablet (5 mg total) by mouth daily. 30 tablet 3   Metoprolol Tartrate 75 MG TABS Take 1 tablet (75 mg total) by mouth in the morning and at bedtime. 60 tablet 4   Multiple Vitamin (MULTIVITAMIN WITH MINERALS) TABS tablet Take 1 tablet by mouth daily. 30 tablet 0   pantoprazole (PROTONIX) 40 MG tablet Take 1 tablet (40 mg total) by mouth 2 (two) times daily. 30 tablet 5   traZODone (DESYREL) 50 MG tablet Take 1 tablet (50 mg total) by mouth at bedtime. 30 tablet 2   No current facility-administered medications for this visit.    Allergies   Allergies as of 06/23/2023   (No Known Allergies)     Review of Systems   General: Negative for anorexia, weight loss, fever, chills, fatigue, weakness. ENT: Negative for hoarseness, difficulty swallowing , nasal congestion. CV: Negative for chest pain, angina, palpitations, dyspnea on exertion, peripheral edema.  Respiratory: Negative for dyspnea at rest, dyspnea on exertion, cough, sputum, wheezing.  GI: See history of present illness. GU:  Negative for dysuria, hematuria, urinary incontinence, urinary frequency, nocturnal urination.  Endo: Negative for unusual weight change.     Physical Exam   BP (!) 178/87 (BP Location: Left Arm, Patient Position: Sitting, Cuff Size: Normal)   Pulse  65   Temp 97.7 F (36.5 C) (Oral)   Ht 5\' 7"  (1.702 m)   Wt 125 lb 9.6 oz (57 kg)   SpO2 98%   BMI 19.67 kg/m    General: Well-nourished, well-developed in no acute distress.  Eyes: No icterus. Mouth: Oropharyngeal mucosa moist and pink   Lungs: Clear to auscultation bilaterally.  Heart: Regular rate and rhythm, no murmurs rubs or gallops.  Abdomen: Bowel sounds are normal, nontender, nondistended, no hepatosplenomegaly or masses,  no abdominal bruits or hernia , no rebound or guarding.  Rectal: not performed  Extremities: No lower extremity edema. No clubbing or deformities. Neuro: Alert and oriented x 4   Skin: Warm and dry, no jaundice.   Psych: Alert and cooperative, normal mood and affect.  Labs   Lab Results  Component Value Date   NA 130 (L) 05/01/2023   CL 99 05/01/2023   K 3.6 05/01/2023   CO2 23 05/01/2023   BUN 7 (L) 05/01/2023   CREATININE 0.75 05/01/2023   GFRNONAA >60 05/01/2023   CALCIUM 8.9 05/01/2023   PHOS 4.0 05/01/2023   ALBUMIN 3.3 (L) 05/01/2023   GLUCOSE 101 (H) 05/01/2023   Lab Results  Component Value Date   ALT 16 04/24/2023   AST 29 04/24/2023   ALKPHOS 61 04/24/2023   BILITOT 1.1 04/24/2023   Lab Results  Component Value Date   WBC 8.1 06/01/2023   HGB 9.8 (L) 06/01/2023   HCT 30.7 (L) 06/01/2023   MCV 87 06/01/2023   PLT 427 06/01/2023   Lab Results  Component Value Date   IRON 32 04/24/2023   TIBC 321 04/24/2023   FERRITIN 154 04/24/2023   Lab Results  Component Value Date   VITAMINB12 1,034 (H) 04/25/2023   Lab Results  Component Value Date   FOLATE 23.0 04/25/2023    Imaging Studies   No results found.  Assessment/Plan   Normocytic anemia/hemoccult positive stool -no overt GI bleeding -Hgb 9.8 on 06/01/23 -she had LA Grade C esophagitis but otherwise nothing to explain degree of anemia on her EGD or small bowel capsule study -consider updating colonoscopy and EGD to remove additional hyperplastic appearing  polyps (per Dr. Levon Hedger).  -we will update labs first, then to discuss plan with Dr. Marletta Lor.  -patient did not bring her medication list. We have requested she compare our current list with medicaitons at home and let us know of discrepancies. She may have refills at the pharmacy, staff to contact/assist.    The patient was found to have elevated blood pressure when vital signs were checked in the office. The blood pressure was rechecked by the nursing staff and it was found be persistently elevated >140/90 mmHg. I personally advised to the patient to follow up closely with his PCP for hypertension control.   Leanna Battles. Melvyn Neth, MHS, PA-C [facility]

## 2023-06-23 NOTE — Patient Instructions (Signed)
Please update labs in 2 weeks.  Compare your medication list at home with what is on your handout today. You need to make sure you have all of your medication. We will call pharmacy and see if you have refills on any available.

## 2023-07-03 ENCOUNTER — Encounter: Payer: Self-pay | Admitting: Gastroenterology

## 2023-07-24 ENCOUNTER — Telehealth: Payer: Self-pay | Admitting: Gastroenterology

## 2023-07-24 NOTE — Telephone Encounter (Signed)
Can we remind patient to get labs done?

## 2023-07-27 NOTE — Telephone Encounter (Signed)
No ans, vm not set up.

## 2023-07-29 LAB — IRON,TIBC AND FERRITIN PANEL
Ferritin: 48 ng/mL (ref 15–150)
Iron Saturation: 31 % (ref 15–55)
Iron: 112 ug/dL (ref 27–139)
Total Iron Binding Capacity: 362 ug/dL (ref 250–450)
UIBC: 250 ug/dL (ref 118–369)

## 2023-07-29 LAB — CBC WITH DIFFERENTIAL/PLATELET
Basophils Absolute: 0 10*3/uL (ref 0.0–0.2)
Basos: 1 %
EOS (ABSOLUTE): 0.1 10*3/uL (ref 0.0–0.4)
Eos: 2 %
Hematocrit: 42.1 % (ref 34.0–46.6)
Hemoglobin: 13.6 g/dL (ref 11.1–15.9)
Immature Grans (Abs): 0 10*3/uL (ref 0.0–0.1)
Immature Granulocytes: 0 %
Lymphocytes Absolute: 2 10*3/uL (ref 0.7–3.1)
Lymphs: 32 %
MCH: 28.3 pg (ref 26.6–33.0)
MCHC: 32.3 g/dL (ref 31.5–35.7)
MCV: 88 fL (ref 79–97)
Monocytes Absolute: 0.5 10*3/uL (ref 0.1–0.9)
Monocytes: 8 %
Neutrophils Absolute: 3.7 10*3/uL (ref 1.4–7.0)
Neutrophils: 57 %
Platelets: 420 10*3/uL (ref 150–450)
RBC: 4.8 x10E6/uL (ref 3.77–5.28)
RDW: 13.1 % (ref 11.7–15.4)
WBC: 6.4 10*3/uL (ref 3.4–10.8)

## 2023-08-03 NOTE — Telephone Encounter (Signed)
Labs were done, see result note

## 2023-08-03 NOTE — Telephone Encounter (Signed)
No ans, vm not set up.

## 2024-07-20 ENCOUNTER — Encounter (INDEPENDENT_AMBULATORY_CARE_PROVIDER_SITE_OTHER): Payer: Self-pay | Admitting: Gastroenterology
